# Patient Record
Sex: Female | Born: 1980 | Race: White | Hispanic: No | Marital: Single | Smoking: Current some day smoker
Health system: Southern US, Community
[De-identification: ages and names within clinical notes are randomized; demographics above are authoritative.]

## PROBLEM LIST (undated history)

## (undated) DIAGNOSIS — K219 Gastro-esophageal reflux disease without esophagitis: Secondary | ICD-10-CM

## (undated) DIAGNOSIS — F419 Anxiety disorder, unspecified: Secondary | ICD-10-CM

## (undated) DIAGNOSIS — F32A Depression, unspecified: Secondary | ICD-10-CM

## (undated) DIAGNOSIS — J45909 Unspecified asthma, uncomplicated: Secondary | ICD-10-CM

## (undated) HISTORY — DX: Depression, unspecified: F32.A

## (undated) HISTORY — DX: Anxiety disorder, unspecified: F41.9

---

## 2006-05-31 ENCOUNTER — Emergency Department (HOSPITAL_COMMUNITY): Admission: EM | Admit: 2006-05-31 | Discharge: 2006-05-31 | Payer: Self-pay | Admitting: Emergency Medicine

## 2006-12-09 ENCOUNTER — Other Ambulatory Visit: Admission: RE | Admit: 2006-12-09 | Discharge: 2006-12-09 | Payer: Self-pay | Admitting: *Deleted

## 2008-06-26 ENCOUNTER — Emergency Department (HOSPITAL_COMMUNITY): Admission: EM | Admit: 2008-06-26 | Discharge: 2008-06-26 | Payer: Self-pay | Admitting: Emergency Medicine

## 2008-07-25 ENCOUNTER — Ambulatory Visit (HOSPITAL_COMMUNITY): Admission: RE | Admit: 2008-07-25 | Discharge: 2008-07-25 | Payer: Self-pay | Admitting: Chiropractic Medicine

## 2008-08-27 ENCOUNTER — Inpatient Hospital Stay (HOSPITAL_COMMUNITY): Admission: AD | Admit: 2008-08-27 | Discharge: 2008-08-27 | Payer: Self-pay | Admitting: Obstetrics & Gynecology

## 2008-08-29 ENCOUNTER — Inpatient Hospital Stay (HOSPITAL_COMMUNITY): Admission: AD | Admit: 2008-08-29 | Discharge: 2008-08-29 | Payer: Self-pay | Admitting: Obstetrics & Gynecology

## 2008-09-03 ENCOUNTER — Inpatient Hospital Stay (HOSPITAL_COMMUNITY): Admission: RE | Admit: 2008-09-03 | Discharge: 2008-09-03 | Payer: Self-pay | Admitting: Obstetrics & Gynecology

## 2008-09-25 ENCOUNTER — Emergency Department (HOSPITAL_COMMUNITY): Admission: EM | Admit: 2008-09-25 | Discharge: 2008-09-25 | Payer: Self-pay | Admitting: Emergency Medicine

## 2008-12-13 ENCOUNTER — Observation Stay (HOSPITAL_COMMUNITY): Admission: EM | Admit: 2008-12-13 | Discharge: 2008-12-14 | Payer: Self-pay | Admitting: Emergency Medicine

## 2008-12-13 ENCOUNTER — Ambulatory Visit: Payer: Self-pay | Admitting: Family Medicine

## 2008-12-13 ENCOUNTER — Encounter: Payer: Self-pay | Admitting: Internal Medicine

## 2008-12-13 LAB — CONVERTED CEMR LAB
BUN: 11 mg/dL
Basophils Relative: 0.2 %
CO2: 25 meq/L
Calcium: 8.6 mg/dL
Chloride: 104 meq/L
Creatinine, Ser: 0.55 mg/dL
Eosinophils Relative: 1.5 %
Glucose, Bld: 78 mg/dL
HCT: 32.7 %
Hemoglobin: 11.1 g/dL
Lymphocytes, automated: 3.6 %
MCV: 92.7 fL
Monocytes Relative: 0.8 %
Neutrophils Relative %: 12.9 %
Platelets: 310 10*3/uL
Potassium: 4.1 meq/L
RBC: 3.53 M/uL
RDW: 12.7 %
Sodium: 133 meq/L
WBC: 19.1 10*3/uL

## 2008-12-14 ENCOUNTER — Encounter: Payer: Self-pay | Admitting: Internal Medicine

## 2008-12-14 LAB — CONVERTED CEMR LAB
BUN: 3 mg/dL
CO2: 21 meq/L
Calcium: 8.4 mg/dL
Chloride: 109 meq/L
Creatinine, Ser: 0.49 mg/dL
Glucose, Bld: 153 mg/dL
HCT: 29.7 %
Hemoglobin: 10.1 g/dL
MCV: 92.4 fL
Platelets: 241 10*3/uL
Potassium: 3.7 meq/L
RBC: 3.22 M/uL
RDW: 12.7 %
Sodium: 136 meq/L
WBC: 20.8 10*3/uL

## 2009-01-02 ENCOUNTER — Encounter: Payer: Self-pay | Admitting: Internal Medicine

## 2009-01-02 DIAGNOSIS — M545 Low back pain, unspecified: Secondary | ICD-10-CM | POA: Insufficient documentation

## 2009-01-02 DIAGNOSIS — J45909 Unspecified asthma, uncomplicated: Secondary | ICD-10-CM | POA: Insufficient documentation

## 2009-03-16 ENCOUNTER — Inpatient Hospital Stay (HOSPITAL_COMMUNITY): Admission: AD | Admit: 2009-03-16 | Discharge: 2009-03-17 | Payer: Self-pay | Admitting: Obstetrics & Gynecology

## 2009-03-16 ENCOUNTER — Encounter: Payer: Self-pay | Admitting: Internal Medicine

## 2009-03-16 LAB — CONVERTED CEMR LAB
ALT: 15 units/L
AST: 21 units/L
Albumin: 2.6 g/dL
Alkaline Phosphatase: 74 units/L
BUN: 5 mg/dL
CO2: 22 meq/L
Calcium: 8.2 mg/dL
Chloride: 107 meq/L
Creatinine, Ser: 0.47 mg/dL
Glucose, Bld: 75 mg/dL
HCT: 31.2 %
Hemoglobin: 10.6 g/dL
MCV: 92.4 fL
Platelets: 159 10*3/uL
Potassium: 3.4 meq/L
RBC: 3.38 M/uL
RDW: 13.7 %
Sodium: 136 meq/L
Total Bilirubin: 0.2 mg/dL
Total Protein: 5.4 g/dL
WBC: 9.8 10*3/uL

## 2009-03-22 ENCOUNTER — Encounter: Payer: Self-pay | Admitting: Internal Medicine

## 2009-03-22 DIAGNOSIS — F411 Generalized anxiety disorder: Secondary | ICD-10-CM | POA: Insufficient documentation

## 2009-03-25 ENCOUNTER — Ambulatory Visit: Payer: Self-pay | Admitting: Internal Medicine

## 2009-04-08 ENCOUNTER — Inpatient Hospital Stay (HOSPITAL_COMMUNITY): Admission: AD | Admit: 2009-04-08 | Discharge: 2009-04-12 | Payer: Self-pay | Admitting: Obstetrics and Gynecology

## 2009-04-15 ENCOUNTER — Ambulatory Visit: Payer: Self-pay | Admitting: Internal Medicine

## 2009-04-15 ENCOUNTER — Telehealth: Payer: Self-pay | Admitting: Internal Medicine

## 2009-07-02 ENCOUNTER — Ambulatory Visit (HOSPITAL_COMMUNITY): Admission: RE | Admit: 2009-07-02 | Discharge: 2009-07-02 | Payer: Self-pay | Admitting: Obstetrics and Gynecology

## 2010-01-21 ENCOUNTER — Emergency Department (HOSPITAL_COMMUNITY): Admission: EM | Admit: 2010-01-21 | Discharge: 2010-01-22 | Payer: Self-pay | Admitting: Emergency Medicine

## 2010-05-13 NOTE — Progress Notes (Signed)
Summary: NO-SHOW X'3  ---- Converted from flag ---- ---- 04/15/2009 2:26 PM, Hilarie Fredrickson wrote: I NOTED IT IN [facility] (PO6 SCREEN) NOT TO SCHEDULE IN PRIMARY CARE.    ---- 04/15/2009 1:57 PM, Newt Lukes MD wrote: i don't know what the rule is but....  looks to me like this pt will be no show x 3 different days if she doens't make it here soon -  let's not reschedule her again, ok? - thanks ------------------------------

## 2010-07-14 LAB — CBC
HCT: 28.2 % — ABNORMAL LOW (ref 36.0–46.0)
HCT: 33.9 % — ABNORMAL LOW (ref 36.0–46.0)
Hemoglobin: 11.3 g/dL — ABNORMAL LOW (ref 12.0–15.0)
Hemoglobin: 9.6 g/dL — ABNORMAL LOW (ref 12.0–15.0)
MCHC: 33.3 g/dL (ref 30.0–36.0)
MCHC: 34 g/dL (ref 30.0–36.0)
MCV: 91.4 fL (ref 78.0–100.0)
MCV: 92.2 fL (ref 78.0–100.0)
Platelets: 152 10*3/uL (ref 150–400)
Platelets: 197 10*3/uL (ref 150–400)
RBC: 3.06 MIL/uL — ABNORMAL LOW (ref 3.87–5.11)
RBC: 3.71 MIL/uL — ABNORMAL LOW (ref 3.87–5.11)
RDW: 13.2 % (ref 11.5–15.5)
RDW: 13.4 % (ref 11.5–15.5)
WBC: 11 10*3/uL — ABNORMAL HIGH (ref 4.0–10.5)
WBC: 12.3 10*3/uL — ABNORMAL HIGH (ref 4.0–10.5)

## 2010-07-14 LAB — RPR: RPR Ser Ql: NONREACTIVE

## 2010-07-15 LAB — CBC
HCT: 31.2 % — ABNORMAL LOW (ref 36.0–46.0)
Hemoglobin: 10.6 g/dL — ABNORMAL LOW (ref 12.0–15.0)
MCHC: 33.8 g/dL (ref 30.0–36.0)
MCV: 92.4 fL (ref 78.0–100.0)
Platelets: 159 10*3/uL (ref 150–400)
RBC: 3.38 MIL/uL — ABNORMAL LOW (ref 3.87–5.11)
RDW: 13.7 % (ref 11.5–15.5)
WBC: 9.8 10*3/uL (ref 4.0–10.5)

## 2010-07-15 LAB — WET PREP, GENITAL
Clue Cells Wet Prep HPF POC: NONE SEEN
Trich, Wet Prep: NONE SEEN
Yeast Wet Prep HPF POC: NONE SEEN

## 2010-07-15 LAB — COMPREHENSIVE METABOLIC PANEL
ALT: 15 U/L (ref 0–35)
AST: 21 U/L (ref 0–37)
Albumin: 2.6 g/dL — ABNORMAL LOW (ref 3.5–5.2)
Alkaline Phosphatase: 74 U/L (ref 39–117)
BUN: 5 mg/dL — ABNORMAL LOW (ref 6–23)
CO2: 22 mEq/L (ref 19–32)
Calcium: 8.2 mg/dL — ABNORMAL LOW (ref 8.4–10.5)
Chloride: 107 mEq/L (ref 96–112)
Creatinine, Ser: 0.47 mg/dL (ref 0.4–1.2)
GFR calc Af Amer: >60 mL/min (ref >60)
GFR calc non Af Amer: >60 mL/min (ref >60)
Glucose, Bld: 75 mg/dL (ref 70–99)
Potassium: 3.4 mEq/L — ABNORMAL LOW (ref 3.5–5.1)
Sodium: 136 mEq/L (ref 135–145)
Total Bilirubin: 0.2 mg/dL — ABNORMAL LOW (ref 0.3–1.2)
Total Protein: 5.4 g/dL — ABNORMAL LOW (ref 6.0–8.3)

## 2010-07-15 LAB — URINALYSIS, ROUTINE W REFLEX MICROSCOPIC
Bilirubin Urine: NEGATIVE
Glucose, UA: NEGATIVE mg/dL
Hgb urine dipstick: NEGATIVE
Ketones, ur: NEGATIVE mg/dL
Nitrite: NEGATIVE
Protein, ur: NEGATIVE mg/dL
Specific Gravity, Urine: 1.015 (ref 1.005–1.030)
Urobilinogen, UA: 0.2 mg/dL (ref 0.0–1.0)
pH: 7 (ref 5.0–8.0)

## 2010-07-15 LAB — LACTATE DEHYDROGENASE: LDH: 151 U/L (ref 94–250)

## 2010-07-15 LAB — URINE CULTURE
Colony Count: NO GROWTH
Culture: NO GROWTH

## 2010-07-15 LAB — URINE MICROSCOPIC-ADD ON: RBC / HPF: NONE SEEN RBC/hpf (ref <3)

## 2010-07-15 LAB — URIC ACID: Uric Acid, Serum: 4.9 mg/dL (ref 2.4–7.0)

## 2010-07-18 LAB — CBC
HCT: 29.7 % — ABNORMAL LOW (ref 36.0–46.0)
HCT: 32.7 % — ABNORMAL LOW (ref 36.0–46.0)
Hemoglobin: 10.1 g/dL — ABNORMAL LOW (ref 12.0–15.0)
Hemoglobin: 11.1 g/dL — ABNORMAL LOW (ref 12.0–15.0)
MCHC: 33.8 g/dL (ref 30.0–36.0)
MCHC: 34 g/dL (ref 30.0–36.0)
MCV: 92.4 fL (ref 78.0–100.0)
MCV: 92.7 fL (ref 78.0–100.0)
Platelets: 241 10*3/uL (ref 150–400)
Platelets: 310 10*3/uL (ref 150–400)
RBC: 3.22 MIL/uL — ABNORMAL LOW (ref 3.87–5.11)
RBC: 3.53 MIL/uL — ABNORMAL LOW (ref 3.87–5.11)
RDW: 12.7 % (ref 11.5–15.5)
RDW: 12.7 % (ref 11.5–15.5)
WBC: 19.1 10*3/uL — ABNORMAL HIGH (ref 4.0–10.5)
WBC: 20.8 10*3/uL — ABNORMAL HIGH (ref 4.0–10.5)

## 2010-07-18 LAB — DIFFERENTIAL
Basophils Absolute: 0.2 10*3/uL — ABNORMAL HIGH (ref 0.0–0.1)
Basophils Relative: 1 % (ref 0–1)
Eosinophils Absolute: 1.5 10*3/uL — ABNORMAL HIGH (ref 0.0–0.7)
Eosinophils Relative: 8 % — ABNORMAL HIGH (ref 0–5)
Lymphocytes Relative: 19 % (ref 12–46)
Lymphs Abs: 3.6 10*3/uL (ref 0.7–4.0)
Monocytes Absolute: 0.8 10*3/uL (ref 0.1–1.0)
Monocytes Relative: 4 % (ref 3–12)
Neutro Abs: 12.9 10*3/uL — ABNORMAL HIGH (ref 1.7–7.7)
Neutrophils Relative %: 68 % (ref 43–77)

## 2010-07-18 LAB — BASIC METABOLIC PANEL
BUN: 11 mg/dL (ref 6–23)
BUN: 3 mg/dL — ABNORMAL LOW (ref 6–23)
CO2: 21 mEq/L (ref 19–32)
CO2: 25 mEq/L (ref 19–32)
Calcium: 8.4 mg/dL (ref 8.4–10.5)
Calcium: 8.6 mg/dL (ref 8.4–10.5)
Chloride: 104 mEq/L (ref 96–112)
Chloride: 109 mEq/L (ref 96–112)
Creatinine, Ser: 0.49 mg/dL (ref 0.4–1.2)
Creatinine, Ser: 0.55 mg/dL (ref 0.4–1.2)
GFR calc Af Amer: >60 mL/min (ref >60)
GFR calc Af Amer: >60 mL/min (ref >60)
GFR calc non Af Amer: >60 mL/min (ref >60)
GFR calc non Af Amer: >60 mL/min (ref >60)
Glucose, Bld: 153 mg/dL — ABNORMAL HIGH (ref 70–99)
Glucose, Bld: 78 mg/dL (ref 70–99)
Potassium: 3.7 mEq/L (ref 3.5–5.1)
Potassium: 4.1 mEq/L (ref 3.5–5.1)
Sodium: 133 mEq/L — ABNORMAL LOW (ref 135–145)
Sodium: 136 mEq/L (ref 135–145)

## 2010-07-22 LAB — CBC
HCT: 36.9 % (ref 36.0–46.0)
Hemoglobin: 13 g/dL (ref 12.0–15.0)
MCHC: 35.2 g/dL (ref 30.0–36.0)
MCV: 92.2 fL (ref 78.0–100.0)
Platelets: 259 10*3/uL (ref 150–400)
RBC: 4 MIL/uL (ref 3.87–5.11)
RDW: 12.2 % (ref 11.5–15.5)
WBC: 9.4 10*3/uL (ref 4.0–10.5)

## 2010-07-22 LAB — URINALYSIS, ROUTINE W REFLEX MICROSCOPIC
Bilirubin Urine: NEGATIVE
Glucose, UA: NEGATIVE mg/dL
Hgb urine dipstick: NEGATIVE
Ketones, ur: NEGATIVE mg/dL
Nitrite: NEGATIVE
Protein, ur: NEGATIVE mg/dL
Specific Gravity, Urine: <1.005 — ABNORMAL LOW (ref 1.005–1.030)
Urobilinogen, UA: 0.2 mg/dL (ref 0.0–1.0)
pH: 6 (ref 5.0–8.0)

## 2010-07-22 LAB — WET PREP, GENITAL
Clue Cells Wet Prep HPF POC: NONE SEEN
Trich, Wet Prep: NONE SEEN
Yeast Wet Prep HPF POC: NONE SEEN

## 2010-07-22 LAB — ABO/RH: ABO/RH(D): AB POS

## 2010-07-22 LAB — HCG, QUANTITATIVE, PREGNANCY
hCG, Beta Chain, Quant, S: 1238 m[IU]/mL — ABNORMAL HIGH (ref <5)
hCG, Beta Chain, Quant, S: 2764 m[IU]/mL — ABNORMAL HIGH (ref <5)

## 2010-07-22 LAB — GC/CHLAMYDIA PROBE AMP, GENITAL
Chlamydia, DNA Probe: NEGATIVE
GC Probe Amp, Genital: NEGATIVE

## 2010-08-15 ENCOUNTER — Inpatient Hospital Stay (HOSPITAL_COMMUNITY)
Admission: EM | Admit: 2010-08-15 | Discharge: 2010-08-19 | DRG: 202 | Disposition: A | Discharge status: Home / Self Care | Payer: Medicaid Other | Source: Ambulatory Visit | Attending: Internal Medicine | Admitting: Internal Medicine

## 2010-08-15 ENCOUNTER — Emergency Department (HOSPITAL_COMMUNITY): Payer: Medicaid Other

## 2010-08-15 DIAGNOSIS — E872 Acidosis, unspecified: Secondary | ICD-10-CM | POA: Diagnosis present

## 2010-08-15 DIAGNOSIS — Z91010 Allergy to peanuts: Secondary | ICD-10-CM

## 2010-08-15 DIAGNOSIS — F1210 Cannabis abuse, uncomplicated: Secondary | ICD-10-CM | POA: Diagnosis present

## 2010-08-15 DIAGNOSIS — M545 Low back pain, unspecified: Secondary | ICD-10-CM | POA: Diagnosis present

## 2010-08-15 DIAGNOSIS — J209 Acute bronchitis, unspecified: Secondary | ICD-10-CM | POA: Diagnosis present

## 2010-08-15 DIAGNOSIS — F411 Generalized anxiety disorder: Secondary | ICD-10-CM | POA: Diagnosis present

## 2010-08-15 DIAGNOSIS — G8929 Other chronic pain: Secondary | ICD-10-CM | POA: Diagnosis present

## 2010-08-15 DIAGNOSIS — J45901 Unspecified asthma with (acute) exacerbation: Principal | ICD-10-CM | POA: Diagnosis present

## 2010-08-15 DIAGNOSIS — Z880 Allergy status to penicillin: Secondary | ICD-10-CM

## 2010-08-15 DIAGNOSIS — E860 Dehydration: Secondary | ICD-10-CM | POA: Diagnosis present

## 2010-08-15 DIAGNOSIS — J309 Allergic rhinitis, unspecified: Secondary | ICD-10-CM | POA: Diagnosis present

## 2010-08-15 DIAGNOSIS — D72829 Elevated white blood cell count, unspecified: Secondary | ICD-10-CM | POA: Diagnosis present

## 2010-08-15 DIAGNOSIS — F1410 Cocaine abuse, uncomplicated: Secondary | ICD-10-CM | POA: Diagnosis present

## 2010-08-15 LAB — RAPID URINE DRUG SCREEN, HOSP PERFORMED
Amphetamines: NOT DETECTED
Barbiturates: NOT DETECTED
Benzodiazepines: NOT DETECTED
Cocaine: POSITIVE — AB
Opiates: NOT DETECTED
Tetrahydrocannabinol: POSITIVE — AB

## 2010-08-15 LAB — URINALYSIS, ROUTINE W REFLEX MICROSCOPIC
Bilirubin Urine: NEGATIVE
Glucose, UA: 250 mg/dL — AB
Hgb urine dipstick: NEGATIVE
Ketones, ur: NEGATIVE mg/dL
Nitrite: NEGATIVE
Protein, ur: NEGATIVE mg/dL
Specific Gravity, Urine: 1.009 (ref 1.005–1.030)
Urobilinogen, UA: 0.2 mg/dL (ref 0.0–1.0)
pH: 7 (ref 5.0–8.0)

## 2010-08-15 LAB — GLUCOSE, CAPILLARY: Glucose-Capillary: 148 mg/dL — ABNORMAL HIGH (ref 70–99)

## 2010-08-15 LAB — DIFFERENTIAL
Basophils Absolute: 0 10*3/uL (ref 0.0–0.1)
Basophils Relative: 0 % (ref 0–1)
Eosinophils Absolute: 2.9 10*3/uL — ABNORMAL HIGH (ref 0.0–0.7)
Eosinophils Relative: 16 % — ABNORMAL HIGH (ref 0–5)
Lymphocytes Relative: 45 % (ref 12–46)
Lymphs Abs: 8.2 10*3/uL — ABNORMAL HIGH (ref 0.7–4.0)
Monocytes Absolute: 1.1 10*3/uL — ABNORMAL HIGH (ref 0.1–1.0)
Monocytes Relative: 6 % (ref 3–12)
Neutro Abs: 6 10*3/uL (ref 1.7–7.7)
Neutrophils Relative %: 33 % — ABNORMAL LOW (ref 43–77)

## 2010-08-15 LAB — CK TOTAL AND CKMB (NOT AT ARMC)
CK, MB: 5.7 ng/mL — ABNORMAL HIGH (ref 0.3–4.0)
Relative Index: 0.3 (ref 0.0–2.5)
Total CK: 1950 U/L — ABNORMAL HIGH (ref 7–177)

## 2010-08-15 LAB — POCT I-STAT 3, ART BLOOD GAS (G3+)
Acid-base deficit: 4 mmol/L — ABNORMAL HIGH (ref 0.0–2.0)
Bicarbonate: 22.5 mEq/L (ref 20.0–24.0)
O2 Saturation: 98 %
TCO2: 24 mmol/L (ref 0–100)
pCO2 arterial: 47 mmHg — ABNORMAL HIGH (ref 35.0–45.0)
pH, Arterial: 7.287 — ABNORMAL LOW (ref 7.350–7.400)
pO2, Arterial: 129 mmHg — ABNORMAL HIGH (ref 80.0–100.0)

## 2010-08-15 LAB — TSH: TSH: 0.607 u[IU]/mL (ref 0.350–4.500)

## 2010-08-15 LAB — POCT CARDIAC MARKERS
CKMB, poc: 2.3 ng/mL (ref 1.0–8.0)
Myoglobin, poc: >500 ng/mL (ref 12–200)
Troponin i, poc: <0.05 ng/mL (ref 0.00–0.09)

## 2010-08-15 LAB — BASIC METABOLIC PANEL
BUN: 8 mg/dL (ref 6–23)
CO2: 21 mEq/L (ref 19–32)
Calcium: 8.8 mg/dL (ref 8.4–10.5)
Chloride: 105 mEq/L (ref 96–112)
Creatinine, Ser: 0.75 mg/dL (ref 0.4–1.2)
GFR calc Af Amer: >60 mL/min (ref >60)
GFR calc non Af Amer: >60 mL/min (ref >60)
Glucose, Bld: 260 mg/dL — ABNORMAL HIGH (ref 70–99)
Potassium: 3.6 mEq/L (ref 3.5–5.1)
Sodium: 139 mEq/L (ref 135–145)

## 2010-08-15 LAB — CBC
HCT: 38.1 % (ref 36.0–46.0)
Hemoglobin: 12.5 g/dL (ref 12.0–15.0)
MCH: 29.8 pg (ref 26.0–34.0)
MCHC: 32.8 g/dL (ref 30.0–36.0)
MCV: 90.9 fL (ref 78.0–100.0)
Platelets: 413 10*3/uL — ABNORMAL HIGH (ref 150–400)
RBC: 4.19 MIL/uL (ref 3.87–5.11)
RDW: 13.1 % (ref 11.5–15.5)
WBC: 18.2 10*3/uL — ABNORMAL HIGH (ref 4.0–10.5)

## 2010-08-15 LAB — TROPONIN I: Troponin I: <0.3 ng/mL (ref <0.30)

## 2010-08-15 LAB — POCT PREGNANCY, URINE: Preg Test, Ur: NEGATIVE

## 2010-08-15 LAB — HEMOGLOBIN A1C
Hgb A1c MFr Bld: 4.9 % (ref <5.7)
Mean Plasma Glucose: 94 mg/dL (ref <117)

## 2010-08-15 LAB — PRO B NATRIURETIC PEPTIDE
Pro B Natriuretic peptide (BNP): 284.6 pg/mL — ABNORMAL HIGH (ref 0–125)
Pro B Natriuretic peptide (BNP): 312.1 pg/mL — ABNORMAL HIGH (ref 0–125)

## 2010-08-16 ENCOUNTER — Inpatient Hospital Stay (HOSPITAL_COMMUNITY): Payer: Medicaid Other

## 2010-08-16 LAB — CBC
HCT: 35.6 % — ABNORMAL LOW (ref 36.0–46.0)
Hemoglobin: 11.6 g/dL — ABNORMAL LOW (ref 12.0–15.0)
MCH: 29.5 pg (ref 26.0–34.0)
MCHC: 32.6 g/dL (ref 30.0–36.0)
MCV: 90.6 fL (ref 78.0–100.0)
Platelets: 285 10*3/uL (ref 150–400)
RBC: 3.93 MIL/uL (ref 3.87–5.11)
RDW: 13.2 % (ref 11.5–15.5)
WBC: 24.4 10*3/uL — ABNORMAL HIGH (ref 4.0–10.5)

## 2010-08-16 LAB — COMPREHENSIVE METABOLIC PANEL
ALT: 22 U/L (ref 0–35)
AST: 44 U/L — ABNORMAL HIGH (ref 0–37)
Albumin: 3.6 g/dL (ref 3.5–5.2)
Alkaline Phosphatase: 59 U/L (ref 39–117)
BUN: 8 mg/dL (ref 6–23)
CO2: 23 mEq/L (ref 19–32)
Calcium: 9.2 mg/dL (ref 8.4–10.5)
Chloride: 107 mEq/L (ref 96–112)
Creatinine, Ser: <0.47 mg/dL (ref 0.4–1.2)
Glucose, Bld: 141 mg/dL — ABNORMAL HIGH (ref 70–99)
Potassium: 4.3 mEq/L (ref 3.5–5.1)
Sodium: 139 mEq/L (ref 135–145)
Total Bilirubin: 0.2 mg/dL — ABNORMAL LOW (ref 0.3–1.2)
Total Protein: 6.7 g/dL (ref 6.0–8.3)

## 2010-08-16 LAB — GLUCOSE, CAPILLARY
Glucose-Capillary: 91 mg/dL (ref 70–99)
Glucose-Capillary: 153 mg/dL — ABNORMAL HIGH (ref 70–99)
Glucose-Capillary: 155 mg/dL — ABNORMAL HIGH (ref 70–99)
Glucose-Capillary: 151 mg/dL — ABNORMAL HIGH (ref 70–99)

## 2010-08-16 LAB — DIFFERENTIAL
Basophils Absolute: 0 10*3/uL (ref 0.0–0.1)
Basophils Relative: 0 % (ref 0–1)
Eosinophils Absolute: 0 10*3/uL (ref 0.0–0.7)
Eosinophils Relative: 0 % (ref 0–5)
Lymphocytes Relative: 4 % — ABNORMAL LOW (ref 12–46)
Lymphs Abs: 1.1 10*3/uL (ref 0.7–4.0)
Monocytes Absolute: 0.4 10*3/uL (ref 0.1–1.0)
Monocytes Relative: 2 % — ABNORMAL LOW (ref 3–12)
Neutro Abs: 22.9 10*3/uL — ABNORMAL HIGH (ref 1.7–7.7)
Neutrophils Relative %: 94 % — ABNORMAL HIGH (ref 43–77)

## 2010-08-16 LAB — CARDIAC PANEL(CRET KIN+CKTOT+MB+TROPI)
CK, MB: 6.1 ng/mL (ref 0.3–4.0)
Relative Index: 0.4 (ref 0.0–2.5)
Total CK: 1728 U/L — ABNORMAL HIGH (ref 7–177)
Troponin I: <0.3 ng/mL (ref <0.30)

## 2010-08-16 LAB — MAGNESIUM: Magnesium: 2.1 mg/dL (ref 1.5–2.5)

## 2010-08-17 LAB — GLUCOSE, CAPILLARY
Glucose-Capillary: 160 mg/dL — ABNORMAL HIGH (ref 70–99)
Glucose-Capillary: 146 mg/dL — ABNORMAL HIGH (ref 70–99)
Glucose-Capillary: 147 mg/dL — ABNORMAL HIGH (ref 70–99)
Glucose-Capillary: 111 mg/dL — ABNORMAL HIGH (ref 70–99)
Glucose-Capillary: 103 mg/dL — ABNORMAL HIGH (ref 70–99)

## 2010-08-17 LAB — DIFFERENTIAL
Basophils Absolute: 0 K/uL (ref 0.0–0.1)
Basophils Relative: 0 % (ref 0–1)
Eosinophils Absolute: 0 K/uL (ref 0.0–0.7)
Eosinophils Relative: 0 % (ref 0–5)
Lymphocytes Relative: 6 % — ABNORMAL LOW (ref 12–46)
Lymphs Abs: 1.2 K/uL (ref 0.7–4.0)
Monocytes Absolute: 0.5 K/uL (ref 0.1–1.0)
Monocytes Relative: 2 % — ABNORMAL LOW (ref 3–12)
Neutro Abs: 18.1 K/uL — ABNORMAL HIGH (ref 1.7–7.7)
Neutrophils Relative %: 91 % — ABNORMAL HIGH (ref 43–77)

## 2010-08-17 LAB — COMPREHENSIVE METABOLIC PANEL WITH GFR
ALT: 19 U/L (ref 0–35)
AST: 21 U/L (ref 0–37)
Albumin: 3.3 g/dL — ABNORMAL LOW (ref 3.5–5.2)
Alkaline Phosphatase: 46 U/L (ref 39–117)
BUN: 15 mg/dL (ref 6–23)
CO2: 23 meq/L (ref 19–32)
Calcium: 9.1 mg/dL (ref 8.4–10.5)
Chloride: 107 meq/L (ref 96–112)
Creatinine, Ser: <0.47 mg/dL (ref 0.4–1.2)
Glucose, Bld: 137 mg/dL — ABNORMAL HIGH (ref 70–99)
Potassium: 4.3 meq/L (ref 3.5–5.1)
Sodium: 137 meq/L (ref 135–145)
Total Bilirubin: 0.1 mg/dL — ABNORMAL LOW (ref 0.3–1.2)
Total Protein: 6.2 g/dL (ref 6.0–8.3)

## 2010-08-17 LAB — CBC
HCT: 33.5 % — ABNORMAL LOW (ref 36.0–46.0)
Hemoglobin: 11.1 g/dL — ABNORMAL LOW (ref 12.0–15.0)
MCH: 30.4 pg (ref 26.0–34.0)
MCHC: 33.1 g/dL (ref 30.0–36.0)
MCV: 91.8 fL (ref 78.0–100.0)
Platelets: 271 10*3/uL (ref 150–400)
RBC: 3.65 MIL/uL — ABNORMAL LOW (ref 3.87–5.11)
RDW: 13.4 % (ref 11.5–15.5)
WBC: 19.8 10*3/uL — ABNORMAL HIGH (ref 4.0–10.5)

## 2010-08-17 LAB — CARDIAC PANEL(CRET KIN+CKTOT+MB+TROPI)
CK, MB: 2.7 ng/mL (ref 0.3–4.0)
Relative Index: 0.8 (ref 0.0–2.5)
Total CK: 346 U/L — ABNORMAL HIGH (ref 7–177)

## 2010-08-17 LAB — HEMOGLOBIN A1C
Hgb A1c MFr Bld: 5 % (ref <5.7)
Mean Plasma Glucose: 97 mg/dL (ref <117)

## 2010-08-17 LAB — MAGNESIUM: Magnesium: 2 mg/dL (ref 1.5–2.5)

## 2010-08-18 LAB — CBC
HCT: 33.6 % — ABNORMAL LOW (ref 36.0–46.0)
Hemoglobin: 11 g/dL — ABNORMAL LOW (ref 12.0–15.0)
MCH: 30 pg (ref 26.0–34.0)
MCHC: 32.7 g/dL (ref 30.0–36.0)
MCV: 91.6 fL (ref 78.0–100.0)
Platelets: 295 10*3/uL (ref 150–400)
RBC: 3.67 MIL/uL — ABNORMAL LOW (ref 3.87–5.11)
RDW: 13.3 % (ref 11.5–15.5)
WBC: 13.5 10*3/uL — ABNORMAL HIGH (ref 4.0–10.5)

## 2010-08-18 LAB — GLUCOSE, CAPILLARY
Glucose-Capillary: 128 mg/dL — ABNORMAL HIGH (ref 70–99)
Glucose-Capillary: 121 mg/dL — ABNORMAL HIGH (ref 70–99)
Glucose-Capillary: 180 mg/dL — ABNORMAL HIGH (ref 70–99)

## 2010-08-19 LAB — GLUCOSE, CAPILLARY
Glucose-Capillary: 144 mg/dL — ABNORMAL HIGH (ref 70–99)
Glucose-Capillary: 128 mg/dL — ABNORMAL HIGH (ref 70–99)
Glucose-Capillary: 171 mg/dL — ABNORMAL HIGH (ref 70–99)
Glucose-Capillary: 148 mg/dL — ABNORMAL HIGH (ref 70–99)

## 2010-08-24 NOTE — H&P (Signed)
Vicki Moore, Vicki Moore            ACCOUNT NO.:  [id number]  MEDICAL RECORD NO.:  [id number]           PATIENT TYPE:  E  LOCATION:  [facility]                         FACILITY:  [facility]  PHYSICIAN:  Eduard Clos, MDDATE OF BIRTH:  12-15-1980  DATE OF ADMISSION:  08/15/2010 DATE OF DISCHARGE:                             HISTORY & PHYSICAL   PRIMARY CARE PHYSICIAN:  Unassigned.  CHIEF COMPLAINT:  Shortness of breath.  HISTORY OF PRESENT ILLNESS:  A 30 year old female with known history of bronchial asthma since age 56, woke up in the middle of the night as history provided by the patient and her boyfriend feeling hungry and symptoms of short of breath.  The patient started using some nebulizer and her boyfriend went to get some sandwich when he came back within 15 minutes, the patient was found to be very short of breath, cyanotic, he called the EMS immediately and he told that the patient is not making any definite respiration and he was not sure if she ever had any pulse. EMS told him to start CPR.  He just gave one compression and he gave couple of mouth-to-mouth breathing.  By the time EMS reached there, they were both intubated, but she was making good respiration, but still cyanotic.  They had back them and brought to the ER.  In the ER, the patient was given epinephrine, nebulizers, steroids and the patient at this time has become much better, talking in complete sentences without any difficulty though still short of breath.  At this time, the patient denies any chest pain.  Denies any headache or visual symptoms.  Has no focal deficit.  Does not have any abdominal pain, dysuria, discharge or diarrhea.  The patient does have chronic low back pain for which the patient states she takes Percocet 10/325 four times daily.  Her drug screen is negative for any opiates, is positive for cocaine.  PAST MEDICAL HISTORY: 1. History of bronchial asthma. 2. Anxiety. 3. History of  depression, presently the patient has no depression     symptoms or any suicidal ideation. 4. Allergic rhinitis.  MEDICATIONS ON ADMISSION:  As per the patient, the patient is taking; 1. Advair Diskus 250/50 one puff twice daily. 2. Albuterol inhaler p.r.n. 3. She takes Claritin whenever she gets allergies. 4. She states she takes Percocet 10/325 p.o. 4 times daily p.r.n. for     pain, low back.  Her drug screen is negative.  FAMILY HISTORY:  Positive for bronchial asthma and diabetes mellitus in her grandparents.  SOCIAL HISTORY:  The patient denies smoking cigarettes, drinks wine couple of times per week.  Denies drug abuse, but she is positive for cocaine and also marijuana.  ALLERGIES:  She is allergic to PENICILLIN, which causes itching, nausea and also to PEANUTS.  REVIEW OF SYSTEMS:  As per history of present illness, nothing else significant.  PHYSICAL EXAMINATION:  GENERAL:  The patient examined at the bedside, at this time is not in acute distress.  She is able to talk complete sentences. VITAL SIGNS:  Blood pressure 120/60, pulse is 120 per minute, temperature 97.5, respirations 18 per minute, O2  sat 100%. HEENT:  Anicteric.  No pallor.  No facial asymmetry.  Tongue midline. PERRLA positive.  No neck rigidity. CHEST:  Bilateral air entry present.  Bilateral executive wheeze heard. No crepitation.  There is no localized tenderness. HEART:  S1, S2 heard. ABDOMEN:  Soft, nontender.  Bowel sounds heard. CNS:  The patient is alert and oriented to time, place and person. Moves upper and lower extremities 5/5. EXTREMITIES:  Peripheral pulses felt.  There is no edema.  LABORATORY DATA:  EKG shows sinus tachycardia beats around 127 beats per minute with nonspecific ST-T changes, some ST depression in the lateral leads.  Chest x-ray shows no active cardiopulmonary disease.  CBC; WBC is 18.2, hemoglobin is 12.5, hematocrit 38.1, platelets 413, neutrophils 33%, eosinophils  16%.  Basic metabolic panel; sodium 139, potassium 3.6, chloride 105, carbon dioxide 21, glucose 260, BUN 8, creatinine 0.7, calcium 8.8.  Pregnancy screen is negative.  Drug screen is positive for cocaine.  Marijuana is positive.  Specifically opiates are negative.  ASSESSMENT: 1. Acute exacerbation of bronchial asthma. 2. Cocaine and marijuana abuse. 3. History of chronic low back pain. 4. History of allergic rhinitis.  PLAN: 1. At this time, we will admit the patient to telemetry. 2. For asthma exacerbation, the patient will be on nebulizer.  At this     time, we will use Xopenex because of significant tachycardia.  We     will also place the patient on Pulmicort nebulizer, Zithromax and     IV steroids and slowly change to p.o. prednisone. 3. Polysubstance abuse.  The patient needs substance abuse counseling. 4. We will need to verify her home medication.  The patient states     that she uses Percocet.  I do not see any opiates in the urine.  I     need to verify this. 5. At this time, the patient is cocaine positive.  There is some     nonspecific ST depressions in lateral leads.  Thought the patient     is chest pain free, I am ordering cyclic cardiac markers.  I will     also get a 2-D echo. 6. The patient does have some significant leukocytosis.  I think it is     may be from the stress from the asthma.  I will also check a UA.     For now, I am going to keep the patient on Zithromax.  Further     recommendation as condition evolves.     Eduard Clos, MD    ANK/MEDQ  D:  08/15/2010  T:  08/15/2010  Job:  [id number]  Electronically Signed by Midge Minium MD on 08/24/2010 08:37:20 AM

## 2010-08-26 NOTE — H&P (Signed)
Vicki Moore, Vicki Moore            ACCOUNT NO.:  [id number]   MEDICAL RECORD NO.:  [id number]          PATIENT TYPE:  OBV   LOCATION:  5529                         FACILITY:  [facility]   PHYSICIAN:  Leighton Roach McDiarmid, M.D.DATE OF BIRTH:  04/16/1980   DATE OF ADMISSION:  12/13/2008  DATE OF DISCHARGE:                              HISTORY & PHYSICAL   PRIMARY CARE PHYSICIAN:  Guy Sandifer. Henderson Cloud, MD of OB/GYN   CHIEF COMPLAINT:  Acute asthma exacerbation.   HISTORY OF PRESENT ILLNESS:  This is a 30 year old female with past  medical history significant for asthma who presents with acute asthma  exacerbation which started this morning.  The patient is also 20 weeks 5  days pregnant.  The patient notes that she began to have worsening  shortness of breath over the course of her pregnancy and had been using  her albuterol inhaler four times a day for much of her pregnancy.  The  patient acutely worsened over the past week when she ran out of her  fluticasone/salmeterol inhaler with worsening dyspnea, wheezing, cough  with green phlegm, subjective fever, runny nose.  The patient became  worse around 2:00 a.m. on the day of admission with a feeling of severe  dyspnea and chest tightness.   PAST MEDICAL HISTORY:  1. Asthma. Last ED visit in July 2010.  2. Pregnancy, G2, P 0-0-1-0 at 20 weeks 5 days estimated gestational      age.  Marginal placenta previa on ultrasound per the patient.  No      other complications per patient.   PAST SURGICAL HISTORY:  None.   MEDICATIONS:  1. Ventolin inhaler 2 puffs q.4 h. p.r.n. wheezing, dyspnea.  2. Seretide (European name) - fluticasone/salmeterol inhaler,      uncertain of dose, but takes 2 puffs b.i.d.   ALLERGIES:  1. PENICILLIN, nausea, vomiting, rash  2. PEANUTS, anaphylaxis.   FAMILY HISTORY:  1. Dad, asthma, Barrett esophagus.  2. Mom, Conn syndrome.  3. Maternal side of family, diabetes mellitus type 2.   REVIEW OF SYSTEMS:  As per HPI.   Also, the patient reports mild lower  extremity edema bilaterally, almost daily moderate headache partially  relieved by Tylenol, positive fetal movement, chronic back pain.  The  patient denies vaginal bleeding, dysuria, hematuria, right upper  quadrant pain, contractions, discharge, lower extremity pain.  Otherwise  balance of 12 systems is negative on review of systems.   SOCIAL HISTORY:  The patient lives in [place] with her fiance.  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  Fiance smokes.  The patient denies personal history of alcohol, tobacco, drug use.   PHYSICAL EXAMINATION:  VITALS:  Temperature 98.0, heart rate 105,  respiratory rate 22, O2 sats 98 on room air, blood pressure 94-120 over  52-72.  GENERAL:  Sitting up in bed, completing sentences easily on room air,  tired appearing but no acute distress.  HEAD:  NCAT.  EYES:  EOMI, PERRL, no conjunctivitis.  NOSE:  No rhinorrhea or congestion  MOUTH:  Moist mucous membranes.  No erythema or exudate or lesions.  NECK:  No JVD, no LAD, supple, full range of motion.  LUNGS:  Bilateral expiratory wheezes throughout all lung fields and  throughout respiration.  Normal work of breathing, no tachypnea.  CARDIOVASCULAR:  Tachycardia but regular rhythm, no murmurs or rubs.  ABDOMEN:  Gravid, positive bowel sounds, nontender, fetal heart rate at  160.  MUSCULOSKELETAL:  5/5 strength, bilateral upper and lower extremities.  NEURO:  Cranial nerves II-XII intact, alert and oriented.  EXTREMITIES:  No cyanosis or edema.   LABORATORY DATA:  BMET, sodium 133, potassium 4.1 chloride 104, bicarb  25, BUN 11, creatinine 0.55, glucose 78.  CBC: white blood cells 19.1,  hemoglobin 11.1, hematocrit 32.7, platelets 310.   ASSESSMENT/PLAN:  This is a 30 year old G2, P 0-0-1-0 at 20-5/7 weeks  estimated gestational age who presents with acute asthma exacerbation.  1. Asthma exacerbation.  We will continue  albuterol/Atrovent nebs      q.4.h., q.2 h. p.r.n..  We will continue prednisone 60 mg p.o.      daily.  Afebrile, white count elevated likely secondary to      demargination with steroids.  We will not start antibiotics at this      time.  The patient is status post albuterol/Atrovent nebs x3,      Xopenex x1, Solu-Medrol x1, prednisone 60 mg x1, magnesium x1.  The      patient is breathing comfortably on room air but is wheezing still.      We will admit to observation status with continuous pulse oximetry.  2. Pregnancy.  Fetal heart tracing in 160s.  ED PA discussed the case      with the patient's primary OB.  Dr. Henderson Cloud will follow.  Prerenal      vitamin daily.  3. Fluids, electrolytes, nutrition/Gastrointestinal:  Regular diet,      KVO IV fluids.  Prophylaxis.  Lovenox 40 mg p.o. daily for DVT      prophylaxis.   DISPOSITION:  Pending clinical improvement, possible discharge in the  morning.      Bobby Rumpf, MD  Electronically Signed      Leighton Roach McDiarmid, M.D.  Electronically Signed    KC/MEDQ  D:  12/13/2008  T:  12/14/2008  Job:  [id number]

## 2010-09-02 NOTE — Discharge Summary (Signed)
[name], Vicki Moore            ACCOUNT NO.:  [id number]  MEDICAL RECORD NO.:  [id number]           PATIENT TYPE:  I  LOCATION:  4737                         FACILITY:  [facility]  PHYSICIAN:  Clydia Llano, MD       DATE OF BIRTH:  1980-08-26  DATE OF ADMISSION:  08/15/2010 DATE OF DISCHARGE:  08/19/2010                              DISCHARGE SUMMARY   PRIMARY CARE PHYSICIAN:  Dr. Alden Benjamin, fellow in [facility] OB/GYN Department.  REASON FOR ADMISSION:  Shortness of breath.  DISCHARGE DIAGNOSES: 1. Acute exacerbation of asthma. 2. Acute bronchitis. 3. Anxiety. 4. History of allergic rhinitis. 5. Polysubstance abuse. 6. Chronic back pain.  DISCHARGE MEDICATIONS: 1. Azithromycin 500 mg p.o. daily take for 4 more days. 2. Prednisone taper. 3. Advair Diskus 1 puff inhaled b.i.d. 4. Albuterol inhaler 2 puffs inhaled every 4 hours as needed for     asthma symptoms. 5. Percocet 10/650 two tablets every 4 hours as needed for severe back     pain.  BRIEF HISTORY EXAMINATION:  [name] is a 30 year old female with a history of bronchial asthma since age 5, woke up in the middle of night, as history provided by the patient's boyfriend feeling hungry and symptoms of shortness of breath.  The patient symptoms of shortness of breath worsened to the point her boyfriend found her cyanotic and totally short of breath.  He started to do resuscitation on her and CPR. The patient was transferred by EMS to the hospital, was about to be intubated on her way to the hospital but the patient was fighting that. In the emergency department, she got epinephrine, nebulizer, steroids and at the time of admission evaluation, she was able to talk but she is still short of breath.  BRIEF HOSPITAL COURSE: 1. Acute asthma exacerbation.  The patient was admitted to the     hospital for further evaluation, started on IV antibiotics as well     as a bronchodilators, oxygen and mucolytics  were provided.  The     patient was improving slowly, still wheezy.  She is breathing on     room air without any difficulty.  Upon discharge, the patient     discharged on prednisone taper, Advair Diskus and Ventolin rescue     inhaler. 2. Acute bronchitis.  Treatment was same as in #1 plus antibiotics.     The patient was on Rocephin and azithromycin in the hospital.  The     patient will go home complete 7 days of erythromycin. 3. Polysubstance abuse.  The patient's urine drug test was positive     for cocaine and THC.  The patient said she does not use that.  The     puzzling thing is that patient should be on hydrocodone and her     urine drug test is negative for opiates.  The patient said she is     not using any street drugs regularly and she tried marijuana at one     of her friends birthday.  Counseling was provided. 4. Chronic back pain.  The patient on Percocet as needed.  No     prescription was given.  DISCHARGE INSTRUCTIONS: 1. Disposition home. 2. Diet regular 3. Activity as tolerated.     Clydia Llano, MD  ME/MEDQ  D:  08/19/2010  T:  08/20/2010  Job:  [id number]  cc:   Alden Benjamin  Electronically Signed by Clydia Llano  on 09/02/2010 03:21:40 PM

## 2010-09-16 ENCOUNTER — Ambulatory Visit: Payer: Medicaid Other | Admitting: Physical Therapy

## 2010-09-18 ENCOUNTER — Ambulatory Visit: Payer: Medicaid Other | Attending: Obstetrics and Gynecology | Admitting: Physical Therapy

## 2010-09-18 DIAGNOSIS — M545 Low back pain, unspecified: Secondary | ICD-10-CM | POA: Insufficient documentation

## 2010-09-18 DIAGNOSIS — M2560 Stiffness of unspecified joint, not elsewhere classified: Secondary | ICD-10-CM | POA: Insufficient documentation

## 2010-09-18 DIAGNOSIS — IMO0001 Reserved for inherently not codable concepts without codable children: Secondary | ICD-10-CM | POA: Insufficient documentation

## 2010-09-18 DIAGNOSIS — R5381 Other malaise: Secondary | ICD-10-CM | POA: Insufficient documentation

## 2010-10-02 ENCOUNTER — Encounter: Payer: Medicaid Other | Admitting: Physical Therapy

## 2010-10-16 ENCOUNTER — Encounter: Payer: Medicaid Other | Admitting: Physical Therapy

## 2010-10-24 ENCOUNTER — Inpatient Hospital Stay (INDEPENDENT_AMBULATORY_CARE_PROVIDER_SITE_OTHER)
Admission: RE | Admit: 2010-10-24 | Discharge: 2010-10-24 | Disposition: A | Discharge status: Home / Self Care | Payer: Medicaid Other | Source: Ambulatory Visit | Attending: Family Medicine | Admitting: Family Medicine

## 2010-10-24 ENCOUNTER — Ambulatory Visit (INDEPENDENT_AMBULATORY_CARE_PROVIDER_SITE_OTHER): Payer: Medicaid Other

## 2010-10-24 DIAGNOSIS — S20229A Contusion of unspecified back wall of thorax, initial encounter: Secondary | ICD-10-CM

## 2010-10-24 DIAGNOSIS — J45909 Unspecified asthma, uncomplicated: Secondary | ICD-10-CM

## 2010-10-24 DIAGNOSIS — M549 Dorsalgia, unspecified: Secondary | ICD-10-CM

## 2010-10-24 DIAGNOSIS — F988 Other specified behavioral and emotional disorders with onset usually occurring in childhood and adolescence: Secondary | ICD-10-CM

## 2010-10-30 ENCOUNTER — Encounter: Payer: Medicaid Other | Admitting: Physical Therapy

## 2011-03-18 ENCOUNTER — Emergency Department (HOSPITAL_COMMUNITY)
Admission: EM | Admit: 2011-03-18 | Discharge: 2011-03-18 | Disposition: A | Discharge status: Home / Self Care | Payer: Medicaid Other | Attending: Emergency Medicine | Admitting: Emergency Medicine

## 2011-03-18 ENCOUNTER — Encounter: Payer: Self-pay | Admitting: Family Medicine

## 2011-03-18 DIAGNOSIS — R0902 Hypoxemia: Secondary | ICD-10-CM | POA: Insufficient documentation

## 2011-03-18 DIAGNOSIS — R0602 Shortness of breath: Secondary | ICD-10-CM | POA: Insufficient documentation

## 2011-03-18 DIAGNOSIS — J45901 Unspecified asthma with (acute) exacerbation: Secondary | ICD-10-CM

## 2011-03-18 MED ORDER — ALBUTEROL SULFATE (5 MG/ML) 0.5% IN NEBU
INHALATION_SOLUTION | RESPIRATORY_TRACT | Status: AC
  Filled 2011-03-18: qty 1

## 2011-03-18 MED ORDER — ALBUTEROL (5 MG/ML) CONTINUOUS INHALATION SOLN: 15.0000 mg/h | INHALATION_SOLUTION | RESPIRATORY_TRACT | Status: AC

## 2011-03-18 MED ORDER — ALBUTEROL SULFATE (5 MG/ML) 0.5% IN NEBU
INHALATION_SOLUTION | RESPIRATORY_TRACT | Status: AC
  Filled 2011-03-18: qty 2

## 2011-03-18 MED ORDER — HYDROMORPHONE HCL PF 1 MG/ML IJ SOLN: 1.0000 mg | Freq: Once | INTRAMUSCULAR | Status: DC

## 2011-03-18 MED ORDER — PREDNISONE 20 MG PO TABS: 40.0000 mg | ORAL_TABLET | Freq: Every day | ORAL | Status: DC

## 2011-03-18 MED ORDER — HYDROMORPHONE HCL PF 2 MG/ML IJ SOLN
INTRAMUSCULAR | Status: AC
  Administered 2011-03-18: 1 mg
  Filled 2011-03-18: qty 1

## 2011-03-18 MED ORDER — IPRATROPIUM BROMIDE 0.02 % IN SOLN
RESPIRATORY_TRACT | Status: AC
  Administered 2011-03-18: 0.5 mg
  Filled 2011-03-18: qty 2.5

## 2011-03-18 MED ORDER — METHYLPREDNISOLONE SODIUM SUCC 125 MG IJ SOLR
INTRAMUSCULAR | Status: AC
  Filled 2011-03-18: qty 2

## 2011-03-18 MED ORDER — IPRATROPIUM BROMIDE 0.02 % IN SOLN
RESPIRATORY_TRACT | Status: AC
  Filled 2011-03-18: qty 2.5

## 2011-03-18 MED ORDER — ALBUTEROL SULFATE HFA 108 (90 BASE) MCG/ACT IN AERS
2.0000 | INHALATION_SPRAY | Freq: Once | RESPIRATORY_TRACT | Status: AC
  Administered 2011-03-18: 2 via RESPIRATORY_TRACT
  Filled 2011-03-18: qty 6.7

## 2011-03-18 NOTE — ED Notes (Signed)
QMV:[id number]<NG> Expected date:03/18/11<BR> Expected time: 1:45 PM<BR> Means of arrival:Ambulance<BR> Comments:<BR> EMS 12 GC - asthma attack/silent chest

## 2011-03-18 NOTE — ED Notes (Signed)
Pt alert and oriented x4. Respirations even and unlabored, bilateral symmetrical rise and fall of chest. Skin warm and dry. In no acute distress. Denies needs.   

## 2011-03-18 NOTE — ED Notes (Signed)
Per EMS pt with history of asthma had a asthma attack at 1300. 10 mg of Albuterol and 500 mcg of Atrovent and 125 mg IV of solumedrol  given by EMS. 20G in left AC.

## 2011-03-18 NOTE — ED Provider Notes (Signed)
History    30yF with dyspnea and hypoxia. hxof asthma and says feels similar to previous exacerbation. Began feeling wheezing yesterday. Symptoms much worse this morning. No fever or chills. No n/v. Chest tightness. Usual triggers are changes in weather, animal and mold exposure. uses advair and albuterol as needed. hx of chronic pain issues as well, otherwise healthy. Denies hx of blood clot. No unusual leg pain or swelling. Never been intubated for asthma.  CSN: [id number] Arrival date & time: 03/18/2011  1:58 PM   First MD Initiated Contact with Patient 03/18/11 1500      Chief Complaint  Patient presents with  . Asthma    (Consider location/radiation/quality/duration/timing/severity/associated sxs/prior treatment) HPI  No past medical history on file.  No past surgical history on file.  No family history on file.  History  Substance Use Topics  . Smoking status: Not on file  . Smokeless tobacco: Not on file  . Alcohol Use: Not on file    OB History    No data available      Review of Systems   Review of symptoms negative unless otherwise noted in HPI.   Allergies  Peanut-containing drug products and Penicillins  Home Medications   Current Outpatient Rx  Name Route Sig Dispense Refill  . ALBUTEROL SULFATE HFA 108 (90 BASE) MCG/ACT IN AERS Inhalation Inhale 2 puffs into the lungs every 6 (six) hours as needed. For shortness of breath     . FLUTICASONE-SALMETEROL 250-50 MCG/DOSE IN AEPB Inhalation Inhale 1 puff into the lungs every 12 (twelve) hours.      Carma Leaven M PLUS PO TABS Oral Take 1 tablet by mouth daily.      . OXYCODONE-ACETAMINOPHEN 10-325 MG PO TABS Oral Take 2 tablets by mouth every 4 (four) hours.        BP 103/60  Pulse 126  Resp 14  SpO2 100%  Physical Exam  Nursing note and vitals reviewed. Constitutional: She appears well-developed and well-nourished.  HENT:  Head: Normocephalic and atraumatic.  Mouth/Throat: Oropharynx is clear and  moist.  Eyes: Conjunctivae are normal. Right eye exhibits no discharge. Left eye exhibits no discharge.  Neck: Normal range of motion. Neck supple.  Cardiovascular: Regular rhythm and normal heart sounds.  Exam reveals no gallop and no friction rub.   No murmur heard.      Tachycardic.  Pulmonary/Chest: She has wheezes.       Mild tachypnea. Expiratory wheezing diffusely. No accessory muscle usage.  Abdominal: Soft. She exhibits no distension. There is no tenderness.  Musculoskeletal: Normal range of motion. She exhibits no edema and no tenderness.  Lymphadenopathy:    She has no cervical adenopathy.  Neurological: She is alert.  Skin: Skin is warm and dry. She is not diaphoretic.  Psychiatric: She has a normal mood and affect. Her behavior is normal. Thought content normal.    ED Course  Procedures (including critical care time)  Labs Reviewed - No data to display No results found.  5:44 PM Pt with pulse ox 97-98% on RA while sitting in bed. No respiratory distress. Requesting something for pain. Pt with chronic back and leg pain. Agreed to dose dilaudid. Awaiting ambulatory pulse oximetry.  8:57 PM Called to bedside again by nursing. Pt requesting pain medicine on DC. Pt with chronic pain and in pain management.  Explained that most appropriate management would be by them. Pt then asking for "samples" of asthma meds. Pt states does have some at home. Will give  MDI to go home with in addition to steroids.   1. Asthma exacerbation   2. Hypoxia       MDM  30yF with dyspnea and hypoxia. Clinical picture consistent with asthma exacerbation. Hx of same, similarity to previous and diffuse wheezing on exam. Improved with meds and steroids. Consider infectious, PE, atypical ACS but clinically doubt. Pt observed in ED for several hours and has had pulse ox in high 90s on RA prior to DC. Clinically feels better from respiratory standpoint but complaining of back pain which is chronic.  Steroids and outpt fu.         Raeford Razor, MD 03/18/11 2105

## 2011-03-18 NOTE — ED Provider Notes (Signed)
History     CSN: [id number] Arrival date & time: 03/18/2011  1:58 PM   None     Chief Complaint  Patient presents with  . Asthma    (Consider location/radiation/quality/duration/timing/severity/associated sxs/prior treatment) HPI Asthma complains onset 15 minutes ago. Diffuse wheezing no cough no other complaint. Treated by EMS with albuterol and Solu-Medrol 125 mg without adequate relief. No pain anywhere no other complaint No past medical history on file. Past medical history asthma, chronic pain No past surgical history on file.  No family history on file.  History  Substance Use Topics  . Smoking status: Not on file  . Smokeless tobacco: Not on file  . Alcohol Use: Not on file   Social history nonsmoker occasional alcohol occasional illicit drug use including heroin and cocaine last time 4 days ago OB History    No data available      Review of Systems  Constitutional: Negative.   HENT: Negative.   Respiratory: Positive for shortness of breath and wheezing.   Cardiovascular: Negative.   Gastrointestinal: Negative.   Musculoskeletal: Negative.   Skin: Negative.   Neurological: Negative.   Hematological: Negative.   Psychiatric/Behavioral: Negative.     Allergies  Peanut-containing drug products and Penicillins  Home Medications   Current Outpatient Rx  Name Route Sig Dispense Refill  . ALBUTEROL SULFATE HFA 108 (90 BASE) MCG/ACT IN AERS Inhalation Inhale 2 puffs into the lungs every 6 (six) hours as needed. For shortness of breath     . FLUTICASONE-SALMETEROL 250-50 MCG/DOSE IN AEPB Inhalation Inhale 1 puff into the lungs every 12 (twelve) hours.      Carma Leaven M PLUS PO TABS Oral Take 1 tablet by mouth daily.      . OXYCODONE-ACETAMINOPHEN 10-325 MG PO TABS Oral Take 2 tablets by mouth every 4 (four) hours.        BP 103/60  Pulse 153  Resp 21  SpO2 89%  Physical Exam  Nursing note and vitals reviewed. Constitutional: She appears well-developed and  well-nourished. No distress.  HENT:  Head: Normocephalic and atraumatic.  Eyes: Conjunctivae are normal. Pupils are equal, round, and reactive to light.  Neck: Neck supple. No tracheal deviation present. No thyromegaly present.  Cardiovascular: Normal rate and regular rhythm.   No murmur heard. Pulmonary/Chest: She has wheezes.       SpeaksIn sentences prolonged expiratory phase, expiratory wheezes mild respiratory distress  Abdominal: Soft. Bowel sounds are normal. She exhibits no distension. There is no tenderness.  Musculoskeletal: Normal range of motion. She exhibits no edema and no tenderness.  Neurological: She is alert. Coordination normal.  Skin: Skin is warm and dry. No rash noted.  Psychiatric: She has a normal mood and affect.    ED Course  Procedures (including critical care time)  Labs Reviewed - No data to display No results found.   No diagnosis found.    MDM  Not my patient        Doug Sou, MD 03/18/11 [phone]

## 2017-04-23 DIAGNOSIS — T50901A Poisoning by unspecified drugs, medicaments and biological substances, accidental (unintentional), initial encounter: Secondary | ICD-10-CM | POA: Insufficient documentation

## 2017-04-23 DIAGNOSIS — R05 Cough: Secondary | ICD-10-CM | POA: Diagnosis not present

## 2017-04-23 DIAGNOSIS — F1910 Other psychoactive substance abuse, uncomplicated: Secondary | ICD-10-CM | POA: Insufficient documentation

## 2017-04-29 DIAGNOSIS — J09X1 Influenza due to identified novel influenza A virus with pneumonia: Secondary | ICD-10-CM | POA: Insufficient documentation

## 2017-04-29 DIAGNOSIS — F1320 Sedative, hypnotic or anxiolytic dependence, uncomplicated: Secondary | ICD-10-CM | POA: Insufficient documentation

## 2017-06-02 DIAGNOSIS — M79673 Pain in unspecified foot: Secondary | ICD-10-CM | POA: Diagnosis not present

## 2017-08-08 DIAGNOSIS — R0902 Hypoxemia: Secondary | ICD-10-CM | POA: Insufficient documentation

## 2017-08-08 DIAGNOSIS — J45901 Unspecified asthma with (acute) exacerbation: Secondary | ICD-10-CM | POA: Insufficient documentation

## 2017-08-09 DIAGNOSIS — B002 Herpesviral gingivostomatitis and pharyngotonsillitis: Secondary | ICD-10-CM | POA: Insufficient documentation

## 2017-12-08 DIAGNOSIS — M79672 Pain in left foot: Secondary | ICD-10-CM | POA: Insufficient documentation

## 2017-12-08 DIAGNOSIS — M79671 Pain in right foot: Secondary | ICD-10-CM | POA: Insufficient documentation

## 2019-10-15 ENCOUNTER — Other Ambulatory Visit: Payer: Self-pay

## 2019-10-15 ENCOUNTER — Emergency Department (HOSPITAL_BASED_OUTPATIENT_CLINIC_OR_DEPARTMENT_OTHER): Payer: Medicaid Other

## 2019-10-15 ENCOUNTER — Emergency Department (HOSPITAL_BASED_OUTPATIENT_CLINIC_OR_DEPARTMENT_OTHER)
Admission: EM | Admit: 2019-10-15 | Discharge: 2019-10-15 | Disposition: A | Discharge status: Home / Self Care | Payer: Medicaid Other | Attending: Emergency Medicine | Admitting: Emergency Medicine

## 2019-10-15 ENCOUNTER — Encounter (HOSPITAL_BASED_OUTPATIENT_CLINIC_OR_DEPARTMENT_OTHER): Payer: Self-pay

## 2019-10-15 DIAGNOSIS — Z7951 Long term (current) use of inhaled steroids: Secondary | ICD-10-CM | POA: Diagnosis not present

## 2019-10-15 DIAGNOSIS — R5383 Other fatigue: Secondary | ICD-10-CM | POA: Insufficient documentation

## 2019-10-15 DIAGNOSIS — R1011 Right upper quadrant pain: Secondary | ICD-10-CM | POA: Insufficient documentation

## 2019-10-15 DIAGNOSIS — R112 Nausea with vomiting, unspecified: Secondary | ICD-10-CM | POA: Insufficient documentation

## 2019-10-15 DIAGNOSIS — Z79899 Other long term (current) drug therapy: Secondary | ICD-10-CM | POA: Insufficient documentation

## 2019-10-15 DIAGNOSIS — R000 Tachycardia, unspecified: Secondary | ICD-10-CM | POA: Insufficient documentation

## 2019-10-15 DIAGNOSIS — R197 Diarrhea, unspecified: Secondary | ICD-10-CM | POA: Diagnosis not present

## 2019-10-15 DIAGNOSIS — R0602 Shortness of breath: Secondary | ICD-10-CM | POA: Diagnosis not present

## 2019-10-15 DIAGNOSIS — M25511 Pain in right shoulder: Secondary | ICD-10-CM | POA: Insufficient documentation

## 2019-10-15 DIAGNOSIS — Z20822 Contact with and (suspected) exposure to covid-19: Secondary | ICD-10-CM | POA: Insufficient documentation

## 2019-10-15 DIAGNOSIS — R1084 Generalized abdominal pain: Secondary | ICD-10-CM | POA: Insufficient documentation

## 2019-10-15 DIAGNOSIS — J90 Pleural effusion, not elsewhere classified: Secondary | ICD-10-CM | POA: Diagnosis not present

## 2019-10-15 DIAGNOSIS — M545 Low back pain: Secondary | ICD-10-CM | POA: Diagnosis not present

## 2019-10-15 DIAGNOSIS — R1110 Vomiting, unspecified: Secondary | ICD-10-CM | POA: Diagnosis not present

## 2019-10-15 DIAGNOSIS — M791 Myalgia, unspecified site: Secondary | ICD-10-CM | POA: Insufficient documentation

## 2019-10-15 DIAGNOSIS — R109 Unspecified abdominal pain: Secondary | ICD-10-CM

## 2019-10-15 DIAGNOSIS — R1031 Right lower quadrant pain: Secondary | ICD-10-CM | POA: Insufficient documentation

## 2019-10-15 DIAGNOSIS — J45909 Unspecified asthma, uncomplicated: Secondary | ICD-10-CM | POA: Diagnosis not present

## 2019-10-15 HISTORY — DX: Unspecified asthma, uncomplicated: J45.909

## 2019-10-15 LAB — URINALYSIS, ROUTINE W REFLEX MICROSCOPIC
Bilirubin Urine: NEGATIVE
Glucose, UA: NEGATIVE mg/dL
Hgb urine dipstick: NEGATIVE
Ketones, ur: NEGATIVE mg/dL
Leukocytes,Ua: NEGATIVE
Nitrite: NEGATIVE
Protein, ur: NEGATIVE mg/dL
Specific Gravity, Urine: 1.02 (ref 1.005–1.030)
pH: >9 — ABNORMAL HIGH (ref 5.0–8.0)

## 2019-10-15 LAB — COMPREHENSIVE METABOLIC PANEL
ALT: 14 U/L (ref 0–44)
AST: 20 U/L (ref 15–41)
Albumin: 3.9 g/dL (ref 3.5–5.0)
Alkaline Phosphatase: 53 U/L (ref 38–126)
Anion gap: 12 (ref 5–15)
BUN: 11 mg/dL (ref 6–20)
CO2: 25 mmol/L (ref 22–32)
Calcium: 8.7 mg/dL — ABNORMAL LOW (ref 8.9–10.3)
Chloride: 101 mmol/L (ref 98–111)
Creatinine, Ser: 0.53 mg/dL (ref 0.44–1.00)
GFR calc Af Amer: >60 mL/min (ref >60)
GFR calc non Af Amer: >60 mL/min (ref >60)
Glucose, Bld: 104 mg/dL — ABNORMAL HIGH (ref 70–99)
Potassium: 3.1 mmol/L — ABNORMAL LOW (ref 3.5–5.1)
Sodium: 138 mmol/L (ref 135–145)
Total Bilirubin: 0.3 mg/dL (ref 0.3–1.2)
Total Protein: 6.6 g/dL (ref 6.5–8.1)

## 2019-10-15 LAB — CBC WITH DIFFERENTIAL/PLATELET
Abs Immature Granulocytes: 0.03 10*3/uL (ref 0.00–0.07)
Basophils Absolute: 0 10*3/uL (ref 0.0–0.1)
Basophils Relative: 0 %
Eosinophils Absolute: 0.1 10*3/uL (ref 0.0–0.5)
Eosinophils Relative: 1 %
HCT: 33.4 % — ABNORMAL LOW (ref 36.0–46.0)
Hemoglobin: 10.5 g/dL — ABNORMAL LOW (ref 12.0–15.0)
Immature Granulocytes: 0 %
Lymphocytes Relative: 18 %
Lymphs Abs: 1.5 10*3/uL (ref 0.7–4.0)
MCH: 25.1 pg — ABNORMAL LOW (ref 26.0–34.0)
MCHC: 31.4 g/dL (ref 30.0–36.0)
MCV: 79.7 fL — ABNORMAL LOW (ref 80.0–100.0)
Monocytes Absolute: 0.6 10*3/uL (ref 0.1–1.0)
Monocytes Relative: 7 %
Neutro Abs: 5.9 10*3/uL (ref 1.7–7.7)
Neutrophils Relative %: 74 %
Platelets: 319 10*3/uL (ref 150–400)
RBC: 4.19 MIL/uL (ref 3.87–5.11)
RDW: 16.5 % — ABNORMAL HIGH (ref 11.5–15.5)
WBC: 8.1 10*3/uL (ref 4.0–10.5)
nRBC: 0 % (ref 0.0–0.2)

## 2019-10-15 LAB — SARS CORONAVIRUS 2 BY RT PCR (HOSPITAL ORDER, PERFORMED IN ~~LOC~~ HOSPITAL LAB): SARS Coronavirus 2: NEGATIVE

## 2019-10-15 LAB — LIPASE, BLOOD: Lipase: 27 U/L (ref 11–51)

## 2019-10-15 LAB — PREGNANCY, URINE: Preg Test, Ur: NEGATIVE

## 2019-10-15 MED ORDER — HYDROMORPHONE HCL 1 MG/ML IJ SOLN
0.5000 mg | Freq: Once | INTRAMUSCULAR | Status: AC
  Administered 2019-10-15: 0.5 mg via INTRAVENOUS
  Filled 2019-10-15: qty 1

## 2019-10-15 MED ORDER — OXYCODONE-ACETAMINOPHEN 5-325 MG PO TABS
1.0000 | ORAL_TABLET | Freq: Once | ORAL | Status: AC
  Administered 2019-10-15: 1 via ORAL
  Filled 2019-10-15: qty 1

## 2019-10-15 MED ORDER — POTASSIUM CHLORIDE CRYS ER 20 MEQ PO TBCR
40.0000 meq | EXTENDED_RELEASE_TABLET | Freq: Once | ORAL | Status: AC
  Administered 2019-10-15: 40 meq via ORAL
  Filled 2019-10-15: qty 2

## 2019-10-15 MED ORDER — DICYCLOMINE HCL 20 MG PO TABS: 20.0000 mg | ORAL_TABLET | Freq: Two times a day (BID) | ORAL | 0 refills | Status: DC

## 2019-10-15 MED ORDER — ONDANSETRON HCL 4 MG/2ML IJ SOLN
4.0000 mg | Freq: Once | INTRAMUSCULAR | Status: AC
  Administered 2019-10-15: 4 mg via INTRAVENOUS
  Filled 2019-10-15: qty 2

## 2019-10-15 MED ORDER — ONDANSETRON 4 MG PO TBDP
4.0000 mg | ORAL_TABLET | Freq: Three times a day (TID) | ORAL | 0 refills | Status: DC | PRN
Start: 2019-10-15 — End: 2019-12-07

## 2019-10-15 MED ORDER — SODIUM CHLORIDE 0.9 % IV BOLUS
1000.0000 mL | Freq: Once | INTRAVENOUS | Status: AC
  Administered 2019-10-15: 1000 mL via INTRAVENOUS

## 2019-10-15 NOTE — ED Provider Notes (Signed)
[facility] EMERGENCY DEPARTMENT Provider Note   CSN: [id number] Arrival date & time: 10/15/19  2026     History Chief Complaint  Patient presents with  . Emesis    Vicki Moore is a 39 y.o. female.  Patient with no past surgical history, history of asthma presents the emergency department for nausea, vomiting, diarrhea, wheezing and shortness of breath.  Symptoms started approximately 5 to 6 days ago.  Patient reports acute onset of multiple episodes of vomiting and diarrhea.  On day 3 of illness she felt slightly better was able to eat and drink small amount.  Then, symptoms returned and were more severe today prompting emergency department visit.  She complains of persistent vomiting.  She reports generalized abdominal pain, worse on the right side.  She has had wheezing which she states is better controlled now after use of home nebulizer treatment.  She complains of pain in her back and right shoulder as well.  No fevers reported.  No known sick contacts.  She does report drinking several shots of irish whiskey since pain started because pain was severe.         Past Medical History:  Diagnosis Date  . Asthma     Patient Active Problem List   Diagnosis Date Noted  . ANXIETY 03/22/2009  . ASTHMA 01/02/2009  . LOW BACK PAIN 01/02/2009    History reviewed. No pertinent surgical history.   OB History   No obstetric history on file.     No family history on file.  Social History   Tobacco Use  . Smoking status: Current Some Day Smoker  . Smokeless tobacco: Never Used  Vaping Use  . Vaping Use: Never used  Substance Use Topics  . Alcohol use: Yes  . Drug use: Never    Home Medications Prior to Admission medications   Medication Sig Start Date End Date Taking? Authorizing Provider  albuterol (PROVENTIL HFA;VENTOLIN HFA) 108 (90 BASE) MCG/ACT inhaler Inhale 2 puffs into the lungs every 6 (six) hours as needed. For shortness of breath      [provider]  Fluticasone-Salmeterol (ADVAIR) 250-50 MCG/DOSE AEPB Inhale 1 puff into the lungs every 12 (twelve) hours.      [provider]  Multiple Vitamins-Minerals (MULTIVITAMINS THER. W/MINERALS) TABS Take 1 tablet by mouth daily.      [provider]  oxyCODONE-acetaminophen (PERCOCET) 10-325 MG per tablet Take 2 tablets by mouth every 4 (four) hours.      [provider]  predniSONE (DELTASONE) 20 MG tablet Take 2 tablets (40 mg total) by mouth daily. 03/18/11   Raeford Razor, MD    Allergies    Peanut-containing drug products and Penicillins  Review of Systems   Review of Systems  Constitutional: Positive for fatigue. Negative for appetite change, chills and fever.  HENT: Negative for rhinorrhea and sore throat.   Eyes: Negative for redness.  Respiratory: Positive for cough, shortness of breath and wheezing.   Cardiovascular: Negative for chest pain.  Gastrointestinal: Positive for abdominal pain, diarrhea, nausea and vomiting. Negative for blood in stool.       Negative for hematemesis  Genitourinary: Negative for dysuria, frequency, hematuria and urgency.       +strong odor of urine  Musculoskeletal: Positive for back pain and myalgias.  Skin: Negative for rash.  Neurological: Negative for light-headedness.    Physical Exam Updated Vital Signs BP (!) 105/58 (BP Location: Right Arm)   Pulse (!) 114  Temp 99.1 F (37.3 C) (Oral)   Resp 20   Ht 5\' 1"  (1.549 m)   Wt 90.7 kg   LMP 10/01/2019   SpO2 100%   BMI 37.79 kg/m   Physical Exam Vitals and nursing note reviewed.  Constitutional:      Appearance: She is well-developed.  HENT:     Head: Normocephalic and atraumatic.     Mouth/Throat:     Mouth: Mucous membranes are moist.  Eyes:     General:        Right eye: No discharge.        Left eye: No discharge.     Conjunctiva/sclera: Conjunctivae normal.  Cardiovascular:     Rate and Rhythm: Regular rhythm. Tachycardia  present.     Heart sounds: Normal heart sounds.     Comments: Mild tachycardia 100 at time of exam Pulmonary:     Effort: Pulmonary effort is normal.     Breath sounds: Wheezing (mild scattered expiratory wheezing) present.  Abdominal:     Palpations: Abdomen is soft.     Tenderness: There is abdominal tenderness in the right upper quadrant and right lower quadrant. There is no guarding or rebound. Negative signs include Murphy's sign, Rovsing's sign and McBurney's sign.     Comments: Mild tenderness R upper and lower quadrants  Musculoskeletal:     Cervical back: Normal range of motion and neck supple.  Skin:    General: Skin is warm and dry.  Neurological:     Mental Status: She is alert.     ED Results / Procedures / Treatments   Labs (all labs ordered are listed, but only abnormal results are displayed) Labs Reviewed  CBC WITH DIFFERENTIAL/PLATELET - Abnormal; Notable for the following components:      Result Value   Hemoglobin 10.5 (*)    HCT 33.4 (*)    MCV 79.7 (*)    MCH 25.1 (*)    RDW 16.5 (*)    All other components within normal limits  COMPREHENSIVE METABOLIC PANEL - Abnormal; Notable for the following components:   Potassium 3.1 (*)    Glucose, Bld 104 (*)    Calcium 8.7 (*)    All other components within normal limits  URINALYSIS, ROUTINE W REFLEX MICROSCOPIC - Abnormal; Notable for the following components:   APPearance CLOUDY (*)    pH >9.0 (*)    All other components within normal limits  SARS CORONAVIRUS 2 BY RT PCR (HOSPITAL ORDER, PERFORMED IN [facility])  LIPASE, BLOOD  PREGNANCY, URINE    EKG None  Radiology DG Chest Port 1 View  Result Date: 10/15/2019 CLINICAL DATA:  Nausea, vomiting and diarrhea, shortness of breath EXAM: PORTABLE CHEST 1 VIEW COMPARISON:  CT 08/11/2017, radiograph 08/08/2017 FINDINGS: Bandlike opacity in the left upper lung likely reflecting some scarring present on comparison CT. Low lung volumes with streaky  basilar atelectatic changes. Mild airways thickening is present. No focal consolidative opacity is seen. No pneumothorax or visible effusion. The cardiomediastinal contours are unremarkable. No acute osseous or soft tissue abnormality. IMPRESSION: 1. Mild airways thickening could reflect a bronchitis or reactive airways disease. 2. Bandlike opacity in the left upper lung likely reflecting some scarring present on comparison CT. Electronically Signed   By: 08/10/2017 M.D.   On: 10/15/2019 21:20    Procedures Procedures (including critical care time)  Medications Ordered in ED Medications  oxyCODONE-acetaminophen (PERCOCET/ROXICET) 5-325 MG per tablet 1 tablet (has no administration in  time range)  sodium chloride 0.9 % bolus 1,000 mL (0 mLs Intravenous Stopped 10/15/19 2230)  ondansetron (ZOFRAN) injection 4 mg (4 mg Intravenous Given 10/15/19 2125)  potassium chloride SA (KLOR-CON) CR tablet 40 mEq (40 mEq Oral Given 10/15/19 2229)  HYDROmorphone (DILAUDID) injection 0.5 mg (0.5 mg Intravenous Given 10/15/19 2303)    ED Course  I have reviewed the triage vital signs and the nursing notes.  Pertinent labs & imaging results that were available during my care of the patient were reviewed by me and considered in my medical decision making (see chart for details).  Patient seen and examined. Work-up initiated. Medications ordered.   Vital signs reviewed and are as follows: BP (!) 105/58 (BP Location: Right Arm)   Pulse (!) 114   Temp 99.1 F (37.3 C) (Oral)   Resp 20   Ht 5\' 1"  (1.549 m)   Wt 90.7 kg   LMP 10/01/2019   SpO2 100%   BMI 37.79 kg/m  , 10:35 PM Patient updated on results. Awaiting UA. Covid test neg. Will fluid challenge and give PO potassium.   11:41 PM passed p.o. challenge and does not have any further vomiting.  UA neg for infection.  Patient was given 0.5 mg hydromorphone prior to discharge.  She states that this is improved her pain.  We discussed reassuring lab work.   Strongly encouraged return tomorrow for right upper quadrant ultrasound.  Plan for discharged home with Zofran and Bentyl.  The patient was urged to return to the Emergency Department immediately with worsening of current symptoms, worsening abdominal pain, persistent vomiting, blood noted in stools, fever, or any other concerns. The patient verbalized understanding.   BP (!) 112/56 (BP Location: Right Arm)   Pulse (!) 104   Temp 99.1 F (37.3 C) (Oral)   Resp 20   Ht 5\' 1"  (1.549 m)   Wt 90.7 kg   LMP 10/01/2019   SpO2 95%   BMI 37.79 kg/m       MDM Rules/Calculators/A&P                          Patient with abdominal pain, right-sided with radiation to the shoulder. Vitals are stable, no fever. Labs normal WBC, liver/pancreas test, mild hypokalemia, mild anemia. Imaging RUQ ordered for tomorrow. No signs of dehydration, patient is now tolerating PO's. Lungs are clear and no signs suggestive of PNA on CXR. Suspect gallstones, possible gastritis/PUD.  Low concern for appendicitis, cholecystitis, pancreatitis, ruptured viscus, UTI, kidney stone, aortic dissection, aortic aneurysm or other emergent abdominal etiology. Supportive therapy indicated with return if symptoms worsen.   Final Clinical Impression(s) / ED Diagnoses Final diagnoses:  Nausea vomiting and diarrhea  Abdominal pain, unspecified abdominal location    Rx / DC Orders ED Discharge Orders         Ordered    10/03/2019 Abdomen Limited RUQ/Gall Bladder     Discontinue     10/15/19 2302    ondansetron (ZOFRAN ODT) 4 MG disintegrating tablet  Every 8 hours PRN     Discontinue  Reprint     10/15/19 2305    dicyclomine (BENTYL) 20 MG tablet  2 times daily     Discontinue  Reprint     10/15/19 2305           12/16/19, PA-C 10/15/19 2346    Renne Crigler, MD 10/16/19 1144

## 2019-10-15 NOTE — Discharge Instructions (Signed)
Please read and follow all provided instructions.  Your diagnoses today include:  1. Nausea vomiting and diarrhea   2. Abdominal pain, unspecified abdominal location     Tests performed today include:  Blood counts and electrolytes - low potassium, otherwise okay  Blood tests to check liver and kidney function  Blood tests to check pancreas function  Urine test to look for infection - no infection  COVID test - negative  Vital signs. See below for your results today.   Medications prescribed:   Zofran (ondansetron) - for nausea and vomiting   Bentyl - medication for intestinal cramps and spasms  Take any prescribed medications only as directed.  Home care instructions:   Follow any educational materials contained in this packet.  Follow-up instructions: Return tomorrow as directed for your abdominal ultrasound.   Return instructions:  SEEK IMMEDIATE MEDICAL ATTENTION IF:  The pain does not go away or becomes severe   A temperature above 101F develops   Repeated vomiting occurs (multiple episodes)   The pain becomes localized to portions of the abdomen. The right side could possibly be appendicitis. In an adult, the left lower portion of the abdomen could be colitis or diverticulitis.   Blood is being passed in stools or vomit (bright red or black tarry stools)   You develop chest pain, difficulty breathing, dizziness or fainting, or become confused, poorly responsive, or inconsolable (young children)  If you have any other emergent concerns regarding your health  Additional Information: Abdominal (belly) pain can be caused by many things. Your caregiver performed an examination and possibly ordered blood/urine tests and imaging (CT scan, x-rays, ultrasound). Many cases can be observed and treated at home after initial evaluation in the emergency department. Even though you are being discharged home, abdominal pain can be unpredictable. Therefore, you need a  repeated exam if your pain does not resolve, returns, or worsens. Most patients with abdominal pain don't have to be admitted to the hospital or have surgery, but serious problems like appendicitis and gallbladder attacks can start out as nonspecific pain. Many abdominal conditions cannot be diagnosed in one visit, so follow-up evaluations are very important.  Your vital signs today were: BP (!) 112/56 (BP Location: Right Arm)   Pulse (!) 104   Temp 99.1 F (37.3 C) (Oral)   Resp 20   Ht 5\' 1"  (1.549 m)   Wt 90.7 kg   LMP 10/01/2019   SpO2 95%   BMI 37.79 kg/m  If your blood pressure (bp) was elevated above 135/85 this visit, please have this repeated by your doctor within one month. --------------

## 2019-10-15 NOTE — ED Triage Notes (Signed)
Pt c/o N/V/D x 4 days. Pt reports yesterday she had ShOB and coughing.

## 2019-10-16 ENCOUNTER — Ambulatory Visit (HOSPITAL_BASED_OUTPATIENT_CLINIC_OR_DEPARTMENT_OTHER)
Admission: RE | Admit: 2019-10-16 | Discharge: 2019-10-16 | Disposition: A | Discharge status: Home / Self Care | Payer: Medicaid Other | Source: Ambulatory Visit | Attending: Emergency Medicine | Admitting: Emergency Medicine

## 2019-10-16 DIAGNOSIS — R112 Nausea with vomiting, unspecified: Secondary | ICD-10-CM | POA: Insufficient documentation

## 2019-10-16 DIAGNOSIS — R1011 Right upper quadrant pain: Secondary | ICD-10-CM | POA: Insufficient documentation

## 2019-11-05 ENCOUNTER — Encounter (HOSPITAL_BASED_OUTPATIENT_CLINIC_OR_DEPARTMENT_OTHER): Payer: Self-pay

## 2019-11-05 ENCOUNTER — Emergency Department (HOSPITAL_BASED_OUTPATIENT_CLINIC_OR_DEPARTMENT_OTHER)
Admission: EM | Admit: 2019-11-05 | Discharge: 2019-11-06 | Disposition: A | Discharge status: Home / Self Care | Payer: Medicaid Other | Attending: Emergency Medicine | Admitting: Emergency Medicine

## 2019-11-05 ENCOUNTER — Other Ambulatory Visit: Payer: Self-pay

## 2019-11-05 ENCOUNTER — Emergency Department (HOSPITAL_BASED_OUTPATIENT_CLINIC_OR_DEPARTMENT_OTHER): Payer: Medicaid Other

## 2019-11-05 DIAGNOSIS — Z91010 Allergy to peanuts: Secondary | ICD-10-CM | POA: Diagnosis not present

## 2019-11-05 DIAGNOSIS — N209 Urinary calculus, unspecified: Secondary | ICD-10-CM | POA: Diagnosis not present

## 2019-11-05 DIAGNOSIS — N3289 Other specified disorders of bladder: Secondary | ICD-10-CM | POA: Diagnosis not present

## 2019-11-05 DIAGNOSIS — F17210 Nicotine dependence, cigarettes, uncomplicated: Secondary | ICD-10-CM | POA: Diagnosis not present

## 2019-11-05 DIAGNOSIS — J45909 Unspecified asthma, uncomplicated: Secondary | ICD-10-CM | POA: Diagnosis not present

## 2019-11-05 DIAGNOSIS — R1011 Right upper quadrant pain: Secondary | ICD-10-CM | POA: Insufficient documentation

## 2019-11-05 DIAGNOSIS — R1010 Upper abdominal pain, unspecified: Secondary | ICD-10-CM

## 2019-11-05 DIAGNOSIS — K824 Cholesterolosis of gallbladder: Secondary | ICD-10-CM | POA: Diagnosis not present

## 2019-11-05 LAB — CBC WITH DIFFERENTIAL/PLATELET
Abs Immature Granulocytes: 0.06 10*3/uL (ref 0.00–0.07)
Basophils Absolute: 0 10*3/uL (ref 0.0–0.1)
Basophils Relative: 0 %
Eosinophils Absolute: 0 10*3/uL (ref 0.0–0.5)
Eosinophils Relative: 0 %
HCT: 36.3 % (ref 36.0–46.0)
Hemoglobin: 11.3 g/dL — ABNORMAL LOW (ref 12.0–15.0)
Immature Granulocytes: 1 %
Lymphocytes Relative: 9 %
Lymphs Abs: 1.1 10*3/uL (ref 0.7–4.0)
MCH: 24.7 pg — ABNORMAL LOW (ref 26.0–34.0)
MCHC: 31.1 g/dL (ref 30.0–36.0)
MCV: 79.4 fL — ABNORMAL LOW (ref 80.0–100.0)
Monocytes Absolute: 0.1 10*3/uL (ref 0.1–1.0)
Monocytes Relative: 1 %
Neutro Abs: 11.2 10*3/uL — ABNORMAL HIGH (ref 1.7–7.7)
Neutrophils Relative %: 89 %
Platelets: 312 10*3/uL (ref 150–400)
RBC: 4.57 MIL/uL (ref 3.87–5.11)
RDW: 16.7 % — ABNORMAL HIGH (ref 11.5–15.5)
WBC: 12.5 10*3/uL — ABNORMAL HIGH (ref 4.0–10.5)
nRBC: 0 % (ref 0.0–0.2)

## 2019-11-05 LAB — COMPREHENSIVE METABOLIC PANEL
ALT: 13 U/L (ref 0–44)
AST: 14 U/L — ABNORMAL LOW (ref 15–41)
Albumin: 4.1 g/dL (ref 3.5–5.0)
Alkaline Phosphatase: 58 U/L (ref 38–126)
Anion gap: 11 (ref 5–15)
BUN: 14 mg/dL (ref 6–20)
CO2: 23 mmol/L (ref 22–32)
Calcium: 9 mg/dL (ref 8.9–10.3)
Chloride: 104 mmol/L (ref 98–111)
Creatinine, Ser: 0.74 mg/dL (ref 0.44–1.00)
GFR calc Af Amer: >60 mL/min (ref >60)
GFR calc non Af Amer: >60 mL/min (ref >60)
Glucose, Bld: 116 mg/dL — ABNORMAL HIGH (ref 70–99)
Potassium: 4.4 mmol/L (ref 3.5–5.1)
Sodium: 138 mmol/L (ref 135–145)
Total Bilirubin: 0.4 mg/dL (ref 0.3–1.2)
Total Protein: 7.4 g/dL (ref 6.5–8.1)

## 2019-11-05 LAB — LIPASE, BLOOD: Lipase: 27 U/L (ref 11–51)

## 2019-11-05 MED ORDER — MORPHINE SULFATE (PF) 4 MG/ML IV SOLN
4.0000 mg | Freq: Once | INTRAVENOUS | Status: AC
  Administered 2019-11-05: 4 mg via INTRAVENOUS
  Filled 2019-11-05: qty 1

## 2019-11-05 MED ORDER — HYDROMORPHONE HCL 1 MG/ML IJ SOLN
1.0000 mg | Freq: Once | INTRAMUSCULAR | Status: AC
  Administered 2019-11-06: 1 mg via INTRAVENOUS
  Filled 2019-11-05: qty 1

## 2019-11-05 MED ORDER — ONDANSETRON HCL 4 MG/2ML IJ SOLN
4.0000 mg | Freq: Once | INTRAMUSCULAR | Status: AC
  Administered 2019-11-05: 4 mg via INTRAVENOUS
  Filled 2019-11-05: qty 2

## 2019-11-05 MED ORDER — SODIUM CHLORIDE 0.9 % IV BOLUS
1000.0000 mL | Freq: Once | INTRAVENOUS | Status: AC
  Administered 2019-11-05: 1000 mL via INTRAVENOUS

## 2019-11-05 NOTE — ED Notes (Signed)
ED Provider at bedside. 

## 2019-11-05 NOTE — ED Provider Notes (Signed)
[facility] EMERGENCY DEPARTMENT Provider Note   CSN: [id number] Arrival date & time: 11/05/19  1757     History Chief Complaint  Patient presents with  . Abdominal Pain    Vicki Moore is a 39 y.o. female.  39 year old female presents with complaint of right upper quadrant abdominal pain radiating up to her right shoulder with nausea and vomiting.  Patient states symptoms started yesterday, states she was seen here on July 4, return on July 5 and was told that she has a large gallstone.  Reports loose stools, taking Mylanta for what shy things is GERD.  Patient is a vegetarian, has only been able to drink soup since this started, unable to keep this down at this point.  Denies fevers or chills, changes in bladder habits.  No prior abdominal surgeries.  No other complaints or concerns.        Past Medical History:  Diagnosis Date  . Asthma     Patient Active Problem List   Diagnosis Date Noted  . ANXIETY 03/22/2009  . ASTHMA 01/02/2009  . LOW BACK PAIN 01/02/2009    History reviewed. No pertinent surgical history.   OB History   No obstetric history on file.     No family history on file.  Social History   Tobacco Use  . Smoking status: Current Some Day Smoker  . Smokeless tobacco: Never Used  Vaping Use  . Vaping Use: Never used  Substance Use Topics  . Alcohol use: Yes  . Drug use: Never    Home Medications Prior to Admission medications   Medication Sig Start Date End Date Taking? Authorizing Provider  albuterol (PROVENTIL HFA;VENTOLIN HFA) 108 (90 BASE) MCG/ACT inhaler Inhale 2 puffs into the lungs every 6 (six) hours as needed. For shortness of breath     [provider]  dicyclomine (BENTYL) 20 MG tablet Take 1 tablet (20 mg total) by mouth 2 (two) times daily. 10/15/19   Renne Crigler, PA-C  Fluticasone-Salmeterol (ADVAIR) 250-50 MCG/DOSE AEPB Inhale 1 puff into the lungs every 12 (twelve) hours.      [provider]   Multiple Vitamins-Minerals (MULTIVITAMINS THER. W/MINERALS) TABS Take 1 tablet by mouth daily.      [provider]  ondansetron (ZOFRAN ODT) 4 MG disintegrating tablet Take 1 tablet (4 mg total) by mouth every 8 (eight) hours as needed for nausea or vomiting. 10/15/19   Renne Crigler, PA-C  oxyCODONE-acetaminophen (PERCOCET) 10-325 MG per tablet Take 2 tablets by mouth every 4 (four) hours.      [provider]  predniSONE (DELTASONE) 20 MG tablet Take 2 tablets (40 mg total) by mouth daily. 03/18/11   Raeford Razor, MD    Allergies    Other, Peanut-containing drug products, and Penicillins  Review of Systems   Review of Systems  Constitutional: Negative for fever.  Respiratory: Negative for shortness of breath.   Cardiovascular: Negative for chest pain.  Gastrointestinal: Positive for abdominal pain, diarrhea, nausea and vomiting. Negative for constipation.  Genitourinary: Negative for dysuria and frequency.  Musculoskeletal: Positive for back pain.  Skin: Negative for rash and wound.  Allergic/Immunologic: Negative for immunocompromised state.  Neurological: Negative for weakness.  Hematological: Negative for adenopathy.  All other systems reviewed and are negative.   Physical Exam Updated Vital Signs BP 118/73 (BP Location: Left Arm)   Pulse 94   Temp 98.8 F (37.1 C) (Oral)   Resp 18   Ht 5\' 1"  (1.549 m)  Wt 89.8 kg   LMP 11/02/2019   SpO2 96%   BMI 37.41 kg/m   Physical Exam Vitals and nursing note reviewed.  Constitutional:      General: She is not in acute distress.    Appearance: She is well-developed. She is not diaphoretic.  HENT:     Head: Normocephalic and atraumatic.  Cardiovascular:     Rate and Rhythm: Normal rate and regular rhythm.     Heart sounds: Normal heart sounds. No friction rub.  Pulmonary:     Effort: Pulmonary effort is normal.     Breath sounds: Normal breath sounds.  Abdominal:     Palpations: Abdomen is soft.      Tenderness: There is abdominal tenderness in the right upper quadrant. There is no right CVA tenderness or left CVA tenderness.  Skin:    General: Skin is warm and dry.  Neurological:     Mental Status: She is alert and oriented to person, place, and time.  Psychiatric:        Behavior: Behavior normal.     ED Results / Procedures / Treatments   Labs (all labs ordered are listed, but only abnormal results are displayed) Labs Reviewed  CBC WITH DIFFERENTIAL/PLATELET - Abnormal; Notable for the following components:      Result Value   WBC 12.5 (*)    Hemoglobin 11.3 (*)    MCV 79.4 (*)    MCH 24.7 (*)    RDW 16.7 (*)    Neutro Abs 11.2 (*)    All other components within normal limits  COMPREHENSIVE METABOLIC PANEL  LIPASE, BLOOD  URINALYSIS, ROUTINE W REFLEX MICROSCOPIC  PREGNANCY, URINE    EKG None  Radiology US Abdomen Limited  Result Date: 11/05/2019 CLINICAL DATA:  Pain EXAM: ULTRASOUND ABDOMEN LIMITED RIGHT UPPER QUADRANT COMPARISON:  September 16, 2019 FINDINGS: Gallbladder: There is no gallbladder wall thickening. Multiple small gallbladder polyps are noted measuring up to approximately 3 mm. The sonographic Eulah Pont sign is reported as positive. Common bile duct: Diameter: 2 mm Liver: No focal lesion identified. Within normal limits in parenchymal echogenicity. Portal vein is patent on color Doppler imaging with normal direction of blood flow towards the liver. Other: None. IMPRESSION: 1. No acute abnormality. No evidence for cholelithiasis or acute cholecystitis. 2. Again noted is a small gallbladder polyp. Per consensus statement, gallbladder polyps less than 6 mm are usually benign not requiring follow-up Electronically Signed   By: Katherine Mantle M.D.   On: 11/05/2019 20:36    Procedures Procedures (including critical care time)  Medications Ordered in ED Medications  ondansetron (ZOFRAN) injection 4 mg (has no administration in time range)  morphine 4 MG/ML  injection 4 mg (has no administration in time range)  sodium chloride 0.9 % bolus 1,000 mL (1,000 mLs Intravenous New Bag/Given 11/05/19 2240)    ED Course  I have reviewed the triage vital signs and the nursing notes.  Pertinent labs & imaging results that were available during my care of the patient were reviewed by me and considered in my medical decision making (see chart for details).  Clinical Course as of Nov 04 2257  Sun Nov 05, 2019  [id number] 39 year old female with complaint RUQ abdominal pain with nausea and vomiting. Patient had a RUQ [place] on 10/16/19 showing 2mm polyp vs stone. Patient has been taking Zofran at home and eating vegetable soup. Symptoms have been tolerable until yesterday, unable to keep anything down, denies fevers/chills. Also reports having  loose non bloody stools. On exam, has RUQ tenderness. US shows a small polyp, no stones, does have a positive Kerrigan Gombos sign otherwise no evidence of cholecystitis.  Labs pending (needed IV [place]), zofran, morphine, fluids ordered pending labs, will add CT as [place] does not clearly show source of patient's pain today.   [LM]  2247 Care signed out to Dr. Denton Lank at change of shift pending labs and CT.   [LM]    Clinical Course User Index [LM] Alden Hipp   MDM Rules/Calculators/A&P                         Final Clinical Impression(s) / ED Diagnoses Final diagnoses:  None    Rx / DC Orders ED Discharge Orders    None       Alden Hipp 11/05/19 2337    Melene Plan, DO 11/06/19 1755

## 2019-11-05 NOTE — ED Triage Notes (Signed)
Pt arrives with reports of  "Large gallstone" states that she had outpatient [place] on July 5th. Pt reports pain has gotten worse to RUQ and right shoulder.

## 2019-11-05 NOTE — ED Notes (Signed)
Patient transported to X-ray 

## 2019-11-05 NOTE — ED Notes (Signed)
Attempted IV for blood draw, unable to obtain pt states she needs [place] IV.

## 2019-11-06 ENCOUNTER — Encounter (HOSPITAL_BASED_OUTPATIENT_CLINIC_OR_DEPARTMENT_OTHER): Payer: Self-pay

## 2019-11-06 DIAGNOSIS — N209 Urinary calculus, unspecified: Secondary | ICD-10-CM | POA: Diagnosis not present

## 2019-11-06 DIAGNOSIS — N3289 Other specified disorders of bladder: Secondary | ICD-10-CM | POA: Diagnosis not present

## 2019-11-06 LAB — URINALYSIS, ROUTINE W REFLEX MICROSCOPIC
Bilirubin Urine: NEGATIVE
Glucose, UA: NEGATIVE mg/dL
Hgb urine dipstick: NEGATIVE
Ketones, ur: 15 mg/dL — AB
Leukocytes,Ua: NEGATIVE
Nitrite: NEGATIVE
Protein, ur: NEGATIVE mg/dL
Specific Gravity, Urine: 1.02 (ref 1.005–1.030)
pH: 7 (ref 5.0–8.0)

## 2019-11-06 LAB — PREGNANCY, URINE: Preg Test, Ur: NEGATIVE

## 2019-11-06 MED ORDER — FAMOTIDINE 20 MG PO TABS
20.0000 mg | ORAL_TABLET | Freq: Once | ORAL | Status: AC
  Administered 2019-11-06: 20 mg via ORAL
  Filled 2019-11-06: qty 1

## 2019-11-06 MED ORDER — TRAMADOL HCL 50 MG PO TABS: 50.0000 mg | ORAL_TABLET | Freq: Four times a day (QID) | ORAL | 0 refills | Status: DC | PRN

## 2019-11-06 MED ORDER — IOHEXOL 300 MG/ML  SOLN
100.0000 mL | Freq: Once | INTRAMUSCULAR | Status: AC | PRN
  Administered 2019-11-06: 100 mL via INTRAVENOUS

## 2019-11-06 MED ORDER — ALUM & MAG HYDROXIDE-SIMETH 200-200-20 MG/5ML PO SUSP
30.0000 mL | Freq: Once | ORAL | Status: AC
  Administered 2019-11-06: 30 mL via ORAL
  Filled 2019-11-06: qty 30

## 2019-11-06 MED ORDER — PANTOPRAZOLE SODIUM 40 MG PO TBEC
40.0000 mg | DELAYED_RELEASE_TABLET | Freq: Every day | ORAL | 0 refills | Status: DC
Start: 2019-11-06 — End: 2019-11-15

## 2019-11-06 NOTE — ED Notes (Signed)
MD in to speak with pt

## 2019-11-06 NOTE — ED Notes (Signed)
Pt states she does not have a ride home. Taxi voucher provided. Voiced understanding of d/c instructions.

## 2019-11-06 NOTE — Discharge Instructions (Addendum)
It was our pleasure to provide your ER care today - we hope that you feel better.  Take protonix (acid blocker med). You may also try pepcid or maalox for symptom relief. You may take ultram as need for pain - no driving when taking.   Your CT scan was read as showing no acute process. Incidental note was made on imaging of gallbladder polyp, and tiny kidney stones in kidney (I.e. not causing your pain).  Have your doctor review CT and ultrasound findings.   Follow up with GI doctor in the next couple weeks - call office to arrange appointment.   Take acetaminophen or ibuprofen as need for pain.  Follow up with primary care doctor  in the next 1-2 weeks.  Return to ER if worse, new symptoms, fevers, persistent vomiting, or other concern.   You were given pain medication in the ER - no driving for the next 6 hours.

## 2019-11-06 NOTE — ED Provider Notes (Addendum)
Signed out by earlier team to d/c to home if/when ct abd negative for acute process.   CT neg for acute process.   Rec pcp f/u.  Return precautions provided.   At d/c, pt requests pain med and antacid med rx for home. Will provide small quantity ultram, and also rx protonix. Also discussed possible gen surg/gi f/u re further gb eval/hida.         Cathren Laine, MD 11/06/19 [phone]

## 2019-11-06 NOTE — ED Notes (Addendum)
Pt requesting a RX pain medication and to speak with the MD again. Pt unsure if her ride will be able to pick her up.

## 2019-11-06 NOTE — ED Notes (Signed)
Patient transported to X-ray 

## 2019-11-07 ENCOUNTER — Emergency Department (HOSPITAL_COMMUNITY): Payer: Medicaid Other

## 2019-11-07 ENCOUNTER — Inpatient Hospital Stay (HOSPITAL_COMMUNITY)
Admission: EM | Admit: 2019-11-07 | Discharge: 2019-11-15 | DRG: 391 | Disposition: A | Discharge status: Home / Self Care | Payer: Medicaid Other | Attending: Internal Medicine | Admitting: Internal Medicine

## 2019-11-07 ENCOUNTER — Other Ambulatory Visit: Payer: Self-pay

## 2019-11-07 ENCOUNTER — Encounter (HOSPITAL_COMMUNITY): Payer: Self-pay

## 2019-11-07 DIAGNOSIS — G894 Chronic pain syndrome: Secondary | ICD-10-CM | POA: Diagnosis present

## 2019-11-07 DIAGNOSIS — D500 Iron deficiency anemia secondary to blood loss (chronic): Secondary | ICD-10-CM | POA: Diagnosis present

## 2019-11-07 DIAGNOSIS — J189 Pneumonia, unspecified organism: Secondary | ICD-10-CM | POA: Diagnosis present

## 2019-11-07 DIAGNOSIS — R1084 Generalized abdominal pain: Secondary | ICD-10-CM | POA: Diagnosis not present

## 2019-11-07 DIAGNOSIS — E669 Obesity, unspecified: Secondary | ICD-10-CM | POA: Diagnosis present

## 2019-11-07 DIAGNOSIS — I472 Ventricular tachycardia: Secondary | ICD-10-CM | POA: Diagnosis not present

## 2019-11-07 DIAGNOSIS — Z20822 Contact with and (suspected) exposure to covid-19: Secondary | ICD-10-CM | POA: Diagnosis present

## 2019-11-07 DIAGNOSIS — Z7952 Long term (current) use of systemic steroids: Secondary | ICD-10-CM

## 2019-11-07 DIAGNOSIS — L299 Pruritus, unspecified: Secondary | ICD-10-CM | POA: Diagnosis not present

## 2019-11-07 DIAGNOSIS — Z7289 Other problems related to lifestyle: Secondary | ICD-10-CM

## 2019-11-07 DIAGNOSIS — R079 Chest pain, unspecified: Secondary | ICD-10-CM | POA: Diagnosis not present

## 2019-11-07 DIAGNOSIS — K219 Gastro-esophageal reflux disease without esophagitis: Principal | ICD-10-CM

## 2019-11-07 DIAGNOSIS — J45909 Unspecified asthma, uncomplicated: Secondary | ICD-10-CM | POA: Diagnosis present

## 2019-11-07 DIAGNOSIS — Z888 Allergy status to other drugs, medicaments and biological substances status: Secondary | ICD-10-CM

## 2019-11-07 DIAGNOSIS — R112 Nausea with vomiting, unspecified: Secondary | ICD-10-CM

## 2019-11-07 DIAGNOSIS — R197 Diarrhea, unspecified: Secondary | ICD-10-CM | POA: Diagnosis present

## 2019-11-07 DIAGNOSIS — Z79891 Long term (current) use of opiate analgesic: Secondary | ICD-10-CM

## 2019-11-07 DIAGNOSIS — B029 Zoster without complications: Secondary | ICD-10-CM | POA: Diagnosis present

## 2019-11-07 DIAGNOSIS — R1011 Right upper quadrant pain: Secondary | ICD-10-CM | POA: Diagnosis present

## 2019-11-07 DIAGNOSIS — F411 Generalized anxiety disorder: Secondary | ICD-10-CM | POA: Diagnosis not present

## 2019-11-07 DIAGNOSIS — Z6837 Body mass index (BMI) 37.0-37.9, adult: Secondary | ICD-10-CM

## 2019-11-07 DIAGNOSIS — Z789 Other specified health status: Secondary | ICD-10-CM

## 2019-11-07 DIAGNOSIS — N200 Calculus of kidney: Secondary | ICD-10-CM | POA: Diagnosis present

## 2019-11-07 DIAGNOSIS — R609 Edema, unspecified: Secondary | ICD-10-CM | POA: Diagnosis not present

## 2019-11-07 DIAGNOSIS — M7591 Shoulder lesion, unspecified, right shoulder: Secondary | ICD-10-CM | POA: Diagnosis present

## 2019-11-07 DIAGNOSIS — Z79899 Other long term (current) drug therapy: Secondary | ICD-10-CM

## 2019-11-07 DIAGNOSIS — F17210 Nicotine dependence, cigarettes, uncomplicated: Secondary | ICD-10-CM | POA: Diagnosis present

## 2019-11-07 DIAGNOSIS — R110 Nausea: Secondary | ICD-10-CM | POA: Diagnosis not present

## 2019-11-07 DIAGNOSIS — F329 Major depressive disorder, single episode, unspecified: Secondary | ICD-10-CM | POA: Diagnosis present

## 2019-11-07 DIAGNOSIS — Z880 Allergy status to penicillin: Secondary | ICD-10-CM

## 2019-11-07 DIAGNOSIS — K449 Diaphragmatic hernia without obstruction or gangrene: Secondary | ICD-10-CM | POA: Diagnosis present

## 2019-11-07 DIAGNOSIS — Z91010 Allergy to peanuts: Secondary | ICD-10-CM

## 2019-11-07 DIAGNOSIS — I4510 Unspecified right bundle-branch block: Secondary | ICD-10-CM | POA: Diagnosis present

## 2019-11-07 DIAGNOSIS — K824 Cholesterolosis of gallbladder: Secondary | ICD-10-CM | POA: Diagnosis present

## 2019-11-07 LAB — RAPID URINE DRUG SCREEN, HOSP PERFORMED
Amphetamines: NOT DETECTED
Barbiturates: NOT DETECTED
Benzodiazepines: POSITIVE — AB
Cocaine: NOT DETECTED
Opiates: POSITIVE — AB
Tetrahydrocannabinol: NOT DETECTED

## 2019-11-07 LAB — URINALYSIS, ROUTINE W REFLEX MICROSCOPIC
Bilirubin Urine: NEGATIVE
Glucose, UA: NEGATIVE mg/dL
Hgb urine dipstick: NEGATIVE
Ketones, ur: NEGATIVE mg/dL
Leukocytes,Ua: NEGATIVE
Nitrite: NEGATIVE
Protein, ur: NEGATIVE mg/dL
Specific Gravity, Urine: 1.021 (ref 1.005–1.030)
pH: 5 (ref 5.0–8.0)

## 2019-11-07 LAB — CBC
HCT: 33.6 % — ABNORMAL LOW (ref 36.0–46.0)
Hemoglobin: 10.2 g/dL — ABNORMAL LOW (ref 12.0–15.0)
MCH: 24.4 pg — ABNORMAL LOW (ref 26.0–34.0)
MCHC: 30.4 g/dL (ref 30.0–36.0)
MCV: 80.4 fL (ref 80.0–100.0)
Platelets: 274 10*3/uL (ref 150–400)
RBC: 4.18 MIL/uL (ref 3.87–5.11)
RDW: 16.9 % — ABNORMAL HIGH (ref 11.5–15.5)
WBC: 10.7 10*3/uL — ABNORMAL HIGH (ref 4.0–10.5)
nRBC: 0 % (ref 0.0–0.2)

## 2019-11-07 LAB — COMPREHENSIVE METABOLIC PANEL
ALT: 12 U/L (ref 0–44)
AST: 14 U/L — ABNORMAL LOW (ref 15–41)
Albumin: 3.7 g/dL (ref 3.5–5.0)
Alkaline Phosphatase: 48 U/L (ref 38–126)
Anion gap: 11 (ref 5–15)
BUN: 7 mg/dL (ref 6–20)
CO2: 24 mmol/L (ref 22–32)
Calcium: 8.4 mg/dL — ABNORMAL LOW (ref 8.9–10.3)
Chloride: 106 mmol/L (ref 98–111)
Creatinine, Ser: 0.59 mg/dL (ref 0.44–1.00)
GFR calc Af Amer: >60 mL/min (ref >60)
GFR calc non Af Amer: >60 mL/min (ref >60)
Glucose, Bld: 68 mg/dL — ABNORMAL LOW (ref 70–99)
Potassium: 3.5 mmol/L (ref 3.5–5.1)
Sodium: 141 mmol/L (ref 135–145)
Total Bilirubin: 0.2 mg/dL — ABNORMAL LOW (ref 0.3–1.2)
Total Protein: 6.7 g/dL (ref 6.5–8.1)

## 2019-11-07 LAB — I-STAT BETA HCG BLOOD, ED (MC, WL, AP ONLY): I-stat hCG, quantitative: 5.4 m[IU]/mL — ABNORMAL HIGH (ref <5)

## 2019-11-07 LAB — LIPASE, BLOOD: Lipase: 27 U/L (ref 11–51)

## 2019-11-07 LAB — SURGICAL PCR SCREEN
MRSA, PCR: NEGATIVE
Staphylococcus aureus: POSITIVE — AB

## 2019-11-07 LAB — SARS CORONAVIRUS 2 BY RT PCR (HOSPITAL ORDER, PERFORMED IN ~~LOC~~ HOSPITAL LAB): SARS Coronavirus 2: NEGATIVE

## 2019-11-07 MED ORDER — LACTATED RINGERS IV BOLUS
500.0000 mL | Freq: Once | INTRAVENOUS | Status: AC
  Administered 2019-11-07: 500 mL via INTRAVENOUS

## 2019-11-07 MED ORDER — HYDROMORPHONE HCL 1 MG/ML IJ SOLN
0.5000 mg | INTRAMUSCULAR | Status: DC | PRN
  Administered 2019-11-07 – 2019-11-09 (×7): 1 mg via INTRAVENOUS
  Filled 2019-11-07 (×7): qty 1

## 2019-11-07 MED ORDER — LORAZEPAM 2 MG/ML IJ SOLN
0.5000 mg | Freq: Four times a day (QID) | INTRAMUSCULAR | Status: DC | PRN
  Administered 2019-11-07 – 2019-11-12 (×15): 0.5 mg via INTRAVENOUS
  Filled 2019-11-07 (×16): qty 1

## 2019-11-07 MED ORDER — PANTOPRAZOLE SODIUM 40 MG PO TBEC
40.0000 mg | DELAYED_RELEASE_TABLET | Freq: Two times a day (BID) | ORAL | Status: DC
  Administered 2019-11-07: 40 mg via ORAL
  Filled 2019-11-07: qty 1

## 2019-11-07 MED ORDER — KETOROLAC TROMETHAMINE 15 MG/ML IJ SOLN: 15.0000 mg | Freq: Once | INTRAMUSCULAR | Status: DC

## 2019-11-07 MED ORDER — HYDROMORPHONE HCL 1 MG/ML IJ SOLN
1.0000 mg | Freq: Once | INTRAMUSCULAR | Status: AC
  Administered 2019-11-07: 1 mg via INTRAVENOUS
  Filled 2019-11-07: qty 1

## 2019-11-07 MED ORDER — LORAZEPAM 2 MG/ML IJ SOLN
1.0000 mg | Freq: Once | INTRAMUSCULAR | Status: AC
  Administered 2019-11-07: 1 mg via INTRAVENOUS
  Filled 2019-11-07: qty 1

## 2019-11-07 MED ORDER — DICYCLOMINE HCL 20 MG PO TABS
20.0000 mg | ORAL_TABLET | Freq: Two times a day (BID) | ORAL | Status: DC | PRN
  Administered 2019-11-07 – 2019-11-11 (×3): 20 mg via ORAL
  Filled 2019-11-07 (×4): qty 1

## 2019-11-07 MED ORDER — CHLORHEXIDINE GLUCONATE CLOTH 2 % EX PADS
6.0000 | MEDICATED_PAD | Freq: Every day | CUTANEOUS | Status: DC
  Administered 2019-11-08 – 2019-11-09 (×2): 6 via TOPICAL

## 2019-11-07 MED ORDER — MORPHINE SULFATE (PF) 4 MG/ML IV SOLN
4.0000 mg | Freq: Once | INTRAVENOUS | Status: AC
  Administered 2019-11-07: 4 mg via INTRAVENOUS
  Filled 2019-11-07: qty 1

## 2019-11-07 MED ORDER — OXYCODONE HCL 5 MG PO TABS
5.0000 mg | ORAL_TABLET | ORAL | Status: DC | PRN
  Administered 2019-11-07 – 2019-11-12 (×19): 5 mg via ORAL
  Filled 2019-11-07 (×19): qty 1

## 2019-11-07 MED ORDER — POLYETHYLENE GLYCOL 3350 17 G PO PACK
17.0000 g | PACK | Freq: Every day | ORAL | Status: DC | PRN
  Administered 2019-11-12: 17 g via ORAL
  Filled 2019-11-07 (×2): qty 1

## 2019-11-07 MED ORDER — ONDANSETRON HCL 4 MG/2ML IJ SOLN
4.0000 mg | Freq: Once | INTRAMUSCULAR | Status: AC
  Administered 2019-11-07: 4 mg via INTRAVENOUS
  Filled 2019-11-07: qty 2

## 2019-11-07 MED ORDER — ACETAMINOPHEN 325 MG PO TABS: 650.0000 mg | ORAL_TABLET | Freq: Four times a day (QID) | ORAL | Status: DC | PRN

## 2019-11-07 MED ORDER — FLUTICASONE FUROATE-VILANTEROL 200-25 MCG/INH IN AEPB
1.0000 | INHALATION_SPRAY | Freq: Every day | RESPIRATORY_TRACT | Status: DC
  Administered 2019-11-08 – 2019-11-15 (×8): 1 via RESPIRATORY_TRACT
  Filled 2019-11-07: qty 28

## 2019-11-07 MED ORDER — MUPIROCIN 2 % EX OINT
1.0000 "application " | TOPICAL_OINTMENT | Freq: Two times a day (BID) | CUTANEOUS | Status: DC
  Administered 2019-11-08 – 2019-11-09 (×5): 1 via NASAL
  Filled 2019-11-07: qty 22

## 2019-11-07 MED ORDER — HYDROMORPHONE HCL 1 MG/ML IJ SOLN
0.5000 mg | Freq: Once | INTRAMUSCULAR | Status: AC
  Administered 2019-11-07: 0.5 mg via INTRAVENOUS
  Filled 2019-11-07: qty 1

## 2019-11-07 MED ORDER — ONDANSETRON HCL 4 MG PO TABS: 4.0000 mg | ORAL_TABLET | Freq: Four times a day (QID) | ORAL | Status: DC | PRN

## 2019-11-07 MED ORDER — NICOTINE 14 MG/24HR TD PT24
14.0000 mg | MEDICATED_PATCH | Freq: Every day | TRANSDERMAL | Status: DC
  Administered 2019-11-07 – 2019-11-14 (×8): 14 mg via TRANSDERMAL
  Filled 2019-11-07 (×8): qty 1

## 2019-11-07 MED ORDER — ONDANSETRON HCL 4 MG/2ML IJ SOLN
4.0000 mg | Freq: Four times a day (QID) | INTRAMUSCULAR | Status: DC | PRN
  Administered 2019-11-07 – 2019-11-13 (×10): 4 mg via INTRAVENOUS
  Filled 2019-11-07 (×11): qty 2

## 2019-11-07 MED ORDER — SODIUM CHLORIDE 0.9% FLUSH
3.0000 mL | Freq: Once | INTRAVENOUS | Status: AC
  Administered 2019-11-07: 3 mL via INTRAVENOUS

## 2019-11-07 MED ORDER — ACETAMINOPHEN 650 MG RE SUPP: 650.0000 mg | Freq: Four times a day (QID) | RECTAL | Status: DC | PRN

## 2019-11-07 MED ORDER — ALBUTEROL SULFATE (2.5 MG/3ML) 0.083% IN NEBU
2.5000 mg | INHALATION_SOLUTION | Freq: Four times a day (QID) | RESPIRATORY_TRACT | Status: DC | PRN
  Administered 2019-11-08 – 2019-11-13 (×3): 2.5 mg via RESPIRATORY_TRACT
  Filled 2019-11-07 (×2): qty 3

## 2019-11-07 NOTE — ED Triage Notes (Signed)
Pt reports to the ER for abdominal pain. Patient reports she has "stones in her gallbladder and kidneys" Patient reports N/V.

## 2019-11-07 NOTE — ED Triage Notes (Signed)
Per EMS-was seen in ED for same symptoms on the 25th-has not followed up with surgery-right upper abdominal pain due to gallstones

## 2019-11-07 NOTE — ED Provider Notes (Signed)
[facility]-EMERGENCY DEPT Provider Note   CSN: [id number] Arrival date & time: 11/07/19  6269     History Chief Complaint  Patient presents with  . Abdominal Pain    Vicki Moore is a 39 y.o. female.  With a PMHx of asthma, GERD, anxiety, and low back pain who presents to the ED today with worsening sharp right upper quadrant pain that radiates into her right shoulder. She denies finding anything that makes the pain better. She denies anything making the pain worse. She also complains of constant diarrhea that smells metallically as well as nausea, vomiting, and inability to tolerate solids.   She went to the ED on 07/04 for right upper quadrant pain radiating to her right shoulder. Patient's vital signs were stable and she had no fever. Her labs were unremarkable, no white count or increase in lipase or LFTS. RUQ US revealed 73mm stone vs polyp. Patient was discharge home with supportive therapy with return precautions. She continued to have abdominal pain that began to worsen and continued to radiate to her right shoulder. She continued to have diarrhea and inability to keep down solid foods. She states she mostly ate broth and drank water. On 07/25 she returned to the ED for the RUQ pain. Her workup at this time was also fairly unremarkable. The US Abdomen revealed less than 6 mm poly, also was positive for murphy's sign. No gallstones. CT A/P w/o C was also done and no acute gallbladder findings were seen on imaging. The only finding was "tiny non-obstructing bilateral renal stones." At this time patient continued to have no signs of infection, gallbladder or renal pathology. No indiciation for her abdominal pain was found. Patient was discharge home with protonix and instructed to follow up with GI or General Surgery. She returns to the ED today because the pain is worsened and she now complains of generalized abdominal pain worse in the RUQ.   She states she feels  warm and that when the pain worsens she has shortness of breath. She denies dysuria but states that her urine also smells metallically.   The history is provided by the patient.  Abdominal Pain Pain location:  RUQ Pain quality: sharp   Pain radiates to:  R shoulder Timing:  Constant Progression:  Worsening Chronicity:  Recurrent Relieved by:  Nothing Worsened by:  Movement, eating, position changes and vomiting Ineffective treatments:  Bowel activity, vomiting, flatus, belching and antacids Associated symptoms: diarrhea, flatus, nausea and vomiting   Associated symptoms: no chest pain, no constipation, no fever, no hematemesis and no shortness of breath        Past Medical History:  Diagnosis Date  . Asthma     Patient Active Problem List   Diagnosis Date Noted  . ANXIETY 03/22/2009  . ASTHMA 01/02/2009  . LOW BACK PAIN 01/02/2009    History reviewed. No pertinent surgical history.   OB History   No obstetric history on file.     History reviewed. No pertinent family history.  Social History   Tobacco Use  . Smoking status: Current Some Day Smoker  . Smokeless tobacco: Never Used  Vaping Use  . Vaping Use: Never used  Substance Use Topics  . Alcohol use: Yes  . Drug use: Never    Home Medications Prior to Admission medications   Medication Sig Start Date End Date Taking? Authorizing Provider  ADVAIR DISKUS 500-50 MCG/DOSE AEPB Inhale 1 puff into the lungs 2 (two) times daily. 10/11/19  Yes [provider]  albuterol (PROVENTIL HFA;VENTOLIN HFA) 108 (90 BASE) MCG/ACT inhaler Inhale 2 puffs into the lungs every 6 (six) hours as needed. For shortness of breath    Yes [provider]  alprazolam Prudy Feeler) 2 MG tablet Take 2 mg by mouth 3 (three) times daily as needed for anxiety. 10/16/19  Yes [provider]  alum & mag hydroxide-simeth (MAALOX/MYLANTA) 200-200-20 MG/5ML suspension Take 15 mLs by mouth every 6 (six) hours as needed for  indigestion or heartburn.   Yes [provider]  dicyclomine (BENTYL) 20 MG tablet Take 1 tablet (20 mg total) by mouth 2 (two) times daily. Patient taking differently: Take 20 mg by mouth 2 (two) times daily as needed for spasms.  10/15/19  Yes Renne Crigler, PA-C  ibuprofen (ADVIL) 200 MG tablet Take 400 mg by mouth every 6 (six) hours as needed for headache or mild pain.   Yes [provider]  ipratropium-albuterol (DUONEB) 0.5-2.5 (3) MG/3ML SOLN Take 3 mLs by nebulization 4 (four) times daily as needed for wheezing or shortness of breath. 10/11/19  Yes [provider]  pantoprazole (PROTONIX) 40 MG tablet Take 1 tablet (40 mg total) by mouth daily. 11/06/19  Yes Cathren Laine, MD  traMADol (ULTRAM) 50 MG tablet Take 1 tablet (50 mg total) by mouth every 6 (six) hours as needed. 11/06/19  Yes Cathren Laine, MD  ondansetron (ZOFRAN ODT) 4 MG disintegrating tablet Take 1 tablet (4 mg total) by mouth every 8 (eight) hours as needed for nausea or vomiting. Patient not taking: Reported on 11/07/2019 10/15/19   Renne Crigler, PA-C  predniSONE (DELTASONE) 20 MG tablet Take 2 tablets (40 mg total) by mouth daily. Patient not taking: Reported on 11/07/2019 03/18/11   Raeford Razor, MD    Allergies    Other, Peanut-containing drug products, and Penicillins  Review of Systems   Review of Systems  Constitutional: Negative for fever.  Respiratory: Positive for wheezing. Negative for shortness of breath.   Cardiovascular: Negative for chest pain.  Gastrointestinal: Positive for abdominal pain, diarrhea, flatus, nausea and vomiting. Negative for constipation and hematemesis.  Musculoskeletal: Negative for neck pain.  All other systems reviewed and are negative.   Physical Exam Updated Vital Signs BP (!) 133/93 (BP Location: Right Arm)   Pulse 75   Temp 97.8 F (36.6 C) (Oral)   Resp (!) 11   LMP 11/02/2019   SpO2 99%   Physical Exam Vitals and nursing note reviewed.    Constitutional:      General: She is in acute distress.     Appearance: She is well-developed. She is not ill-appearing, toxic-appearing or diaphoretic.  HENT:     Head: Normocephalic and atraumatic.  Eyes:     Pupils: Pupils are equal, round, and reactive to light.  Cardiovascular:     Rate and Rhythm: Normal rate and regular rhythm.     Heart sounds: Normal heart sounds. No murmur heard.   Pulmonary:     Effort: Pulmonary effort is normal.     Breath sounds: No stridor. Wheezing present. No rhonchi or rales.  Abdominal:     General: Bowel sounds are increased. There are no signs of injury.     Palpations: Abdomen is soft. There is no splenomegaly.     Tenderness: There is generalized abdominal tenderness (worse in RUQ, tender to light palpation) and tenderness in the right upper quadrant.  Skin:    General: Skin is warm and dry.  Coloration: Skin is not pale.  Neurological:     General: No focal deficit present.     Mental Status: She is alert and oriented to person, place, and time.     Unable to perform murphy's sign as patient requested I stop pushing on her abdomen due to the pain.   ED Results / Procedures / Treatments   Labs (all labs ordered are listed, but only abnormal results are displayed) Labs Reviewed  COMPREHENSIVE METABOLIC PANEL - Abnormal; Notable for the following components:      Result Value   Glucose, Bld 68 (*)    Calcium 8.4 (*)    AST 14 (*)    Total Bilirubin 0.2 (*)    All other components within normal limits  CBC - Abnormal; Notable for the following components:   WBC 10.7 (*)    Hemoglobin 10.2 (*)    HCT 33.6 (*)    MCH 24.4 (*)    RDW 16.9 (*)    All other components within normal limits  URINALYSIS, ROUTINE W REFLEX MICROSCOPIC - Abnormal; Notable for the following components:   APPearance HAZY (*)    All other components within normal limits  RAPID URINE DRUG SCREEN, HOSP PERFORMED - Abnormal; Notable for the following  components:   Opiates POSITIVE (*)    Benzodiazepines POSITIVE (*)    All other components within normal limits  I-STAT BETA HCG BLOOD, ED ([facility], [facility], [facility] ONLY) - Abnormal; Notable for the following components:   I-stat hCG, quantitative 5.4 (*)    All other components within normal limits  GASTROINTESTINAL PANEL BY PCR, STOOL (REPLACES STOOL CULTURE)  SARS CORONAVIRUS 2 BY RT PCR (HOSPITAL ORDER, PERFORMED IN [facility])  LIPASE, BLOOD    EKG None  Radiology CT ABDOMEN PELVIS WO CONTRAST  Result Date: 11/06/2019 CLINICAL DATA:  History of cholelithiasis with right upper quadrant pain EXAM: CT ABDOMEN AND PELVIS WITHOUT CONTRAST TECHNIQUE: Multidetector CT imaging of the abdomen and pelvis was performed following the standard protocol without IV contrast. COMPARISON:  07/02/2009 FINDINGS: Lower chest: No acute abnormality. Hepatobiliary: No focal liver abnormality is seen. No gallstones, gallbladder wall thickening, or biliary dilatation. Pancreas: Unremarkable. No pancreatic ductal dilatation or surrounding inflammatory changes. Spleen: Normal in size without focal abnormality. Adrenals/Urinary Tract: Tiny nonobstructing stones are noted bilaterally. No obstructive changes are seen. Ureters are within normal limits. Bladder is partially distended. Adrenal glands are within normal limits. Stomach/Bowel: The appendix is within normal limits. No obstructive or inflammatory changes of the colon or small bowel are seen. The stomach is within normal limits. Vascular/Lymphatic: No significant vascular findings are present. No enlarged abdominal or pelvic lymph nodes. Reproductive: Uterus and bilateral adnexa are unremarkable. Other: No abdominal wall hernia or abnormality. No abdominopelvic ascites. Musculoskeletal: No acute or significant osseous findings. IMPRESSION: Tiny nonobstructing stones bilaterally. No acute abnormality is noted. Electronically Signed   By: Alcide CleverMark  Lukens M.D.   On:  11/06/2019 01:24   DG Chest 1 View  Result Date: 11/07/2019 CLINICAL DATA:  Pain EXAM: CHEST  1 VIEW COMPARISON:  October 15, 2019 FINDINGS: Lungs are clear. The heart size and pulmonary vascularity are normal. No adenopathy. No pneumothorax. No bone lesions. IMPRESSION: Lungs clear.  Cardiac silhouette within normal limits. Electronically Signed   By: Bretta BangWilliam  Woodruff III M.D.   On: 11/07/2019 10:10   US Abdomen Limited  Result Date: 11/05/2019 CLINICAL DATA:  Pain EXAM: ULTRASOUND ABDOMEN LIMITED RIGHT UPPER QUADRANT COMPARISON:  September 16, 2019 FINDINGS: Gallbladder: There is no gallbladder wall thickening. Multiple small gallbladder polyps are noted measuring up to approximately 3 mm. The sonographic Eulah Pont sign is reported as positive. Common bile duct: Diameter: 2 mm Liver: No focal lesion identified. Within normal limits in parenchymal echogenicity. Portal vein is patent on color Doppler imaging with normal direction of blood flow towards the liver. Other: None. IMPRESSION: 1. No acute abnormality. No evidence for cholelithiasis or acute cholecystitis. 2. Again noted is a small gallbladder polyp. Per consensus statement, gallbladder polyps less than 6 mm are usually benign not requiring follow-up Electronically Signed   By: Katherine Mantle M.D.   On: 11/05/2019 20:36    Procedures Procedures (including critical care time)  Medications Ordered in ED Medications  HYDROmorphone (DILAUDID) injection 1 mg (has no administration in time range)  sodium chloride flush (NS) 0.9 % injection 3 mL (3 mLs Intravenous Given 11/07/19 1037)  morphine 4 MG/ML injection 4 mg (4 mg Intravenous Given 11/07/19 1036)  ondansetron (ZOFRAN) injection 4 mg (4 mg Intravenous Given 11/07/19 1036)  lactated ringers bolus 500 mL (0 mLs Intravenous Stopped 11/07/19 1146)    ED Course  I have reviewed the triage vital signs and the nursing notes.  Pertinent labs & imaging results that were available during my care of the  patient were reviewed by me and considered in my medical decision making (see chart for details).  1200  GI Consulted. Recommended Consulting Surgery and to contact them if Surgery believes GI consult is warranted.   1225 General Surgery Consulted who state they will come and see the patient.   3:43 PM Gen Surgery PA recommend UDS and GI Stool Panel  3:43 PM Updated patient and let her know that we are waiting for Dr. Daphine Deutscher, surgeon on calls, evaluation  3:43 PM Discussed case with hospitalist who accepted patient for admission     MDM Rules/Calculators/A&P                          Janay Canan is a 39 y/o F with a PMHx of asthma and anxiety presented to the ED today with worsening right upper quadrant abdominal pain that radiates to her right shoulder for the past month with diarrhea, vomiting, and inability to tolerate food. The patient has been seen in the ED multiple times over the course of the last month. Her cbc and cmp have been unremarkable with no signs for infection,  LFT's unremarkable, normal lipase. Imaging was fairly unremarkable as well. US of the abdomen on 7/04 revealed 5mm echogenic focus of the gallbladder consistent with stone vs polyp. It was repeated on 7/25 and revealed a small gallbladder polyp less than 6mm. No evidence of cholelithiasis or acute cholecystitis. CT A/P w/o contrast revealed tiny non obstructing renal stones bilaterally. The patient was sent home on 7/25 and told to follow up with GI/Gen Surg however she states she cannot two weeks to see them with her worsening pain.   Vital signs unremarkable. Upon physical exam she has hyperactive bowel sounds, she is tender to palpate over the right upper quadrant and unable to perform murphys sign as patient requested I stop the abdominal examination as it was too painful for her.   Her CBC today was unremarkable today, wbc of 10.7, last wbc two days ago was 12.5. CMP was unremarkable with no increase in her  LFT's. Urinalysis was also unremarkable.   Unsure of etiology of patient's abdominal pain.  Low suspicion for cholecystitis, choledocholithiasis, pyelonephritis, renal calculi, sepsis, surgical abdomen, peritonitis, pneumothorax, pneumonia. Low suspicion for GERD being cause of patient's pain as Protonix prescribed did not improve patient's pain. Suspect biliary colic vs sphincter of oddi dysfunction but believe patient needs further work up. She also states worsening anxiety and recently lost one of her grandmothers. Patient also need psychiatric evaluation to assist with patient's pain.   GI consulted, recommend surgery consult. Surgery consulted and do not believe patient is a surgical candidate at this time, but do recommend patient be admitted to hospitalist service and also to order a GI panel.   Discussed case with hospitalist who accepted the patient for admission   Final Clinical Impression(s) / ED Diagnoses Final diagnoses:  Right upper quadrant abdominal pain  Diarrhea, unspecified type  Nausea and vomiting, intractability of vomiting not specified, unspecified vomiting type    Rx / DC Orders ED Discharge Orders    None       Belva Agee, MD 11/07/19 1555    Gwyneth Sprout, MD 11/08/19 [phone]

## 2019-11-07 NOTE — Consult Note (Signed)
[facility]  Rainey PinesLeanne M Tierney 07/05/1980  [id number].    Requesting MD: Gwyneth SproutWhitney Plunkett Chief Complaint: RUQ pain Reason for Consult: abdominal pain of uncertain etiology  HPI:  Patient is a 39 year old female who is presented to the ED on 10/15/19 with nausea, vomiting, and diarrhea x4 days.  Some wheezing that improved with home nebulizers.  She reported pain in her back and right shoulder.  She reported drinking several shots of hours whiskey when the pain was severe.  Labs were normal she was discharged home on Zofran and Bentyl.   She returned on 10/16/2019, for an abdominal ultrasound.  There was a single echogenic focus along the wall of the gallbladder which was nonmobile and measuring 5 mm without significant shadowing that might represent onus stone or a polyp.  No gallbladder wall thickening or adjacent free fluid.  Common bile duct was 3.1 mm.    She returned to the ED 11/05/19, with reports of a large gallstone as an outpatient ultrasound to complain that her pain had gotten worse in the right upper quadrant and right shoulder.  She also reported nausea and vomiting.  She reported loose stools taking Mylanta for what she thought was GERD, which has been much worse over the last month.  She is a vegetarian and has only been able to drink soups since this started. Repeat ultrasound on 11/05/2019, Shows there is no gallbladder wall thickening, multiple small gallbladder wall polyps measuring up to approximately 3 mm.  Murphy sign was positive.  Common bile duct was 2 mm.  Liver was normal.  Impression was no acute abnormality no evidence of cholelithiasis or acute cholecystitis just small gallbladder polyps.  A CT of the abdomen was then obtained with out contrast.  This showed no gallstones, no gallbladder wall thickening or biliary dilatation.  Pancreas was unremarkable.  There is some tiny nonobstructing stones noted bilaterally in the urinary tract.  No obstructive  changes were seen in the ureters are within normal limits the bladder was partially distended the adrenal were within normal limits.  The appendix was normal.  No obstructive or inflammatory changes of the colon or small bowel were seen.  The stomach was normal.  Uterus and adnexa bilaterally were unremarkable.  There is no abdominal wall hernia or other abnormality.  No abdominal or pelvic ascites.  Patient was treated with Zofran morphine fluids.  Patient continues to complain of pain and was tentatively scheduled for discharged with plans for GI follow-up.  She returned again this morning at 7 AM complaining of pain in the right upper quadrant.  Repeat labs today are unremarkable.  CMP shows glucose of 68, calcium of 8.4 AST of 14 total bilirubin 0.2 the remainder of the CMP is normal.  Lipase is 27.  WBC is 10.7, hemoglobin 10.2, hematocrit 33.6, platelets are 274,000.  hCG 5.4.  Urinalysis is negative CXR is unremarkable.  We are asked to see  ROS: Review of Systems  Constitutional: Negative for chills, diaphoresis, fever, malaise/fatigue and weight loss.  HENT: Negative.   Eyes: Negative.   Respiratory: Positive for wheezing (uses her nebs for this). Negative for cough, hemoptysis, sputum production and shortness of breath.        No sleep apnea/snoring  Cardiovascular: Positive for orthopnea, leg swelling and PND.  Gastrointestinal: Positive for abdominal pain (RUQ), diarrhea (for about 1 month), heartburn (has been bad for about a month, worse with n/v), nausea and vomiting.  Genitourinary: Negative.   Musculoskeletal:  Negative.   Skin: Negative.   Neurological: Positive for headaches (Hx of migraines as a child, came back and had one about 1 week ago).  Endo/Heme/Allergies: Negative.   Psychiatric/Behavioral: Positive for depression. The patient is nervous/anxious.        Home schooling, with her son all the time. Grandmother died 11/05/2019 - she had a panic attack at that time also     No family history on file.  Past Medical History:  Diagnosis Date  . Asthma     No past surgical history on file.  Social History:  reports that she has been smoking. She has never used smokeless tobacco. She reports current alcohol use. She reports that she does not use drugs.   Tobacco:  Yes ETOH:  Yes, but not a usual habit Drugs:  None She lives with her fiance and son age 15. She is home schooling.  She was an [employer],but there has been no work for over a year now.     Allergies:  Allergies  Allergen Reactions  . Other Shortness Of Breath and Hives  . Peanut-Containing Drug Products Hives and Shortness Of Breath  . Penicillins Hives, Nausea And Vomiting and Anaphylaxis    Other reaction(s): RASH Other reaction(s): RASH     Prior to Admission medications   Medication Sig Start Date End Date Taking? Authorizing Provider  ADVAIR DISKUS 500-50 MCG/DOSE AEPB Inhale 1 puff into the lungs 2 (two) times daily. 10/11/19  Yes [provider]  albuterol (PROVENTIL HFA;VENTOLIN HFA) 108 (90 BASE) MCG/ACT inhaler Inhale 2 puffs into the lungs every 6 (six) hours as needed. For shortness of breath    Yes [provider]  alprazolam Prudy Feeler) 2 MG tablet Take 2 mg by mouth 3 (three) times daily as needed for anxiety. 10/16/19  Yes [provider]  alum & mag hydroxide-simeth (MAALOX/MYLANTA) 200-200-20 MG/5ML suspension Take 15 mLs by mouth every 6 (six) hours as needed for indigestion or heartburn.   Yes [provider]  dicyclomine (BENTYL) 20 MG tablet Take 1 tablet (20 mg total) by mouth 2 (two) times daily. Patient taking differently: Take 20 mg by mouth 2 (two) times daily as needed for spasms.  10/15/19  Yes Renne Crigler, PA-C  ibuprofen (ADVIL) 200 MG tablet Take 400 mg by mouth every 6 (six) hours as needed for headache or mild pain.   Yes [provider]  ipratropium-albuterol (DUONEB) 0.5-2.5 (3) MG/3ML SOLN Take 3 mLs by  nebulization 4 (four) times daily as needed for wheezing or shortness of breath. 10/11/19  Yes [provider]  pantoprazole (PROTONIX) 40 MG tablet Take 1 tablet (40 mg total) by mouth daily. 11/06/19  Yes Cathren Laine, MD  traMADol (ULTRAM) 50 MG tablet Take 1 tablet (50 mg total) by mouth every 6 (six) hours as needed. 11/06/19  Yes Cathren Laine, MD  ondansetron (ZOFRAN ODT) 4 MG disintegrating tablet Take 1 tablet (4 mg total) by mouth every 8 (eight) hours as needed for nausea or vomiting. Patient not taking: Reported on 11/07/2019 10/15/19   Renne Crigler, PA-C  predniSONE (DELTASONE) 20 MG tablet Take 2 tablets (40 mg total) by mouth daily. Patient not taking: Reported on 11/07/2019 03/18/11   Raeford Razor, MD     Blood pressure (!) 137/82, pulse 91, temperature 97.9 F (36.6 C), temperature source Oral, resp. rate 14, last menstrual period 11/02/2019, SpO2 99 %. Physical Exam:  General: pleasant, WD, WN white female who is laying in bed in  NAD, obese BMI 37.41 HEENT: head is normocephalic, atraumatic.  Sclera are noninjected.  Pupils are equal.  Ears and nose without any masses or lesions.  Mouth is pink and moist Heart: regular, rate, and rhythm.  Normal s1,s2. No obvious murmurs, gallops, or rubs noted. Slightly tachycardic, Palpable radial and pedal pulses bilaterally Lungs: CTAB, no wheezes, rhonchi, or rales noted.  Respiratory effort nonlabored Abd: soft, She is tender in the RUQ, but she has no distension or peritonitis,  +BS, no masses, small umbilical hernia, or organomegaly. MS: all 4 extremities are symmetrical with no cyanosis, clubbing, or edema. Skin: warm and dry with no masses, lesions, or rashes Neuro: Cranial nerves 2-12 grossly intact, sensation is normal throughout Psych: A&Ox3 with an appropriate affect.   Results for orders placed or performed during the hospital encounter of 11/07/19 (from the past 48 hour(s))  Urinalysis, Routine w reflex microscopic      Status: Abnormal   Collection Time: 11/07/19  9:12 AM  Result Value Ref Range   Color, Urine YELLOW YELLOW   APPearance HAZY (A) CLEAR   Specific Gravity, Urine 1.021 1.005 - 1.030   pH 5.0 5.0 - 8.0   Glucose, UA NEGATIVE NEGATIVE mg/dL   Hgb urine dipstick NEGATIVE NEGATIVE   Bilirubin Urine NEGATIVE NEGATIVE   Ketones, ur NEGATIVE NEGATIVE mg/dL   Protein, ur NEGATIVE NEGATIVE mg/dL   Nitrite NEGATIVE NEGATIVE   Leukocytes,Ua NEGATIVE NEGATIVE    Comment: Performed at [facility], [address], [place], [place] [address]  Lipase, blood     Status: None   Collection Time: 11/07/19 10:34 AM  Result Value Ref Range   Lipase 27 11 - 51 U/L    Comment: Performed at [facility], [address], [place], [place] [address]  Comprehensive metabolic panel     Status: Abnormal   Collection Time: 11/07/19 10:34 AM  Result Value Ref Range   Sodium 141 135 - 145 mmol/L   Potassium 3.5 3.5 - 5.1 mmol/L   Chloride 106 98 - 111 mmol/L   CO2 24 22 - 32 mmol/L   Glucose, Bld 68 (L) 70 - 99 mg/dL    Comment: Glucose reference range applies only to samples taken after fasting for at least 8 hours.   BUN 7 6 - 20 mg/dL   Creatinine, Ser 9.56 0.44 - 1.00 mg/dL   Calcium 8.4 (L) 8.9 - 10.3 mg/dL   Total Protein 6.7 6.5 - 8.1 g/dL   Albumin 3.7 3.5 - 5.0 g/dL   AST 14 (L) 15 - 41 U/L   ALT 12 0 - 44 U/L   Alkaline Phosphatase 48 38 - 126 U/L   Total Bilirubin 0.2 (L) 0.3 - 1.2 mg/dL   GFR calc non Af Amer >60 >60 mL/min   GFR calc Af Amer >60 >60 mL/min   Anion gap 11 5 - 15    Comment: Performed at [facility], [address], [place], [place] [address]  CBC     Status: Abnormal   Collection Time: 11/07/19 10:34 AM  Result Value Ref Range   WBC 10.7 (H) 4.0 - 10.5 K/uL   RBC 4.18 3.87 - 5.11 MIL/uL   Hemoglobin 10.2 (L) 12.0 - 15.0 g/dL   HCT 43.3 (L) 36 - 46 %   MCV 80.4 80.0 - 100.0 fL   MCH 24.4 (L) 26.0 - 34.0 pg   MCHC  30.4 30.0 - 36.0 g/dL   RDW 29.5 (  H) 11.5 - 15.5 %   Platelets 274 150 - 400 K/uL   nRBC 0.0 0.0 - 0.2 %    Comment: Performed at [facility], [address] [place], [place] [address]  I-Stat beta hCG blood, ED     Status: Abnormal   Collection Time: 11/07/19 10:45 AM  Result Value Ref Range   I-stat hCG, quantitative 5.4 (H) <5 mIU/mL   Comment 3            Comment:   GEST. AGE      CONC.  (mIU/mL)   <=1 WEEK        5 - 50     2 WEEKS       50 - 500     3 WEEKS       100 - 10,000     4 WEEKS     1,000 - 30,000        FEMALE AND NON-PREGNANT FEMALE:     LESS THAN 5 mIU/mL    CT ABDOMEN PELVIS WO CONTRAST  Result Date: 11/06/2019 CLINICAL DATA:  History of cholelithiasis with right upper quadrant pain EXAM: CT ABDOMEN AND PELVIS WITHOUT CONTRAST TECHNIQUE: Multidetector CT imaging of the abdomen and pelvis was performed following the standard protocol without IV contrast. COMPARISON:  07/02/2009 FINDINGS: Lower chest: No acute abnormality. Hepatobiliary: No focal liver abnormality is seen. No gallstones, gallbladder wall thickening, or biliary dilatation. Pancreas: Unremarkable. No pancreatic ductal dilatation or surrounding inflammatory changes. Spleen: Normal in size without focal abnormality. Adrenals/Urinary Tract: Tiny nonobstructing stones are noted bilaterally. No obstructive changes are seen. Ureters are within normal limits. Bladder is partially distended. Adrenal glands are within normal limits. Stomach/Bowel: The appendix is within normal limits. No obstructive or inflammatory changes of the colon or small bowel are seen. The stomach is within normal limits. Vascular/Lymphatic: No significant vascular findings are present. No enlarged abdominal or pelvic lymph nodes. Reproductive: Uterus and bilateral adnexa are unremarkable. Other: No abdominal wall hernia or abnormality. No abdominopelvic ascites. Musculoskeletal: No acute or significant osseous findings.  IMPRESSION: Tiny nonobstructing stones bilaterally. No acute abnormality is noted. Electronically Signed   By: Alcide Clever M.D.   On: 11/06/2019 01:24   DG Chest 1 View  Result Date: 11/07/2019 CLINICAL DATA:  Pain EXAM: CHEST  1 VIEW COMPARISON:  October 15, 2019 FINDINGS: Lungs are clear. The heart size and pulmonary vascularity are normal. No adenopathy. No pneumothorax. No bone lesions. IMPRESSION: Lungs clear.  Cardiac silhouette within normal limits. Electronically Signed   By: Bretta Bang III M.D.   On: 11/07/2019 10:10   US Abdomen Limited  Result Date: 11/05/2019 CLINICAL DATA:  Pain EXAM: ULTRASOUND ABDOMEN LIMITED RIGHT UPPER QUADRANT COMPARISON:  September 16, 2019 FINDINGS: Gallbladder: There is no gallbladder wall thickening. Multiple small gallbladder polyps are noted measuring up to approximately 3 mm. The sonographic Eulah Pont sign is reported as positive. Common bile duct: Diameter: 2 mm Liver: No focal lesion identified. Within normal limits in parenchymal echogenicity. Portal vein is patent on color Doppler imaging with normal direction of blood flow towards the liver. Other: None. IMPRESSION: 1. No acute abnormality. No evidence for cholelithiasis or acute cholecystitis. 2. Again noted is a small gallbladder polyp. Per consensus statement, gallbladder polyps less than 6 mm are usually benign not requiring follow-up Electronically Signed   By: Katherine Mantle M.D.   On: 11/05/2019 20:36      Assessment/Plan Hx asthma Hx Migraine BMI 37.41  Abdominal pain, nausea, vomiting;  Diarrhea x 4 weeks GERD  - Gallbladder polyps ~36mm;  no gallstones; CBD 2 mm  - CT no gallstones/gallbladder wall thickening/biliary dilatation.  Pancreas unremarkable no    ductal dilatation/inflammatory changes.  Adrenal/urinary tract: Tiny nonobstructing stones noted bilaterally no obstructive changes seen ureters are within normal limits bladder is partially distended adrenals are normal.  Appendix is  normal no obstructive or inflammatory changes of the colon or small bowel.  Stomach within normal limits.  Uterus/adnexa are unremarkable no abdominal wall hernia or abdominal pelvic ascites.  - normal LFT/lipase   Plan:  I do not know what is causing her symptoms.  I have recommended a GI panel and it is ordered.  I will review with DR. Daphine Deutscher.  I would recommend that she be admitted to Medicine and see if we can get her hydrated. She has been living on vegetable broth. She is unaware of losing any weight, and added weight over the last year.  Add PPI and perhaps Carafate; see if this relieves her symptoms.  Consider GI consult for EGD if her GI panel is negative.       Sherrie George [facility] 11/07/2019, 12:14 PM Please see Amion for pager number during day hours 7:00am-4:30pm

## 2019-11-07 NOTE — H&P (Signed)
History and Physical        Hospital Admission Note Date: 11/07/2019  Patient name: Vicki Moore Medical record number: [id number] Date of birth: [birth year] Age: 39 y.o. Gender: female  PCP: Patient, No Pcp Per  Patient coming from: Home At baseline, ambulates: Independently  Chief Complaint    Chief Complaint  Patient presents with  . Abdominal Pain      HPI:   This is a very pleasant 39 year old female with a history of migraines, asthma, GERD and anxiety who presented to the ED with persistent RUQ abdominal pain with radiation to right shoulder and decreased p.o. intake.   She has been seen in the ED over the past month several times for similar issues most notably on 7/4 at which point her vitals were stable and without fever.  RUQ on 7/5 ultrasound revealed single 5 mm focus which could be stone versus polyp in the gallbladder, common bile duct was 3.1 mm.  She was discharged with Zofran and Bentyl.  She returned on 7/25 with reports of a large gallstone on an outpatient ultrasound with worsening RUQ pain and right shoulder pain.  Also reported nausea and vomiting at the time for which she was taking Mylanta without much relief.  Had an abdominal ultrasound on 7/25 with no gallbladder wall thickening and multiple small gallbladder wall polyps measuring approximately 3 mm with positive Murphy sign, CBD 2 mm without cholecystitis or cholelithiasis.  CT scan abdomen on 7/25 showed nonobstructing nephrolithiasis bilaterally and otherwise unremarkable.  She was discharged with plans for GI follow-up.  Unfortunately, she returned again today with persistent RUQ pain and radiation to right shoulder.  She has only been able to have chicken broth and is unable to keep anything else down.  Of note, she used to be a Horticulturist, commercial but injured herself two years ago and has since been more  sedentary and has gained 60+ pounds this year which is causing her significant stress on top of her current issues.  ED Course: Vital signs unremarkable, WBC 12.5 on 7/25-> 10.7 today.  Hb 10.2, down from 11.32 days ago.  Glucose 68, beta hCG borderline elevated at 5.4 (n <5). ED MD discussed with GI who recommended surgery consult who ordered GI panel and PPI.  Vitals:   11/07/19 1653 11/07/19 1758  BP: (!) 130/83 128/85  Pulse: 75 77  Resp: 16 (!) 9  Temp: 97.8 F (36.6 C)   SpO2: 98% 93%     Review of Systems:  Review of Systems  Constitutional: Negative for chills, fever and weight loss.  HENT: Negative.   Eyes: Negative.   Respiratory: Negative for shortness of breath and wheezing.   Cardiovascular: Negative for chest pain and palpitations.  Gastrointestinal: Positive for abdominal pain and heartburn.  Genitourinary: Negative.   Musculoskeletal: Negative.   Skin:       Reports a rash two months ago which has resolved  Psychiatric/Behavioral:       +anxiety    Medical/Social/Family History   Past Medical History: Past Medical History:  Diagnosis Date  . Asthma     History reviewed. No pertinent surgical history.  Medications: Prior to Admission medications   Medication Sig Start Date  End Date Taking? Authorizing Provider  ADVAIR DISKUS 500-50 MCG/DOSE AEPB Inhale 1 puff into the lungs 2 (two) times daily. 10/11/19  Yes [provider]  albuterol (PROVENTIL HFA;VENTOLIN HFA) 108 (90 BASE) MCG/ACT inhaler Inhale 2 puffs into the lungs every 6 (six) hours as needed. For shortness of breath    Yes [provider]  alprazolam Prudy Feeler(XANAX) 2 MG tablet Take 2 mg by mouth 3 (three) times daily as needed for anxiety. 10/16/19  Yes [provider]  alum & mag hydroxide-simeth (MAALOX/MYLANTA) 200-200-20 MG/5ML suspension Take 15 mLs by mouth every 6 (six) hours as needed for indigestion or heartburn.   Yes [provider]  dicyclomine (BENTYL) 20  MG tablet Take 1 tablet (20 mg total) by mouth 2 (two) times daily. Patient taking differently: Take 20 mg by mouth 2 (two) times daily as needed for spasms.  10/15/19  Yes Renne CriglerGeiple, Joshua, PA-C  ibuprofen (ADVIL) 200 MG tablet Take 400 mg by mouth every 6 (six) hours as needed for headache or mild pain.   Yes [provider]  ipratropium-albuterol (DUONEB) 0.5-2.5 (3) MG/3ML SOLN Take 3 mLs by nebulization 4 (four) times daily as needed for wheezing or shortness of breath. 10/11/19  Yes [provider]  pantoprazole (PROTONIX) 40 MG tablet Take 1 tablet (40 mg total) by mouth daily. 11/06/19  Yes Cathren LaineSteinl, Kevin, MD  traMADol (ULTRAM) 50 MG tablet Take 1 tablet (50 mg total) by mouth every 6 (six) hours as needed. 11/06/19  Yes Cathren LaineSteinl, Kevin, MD  ondansetron (ZOFRAN ODT) 4 MG disintegrating tablet Take 1 tablet (4 mg total) by mouth every 8 (eight) hours as needed for nausea or vomiting. Patient not taking: Reported on 11/07/2019 10/15/19   Renne CriglerGeiple, Joshua, PA-C  predniSONE (DELTASONE) 20 MG tablet Take 2 tablets (40 mg total) by mouth daily. Patient not taking: Reported on 11/07/2019 03/18/11   Raeford RazorKohut, Stephen, MD    Allergies:   Allergies  Allergen Reactions  . Other Shortness Of Breath and Hives  . Peanut-Containing Drug Products Hives and Shortness Of Breath  . Penicillins Hives, Nausea And Vomiting and Anaphylaxis    Other reaction(s): RASH Other reaction(s): RASH     Social History:  reports that she has been smoking. She has never used smokeless tobacco. She reports current alcohol use. She reports that she does not use drugs.  Family History: History reviewed. No pertinent family history.   Objective   Physical Exam: Blood pressure 128/85, pulse 77, temperature 97.8 F (36.6 C), temperature source Oral, resp. rate (!) 9, last menstrual period 11/02/2019, SpO2 93 %.  Physical Exam Vitals and nursing note reviewed.  Constitutional:      Appearance: Normal appearance.  She is obese.  HENT:     Head: Normocephalic and atraumatic.  Eyes:     Conjunctiva/sclera: Conjunctivae normal.  Cardiovascular:     Rate and Rhythm: Normal rate and regular rhythm.  Pulmonary:     Effort: Pulmonary effort is normal.     Breath sounds: Normal breath sounds.  Abdominal:     Palpations: Abdomen is soft.     Tenderness: There is abdominal tenderness in the right upper quadrant. There is guarding.  Musculoskeletal:        General: No swelling or tenderness.  Skin:    Coloration: Skin is not jaundiced or pale.  Neurological:     Mental Status: She is alert. Mental status is at baseline.  Psychiatric:        Attention and  Perception: Attention and perception normal.        Mood and Affect: Mood is anxious. Affect is tearful.        Behavior: Behavior normal. Behavior is cooperative.     LABS on Admission: I have personally reviewed all the labs and imaging below    Basic Metabolic Panel: Recent Labs  Lab 11/05/19 2241 11/07/19 1034  NA 138 141  K 4.4 3.5  CL 104 106  CO2 23 24  GLUCOSE 116* 68*  BUN 14 7  CREATININE 0.74 0.59  CALCIUM 9.0 8.4*   Liver Function Tests: Recent Labs  Lab 11/05/19 2241 11/07/19 1034  AST 14* 14*  ALT 13 12  ALKPHOS 58 48  BILITOT 0.4 0.2*  PROT 7.4 6.7  ALBUMIN 4.1 3.7   Recent Labs  Lab 11/05/19 2241 11/07/19 1034  LIPASE 27 27   No results for input(s): AMMONIA in the last 168 hours. CBC: Recent Labs  Lab 11/05/19 2241 11/05/19 2241 11/07/19 1034  WBC 12.5*  --  10.7*  NEUTROABS 11.2*  --   --   HGB 11.3*  --  10.2*  HCT 36.3  --  33.6*  MCV 79.4*   < > 80.4  PLT 312  --  274   < > = values in this interval not displayed.   Cardiac Enzymes: No results for input(s): CKTOTAL, CKMB, CKMBINDEX, TROPONINI in the last 168 hours. BNP: Invalid input(s): POCBNP CBG: No results for input(s): GLUCAP in the last 168 hours.  Radiological Exams on Admission:  CT ABDOMEN PELVIS WO CONTRAST  Result Date:  11/06/2019 CLINICAL DATA:  History of cholelithiasis with right upper quadrant pain EXAM: CT ABDOMEN AND PELVIS WITHOUT CONTRAST TECHNIQUE: Multidetector CT imaging of the abdomen and pelvis was performed following the standard protocol without IV contrast. COMPARISON:  07/02/2009 FINDINGS: Lower chest: No acute abnormality. Hepatobiliary: No focal liver abnormality is seen. No gallstones, gallbladder wall thickening, or biliary dilatation. Pancreas: Unremarkable. No pancreatic ductal dilatation or surrounding inflammatory changes. Spleen: Normal in size without focal abnormality. Adrenals/Urinary Tract: Tiny nonobstructing stones are noted bilaterally. No obstructive changes are seen. Ureters are within normal limits. Bladder is partially distended. Adrenal glands are within normal limits. Stomach/Bowel: The appendix is within normal limits. No obstructive or inflammatory changes of the colon or small bowel are seen. The stomach is within normal limits. Vascular/Lymphatic: No significant vascular findings are present. No enlarged abdominal or pelvic lymph nodes. Reproductive: Uterus and bilateral adnexa are unremarkable. Other: No abdominal wall hernia or abnormality. No abdominopelvic ascites. Musculoskeletal: No acute or significant osseous findings. IMPRESSION: Tiny nonobstructing stones bilaterally. No acute abnormality is noted. Electronically Signed   By: Alcide Clever M.D.   On: 11/06/2019 01:24   DG Chest 1 View  Result Date: 11/07/2019 CLINICAL DATA:  Pain EXAM: CHEST  1 VIEW COMPARISON:  October 15, 2019 FINDINGS: Lungs are clear. The heart size and pulmonary vascularity are normal. No adenopathy. No pneumothorax. No bone lesions. IMPRESSION: Lungs clear.  Cardiac silhouette within normal limits. Electronically Signed   By: Bretta Bang III M.D.   On: 11/07/2019 10:10   US Abdomen Limited  Result Date: 11/05/2019 CLINICAL DATA:  Pain EXAM: ULTRASOUND ABDOMEN LIMITED RIGHT UPPER QUADRANT  COMPARISON:  September 16, 2019 FINDINGS: Gallbladder: There is no gallbladder wall thickening. Multiple small gallbladder polyps are noted measuring up to approximately 3 mm. The sonographic Eulah Pont sign is reported as positive. Common bile duct: Diameter: 2 mm Liver: No focal lesion identified.  Within normal limits in parenchymal echogenicity. Portal vein is patent on color Doppler imaging with normal direction of blood flow towards the liver. Other: None. IMPRESSION: 1. No acute abnormality. No evidence for cholelithiasis or acute cholecystitis. 2. Again noted is a small gallbladder polyp. Per consensus statement, gallbladder polyps less than 6 mm are usually benign not requiring follow-up Electronically Signed   By: Katherine Mantle M.D.   On: 11/05/2019 20:36      EKG: Sinus tach on tele, ekg ordered   A & P   Principal Problem:   RUQ pain Active Problems:   Anxiety state   Asthma   GERD (gastroesophageal reflux disease)   1. Intractable RUQ pain with radiation to right shoulder, unknown etiology a. Ddx: PUD vs. H pylori vs. Unlikely abdominal migraines vs. Unlikely endometriosis vs. other b. All imaging relatively unremarkable with small GB polyps on [place] c. WBC was slightly elevated two days ago 12->10 today.  d. General surgery consulted, added GI panel e. Will make PPI BID as she haas significant gerd f. I tried reaching out to GI but did not get a response. Would recommend reaching out in the AM if no improvement g. She will likely need an EGD h. Has been getting PPI so will hold off on H pylori testing for now i. PRN dilaudid j. Clear liquid diet k. Will make NPO after midnight in case GI wants to do a procedure tomorrow  2. Anxiety a. TSH b. Ativan PRN c. Consider starting SSRI at discharge if she is tolerating PO  3. GERD a. Increase PPI BID  4. Asthma a. Continue home inhalers   DVT prophylaxis: SCD   Code Status: Full Code  Diet: clears Family Communication:  Admission, patients condition and plan of care including tests being ordered have been discussed with the patient who indicates understanding and agrees with the plan and Code Status. Disposition Plan: The appropriate patient status for this patient is OBSERVATION. Observation status is judged to be reasonable and necessary in order to provide the required intensity of service to ensure the patient's safety. The patient's presenting symptoms, physical exam findings, and initial radiographic and laboratory data in the context of their medical condition is felt to place them at decreased risk for further clinical deterioration. Furthermore, it is anticipated that the patient will be medically stable for discharge from the hospital within 2 midnights of admission. The following factors support the patient status of observation.   " The patient's presenting symptoms include intractable RUQ pain. " The physical exam findings include RUQ pain, guarding. " The initial radiographic and laboratory data are unremarkable    Status is: Observation  The patient remains OBS appropriate and will d/c before 2 midnights.  Dispo: The patient is from: Home              Anticipated d/c is to: Home              Anticipated d/c date is: 2 days              Patient currently is not medically stable to d/c.     Consultants  . General Surgery  Procedures  . None  Time Spent on Admission: 66 minutes    Jae Dire, DO [facility] Pager [phone] 11/07/2019, 7:13 PM

## 2019-11-07 NOTE — ED Notes (Signed)
Transport called to take pt to floor.  

## 2019-11-07 NOTE — ED Notes (Signed)
ED TO INPATIENT HANDOFF REPORT  Name/Age/Gender Vicki Moore 39 y.o. female  Code Status    Code Status Orders  (From admission, onward)         Start     Ordered   11/07/19 1911  Full code  Continuous        11/07/19 1910        Code Status History    This patient has a current code status but no historical code status.   Advance Care Planning Activity      Home/SNF/Other Home  Chief Complaint RUQ pain [R10.11]  Level of Care/Admitting Diagnosis ED Disposition    ED Disposition Condition Comment   Admit  Hospital Area: [facility] [[id number]]  Level of Care: Med-Surg [16]  Covid Evaluation: Asymptomatic Screening Protocol (No Symptoms)  Diagnosis: RUQ pain [619509]  Admitting Physician: Jae Dire [[id number]]  Attending Physician: Jae Dire [[id number]]       Medical History Past Medical History:  Diagnosis Date  . Asthma     Allergies Allergies  Allergen Reactions  . Other Shortness Of Breath and Hives  . Peanut-Containing Drug Products Hives and Shortness Of Breath  . Penicillins Hives, Nausea And Vomiting and Anaphylaxis    Other reaction(s): RASH Other reaction(s): RASH     IV Location/Drains/Wounds Patient Lines/Drains/Airways Status    Active Line/Drains/Airways    Name Placement date Placement time Site Days   Peripheral IV 11/07/19 Right Forearm 11/07/19  1035  Forearm  less than 1          Labs/Imaging Results for orders placed or performed during the hospital encounter of 11/07/19 (from the past 48 hour(s))  Urinalysis, Routine w reflex microscopic     Status: Abnormal   Collection Time: 11/07/19  9:12 AM  Result Value Ref Range   Color, Urine YELLOW YELLOW   APPearance HAZY (A) CLEAR   Specific Gravity, Urine 1.021 1.005 - 1.030   pH 5.0 5.0 - 8.0   Glucose, UA NEGATIVE NEGATIVE mg/dL   Hgb urine dipstick NEGATIVE NEGATIVE   Bilirubin Urine NEGATIVE NEGATIVE   Ketones, ur NEGATIVE NEGATIVE mg/dL    Protein, ur NEGATIVE NEGATIVE mg/dL   Nitrite NEGATIVE NEGATIVE   Leukocytes,Ua NEGATIVE NEGATIVE    Comment: Performed at [facility], [address], [place], [place] [address]  Rapid urine drug screen (hospital performed)     Status: Abnormal   Collection Time: 11/07/19  9:51 AM  Result Value Ref Range   Opiates POSITIVE (A) NONE DETECTED   Cocaine NONE DETECTED NONE DETECTED   Benzodiazepines POSITIVE (A) NONE DETECTED   Amphetamines NONE DETECTED NONE DETECTED   Tetrahydrocannabinol NONE DETECTED NONE DETECTED   Barbiturates NONE DETECTED NONE DETECTED    Comment: (NOTE) DRUG SCREEN FOR MEDICAL PURPOSES ONLY.  IF CONFIRMATION IS NEEDED FOR ANY PURPOSE, NOTIFY LAB WITHIN 5 DAYS.  LOWEST DETECTABLE LIMITS FOR URINE DRUG SCREEN Drug Class                     Cutoff (ng/mL) Amphetamine and metabolites    1000 Barbiturate and metabolites    200 Benzodiazepine                 200 Tricyclics and metabolites     300 Opiates and metabolites        300 Cocaine and metabolites        300 THC  50 Performed at [facility], [address] [place], [place] [address]   Lipase, blood     Status: None   Collection Time: 11/07/19 10:34 AM  Result Value Ref Range   Lipase 27 11 - 51 U/L    Comment: Performed at [facility], [address] [place], [place] [address]  Comprehensive metabolic panel     Status: Abnormal   Collection Time: 11/07/19 10:34 AM  Result Value Ref Range   Sodium 141 135 - 145 mmol/L   Potassium 3.5 3.5 - 5.1 mmol/L   Chloride 106 98 - 111 mmol/L   CO2 24 22 - 32 mmol/L   Glucose, Bld 68 (L) 70 - 99 mg/dL    Comment: Glucose reference range applies only to samples taken after fasting for at least 8 hours.   BUN 7 6 - 20 mg/dL   Creatinine, Ser 1.85 0.44 - 1.00 mg/dL   Calcium 8.4 (L) 8.9 - 10.3 mg/dL   Total Protein 6.7 6.5 - 8.1 g/dL   Albumin 3.7 3.5 - 5.0 g/dL   AST  14 (L) 15 - 41 U/L   ALT 12 0 - 44 U/L   Alkaline Phosphatase 48 38 - 126 U/L   Total Bilirubin 0.2 (L) 0.3 - 1.2 mg/dL   GFR calc non Af Amer >60 >60 mL/min   GFR calc Af Amer >60 >60 mL/min   Anion gap 11 5 - 15    Comment: Performed at [facility], [address], [place], [place] [address]  CBC     Status: Abnormal   Collection Time: 11/07/19 10:34 AM  Result Value Ref Range   WBC 10.7 (H) 4.0 - 10.5 K/uL   RBC 4.18 3.87 - 5.11 MIL/uL   Hemoglobin 10.2 (L) 12.0 - 15.0 g/dL   HCT 70.2 (L) 36 - 46 %   MCV 80.4 80.0 - 100.0 fL   MCH 24.4 (L) 26.0 - 34.0 pg   MCHC 30.4 30.0 - 36.0 g/dL   RDW 63.7 (H) 85.8 - 85.0 %   Platelets 274 150 - 400 K/uL   nRBC 0.0 0.0 - 0.2 %    Comment: Performed at [facility], [address] [place], [place] [address]  I-Stat beta hCG blood, ED     Status: Abnormal   Collection Time: 11/07/19 10:45 AM  Result Value Ref Range   I-stat hCG, quantitative 5.4 (H) <5 mIU/mL   Comment 3            Comment:   GEST. AGE      CONC.  (mIU/mL)   <=1 WEEK        5 - 50     2 WEEKS       50 - 500     3 WEEKS       100 - 10,000     4 WEEKS     1,000 - 30,000        FEMALE AND NON-PREGNANT FEMALE:     LESS THAN 5 mIU/mL    CT ABDOMEN PELVIS WO CONTRAST  Result Date: 11/06/2019 CLINICAL DATA:  History of cholelithiasis with right upper quadrant pain EXAM: CT ABDOMEN AND PELVIS WITHOUT CONTRAST TECHNIQUE: Multidetector CT imaging of the abdomen and pelvis was performed following the standard protocol without IV contrast. COMPARISON:  07/02/2009 FINDINGS: Lower chest: No acute abnormality. Hepatobiliary: No focal liver abnormality is seen. No gallstones, gallbladder wall thickening, or biliary dilatation. Pancreas:  Unremarkable. No pancreatic ductal dilatation or surrounding inflammatory changes. Spleen: Normal in size without focal abnormality. Adrenals/Urinary Tract: Tiny nonobstructing stones are noted bilaterally. No  obstructive changes are seen. Ureters are within normal limits. Bladder is partially distended. Adrenal glands are within normal limits. Stomach/Bowel: The appendix is within normal limits. No obstructive or inflammatory changes of the colon or small bowel are seen. The stomach is within normal limits. Vascular/Lymphatic: No significant vascular findings are present. No enlarged abdominal or pelvic lymph nodes. Reproductive: Uterus and bilateral adnexa are unremarkable. Other: No abdominal wall hernia or abnormality. No abdominopelvic ascites. Musculoskeletal: No acute or significant osseous findings. IMPRESSION: Tiny nonobstructing stones bilaterally. No acute abnormality is noted. Electronically Signed   By: Alcide Clever M.D.   On: 11/06/2019 01:24   DG Chest 1 View  Result Date: 11/07/2019 CLINICAL DATA:  Pain EXAM: CHEST  1 VIEW COMPARISON:  October 15, 2019 FINDINGS: Lungs are clear. The heart size and pulmonary vascularity are normal. No adenopathy. No pneumothorax. No bone lesions. IMPRESSION: Lungs clear.  Cardiac silhouette within normal limits. Electronically Signed   By: Bretta Bang III M.D.   On: 11/07/2019 10:10   US Abdomen Limited  Result Date: 11/05/2019 CLINICAL DATA:  Pain EXAM: ULTRASOUND ABDOMEN LIMITED RIGHT UPPER QUADRANT COMPARISON:  September 16, 2019 FINDINGS: Gallbladder: There is no gallbladder wall thickening. Multiple small gallbladder polyps are noted measuring up to approximately 3 mm. The sonographic Eulah Pont sign is reported as positive. Common bile duct: Diameter: 2 mm Liver: No focal lesion identified. Within normal limits in parenchymal echogenicity. Portal vein is patent on color Doppler imaging with normal direction of blood flow towards the liver. Other: None. IMPRESSION: 1. No acute abnormality. No evidence for cholelithiasis or acute cholecystitis. 2. Again noted is a small gallbladder polyp. Per consensus statement, gallbladder polyps less than 6 mm are usually benign not  requiring follow-up Electronically Signed   By: Katherine Mantle M.D.   On: 11/05/2019 20:36    Pending Labs Unresulted Labs (From admission, onward) Comment          Start     Ordered   11/08/19 0500  Comprehensive metabolic panel  Tomorrow morning,   R        11/07/19 1910   11/08/19 0500  CBC  Tomorrow morning,   R        11/07/19 1910   11/07/19 1911  HIV Antibody (routine testing w rflx)  (HIV Antibody (Routine testing w reflex) panel)  Once,   STAT        11/07/19 1910   11/07/19 1858  TSH  Add-on,   AD        11/07/19 1857   11/07/19 1506  SARS Coronavirus 2 by RT PCR (hospital order, performed in [facility] hospital lab) Nasopharyngeal Nasopharyngeal Swab  (Tier 2 (TAT 2 hrs))  Once,   STAT       Question Answer Comment  Is this test for diagnosis or screening Screening   Symptomatic for COVID-19 as defined by CDC No   Hospitalized for COVID-19 No   Admitted to ICU for COVID-19 No   Previously tested for COVID-19 Unknown   Resident in a congregate (group) care setting Unknown   Employed in healthcare setting Unknown   Pregnant Unknown   Has patient completed COVID vaccination(s) (2 doses of Pfizer/Moderna 1 dose of [employer]) Unknown      11/07/19 1506   11/07/19 1239  Gastrointestinal Panel by PCR ,  Stool  (Gastrointestinal Panel by PCR, Stool                                                                                                                                                     *Does Not include CLOSTRIDIUM DIFFICILE testing.**If CDIFF testing is needed, select the C Difficile Quick Screen w PCR reflex order below)  Once,   STAT        11/07/19 1238          Vitals/Pain Today's Vitals   11/07/19 1402 11/07/19 1501 11/07/19 1653 11/07/19 1758  BP: (!) 133/93 (!) 133/93 (!) 130/83 128/85  Pulse: 90 75 75 77  Resp: 12 (!) 11 16 (!) 9  Temp:  97.8 F (36.6 C) 97.8 F (36.6 C)   TempSrc:  Oral Oral   SpO2: 94% 99% 98% 93%    Isolation  Precautions No active isolations  Medications Medications  dicyclomine (BENTYL) tablet 20 mg (has no administration in time range)  fluticasone furoate-vilanterol (BREO ELLIPTA) 200-25 MCG/INH 1 puff (has no administration in time range)  albuterol (VENTOLIN HFA) 108 (90 Base) MCG/ACT inhaler 2 puff (has no administration in time range)  pantoprazole (PROTONIX) EC tablet 40 mg (has no administration in time range)  acetaminophen (TYLENOL) tablet 650 mg (has no administration in time range)    Or  acetaminophen (TYLENOL) suppository 650 mg (has no administration in time range)  oxyCODONE (Oxy IR/ROXICODONE) immediate release tablet 5 mg (has no administration in time range)  HYDROmorphone (DILAUDID) injection 0.5-1 mg (has no administration in time range)  polyethylene glycol (MIRALAX / GLYCOLAX) packet 17 g (has no administration in time range)  ondansetron (ZOFRAN) tablet 4 mg (has no administration in time range)    Or  ondansetron (ZOFRAN) injection 4 mg (has no administration in time range)  nicotine (NICODERM CQ - dosed in mg/24 hours) patch 14 mg (has no administration in time range)  LORazepam (ATIVAN) injection 0.5 mg (has no administration in time range)  sodium chloride flush (NS) 0.9 % injection 3 mL (3 mLs Intravenous Given 11/07/19 1037)  morphine 4 MG/ML injection 4 mg (4 mg Intravenous Given 11/07/19 1036)  ondansetron (ZOFRAN) injection 4 mg (4 mg Intravenous Given 11/07/19 1036)  lactated ringers bolus 500 mL (0 mLs Intravenous Stopped 11/07/19 1146)  HYDROmorphone (DILAUDID) injection 1 mg (1 mg Intravenous Given 11/07/19 1546)  LORazepam (ATIVAN) injection 1 mg (1 mg Intravenous Given 11/07/19 1716)  HYDROmorphone (DILAUDID) injection 0.5 mg (0.5 mg Intravenous Given 11/07/19 1715)    Mobility walks

## 2019-11-08 ENCOUNTER — Observation Stay (HOSPITAL_COMMUNITY): Payer: Medicaid Other

## 2019-11-08 DIAGNOSIS — K219 Gastro-esophageal reflux disease without esophagitis: Secondary | ICD-10-CM | POA: Diagnosis not present

## 2019-11-08 DIAGNOSIS — M546 Pain in thoracic spine: Secondary | ICD-10-CM | POA: Diagnosis not present

## 2019-11-08 DIAGNOSIS — F329 Major depressive disorder, single episode, unspecified: Secondary | ICD-10-CM | POA: Diagnosis not present

## 2019-11-08 DIAGNOSIS — J189 Pneumonia, unspecified organism: Secondary | ICD-10-CM | POA: Diagnosis not present

## 2019-11-08 DIAGNOSIS — F411 Generalized anxiety disorder: Secondary | ICD-10-CM | POA: Diagnosis not present

## 2019-11-08 DIAGNOSIS — R0602 Shortness of breath: Secondary | ICD-10-CM | POA: Diagnosis not present

## 2019-11-08 DIAGNOSIS — F17210 Nicotine dependence, cigarettes, uncomplicated: Secondary | ICD-10-CM | POA: Diagnosis present

## 2019-11-08 DIAGNOSIS — B029 Zoster without complications: Secondary | ICD-10-CM | POA: Diagnosis not present

## 2019-11-08 DIAGNOSIS — R109 Unspecified abdominal pain: Secondary | ICD-10-CM | POA: Diagnosis not present

## 2019-11-08 DIAGNOSIS — J45909 Unspecified asthma, uncomplicated: Secondary | ICD-10-CM | POA: Diagnosis not present

## 2019-11-08 DIAGNOSIS — I472 Ventricular tachycardia: Secondary | ICD-10-CM | POA: Diagnosis not present

## 2019-11-08 DIAGNOSIS — D500 Iron deficiency anemia secondary to blood loss (chronic): Secondary | ICD-10-CM | POA: Diagnosis not present

## 2019-11-08 DIAGNOSIS — Z880 Allergy status to penicillin: Secondary | ICD-10-CM | POA: Diagnosis not present

## 2019-11-08 DIAGNOSIS — Z888 Allergy status to other drugs, medicaments and biological substances status: Secondary | ICD-10-CM | POA: Diagnosis not present

## 2019-11-08 DIAGNOSIS — M25511 Pain in right shoulder: Secondary | ICD-10-CM | POA: Diagnosis not present

## 2019-11-08 DIAGNOSIS — Z20822 Contact with and (suspected) exposure to covid-19: Secondary | ICD-10-CM | POA: Diagnosis not present

## 2019-11-08 DIAGNOSIS — R1011 Right upper quadrant pain: Secondary | ICD-10-CM | POA: Diagnosis present

## 2019-11-08 DIAGNOSIS — K824 Cholesterolosis of gallbladder: Secondary | ICD-10-CM | POA: Diagnosis present

## 2019-11-08 DIAGNOSIS — Z79899 Other long term (current) drug therapy: Secondary | ICD-10-CM | POA: Diagnosis not present

## 2019-11-08 DIAGNOSIS — R112 Nausea with vomiting, unspecified: Secondary | ICD-10-CM | POA: Diagnosis not present

## 2019-11-08 DIAGNOSIS — I4510 Unspecified right bundle-branch block: Secondary | ICD-10-CM | POA: Diagnosis present

## 2019-11-08 DIAGNOSIS — M7591 Shoulder lesion, unspecified, right shoulder: Secondary | ICD-10-CM | POA: Diagnosis not present

## 2019-11-08 DIAGNOSIS — R079 Chest pain, unspecified: Secondary | ICD-10-CM | POA: Diagnosis not present

## 2019-11-08 DIAGNOSIS — G894 Chronic pain syndrome: Secondary | ICD-10-CM | POA: Diagnosis present

## 2019-11-08 DIAGNOSIS — E669 Obesity, unspecified: Secondary | ICD-10-CM | POA: Diagnosis present

## 2019-11-08 DIAGNOSIS — Z7952 Long term (current) use of systemic steroids: Secondary | ICD-10-CM | POA: Diagnosis not present

## 2019-11-08 DIAGNOSIS — K449 Diaphragmatic hernia without obstruction or gangrene: Secondary | ICD-10-CM | POA: Diagnosis not present

## 2019-11-08 DIAGNOSIS — Z91010 Allergy to peanuts: Secondary | ICD-10-CM | POA: Diagnosis not present

## 2019-11-08 DIAGNOSIS — R197 Diarrhea, unspecified: Secondary | ICD-10-CM | POA: Diagnosis not present

## 2019-11-08 DIAGNOSIS — R932 Abnormal findings on diagnostic imaging of liver and biliary tract: Secondary | ICD-10-CM | POA: Diagnosis not present

## 2019-11-08 DIAGNOSIS — Z79891 Long term (current) use of opiate analgesic: Secondary | ICD-10-CM | POA: Diagnosis not present

## 2019-11-08 DIAGNOSIS — N200 Calculus of kidney: Secondary | ICD-10-CM | POA: Diagnosis not present

## 2019-11-08 LAB — COMPREHENSIVE METABOLIC PANEL
ALT: 12 U/L (ref 0–44)
AST: 12 U/L — ABNORMAL LOW (ref 15–41)
Albumin: 3.5 g/dL (ref 3.5–5.0)
Alkaline Phosphatase: 48 U/L (ref 38–126)
Anion gap: 11 (ref 5–15)
BUN: 8 mg/dL (ref 6–20)
CO2: 22 mmol/L (ref 22–32)
Calcium: 8.5 mg/dL — ABNORMAL LOW (ref 8.9–10.3)
Chloride: 104 mmol/L (ref 98–111)
Creatinine, Ser: 0.61 mg/dL (ref 0.44–1.00)
GFR calc Af Amer: >60 mL/min (ref >60)
GFR calc non Af Amer: >60 mL/min (ref >60)
Glucose, Bld: 72 mg/dL (ref 70–99)
Potassium: 3.9 mmol/L (ref 3.5–5.1)
Sodium: 137 mmol/L (ref 135–145)
Total Bilirubin: 0.7 mg/dL (ref 0.3–1.2)
Total Protein: 6.1 g/dL — ABNORMAL LOW (ref 6.5–8.1)

## 2019-11-08 LAB — CBC
HCT: 35.1 % — ABNORMAL LOW (ref 36.0–46.0)
Hemoglobin: 10.1 g/dL — ABNORMAL LOW (ref 12.0–15.0)
MCH: 24.4 pg — ABNORMAL LOW (ref 26.0–34.0)
MCHC: 28.8 g/dL — ABNORMAL LOW (ref 30.0–36.0)
MCV: 84.8 fL (ref 80.0–100.0)
Platelets: 256 10*3/uL (ref 150–400)
RBC: 4.14 MIL/uL (ref 3.87–5.11)
RDW: 17 % — ABNORMAL HIGH (ref 11.5–15.5)
WBC: 7.1 10*3/uL (ref 4.0–10.5)
nRBC: 0 % (ref 0.0–0.2)

## 2019-11-08 LAB — HIV ANTIBODY (ROUTINE TESTING W REFLEX): HIV Screen 4th Generation wRfx: NONREACTIVE

## 2019-11-08 LAB — TSH: TSH: 3.615 u[IU]/mL (ref 0.350–4.500)

## 2019-11-08 LAB — HCG, QUANTITATIVE, PREGNANCY: hCG, Beta Chain, Quant, S: <1 m[IU]/mL (ref <5)

## 2019-11-08 MED ORDER — KETOROLAC TROMETHAMINE 15 MG/ML IJ SOLN
15.0000 mg | Freq: Four times a day (QID) | INTRAMUSCULAR | Status: AC | PRN
  Administered 2019-11-08 – 2019-11-12 (×10): 15 mg via INTRAVENOUS
  Filled 2019-11-08 (×11): qty 1

## 2019-11-08 MED ORDER — MELATONIN 3 MG PO TABS
6.0000 mg | ORAL_TABLET | Freq: Every evening | ORAL | Status: DC | PRN
  Administered 2019-11-08 – 2019-11-14 (×2): 6 mg via ORAL
  Filled 2019-11-08 (×3): qty 2

## 2019-11-08 MED ORDER — PANTOPRAZOLE SODIUM 40 MG IV SOLR
40.0000 mg | Freq: Two times a day (BID) | INTRAVENOUS | Status: DC
  Administered 2019-11-08 – 2019-11-14 (×14): 40 mg via INTRAVENOUS
  Filled 2019-11-08 (×13): qty 40

## 2019-11-08 MED ORDER — TECHNETIUM TC 99M MEBROFENIN IV KIT
5.4000 | PACK | Freq: Once | INTRAVENOUS | Status: AC | PRN
  Administered 2019-11-08: 5.4 via INTRAVENOUS

## 2019-11-08 MED ORDER — ALBUTEROL SULFATE (2.5 MG/3ML) 0.083% IN NEBU
2.5000 mg | INHALATION_SOLUTION | Freq: Two times a day (BID) | RESPIRATORY_TRACT | Status: DC
  Administered 2019-11-08 – 2019-11-12 (×9): 2.5 mg via RESPIRATORY_TRACT
  Filled 2019-11-08 (×9): qty 3

## 2019-11-08 MED ORDER — LACTATED RINGERS IV SOLN: INTRAVENOUS | Status: AC

## 2019-11-08 NOTE — Progress Notes (Signed)
Subjective: CC: RUQ abdominal pain  Patient notes severe pain in her RUQ that radiates to her right and left scapula. She reports nausea and emesis yesterday with cld. She is unsure if her pain was exacerbated by CLD but believes it may have been. Some reflux like symptoms. Passing flatus.   Objective: Vital signs in last 24 hours: Temp:  [97.8 F (36.6 C)-98.8 F (37.1 C)] 98.8 F (37.1 C) (07/28 0544) Pulse Rate:  [70-105] 105 (07/28 0544) Resp:  [9-18] 16 (07/28 0544) BP: (120-154)/(77-93) 154/86 (07/28 0544) SpO2:  [93 %-100 %] 98 % (07/28 0842) Last BM Date: 11/05/19  Intake/Output from previous day: 07/27 0701 - 07/28 0700 In: 620 [P.O.:120; IV Piggyback:500] Out: 0  Intake/Output this shift: No intake/output data recorded.  PE: Gen:  Alert, NAD, pleasant Pulm: normal rate and effort  Abd: Soft, ND, tenderness of the RUQ, +BS Ext:  No LE edema  Psych: A&Ox3 Skin: no rashes noted, warm and dry   Lab Results:  Recent Labs    11/07/19 1034 11/08/19 0523  WBC 10.7* 7.1  HGB 10.2* 10.1*  HCT 33.6* 35.1*  PLT 274 256   BMET Recent Labs    11/07/19 1034 11/08/19 0523  NA 141 137  K 3.5 3.9  CL 106 104  CO2 24 22  GLUCOSE 68* 72  BUN 7 8  CREATININE 0.59 0.61  CALCIUM 8.4* 8.5*   PT/INR No results for input(s): LABPROT, INR in the last 72 hours. CMP     Component Value Date/Time   NA 137 11/08/2019 0523   K 3.9 11/08/2019 0523   CL 104 11/08/2019 0523   CO2 22 11/08/2019 0523   GLUCOSE 72 11/08/2019 0523   BUN 8 11/08/2019 0523   CREATININE 0.61 11/08/2019 0523   CALCIUM 8.5 (L) 11/08/2019 0523   PROT 6.1 (L) 11/08/2019 0523   ALBUMIN 3.5 11/08/2019 0523   AST 12 (L) 11/08/2019 0523   ALT 12 11/08/2019 0523   ALKPHOS 48 11/08/2019 0523   BILITOT 0.7 11/08/2019 0523   GFRNONAA >60 11/08/2019 0523   GFRAA >60 11/08/2019 0523   Lipase     Component Value Date/Time   LIPASE 27 11/07/2019 1034       Studies/Results: DG Chest 1  View  Result Date: 11/07/2019 CLINICAL DATA:  Pain EXAM: CHEST  1 VIEW COMPARISON:  October 15, 2019 FINDINGS: Lungs are clear. The heart size and pulmonary vascularity are normal. No adenopathy. No pneumothorax. No bone lesions. IMPRESSION: Lungs clear.  Cardiac silhouette within normal limits. Electronically Signed   By: Bretta Bang III M.D.   On: 11/07/2019 10:10    Anti-infectives: Anti-infectives (From admission, onward)   None       Assessment/Plan Hx asthma Hx Migraine Chronic Anemia  BMI 37.41 - Per TRH -    Upper abdominal pain, nausea, vomiting, diarrhea x 4 weeks GERD - Unclear etiology of the patients symptoms - RUQ [place] with Gallbladder polyps ~2mm;  no gallstones; CBD 2 mm - CT with no gallstones/gallbladder wall thickening/biliary dilatation.  Pancreas unremarkable. There are tiny renal stones bilaterally without ureteral stone. Otherwise CT A/P wnl - WBC normalized. LFT/lipase wnl - GI panel ordered on admission  - Agree with PPI BID. Avoid NSAIDs until etiology more clear.  - GI consulted. They are obtaining a HIDA w/ EF. Will follow up on results. If negative they will consider EGD  FEN - NPO, IVF VTE - SCDs, okay for chemical  prophyalxis from a general surgery standpoint ID - None    LOS: 0 days    Jacinto Halim , [facility] Surgery 11/08/2019, 9:09 AM Please see Amion for pager number during day hours 7:00am-4:30pm

## 2019-11-08 NOTE — Progress Notes (Signed)
PROGRESS NOTE    Vicki Moore  WFU:[id number] DOB: [birth year] DOA: 11/07/2019 PCP: Patient, No Pcp Per   Chief Complaint  Patient presents with  . Abdominal Pain    Brief Narrative:  This is [name] very pleasant 39 year old female with Vicki Moore history of migraines, asthma, GERD and anxiety who presented to the ED with persistent RUQ abdominal pain with radiation to right shoulder and decreased p.o. intake.   She has been seen in the ED over the past month several times for similar issues most notably on 7/4 at which point her vitals were stable and without fever.  RUQ on 7/5 ultrasound revealed single 5 mm focus which could be stone versus polyp in the gallbladder, common bile duct was 3.1 mm.  She was discharged with Zofran and Bentyl.  She returned on 7/25 with reports of [name] large gallstone on an outpatient ultrasound with worsening RUQ pain and right shoulder pain.  Also reported nausea and vomiting at the time for which she was taking Mylanta without much relief.  Had an abdominal ultrasound on 7/25 with no gallbladder wall thickening and multiple small gallbladder wall polyps measuring approximately 3 mm with positive Murphy sign, CBD 2 mm without cholecystitis or cholelithiasis.  CT scan abdomen on 7/25 showed nonobstructing nephrolithiasis bilaterally and otherwise unremarkable.  She was discharged with plans for GI follow-up.  Unfortunately, she returned again today with persistent RUQ pain and radiation to right shoulder.  She has only been able to have chicken broth and is unable to keep anything else down.  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  ED Course: Vital signs unremarkable, WBC 12.5 on 7/25-> 10.7 today.  Hb 10.2, down from 11.32 days ago.  Glucose 68, beta hCG borderline elevated at 5.4 (n <5). ED MD discussed with  GI who recommended surgery consult who ordered GI panel and PPI.  Assessment & Plan:   Principal Problem:   RUQ pain Active Problems:   Anxiety state   Asthma   GERD (gastroesophageal reflux disease)  1. Intractable RUQ pain with radiation to right shoulder Brandalyn Harting. Unclear etiology at this time b. GI consulted, appreciate recs - planning HIDA scan, plan to defer EGD for now c. Surgery c/s, appreciate recommendations d. CT abdomen without acute abnormality - RUQ [place] with small gallbladder polyp e. GI path panel d/c'd per GI f. General surgery consulted, added GI panel g. Continue PPI BID  2. Anxiety [name]. TSH, wnl b. Ativan PRN c. Consider starting SSRI at discharge if she is tolerating PO  3. Weight Gain  Swelling: pt describes lower extremity swelling and facial swelling on/off over past several months.  She was on steroids as an outpatient, but no longer taking this.  UA negative for protein.  Follow BNP.  Consider w/u for cushings.   4. GERD [name]. Increase PPI BID  4. Asthma [name]. Continue home inhalers  DVT prophylaxis: SCD Code Status: full  Family Communication: none Disposition:   Status is: Observation  The patient will require care spanning > 2 midnights and should be moved to inpatient because: Inpatient level of care appropriate due to severity of illness  Dispo: The patient is from: Home              Anticipated d/c is to: Home  Anticipated d/c date is: 1 day              Patient currently is not medically stable to d/c.  Consultants:   GI  Surgery  Procedures:   none  Antimicrobials:  Anti-infectives (From admission, onward)   None     Subjective: C/o RUQ abdominal pain  Objective: Vitals:   11/08/19 0200 11/08/19 0544 11/08/19 0842 11/08/19 1334  BP: 122/84 (!) 154/86  (!) 133/64  Pulse:  105  92  Resp: 16 16  19   Temp: 98.2 F (36.8 C) 98.8 F (37.1 C)  99.5 F (37.5 C)  TempSrc: Oral Oral  Oral  SpO2: 100% 99% 98% 98%     Intake/Output Summary (Last 24 hours) at 11/08/2019 1418 Last data filed at 11/08/2019 0600 Gross per 24 hour  Intake 120 ml  Output 0 ml  Net 120 ml   There were no vitals filed for this visit.  Examination:  General exam: Appears calm and comfortable  Respiratory system: Clear to auscultation. Respiratory effort normal. Cardiovascular system: S1 & S2 heard, RRR.  Gastrointestinal system: RUQ tenderness, even to light touch Central nervous system: Alert and oriented. No focal neurological deficits. Extremities: no lee Skin: No rashes, lesions or ulcers Psychiatry: Judgement and insight appear normal. Mood & affect appropriate.     Data Reviewed: I have personally reviewed following labs and imaging studies  CBC: Recent Labs  Lab 11/05/19 2241 11/07/19 1034 11/08/19 0523  WBC 12.5* 10.7* 7.1  NEUTROABS 11.2*  --   --   HGB 11.3* 10.2* 10.1*  HCT 36.3 33.6* 35.1*  MCV 79.4* 80.4 84.8  PLT 312 274 256    Basic Metabolic Panel: Recent Labs  Lab 11/05/19 2241 11/07/19 1034 11/08/19 0523  NA 138 141 137  K 4.4 3.5 3.9  CL 104 106 104  CO2 23 24 22   GLUCOSE 116* 68* 72  BUN 14 7 8   CREATININE 0.74 0.59 0.61  CALCIUM 9.0 8.4* 8.5*    GFR: Estimated Creatinine Clearance: 97.2 mL/min (by C-G formula based on SCr of 0.61 mg/dL).  Liver Function Tests: Recent Labs  Lab 11/05/19 2241 11/07/19 1034 11/08/19 0523  AST 14* 14* 12*  ALT 13 12 12   ALKPHOS 58 48 48  BILITOT 0.4 0.2* 0.7  PROT 7.4 6.7 6.1*  ALBUMIN 4.1 3.7 3.5    CBG: No results for input(s): GLUCAP in the last 168 hours.   Recent Results (from the past 240 hour(s))  SARS Coronavirus 2 by RT PCR (hospital order, performed in [facility] hospital lab) Nasopharyngeal Nasopharyngeal Swab     Status: None   Collection Time: 11/07/19  3:06 PM   Specimen: Nasopharyngeal Swab  Result Value Ref Range Status   SARS Coronavirus 2 NEGATIVE NEGATIVE Final    Comment: (NOTE) SARS-CoV-2 target  nucleic acids are NOT DETECTED.  The SARS-CoV-2 RNA is generally detectable in upper and lower respiratory specimens during the acute phase of infection. The lowest concentration of SARS-CoV-2 viral copies this assay can detect is 250 copies / mL. Eria Lozoya negative result does not preclude SARS-CoV-2 infection and should not be used as the sole basis for treatment or other patient management decisions.  Leena Tiede negative result may occur with improper specimen collection / handling, submission of specimen other than nasopharyngeal swab, presence of viral mutation(s) within the areas targeted by this assay, and inadequate number of viral copies (<250 copies / mL). Wasim Hurlbut negative result must be combined with clinical observations, patient  history, and epidemiological information.  Fact Sheet for Patients:   BoilerBrush.com.cy  Fact Sheet for Healthcare Providers: https://pope.com/  This test is not yet approved or  cleared by the [place] FDA and has been authorized for detection and/or diagnosis of SARS-CoV-2 by FDA under an Emergency Use Authorization (EUA).  This EUA will remain in effect (meaning this test can be used) for the duration of the COVID-19 declaration under Section 564(b)(1) of the Act, 21 U.S.C. section 360bbb-3(b)(1), unless the authorization is terminated or revoked sooner.  Performed at [facility], [address], [place], [place] [address]   Surgical pcr screen     Status: Abnormal   Collection Time: 11/07/19  9:04 PM   Specimen: Nasal Mucosa; Nasal Swab  Result Value Ref Range Status   MRSA, PCR NEGATIVE NEGATIVE Final   Staphylococcus aureus POSITIVE (Mustapha Colson) NEGATIVE Final    Comment: (NOTE) The Xpert SA Assay (FDA approved for NASAL specimens in patients 23 years of age and older), is one component of Jahaan Vanwagner comprehensive surveillance program. It is not intended to diagnose infection nor to guide or monitor  treatment. Performed at [facility], [address], [place], [place] [address]          Radiology Studies: DG Chest 1 View  Result Date: 11/07/2019 CLINICAL DATA:  Pain EXAM: CHEST  1 VIEW COMPARISON:  October 15, 2019 FINDINGS: Lungs are clear. The heart size and pulmonary vascularity are normal. No adenopathy. No pneumothorax. No bone lesions. IMPRESSION: Lungs clear.  Cardiac silhouette within normal limits. Electronically Signed   By: Bretta Bang III M.D.   On: 11/07/2019 10:10        Scheduled Meds: . albuterol  2.5 mg Nebulization BID  . Chlorhexidine Gluconate Cloth  6 each Topical Daily  . fluticasone furoate-vilanterol  1 puff Inhalation Daily  . mupirocin ointment  1 application Nasal BID  . nicotine  14 mg Transdermal Daily  . pantoprazole (PROTONIX) IV  40 mg Intravenous Q12H   Continuous Infusions: . lactated ringers 100 mL/hr at 11/08/19 1042     LOS: 0 days    Time spent: over 30 min    Lacretia Nicks, MD [facility]   To contact the attending provider between 7A-7P or the covering provider during after hours 7P-7A, please log into the web site [email or web address] and access using universal [place] password for that web site. If you do not have the password, please call the hospital operator.  11/08/2019, 2:18 PM

## 2019-11-08 NOTE — Consult Note (Addendum)
Referring Provider: [facility] Primary Care Physician:  Patient, No Pcp Per Primary Gastroenterologist:  Unassigned  Reason for Consultation:  RUQ pain, intractable nausea and vomiting  HPI: Vicki Moore is a 39 y.o. female with past medical history of asthma presenting for consultation of RUQ abdominal pain and intractable nausea and vomiting.  Patient reports intermittent right upper quadrant abdominal pain for the last year, acutely worsening over the last several weeks.  Pain in the right upper quadrant is now constant and severe and radiates to her right shoulder blade.  It is associated with persistent nausea and vomiting.  Patient has been unable to tolerate any oral intake with the exception of vegetable broth.  Denies any hematemesis or coffee-ground emesis.    She has also had some heartburn over the last few weeks but denies belching.  Denies any dysphagia.    She has had some bloating/flatulence and intermittent diarrhea, which she attributes to the stress of losing her grandmother.  Denies any melena or hematochezia.  Patient denies any weight loss and states she has actually been gaining weight despite not being able to tolerate a regular diet.  She also notes intermittent swelling of her legs as well as her face.  She notes intermittent palpitations.  [facility] as needed and has been taking them daily recently due to RUQ pain but states this has not provided any relief.  Denies any family history of gallbladder disease, colon cancer or gastrointestinal malignancy.  Past Medical History:  Diagnosis Date  . Asthma     History reviewed. No pertinent surgical history.  Prior to Admission medications   Medication Sig Start Date End Date Taking? Authorizing Provider  ADVAIR DISKUS 500-50 MCG/DOSE AEPB Inhale 1 puff into the lungs 2 (two) times daily. 10/11/19  Yes [provider]  albuterol (PROVENTIL HFA;VENTOLIN HFA) 108 (90 BASE) MCG/ACT  inhaler Inhale 2 puffs into the lungs every 6 (six) hours as needed. For shortness of breath    Yes [provider]  alprazolam Prudy Feeler(XANAX) 2 MG tablet Take 2 mg by mouth 3 (three) times daily as needed for anxiety. 10/16/19  Yes [provider]  alum & mag hydroxide-simeth (MAALOX/MYLANTA) 200-200-20 MG/5ML suspension Take 15 mLs by mouth every 6 (six) hours as needed for indigestion or heartburn.   Yes [provider]  dicyclomine (BENTYL) 20 MG tablet Take 1 tablet (20 mg total) by mouth 2 (two) times daily. Patient taking differently: Take 20 mg by mouth 2 (two) times daily as needed for spasms.  10/15/19  Yes Renne CriglerGeiple, Joshua, PA-C  ibuprofen (ADVIL) 200 MG tablet Take 400 mg by mouth every 6 (six) hours as needed for headache or mild pain.   Yes [provider]  ipratropium-albuterol (DUONEB) 0.5-2.5 (3) MG/3ML SOLN Take 3 mLs by nebulization 4 (four) times daily as needed for wheezing or shortness of breath. 10/11/19  Yes [provider]  pantoprazole (PROTONIX) 40 MG tablet Take 1 tablet (40 mg total) by mouth daily. 11/06/19  Yes Cathren LaineSteinl, Kevin, MD  traMADol (ULTRAM) 50 MG tablet Take 1 tablet (50 mg total) by mouth every 6 (six) hours as needed. 11/06/19  Yes Cathren LaineSteinl, Kevin, MD  ondansetron (ZOFRAN ODT) 4 MG disintegrating tablet Take 1 tablet (4 mg total) by mouth every 8 (eight) hours as needed for nausea or vomiting. Patient not taking: Reported on 11/07/2019 10/15/19   Renne CriglerGeiple, Joshua, PA-C  predniSONE (DELTASONE) 20 MG tablet Take 2 tablets (40 mg total) by mouth  daily. Patient not taking: Reported on 11/07/2019 03/18/11   Raeford Razor, MD    Scheduled Meds: . Chlorhexidine Gluconate Cloth  6 each Topical Daily  . fluticasone furoate-vilanterol  1 puff Inhalation Daily  . mupirocin ointment  1 application Nasal BID  . nicotine  14 mg Transdermal Daily  . pantoprazole (PROTONIX) IV  40 mg Intravenous Q12H   Continuous Infusions: . lactated ringers      PRN Meds:.acetaminophen **OR** acetaminophen, albuterol, dicyclomine, HYDROmorphone (DILAUDID) injection, LORazepam, melatonin, ondansetron **OR** ondansetron (ZOFRAN) IV, oxyCODONE, polyethylene glycol  Allergies as of 11/07/2019 - Review Complete 11/07/2019  Allergen Reaction Noted  . Other Shortness Of Breath and Hives 03/18/2011  . Peanut-containing drug products Hives and Shortness Of Breath 03/18/2011  . Penicillins Hives, Nausea And Vomiting, and Anaphylaxis 09/29/2014    History reviewed. No pertinent family history.  Social History   Socioeconomic History  . Marital status: Single    Spouse name: Not on file  . Number of children: Not on file  . Years of education: Not on file  . Highest education level: Not on file  Occupational History  . Not on file  Tobacco Use  . Smoking status: Current Some Day Smoker  . Smokeless tobacco: Never Used  Vaping Use  . Vaping Use: Never used  Substance and Sexual Activity  . Alcohol use: Yes  . Drug use: Never  . Sexual activity: Not on file  Other Topics Concern  . Not on file  Social History Narrative  . Not on file   Social Determinants of Health   Financial Resource Strain:   . Difficulty of Paying Living Expenses:   Food Insecurity:   . Worried About Programme researcher, broadcasting/film/video in the Last Year:   . Barista in the Last Year:   Transportation Needs:   . Freight forwarder (Medical):   Marland Kitchen Lack of Transportation (Non-Medical):   Physical Activity:   . Days of Exercise per Week:   . Minutes of Exercise per Session:   Stress:   . Feeling of Stress :   Social Connections:   . Frequency of Communication with Friends and Family:   . Frequency of Social Gatherings with Friends and Family:   . Attends Religious Services:   . Active Member of Clubs or Organizations:   . Attends Banker Meetings:   Marland Kitchen Marital Status:   Intimate Partner Violence:   . Fear of Current or Ex-Partner:   . Emotionally  Abused:   Marland Kitchen Physically Abused:   . Sexually Abused:     Review of Systems: Review of Systems  Constitutional: Negative for chills, fever and weight loss.  HENT: Negative for hearing loss and tinnitus.   Eyes: Negative for pain and redness.  Respiratory: Negative for cough and shortness of breath.   Cardiovascular: Positive for palpitations. Negative for chest pain.  Gastrointestinal: Positive for abdominal pain (RUQ), diarrhea (intermittent), heartburn, nausea and vomiting. Negative for blood in stool, constipation and melena.  Genitourinary: Negative for flank pain and hematuria.  Musculoskeletal: Negative for falls and myalgias.  Skin: Negative for itching and rash.  Neurological: Negative for seizures and loss of consciousness.  Endo/Heme/Allergies: Negative for polydipsia. Does not bruise/bleed easily.  Psychiatric/Behavioral: Negative for substance abuse. The patient is not nervous/anxious.     Physical Exam: Physical Exam Constitutional:      General: She is not in acute distress.    Appearance: Normal appearance.  HENT:  Head: Normocephalic and atraumatic.     Nose: Nose normal.     Mouth/Throat:     Mouth: Mucous membranes are moist.     Pharynx: Oropharynx is clear.  Eyes:     General: No scleral icterus.    Extraocular Movements: Extraocular movements intact.  Cardiovascular:     Rate and Rhythm: Normal rate and regular rhythm.  Abdominal:     General: Bowel sounds are normal. There is no distension.     Palpations: Abdomen is soft. There is no mass.     Tenderness: There is abdominal tenderness (moderate RUQ, mild epigastric) in the right upper quadrant and epigastric area. There is guarding (RUQ). There is no rebound. Positive signs include Murphy's sign.  Musculoskeletal:        General: No deformity.     Cervical back: Normal range of motion and neck supple.     Right lower leg: No edema.     Left lower leg: No edema.  Skin:    General: Skin is warm and  dry.  Neurological:     General: No focal deficit present.     Mental Status: She is alert and oriented to person, place, and time.  Psychiatric:        Mood and Affect: Mood normal.        Behavior: Behavior normal.     Vital signs: Vitals:   11/08/19 0544 11/08/19 0842  BP: (!) 154/86   Pulse: 105   Resp: 16   Temp: 98.8 F (37.1 C)   SpO2: 99% 98%   Last BM Date: 11/05/19  GI:  Lab Results: Recent Labs    11/05/19 2241 11/07/19 1034 11/08/19 0523  WBC 12.5* 10.7* 7.1  HGB 11.3* 10.2* 10.1*  HCT 36.3 33.6* 35.1*  PLT 312 274 256   BMET Recent Labs    11/05/19 2241 11/07/19 1034 11/08/19 0523  NA 138 141 137  K 4.4 3.5 3.9  CL 104 106 104  CO2 23 24 22   GLUCOSE 116* 68* 72  BUN 14 7 8   CREATININE 0.74 0.59 0.61  CALCIUM 9.0 8.4* 8.5*   LFT Recent Labs    11/08/19 0523  PROT 6.1*  ALBUMIN 3.5  AST 12*  ALT 12  ALKPHOS 48  BILITOT 0.7   PT/INR No results for input(s): LABPROT, INR in the last 72 hours.   Studies/Results: DG Chest 1 View  Result Date: 11/07/2019 CLINICAL DATA:  Pain EXAM: CHEST  1 VIEW COMPARISON:  October 15, 2019 FINDINGS: Lungs are clear. The heart size and pulmonary vascularity are normal. No adenopathy. No pneumothorax. No bone lesions. IMPRESSION: Lungs clear.  Cardiac silhouette within normal limits. Electronically Signed   By: 11/09/2019 III M.D.   On: 11/07/2019 10:10    Impression: Right upper quadrant abdominal pain and intractable nausea/vomiting.  Most consistent with biliary colic based on history and exam (RUQ tenderness with guarding, + Murphy's sign). -WBCs elevated to 12.6 on arrival, now within normal limits (7.1) -LFTs remain normal: T. Bili 0.7/AST12/ALT12/ALP 48 -Lipase normal as of 11/05/19  Chronic anemia: Hgb 10.1, stable as compared to baseline (10.5 on 10/15/19)  Intermittent diarrhea associated with stress; last BM 7/25.  Not consistent with infectious etiology; GI pathogen panel  discontinued.  Plan: HIDA scan with EF today.  NPO + narcotic avoidance until complete.  Supportive care with anti-emetics PRN.  Protonix 40 mg BID changed to IV dosing due to inability to tolerate PO intake.   Patient  has heartburn but no signs of UGI bleeding, and we are more suspicious of biliary etiology than gastric etiology of symptoms. Thus, we will defer EGD at this time.  [facility] will follow.   LOS: 0 days   Edrick Kins  PA-C 11/08/2019, 9:18 AM  Contact #  [phone]

## 2019-11-09 ENCOUNTER — Encounter (HOSPITAL_COMMUNITY): Payer: Self-pay | Admitting: Family Medicine

## 2019-11-09 ENCOUNTER — Inpatient Hospital Stay (HOSPITAL_COMMUNITY): Payer: Medicaid Other

## 2019-11-09 LAB — COMPREHENSIVE METABOLIC PANEL
ALT: 11 U/L (ref 0–44)
AST: 15 U/L (ref 15–41)
Albumin: 3.4 g/dL — ABNORMAL LOW (ref 3.5–5.0)
Alkaline Phosphatase: 48 U/L (ref 38–126)
Anion gap: 9 (ref 5–15)
BUN: 7 mg/dL (ref 6–20)
CO2: 24 mmol/L (ref 22–32)
Calcium: 8.9 mg/dL (ref 8.9–10.3)
Chloride: 105 mmol/L (ref 98–111)
Creatinine, Ser: 0.6 mg/dL (ref 0.44–1.00)
GFR calc Af Amer: >60 mL/min (ref >60)
GFR calc non Af Amer: >60 mL/min (ref >60)
Glucose, Bld: 75 mg/dL (ref 70–99)
Potassium: 4.5 mmol/L (ref 3.5–5.1)
Sodium: 138 mmol/L (ref 135–145)
Total Bilirubin: 0.9 mg/dL (ref 0.3–1.2)
Total Protein: 6 g/dL — ABNORMAL LOW (ref 6.5–8.1)

## 2019-11-09 LAB — CBC WITH DIFFERENTIAL/PLATELET
Abs Immature Granulocytes: 0.12 10*3/uL — ABNORMAL HIGH (ref 0.00–0.07)
Basophils Absolute: 0 10*3/uL (ref 0.0–0.1)
Basophils Relative: 0 %
Eosinophils Absolute: 0.3 10*3/uL (ref 0.0–0.5)
Eosinophils Relative: 4 %
HCT: 31.8 % — ABNORMAL LOW (ref 36.0–46.0)
Hemoglobin: 9.9 g/dL — ABNORMAL LOW (ref 12.0–15.0)
Immature Granulocytes: 2 %
Lymphocytes Relative: 26 %
Lymphs Abs: 1.8 10*3/uL (ref 0.7–4.0)
MCH: 24 pg — ABNORMAL LOW (ref 26.0–34.0)
MCHC: 31.1 g/dL (ref 30.0–36.0)
MCV: 77 fL — ABNORMAL LOW (ref 80.0–100.0)
Monocytes Absolute: 0.6 10*3/uL (ref 0.1–1.0)
Monocytes Relative: 8 %
Neutro Abs: 4.1 10*3/uL (ref 1.7–7.7)
Neutrophils Relative %: 60 %
Platelets: 236 10*3/uL (ref 150–400)
RBC: 4.13 MIL/uL (ref 3.87–5.11)
RDW: 16.6 % — ABNORMAL HIGH (ref 11.5–15.5)
WBC: 6.9 10*3/uL (ref 4.0–10.5)
nRBC: 0 % (ref 0.0–0.2)

## 2019-11-09 LAB — FERRITIN: Ferritin: 16 ng/mL (ref 11–307)

## 2019-11-09 LAB — MAGNESIUM: Magnesium: 1.8 mg/dL (ref 1.7–2.4)

## 2019-11-09 LAB — FOLATE: Folate: 11.8 ng/mL (ref >5.9)

## 2019-11-09 LAB — IRON AND TIBC
Iron: 54 ug/dL (ref 28–170)
Saturation Ratios: 14 % (ref 10.4–31.8)
TIBC: 381 ug/dL (ref 250–450)
UIBC: 327 ug/dL

## 2019-11-09 LAB — C-REACTIVE PROTEIN: CRP: 0.6 mg/dL (ref <1.0)

## 2019-11-09 LAB — BRAIN NATRIURETIC PEPTIDE: B Natriuretic Peptide: 29.7 pg/mL (ref 0.0–100.0)

## 2019-11-09 LAB — VITAMIN B12: Vitamin B-12: 279 pg/mL (ref 180–914)

## 2019-11-09 LAB — PHOSPHORUS: Phosphorus: 3.2 mg/dL (ref 2.5–4.6)

## 2019-11-09 MED ORDER — ACETAMINOPHEN 500 MG PO TABS
1000.0000 mg | ORAL_TABLET | Freq: Three times a day (TID) | ORAL | Status: AC
  Administered 2019-11-09 – 2019-11-12 (×9): 1000 mg via ORAL
  Filled 2019-11-09 (×9): qty 2

## 2019-11-09 MED ORDER — LEVOFLOXACIN IN D5W 750 MG/150ML IV SOLN
750.0000 mg | Freq: Every day | INTRAVENOUS | Status: DC
  Administered 2019-11-09 – 2019-11-12 (×4): 750 mg via INTRAVENOUS
  Filled 2019-11-09 (×6): qty 150

## 2019-11-09 MED ORDER — DEXAMETHASONE 0.5 MG PO TABS
1.0000 mg | ORAL_TABLET | Freq: Once | ORAL | Status: AC
  Administered 2019-11-09: 1 mg via ORAL
  Filled 2019-11-09 (×2): qty 2

## 2019-11-09 MED ORDER — GABAPENTIN 300 MG PO CAPS
300.0000 mg | ORAL_CAPSULE | Freq: Three times a day (TID) | ORAL | Status: DC
  Administered 2019-11-09 – 2019-11-14 (×14): 300 mg via ORAL
  Filled 2019-11-09 (×16): qty 1

## 2019-11-09 MED ORDER — IOHEXOL 350 MG/ML SOLN
100.0000 mL | Freq: Once | INTRAVENOUS | Status: AC | PRN
  Administered 2019-11-09: 100 mL via INTRAVENOUS

## 2019-11-09 MED ORDER — VALACYCLOVIR HCL 500 MG PO TABS
1000.0000 mg | ORAL_TABLET | Freq: Three times a day (TID) | ORAL | Status: DC
  Administered 2019-11-09 – 2019-11-14 (×13): 1000 mg via ORAL
  Filled 2019-11-09 (×20): qty 2

## 2019-11-09 MED ORDER — SODIUM CHLORIDE (PF) 0.9 % IJ SOLN
INTRAMUSCULAR | Status: AC
  Filled 2019-11-09: qty 50

## 2019-11-09 MED ORDER — PREDNISONE 20 MG PO TABS: 60.0000 mg | ORAL_TABLET | Freq: Every day | ORAL | Status: DC

## 2019-11-09 MED ORDER — GABAPENTIN 600 MG PO TABS: 300.0000 mg | ORAL_TABLET | Freq: Three times a day (TID) | ORAL | Status: DC

## 2019-11-09 MED ORDER — HYDROMORPHONE HCL 1 MG/ML IJ SOLN
0.5000 mg | Freq: Four times a day (QID) | INTRAMUSCULAR | Status: DC | PRN
  Administered 2019-11-09 – 2019-11-10 (×3): 1 mg via INTRAVENOUS
  Filled 2019-11-09 (×3): qty 1

## 2019-11-09 MED ORDER — DICLOFENAC SODIUM 1 % EX GEL
2.0000 g | Freq: Four times a day (QID) | CUTANEOUS | Status: DC
  Administered 2019-11-09 – 2019-11-14 (×19): 2 g via TOPICAL
  Filled 2019-11-09: qty 100

## 2019-11-09 NOTE — Progress Notes (Signed)
PROGRESS NOTE    FREDERICK KLINGER  VEH:[id number] DOB: 1980/12/27 DOA: 11/07/2019 PCP: Patient, No Pcp Per   Chief Complaint  Patient presents with  . Abdominal Pain    Brief Narrative:  This is Zakry Caso very pleasant 39 year old female with Charnika Herbst history of migraines, asthma, GERD and anxiety who presented to the ED with persistent RUQ abdominal pain with radiation to right shoulder and decreased p.o. intake.   She has been seen in the ED over the past month several times for similar issues most notably on 7/4 at which point her vitals were stable and without fever.  RUQ on 7/5 ultrasound revealed single 5 mm focus which could be stone versus polyp in the gallbladder, common bile duct was 3.1 mm.  She was discharged with Zofran and Bentyl.  She returned on 7/25 with reports of Aryav Wimberly large gallstone on an outpatient ultrasound with worsening RUQ pain and right shoulder pain.  Also reported nausea and vomiting at the time for which she was taking Mylanta without much relief.  Had an abdominal ultrasound on 7/25 with no gallbladder wall thickening and multiple small gallbladder wall polyps measuring approximately 3 mm with positive Murphy sign, CBD 2 mm without cholecystitis or cholelithiasis.  CT scan abdomen on 7/25 showed nonobstructing nephrolithiasis bilaterally and otherwise unremarkable.  She was discharged with plans for GI follow-up.  Unfortunately, she returned again today with persistent RUQ pain and radiation to right shoulder.  She has only been able to have chicken broth and is unable to keep anything else down.  Of note, she used to be [employer], commercial but injured herself two years ago and has since been more sedentary and has gained 60+ pounds this year which is causing her significant stress on top of her current issues.  ED Course: Vital signs unremarkable, WBC 12.5 on 7/25-> 10.7 today.  Hb 10.2, down from 11.32 days ago.  Glucose 68, beta hCG borderline elevated at 5.4 (n <5). ED MD discussed with  GI who recommended surgery consult who ordered GI panel and PPI.  Assessment & Plan:   Principal Problem:   RUQ pain Active Problems:   Anxiety state   Asthma   GERD (gastroesophageal reflux disease)   Right upper quadrant pain  1. Intractable RUQ pain with radiation to right shoulder  Concern for Shingles Lamiyah Schlotter. Unclear etiology at this time - rash with mildly erythematous papules/vesicular appearance on abdomen, appears possibly consistent with shingles seen today on abdomen (story doesn't fit perfectly with shingles as she's been in and out of ED for about 1 month at this time and rash isn't particularly impressive, would think she'd have rash sooner, but will treat for this) b. Start valcyclovir, gabapentin, steroids c. HIDA negative, upper GI with small sliding hiatal hernia, chest CT with areas of interstitial thickening and minimal nodularity, infectious vs inflammatory  d. CT abdomen without acute abnormality - RUQ [place] with small gallbladder polyp e. GI c/s, appreciate recs f. Surgery c/s, appreciate recs g. GI path panel d/c'd per GI h. Continue PPI BID  2. Anxiety  Chronic Pain Syndrome  History of Polysubstance Abuse Clovis Mankins. TSH, wnl b. Ativan PRN c. Hx of polysubstance abuse per care everywhere (will need to discuss additionally with pt), but recently she's been seeing [name] for chronic pain and getting buprenorphine, percocet, methylphenidate, and lyrica from him.  Apparently Dr. Tollie Eth is no longer going to be practicing and she'll have to find someone else for chronic pain.  d.  Limit IV pain meds, prioritize oral and non opiate pain meds e. Consider starting SSRI at discharge if she is tolerating PO  3. Community Acquired Pneumonia:  1. CT with interstitial thickening and nodularity in upper lobes, will treat with abx and will need follow up imaging outpatient  4. Hair Loss  Joint Pain:  Follow ANA, anti CCP, sed rate, CRP.   5. Weight Gain  Swelling: pt  describes lower extremity swelling and facial swelling on/off over past several months.  She was on steroids as an outpatient, but no longer taking this.  UA negative for protein.  Follow BNP.    1. Will do dexamethasone suppression test tonight  2. Follow up additionally as outpatient   6. GERD Ilani Otterson. Increase PPI BID  4. Asthma Domanique Huesman. Continue home inhalers  DVT prophylaxis: SCD Code Status: full  Family Communication: none Disposition:   Status is: inpatient  The patient will require care spanning > 2 midnights and should be moved to inpatient because: Inpatient level of care appropriate due to severity of illness  Dispo: The patient is from: Home              Anticipated d/c is to: Home              Anticipated d/c date is: 1 day              Patient currently is not medically stable to d/c.  Consultants:   GI  Surgery  Procedures:   none  Antimicrobials:  Anti-infectives (From admission, onward)   Start     Dose/Rate Route Frequency Ordered Stop   11/09/19 1615  valACYclovir (VALTREX) tablet 1,000 mg     Discontinue     1,000 mg Oral 3 times daily 11/09/19 1602     11/09/19 1600  levofloxacin (LEVAQUIN) IVPB 750 mg     Discontinue     750 mg 100 mL/hr over 90 Minutes Intravenous Daily 11/09/19 1509 11/14/19 1559     Subjective: Continued R sided pain  Objective: Vitals:   11/08/19 2136 11/09/19 0524 11/09/19 1109 11/09/19 1253  BP: 119/67 125/82  (!) 137/88  Pulse: 80 88  95  Resp: 16 15  17   Temp: 98.6 F (37 C) 98.4 F (36.9 C)  98.2 F (36.8 C)  TempSrc: Oral Oral  Oral  SpO2: 97% 96% 97% 97%    Intake/Output Summary (Last 24 hours) at 11/09/2019 1706 Last data filed at 11/09/2019 1253 Gross per 24 hour  Intake 1049.68 ml  Output 1600 ml  Net -550.32 ml   There were no vitals filed for this visit.  Examination:  General: No acute distress. Cardiovascular: Heart sounds show Berit Raczkowski regular rate, and rhythm.  Lungs: Clear to auscultation  bilaterally Abdomen: Soft, nontender, nondistended  Exquisitely TTP along R costal border Neurological: Alert and oriented 3. Moves all extremities 4 with equal strength. Cranial nerves II through XII grossly intact. Skin: mild erythematous rash with papules/vesicles  Extremities: No clubbing or cyanosis. No edema.      Data Reviewed: I have personally reviewed following labs and imaging studies  CBC: Recent Labs  Lab 11/05/19 2241 11/07/19 1034 11/08/19 0523 11/09/19 0517  WBC 12.5* 10.7* 7.1 6.9  NEUTROABS 11.2*  --   --  4.1  HGB 11.3* 10.2* 10.1* 9.9*  HCT 36.3 33.6* 35.1* 31.8*  MCV 79.4* 80.4 84.8 77.0*  PLT 312 274 256 236    Basic Metabolic Panel: Recent Labs  Lab 11/05/19 2241 11/07/19  1034 11/08/19 0523 11/09/19 0517  NA 138 141 137 138  K 4.4 3.5 3.9 4.5  CL 104 106 104 105  CO2 23 24 22 24   GLUCOSE 116* 68* 72 75  BUN 14 7 8 7   CREATININE 0.74 0.59 0.61 0.60  CALCIUM 9.0 8.4* 8.5* 8.9  MG  --   --   --  1.8  PHOS  --   --   --  3.2    GFR: Estimated Creatinine Clearance: 97.2 mL/min (by C-G formula based on SCr of 0.6 mg/dL).  Liver Function Tests: Recent Labs  Lab 11/05/19 2241 11/07/19 1034 11/08/19 0523 11/09/19 0517  AST 14* 14* 12* 15  ALT 13 12 12 11   ALKPHOS 58 48 48 48  BILITOT 0.4 0.2* 0.7 0.9  PROT 7.4 6.7 6.1* 6.0*  ALBUMIN 4.1 3.7 3.5 3.4*    CBG: No results for input(s): GLUCAP in the last 168 hours.   Recent Results (from the past 240 hour(s))  SARS Coronavirus 2 by RT PCR (hospital order, performed in [facility] hospital lab) Nasopharyngeal Nasopharyngeal Swab     Status: None   Collection Time: 11/07/19  3:06 PM   Specimen: Nasopharyngeal Swab  Result Value Ref Range Status   SARS Coronavirus 2 NEGATIVE NEGATIVE Final    Comment: (NOTE) SARS-CoV-2 target nucleic acids are NOT DETECTED.  The SARS-CoV-2 RNA is generally detectable in upper and lower respiratory specimens during the acute phase of infection. The  lowest concentration of SARS-CoV-2 viral copies this assay can detect is 250 copies / mL. Khya Halls negative result does not preclude SARS-CoV-2 infection and should not be used as the sole basis for treatment or other patient management decisions.  Littleton Haub negative result may occur with improper specimen collection / handling, submission of specimen other than nasopharyngeal swab, presence of viral mutation(s) within the areas targeted by this assay, and inadequate number of viral copies (<250 copies / mL). Sabrea Sankey negative result must be combined with clinical observations, patient history, and epidemiological information.  Fact Sheet for Patients:    Fact Sheet for Healthcare Providers: [facility]  This test is not yet approved or  cleared by the 11/09/19 FDA and has been authorized for detection and/or diagnosis of SARS-CoV-2 by FDA under an Emergency Use Authorization (EUA).  This EUA will remain in effect (meaning this test can be used) for the duration of the COVID-19 declaration under Section 564(b)(1) of the Act, 21 U.S.C. section 360bbb-3(b)(1), unless the authorization is terminated or revoked sooner.  Performed at [facility], [address], [place], [place]   Surgical pcr screen     Status: Abnormal   Collection Time: 11/07/19  9:04 PM   Specimen: Nasal Mucosa; Nasal Swab  Result Value Ref Range Status   MRSA, PCR NEGATIVE NEGATIVE Final   Staphylococcus aureus POSITIVE (Mashelle Busick) NEGATIVE Final    Comment: (NOTE) The Xpert SA Assay (FDA approved for NASAL specimens in patients 68 years of age and older), is one component of Rodell Marrs comprehensive surveillance program. It is not intended to diagnose infection nor to guide or monitor treatment. Performed at [facility], [address], [place], [place]          Radiology Studies: CT ANGIO CHEST PE W OR  WO CONTRAST  Result Date: 11/09/2019 CLINICAL DATA:  Chest pain, shortness of breath EXAM: CT ANGIOGRAPHY CHEST WITH CONTRAST TECHNIQUE: Multidetector CT imaging of the chest was performed using the standard  protocol during bolus administration of intravenous contrast. Multiplanar CT image reconstructions and MIPs were obtained to evaluate the vascular anatomy. CONTRAST:  OMNIPAQUE IOHEXOL 350 MG/ML SOLN COMPARISON:  2019 FINDINGS: Cardiovascular: Satisfactory opacification of the pulmonary arteries to the segmental level. No evidence of pulmonary embolism. Normal heart size. No pericardial effusion. Mediastinum/Nodes: There are no enlarged lymph nodes identified. Visualized thyroid is unremarkable. Esophagus is unremarkable. Lungs/Pleura: Mild interstitial thickening with minimal nodularity in the upper lobes is new from 2018. Additional patchy atelectasis. No pleural effusion or pneumothorax. Upper Abdomen: No acute abnormality. Musculoskeletal: No acute osseous abnormality. Review of the MIP images confirms the above findings. IMPRESSION: No evidence of acute pulmonary embolism. New small areas of interstitial thickening and minimal nodularity in the upper lobes, which may be infectious/inflammatory. Additional patchy atelectasis. Electronically Signed   By: Guadlupe Spanish M.D.   On: 11/09/2019 10:52   NM Hepato W/EF  Result Date: 11/08/2019 CLINICAL DATA:  Right upper quadrant abdominal pain. CT and ultrasound unremarkable. EXAM: NUCLEAR MEDICINE HEPATOBILIARY IMAGING WITH GALLBLADDER EF TECHNIQUE: Sequential images of the abdomen were obtained out to 60 minutes following intravenous administration of radiopharmaceutical. After oral ingestion of Ensure, gallbladder ejection fraction was determined. At 60 min, normal ejection fraction is greater than 33%. RADIOPHARMACEUTICALS:  5.4 mCi Tc-52m  Choletec IV COMPARISON:  CT 11/06/2019.  Ultrasound 11/05/2019. FINDINGS: Prompt uptake and biliary excretion  of activity by the liver is seen. Gallbladder activity is visualized, consistent with patency of cystic duct. Biliary activity passes into small bowel, consistent with patent common bile duct. Calculated gallbladder ejection fraction is 85%. (Normal gallbladder ejection fraction with Ensure is greater than 33%.) IMPRESSION: Normal nuclear medicine hepatobiliary scan and gallbladder ejection fraction. Electronically Signed   By: Paulina Fusi M.D.   On: 11/08/2019 17:00   DG UGI W SINGLE CM (SOL OR THIN BA)  Result Date: 11/09/2019 CLINICAL DATA:  Right upper quadrant abdominal pain. Additional history provided by patient: Patient reports dysphagia, right upper quadrant pain, right lower rib pain, intractable nausea/vomiting. EXAM: UPPER GI SERIES WITH KUB TECHNIQUE: After obtaining Jahmere Bramel scout radiograph Jye Fariss routine upper GI series was performed using thin and high density barium. FLUOROSCOPY TIME:  Fluoroscopy Time:  2 minutes, 24 seconds. Radiation Exposure Index (if provided by the fluoroscopic device): 83.6 mGy Number of Acquired Spot Images: 8 COMPARISON:  CT angiogram chest 11/09/2019, CT abdomen/pelvis 11/06/2019 FINDINGS: Guilherme Schwenke scout radiograph of the abdomen demonstrates residual contrast within the renal collecting systems and bladder. There is Branae Crail nonobstructive bowel gas pattern. No acute bony abnormality identified. Fluoroscopic evaluation demonstrates normal caliber and smooth contour of the esophagus. No evidence of fixed stricture, mass or mucosal abnormality. Normal esophageal motility was observed. No gastroesophageal reflux was observed. The patient swallowed Altheria Shadoan 13 mm barium tablet, which freely passed into the stomach. Small sliding hiatal hernia. Otherwise normal appearance of the stomach, duodenal bulb and duodenal sweep. No evidence of ulceration, fold thickening or mass. IMPRESSION: Small sliding hiatal hernia. Otherwise unremarkable upper GI series as detailed. Electronically Signed   By: Jackey Loge  DO   On: 11/09/2019 12:58        Scheduled Meds: . acetaminophen  1,000 mg Oral Q8H  . albuterol  2.5 mg Nebulization BID  . Chlorhexidine Gluconate Cloth  6 each Topical Daily  . diclofenac Sodium  2 g Topical QID  . fluticasone furoate-vilanterol  1 puff Inhalation Daily  . gabapentin  300 mg Oral TID  . mupirocin ointment  1  application Nasal BID  . nicotine  14 mg Transdermal Daily  . pantoprazole (PROTONIX) IV  40 mg Intravenous Q12H  . sodium chloride (PF)      . valACYclovir  1,000 mg Oral TID   Continuous Infusions: . levofloxacin (LEVAQUIN) IV       LOS: 1 day    Time spent: over 30 min    Lacretia Nicks, MD [facility]   To contact the attending provider between 7A-7P or the covering provider during after hours 7P-7A, please log into the web site [email or web address] and access using universal [place] password for that web site. If you do not have the password, please call the hospital operator.  11/09/2019, 5:06 PM

## 2019-11-09 NOTE — Progress Notes (Signed)
Pharmacy Antibiotic Note  Vicki Moore is a 39 y.o. female presented to the ED on 11/07/2019 with c/o abdominal pain.  HIDA scan on 7/28 was negative. Pharmacy is consulted on 7/29 to start levaquin for PNA.  Plan: - Levaquin 750 mg IV q24h (ordered already entered by MD for 5 days) - with stable renal function, pharmacy will sign off for abx. Re-consult [place] if need further assistance  __________________________________  Temp (24hrs), Avg:98.4 F (36.9 C), Min:98.2 F (36.8 C), Max:98.6 F (37 C)  Recent Labs  Lab 11/05/19 2241 11/07/19 1034 11/08/19 0523 11/09/19 0517  WBC 12.5* 10.7* 7.1 6.9  CREATININE 0.74 0.59 0.61 0.60    Estimated Creatinine Clearance: 97.2 mL/min (by C-G formula based on SCr of 0.6 mg/dL).    Allergies  Allergen Reactions  . Other Shortness Of Breath and Hives  . Peanut-Containing Drug Products Hives and Shortness Of Breath  . Penicillins Hives, Nausea And Vomiting and Anaphylaxis    Other reaction(s): RASH Other reaction(s): RASH      Thank you for allowing pharmacy to be a part of this patient's care.  Lucia Gaskins 11/09/2019 3:27 PM

## 2019-11-09 NOTE — Progress Notes (Signed)
Subjective: CC: Patient with continued RUQ abdominal pain with radiation to the back. Pleuritic in nature. Worse with palpation. Continue to have heartburn, reflux. Passing flatus. No BM overnight. Pain feels the same as yesterday.   Objective: Vital signs in last 24 hours: Temp:  [98.4 F (36.9 C)-99.5 F (37.5 C)] 98.4 F (36.9 C) (07/29 0524) Pulse Rate:  [80-92] 88 (07/29 0524) Resp:  [15-19] 15 (07/29 0524) BP: (119-133)/(64-82) 125/82 (07/29 0524) SpO2:  [96 %-98 %] 96 % (07/29 0524) Last BM Date: 11/05/19  Intake/Output from previous day: 07/28 0701 - 07/29 0700 In: 1359.5 [I.V.:1359.5] Out: 400 [Urine:400] Intake/Output this shift: Total I/O In: 0  Out: 300 [Urine:300]  PE: Gen:  Alert, NAD, pleasant Pulm: Normal rate and effort  Abd: Soft, ND, tenderness of the epigastrium and RUQ, +BS Ext: LE non-pitting edema Psych: A&Ox3  Skin: no rashes noted, warm and dry  Lab Results:  Recent Labs    11/08/19 0523 11/09/19 0517  WBC 7.1 6.9  HGB 10.1* 9.9*  HCT 35.1* 31.8*  PLT 256 236   BMET Recent Labs    11/08/19 0523 11/09/19 0517  NA 137 138  K 3.9 4.5  CL 104 105  CO2 22 24  GLUCOSE 72 75  BUN 8 7  CREATININE 0.61 0.60  CALCIUM 8.5* 8.9   PT/INR No results for input(s): LABPROT, INR in the last 72 hours. CMP     Component Value Date/Time   NA 138 11/09/2019 0517   K 4.5 11/09/2019 0517   CL 105 11/09/2019 0517   CO2 24 11/09/2019 0517   GLUCOSE 75 11/09/2019 0517   BUN 7 11/09/2019 0517   CREATININE 0.60 11/09/2019 0517   CALCIUM 8.9 11/09/2019 0517   PROT 6.0 (L) 11/09/2019 0517   ALBUMIN 3.4 (L) 11/09/2019 0517   AST 15 11/09/2019 0517   ALT 11 11/09/2019 0517   ALKPHOS 48 11/09/2019 0517   BILITOT 0.9 11/09/2019 0517   GFRNONAA >60 11/09/2019 0517   GFRAA >60 11/09/2019 0517   Lipase     Component Value Date/Time   LIPASE 27 11/07/2019 1034       Studies/Results: DG Chest 1 View  Result Date:  11/07/2019 CLINICAL DATA:  Pain EXAM: CHEST  1 VIEW COMPARISON:  October 15, 2019 FINDINGS: Lungs are clear. The heart size and pulmonary vascularity are normal. No adenopathy. No pneumothorax. No bone lesions. IMPRESSION: Lungs clear.  Cardiac silhouette within normal limits. Electronically Signed   By: Bretta Bang III M.D.   On: 11/07/2019 10:10   NM Hepato W/EF  Result Date: 11/08/2019 CLINICAL DATA:  Right upper quadrant abdominal pain. CT and ultrasound unremarkable. EXAM: NUCLEAR MEDICINE HEPATOBILIARY IMAGING WITH GALLBLADDER EF TECHNIQUE: Sequential images of the abdomen were obtained out to 60 minutes following intravenous administration of radiopharmaceutical. After oral ingestion of Ensure, gallbladder ejection fraction was determined. At 60 min, normal ejection fraction is greater than 33%. RADIOPHARMACEUTICALS:  5.4 mCi Tc-14m  Choletec IV COMPARISON:  CT 11/06/2019.  Ultrasound 11/05/2019. FINDINGS: Prompt uptake and biliary excretion of activity by the liver is seen. Gallbladder activity is visualized, consistent with patency of cystic duct. Biliary activity passes into small bowel, consistent with patent common bile duct. Calculated gallbladder ejection fraction is 85%. (Normal gallbladder ejection fraction with Ensure is greater than 33%.) IMPRESSION: Normal nuclear medicine hepatobiliary scan and gallbladder ejection fraction. Electronically Signed   By: Paulina Fusi M.D.   On: 11/08/2019 17:00  Anti-infectives: Anti-infectives (From admission, onward)   None       Assessment/Plan Hx asthma Hx Migraine Chronic Anemia  BMI 37.41 - recent 60lb weight gain. TRH considering workup for Cushing's  - Per TRH -   Upper abdominal pain, nausea, vomiting, diarrhea x 4 weeks GERD - Unclear etiology of the patients symptoms - RUQ [place] with Gallbladder polyps ~75mm;no gallstones; CBD 2 mm - CT with no gallstones/gallbladder wall thickening/biliary dilatation. Pancreas unremarkable.  There are tiny renal stones bilaterally without ureteral stone. Otherwise CT A/P wnl - HIDA negative for acute cholecystitis or biliary dyskinesia  - WBC normalized. LFT/lipase wnl - Agree with PPI BID. Avoid NSAIDs until etiology more clear.  - GI following. Likely will need EGD - Seen with Dr. Daphine Deutscher. He recommends UGI, CT chest, and Rheumatoid labs for further evaluation. It appears CTA of the chest has already been ordered by primary team.   FEN - NPO for GI eval, IVF VTE - SCDs, okay for chemical prophyalxis from a general surgery standpoint ID - None    LOS: 1 day    Jacinto Halim , [facility] 11/09/2019, 9:19 AM Please see Amion for pager number during day hours 7:00am-4:30pm

## 2019-11-09 NOTE — Progress Notes (Addendum)
[facility] Gastroenterology Progress Note  Vicki Moore 39 y.o. January 20, 1981  CC:  RUQ pain  Subjective: Patient reports feeling "the same" today as compared to yesterday.  Continued pain in the RUQ and right rib cage. Zofran has provided relief and patient has not had any episodes of emesis since admission.  She states she still feels nauseated and lacks an appetite.    ROS : Review of Systems  Cardiovascular: Negative for chest pain and palpitations.  Gastrointestinal: Positive for abdominal pain and nausea. Negative for blood in stool, constipation, diarrhea, heartburn, melena and vomiting.   Objective: Vital signs in last 24 hours: Vitals:   11/09/19 0524 11/09/19 1109  BP: 125/82   Pulse: 88   Resp: 15   Temp: 98.4 F (36.9 C)   SpO2: 96% 97%    Physical Exam:  General:  Alert, oriented, cooperative, no acute  Head:  Normocephalic, without obvious abnormality, atraumatic  Eyes:  Anicteric sclera, EOMs intact   Lungs:   Clear to auscultation bilaterally, respirations unlabored  Heart:  Regular rate and rhythm, S1, S2 normal  Abdomen:   Soft, moderate tenderness over right rib cage with some tenderness in RUQ directly under ribs, bowel sounds active all four quadrants,  no peritoneal signs   Extremities: Extremities normal, atraumatic, no  edema  Pulses: 2+ and symmetric    Lab Results: Recent Labs    11/08/19 0523 11/09/19 0517  NA 137 138  K 3.9 4.5  CL 104 105  CO2 22 24  GLUCOSE 72 75  BUN 8 7  CREATININE 0.61 0.60  CALCIUM 8.5* 8.9  MG  --  1.8  PHOS  --  3.2   Recent Labs    11/08/19 0523 11/09/19 0517  AST 12* 15  ALT 12 11  ALKPHOS 48 48  BILITOT 0.7 0.9  PROT 6.1* 6.0*  ALBUMIN 3.5 3.4*   Recent Labs    11/08/19 0523 11/09/19 0517  WBC 7.1 6.9  NEUTROABS  --  4.1  HGB 10.1* 9.9*  HCT 35.1* 31.8*  MCV 84.8 77.0*  PLT 256 236   No results for input(s): LABPROT, INR in the last 72 hours.  Assessment: RUQ abdominal pain Patient not  examined as she was in CT imaging. -Hgb 9.9 HIDA scan was normal yesterday.  Per surgical note, patient continues to have right upper quadrant pain, as well as heartburn.  Chronic anemia: stable. Hgb 9.9, though her iron panel as well as ferritin are normal (ferritin lower level of normal, 16).  CT-A 11/09/19: New small areas of interstitial thickening and minimal nodularity in the upper lobes, which may be infectious/inflammatory. Additional patchy atelectasis.  Plan:  Agree with proceeding with upper GI series.   At this time, it does not appear the patient requires and inpatient EGD based on findings.  However, we will await the upper GI series.  Continue Protonix IV 40 mg twice daily.  [facility] will follow.  Edrick Kins PA-C 11/09/2019, 12:13 PM  Contact #  [phone]

## 2019-11-10 LAB — COMPREHENSIVE METABOLIC PANEL
ALT: 14 U/L (ref 0–44)
AST: 14 U/L — ABNORMAL LOW (ref 15–41)
Albumin: 4 g/dL (ref 3.5–5.0)
Alkaline Phosphatase: 54 U/L (ref 38–126)
Anion gap: 11 (ref 5–15)
BUN: 7 mg/dL (ref 6–20)
CO2: 23 mmol/L (ref 22–32)
Calcium: 9.1 mg/dL (ref 8.9–10.3)
Chloride: 101 mmol/L (ref 98–111)
Creatinine, Ser: 0.58 mg/dL (ref 0.44–1.00)
GFR calc Af Amer: >60 mL/min (ref >60)
GFR calc non Af Amer: >60 mL/min (ref >60)
Glucose, Bld: 84 mg/dL (ref 70–99)
Potassium: 4.4 mmol/L (ref 3.5–5.1)
Sodium: 135 mmol/L (ref 135–145)
Total Bilirubin: 0.7 mg/dL (ref 0.3–1.2)
Total Protein: 7 g/dL (ref 6.5–8.1)

## 2019-11-10 LAB — CBC WITH DIFFERENTIAL/PLATELET
Abs Immature Granulocytes: 0.03 10*3/uL (ref 0.00–0.07)
Basophils Absolute: 0 10*3/uL (ref 0.0–0.1)
Basophils Relative: 0 %
Eosinophils Absolute: 0.2 10*3/uL (ref 0.0–0.5)
Eosinophils Relative: 2 %
HCT: 35.3 % — ABNORMAL LOW (ref 36.0–46.0)
Hemoglobin: 10.5 g/dL — ABNORMAL LOW (ref 12.0–15.0)
Immature Granulocytes: 1 %
Lymphocytes Relative: 13 %
Lymphs Abs: 0.8 10*3/uL (ref 0.7–4.0)
MCH: 23.7 pg — ABNORMAL LOW (ref 26.0–34.0)
MCHC: 29.7 g/dL — ABNORMAL LOW (ref 30.0–36.0)
MCV: 79.7 fL — ABNORMAL LOW (ref 80.0–100.0)
Monocytes Absolute: 0.3 10*3/uL (ref 0.1–1.0)
Monocytes Relative: 4 %
Neutro Abs: 5.2 10*3/uL (ref 1.7–7.7)
Neutrophils Relative %: 80 %
Platelets: 261 10*3/uL (ref 150–400)
RBC: 4.43 MIL/uL (ref 3.87–5.11)
RDW: 16.5 % — ABNORMAL HIGH (ref 11.5–15.5)
WBC: 6.5 10*3/uL (ref 4.0–10.5)
nRBC: 0 % (ref 0.0–0.2)

## 2019-11-10 LAB — URINALYSIS, ROUTINE W REFLEX MICROSCOPIC
Bilirubin Urine: NEGATIVE
Glucose, UA: NEGATIVE mg/dL
Hgb urine dipstick: NEGATIVE
Ketones, ur: 80 mg/dL — AB
Leukocytes,Ua: NEGATIVE
Nitrite: NEGATIVE
Protein, ur: NEGATIVE mg/dL
Specific Gravity, Urine: 1.014 (ref 1.005–1.030)
pH: 5 (ref 5.0–8.0)

## 2019-11-10 LAB — RHEUMATOID FACTOR: Rheumatoid fact SerPl-aCnc: 11.8 IU/mL (ref 0.0–13.9)

## 2019-11-10 LAB — PHOSPHORUS: Phosphorus: 3.9 mg/dL (ref 2.5–4.6)

## 2019-11-10 LAB — ANA: Anti Nuclear Antibody (ANA): NEGATIVE

## 2019-11-10 LAB — CORTISOL-AM, BLOOD: Cortisol - AM: 1.7 ug/dL — ABNORMAL LOW (ref 6.7–22.6)

## 2019-11-10 LAB — SEDIMENTATION RATE: Sed Rate: 28 mm/hr — ABNORMAL HIGH (ref 0–22)

## 2019-11-10 LAB — MAGNESIUM: Magnesium: 1.9 mg/dL (ref 1.7–2.4)

## 2019-11-10 MED ORDER — LIDOCAINE 5 % EX PTCH
1.0000 | MEDICATED_PATCH | CUTANEOUS | Status: DC
  Administered 2019-11-10 – 2019-11-14 (×5): 1 via TRANSDERMAL
  Filled 2019-11-10 (×7): qty 1

## 2019-11-10 MED ORDER — TRAZODONE HCL 100 MG PO TABS
100.0000 mg | ORAL_TABLET | Freq: Every day | ORAL | Status: DC
  Administered 2019-11-10 – 2019-11-14 (×5): 100 mg via ORAL
  Filled 2019-11-10 (×5): qty 1

## 2019-11-10 MED ORDER — HYDROMORPHONE HCL 1 MG/ML IJ SOLN
1.0000 mg | Freq: Once | INTRAMUSCULAR | Status: AC | PRN
  Administered 2019-11-10: 1 mg via INTRAVENOUS
  Filled 2019-11-10: qty 1

## 2019-11-10 MED ORDER — PREDNISONE 20 MG PO TABS
60.0000 mg | ORAL_TABLET | Freq: Every day | ORAL | Status: DC
  Administered 2019-11-10 – 2019-11-12 (×3): 60 mg via ORAL
  Filled 2019-11-10 (×4): qty 3

## 2019-11-10 MED ORDER — FERROUS SULFATE 325 (65 FE) MG PO TABS
325.0000 mg | ORAL_TABLET | Freq: Every day | ORAL | Status: DC
  Administered 2019-11-11 – 2019-11-14 (×3): 325 mg via ORAL
  Filled 2019-11-10 (×5): qty 1

## 2019-11-10 NOTE — Progress Notes (Addendum)
PROGRESS NOTE    Vicki Moore  WUJ:[id number] DOB: [birth year] DOA: 11/07/2019 PCP: Patient, No Pcp Per   Chief Complaint  Patient presents with  . Abdominal Pain    Brief Narrative:  This is Vicki Moore very pleasant 39 year old female with Vicki Moore history of migraines, asthma, GERD and anxiety who presented to the ED with persistent RUQ abdominal pain with radiation to right shoulder and decreased p.o. intake.   She has been seen in the ED over the past month several times for similar issues most notably on 7/4 at which point her vitals were stable and without fever.  RUQ on 7/5 ultrasound revealed single 5 mm focus which could be stone versus polyp in the gallbladder, common bile duct was 3.1 mm.  She was discharged with Zofran and Bentyl.  She returned on 7/25 with reports of Vicki Moore large gallstone on an outpatient ultrasound with worsening RUQ pain and right shoulder pain.  Also reported nausea and vomiting at the time for which she was taking Mylanta without much relief.  Had an abdominal ultrasound on 7/25 with no gallbladder wall thickening and multiple small gallbladder wall polyps measuring approximately 3 mm with positive Murphy sign, CBD 2 mm without cholecystitis or cholelithiasis.  CT scan abdomen on 7/25 showed nonobstructing nephrolithiasis bilaterally and otherwise unremarkable.  She was discharged with plans for GI follow-up.  Unfortunately, she returned again today with persistent RUQ pain and radiation to right shoulder.  She has only been able to have chicken broth and is unable to keep anything else down.  Of note, she used to be Vicki Moore Horticulturist, commercial but injured herself two years ago and has since been more sedentary and has gained 60+ pounds this year which is causing her significant stress on top of her current issues.  ED Course: Vital signs unremarkable, WBC 12.5 on 7/25-> 10.7 today.  Hb 10.2, down from 11.32 days ago.  Glucose 68, beta hCG borderline elevated at 5.4 (n <5). ED MD discussed with  GI who recommended surgery consult who ordered GI panel and PPI.  Assessment & Plan:   Principal Problem:   RUQ pain Active Problems:   Anxiety state   Asthma   GERD (gastroesophageal reflux disease)   Right upper quadrant pain  Will do dose of dilaudid tonight x1, discussed after this, no additional IV pain meds.  Will focus on oral/topical medicines.  Treating for possible shingles/musculoskeletal pain.    1. Intractable RUQ pain with radiation to right shoulder  Concern for Shingles Vicki Moore. Unclear etiology at this time - rash with mildly erythematous papules/vesicular appearance on abdomen, appears possibly consistent with shingles seen today on abdomen (story doesn't fit perfectly with shingles as she's been in and out of ED for about 1 month at this time and rash isn't particularly impressive, would think she'd have rash sooner, but will treat for this) b. Start valcyclovir, gabapentin, steroids, follow c. HIDA negative, upper GI with small sliding hiatal hernia, chest CT with areas of interstitial thickening and minimal nodularity, infectious vs inflammatory  d. CT abdomen without acute abnormality - RUQ [place] with small gallbladder polyp e. GI c/s, appreciate recs - recommending PPI, follow up with PCP or [facility] for GERD in -6 weeks, consider lidocaine patch - no recommendation for inpatient EGD at this time - repeat UA f. Surgery c/s, appreciate recs g. GI path panel d/c'd per GI h. Continue PPI BID  2. Anxiety  Chronic Pain Syndrome  History of Polysubstance Abuse Vicki Moore. TSH, wnl  b. Ativan PRN c. Hx of polysubstance abuse per care everywhere (will need to discuss additionally with pt), but recently she's been seeing [name] for chronic pain and getting buprenorphine, percocet, methylphenidate, and lyrica from him.  Apparently Dr. Tollie Eth is no longer going to be practicing and she'll have to find someone else for chronic pain.  d. Limit IV pain meds, prioritize oral and  non opiate pain meds e. Consider starting SSRI at discharge if she is tolerating PO  3. Community Acquired Pneumonia:  1. CT with interstitial thickening and nodularity in upper lobes, will treat with abx and will need follow up imaging outpatient  4. Hair Loss  Joint Pain:  Follow ANA (negative), RF (negative), anti CCP, sed rate (mildly elevated), CRP (negative).   5. Weight Gain  Swelling: pt describes lower extremity swelling and facial swelling on/off over past several months.  She was on steroids as an outpatient, but no longer taking this.  UA negative for protein.  Follow BNP (wnl).    1. Negative dex suppression 2. Follow up additionally as outpatient   6. GERD Vicki Moore. Increase PPI BID  4. Asthma Vicki Moore. Continue home inhalers  DVT prophylaxis: SCD Code Status: full  Family Communication: none Disposition:   Status is: inpatient  The patient will require care spanning > 2 midnights and should be moved to inpatient because: Inpatient level of care appropriate due to severity of illness  Dispo: The patient is from: Home              Anticipated d/c is to: Home              Anticipated d/c date is: 1 day              Patient currently is not medically stable to d/c.  Consultants:   GI  Surgery  Procedures:   none  Antimicrobials:  Anti-infectives (From admission, onward)   Start     Dose/Rate Route Frequency Ordered Stop   11/09/19 1615  valACYclovir (VALTREX) tablet 1,000 mg     Discontinue     1,000 mg Oral 3 times daily 11/09/19 1602     11/09/19 1600  levofloxacin (LEVAQUIN) IVPB 750 mg     Discontinue     750 mg 100 mL/hr over 90 Minutes Intravenous Daily 11/09/19 1509 11/14/19 1559     Subjective: Feels generally poorly   Objective: Vitals:   11/10/19 0503 11/10/19 0905 11/10/19 1126 11/10/19 1432  BP: 120/81 111/73  111/74  Pulse: 97 102  (!) 109  Resp: 18 20  20   Temp: 98.8 F (37.1 C) 98.7 F (37.1 C)  98.2 F (36.8 C)  TempSrc:  Oral  Oral    SpO2: 95% 95% 94% 96%    Intake/Output Summary (Last 24 hours) at 11/10/2019 1507 Last data filed at 11/10/2019 1141 Gross per 24 hour  Intake 626 ml  Output 300 ml  Net 326 ml   There were no vitals filed for this visit.  Examination:  General: No acute distress. Cardiovascular: Heart sounds show Vicki Moore regular rate, and rhythm Lungs: Clear to auscultation bilaterally  Abdomen: Soft, nontender, nondistended  TTP on R side Neurological: Alert and oriented 3. Moves all extremities 4. Cranial nerves II through XII grossly intact. Skin: mild rash  Extremities: No clubbing or cyanosis. No edema.  Data Reviewed: I have personally reviewed following labs and imaging studies  CBC: Recent Labs  Lab 11/05/19 2241 11/07/19 1034 11/08/19 0523 11/09/19 11/11/19  11/10/19 0336  WBC 12.5* 10.7* 7.1 6.9 6.5  NEUTROABS 11.2*  --   --  4.1 5.2  HGB 11.3* 10.2* 10.1* 9.9* 10.5*  HCT 36.3 33.6* 35.1* 31.8* 35.3*  MCV 79.4* 80.4 84.8 77.0* 79.7*  PLT 312 274 256 236 261    Basic Metabolic Panel: Recent Labs  Lab 11/05/19 2241 11/07/19 1034 11/08/19 0523 11/09/19 0517 11/10/19 0336  NA 138 141 137 138 135  K 4.4 3.5 3.9 4.5 4.4  CL 104 106 104 105 101  CO2 23 24 22 24 23   GLUCOSE 116* 68* 72 75 84  BUN 14 7 8 7 7   CREATININE 0.74 0.59 0.61 0.60 0.58  CALCIUM 9.0 8.4* 8.5* 8.9 9.1  MG  --   --   --  1.8 1.9  PHOS  --   --   --  3.2 3.9    GFR: Estimated Creatinine Clearance: 97.2 mL/min (by C-G formula based on SCr of 0.58 mg/dL).  Liver Function Tests: Recent Labs  Lab 11/05/19 2241 11/07/19 1034 11/08/19 0523 11/09/19 0517 11/10/19 0336  AST 14* 14* 12* 15 14*  ALT 13 12 12 11 14   ALKPHOS 58 48 48 48 54  BILITOT 0.4 0.2* 0.7 0.9 0.7  PROT 7.4 6.7 6.1* 6.0* 7.0  ALBUMIN 4.1 3.7 3.5 3.4* 4.0    CBG: No results for input(s): GLUCAP in the last 168 hours.   Recent Results (from the past 240 hour(s))  SARS Coronavirus 2 by RT PCR (hospital order, performed in [facility] hospital lab) Nasopharyngeal Nasopharyngeal Swab     Status: None   Collection Time: 11/07/19  3:06 PM   Specimen: Nasopharyngeal Swab  Result Value Ref Range Status   SARS Coronavirus 2 NEGATIVE NEGATIVE Final    Comment: (NOTE) SARS-CoV-2 target nucleic acids are NOT DETECTED.  The SARS-CoV-2 RNA is generally detectable in upper and lower respiratory specimens during the acute phase of infection. The lowest concentration of SARS-CoV-2 viral copies this assay can detect is 250 copies / mL. Vicki Moore negative result does not preclude SARS-CoV-2 infection and should not be used as the sole basis for treatment or other patient management decisions.  Camella Seim negative result may occur with improper specimen collection / handling, submission of specimen other than nasopharyngeal swab, presence of viral mutation(s) within the areas targeted by this assay, and inadequate number of viral copies (<250 copies / mL). Vicki Moore negative result must be combined with clinical observations, patient history, and epidemiological information.  Fact Sheet for Patients:    Fact Sheet for Healthcare Providers: [facility]  This test is not yet approved or  cleared by the 11/09/19 FDA and has been authorized for detection and/or diagnosis of SARS-CoV-2 by FDA under an Emergency Use Authorization (EUA).  This EUA will remain in effect (meaning this test can be used) for the duration of the COVID-19 declaration under Section 564(b)(1) of the Act, 21 U.S.C. section 360bbb-3(b)(1), unless the authorization is terminated or revoked sooner.  Performed at [facility], [address], [place], [place]   Surgical pcr screen     Status: Abnormal   Collection Time: 11/07/19  9:04 PM   Specimen: Nasal Mucosa; Nasal Swab  Result Value Ref Range Status   MRSA, PCR NEGATIVE NEGATIVE Final   Staphylococcus aureus POSITIVE  (Vicki Moore) NEGATIVE Final    Comment: (NOTE) The Xpert SA Assay (FDA approved for NASAL specimens in patients 52 years of age and older), is one  component of Vicki Moore comprehensive surveillance program. It is not intended to diagnose infection nor to guide or monitor treatment. Performed at [facility], [address] [place], [place] [address]          Radiology Studies: CT ANGIO CHEST PE W OR WO CONTRAST  Result Date: 11/09/2019 CLINICAL DATA:  Chest pain, shortness of breath EXAM: CT ANGIOGRAPHY CHEST WITH CONTRAST TECHNIQUE: Multidetector CT imaging of the chest was performed using the standard protocol during bolus administration of intravenous contrast. Multiplanar CT image reconstructions and MIPs were obtained to evaluate the vascular anatomy. CONTRAST:  100mL OMNIPAQUE IOHEXOL 350 MG/ML SOLN COMPARISON:  2019 FINDINGS: Cardiovascular: Satisfactory opacification of the pulmonary arteries to the segmental level. No evidence of pulmonary embolism. Normal heart size. No pericardial effusion. Mediastinum/Nodes: There are no enlarged lymph nodes identified. Visualized thyroid is unremarkable. Esophagus is unremarkable. Lungs/Pleura: Mild interstitial thickening with minimal nodularity in the upper lobes is new from 2018. Additional patchy atelectasis. No pleural effusion or pneumothorax. Upper Abdomen: No acute abnormality. Musculoskeletal: No acute osseous abnormality. Review of the MIP images confirms the above findings. IMPRESSION: No evidence of acute pulmonary embolism. New small areas of interstitial thickening and minimal nodularity in the upper lobes, which may be infectious/inflammatory. Additional patchy atelectasis. Electronically Signed   By: Guadlupe SpanishPraneil  Patel M.D.   On: 11/09/2019 10:52   NM Hepato W/EF  Result Date: 11/08/2019 CLINICAL DATA:  Right upper quadrant abdominal pain. CT and ultrasound unremarkable. EXAM: NUCLEAR MEDICINE HEPATOBILIARY IMAGING WITH GALLBLADDER EF  TECHNIQUE: Sequential images of the abdomen were obtained out to 60 minutes following intravenous administration of radiopharmaceutical. After oral ingestion of Ensure, gallbladder ejection fraction was determined. At 60 min, normal ejection fraction is greater than 33%. RADIOPHARMACEUTICALS:  5.4 mCi Tc-5832m  Choletec IV COMPARISON:  CT 11/06/2019.  Ultrasound 11/05/2019. FINDINGS: Prompt uptake and biliary excretion of activity by the liver is seen. Gallbladder activity is visualized, consistent with patency of cystic duct. Biliary activity passes into small bowel, consistent with patent common bile duct. Calculated gallbladder ejection fraction is 85%. (Normal gallbladder ejection fraction with Ensure is greater than 33%.) IMPRESSION: Normal nuclear medicine hepatobiliary scan and gallbladder ejection fraction. Electronically Signed   By: Paulina FusiMark  Shogry M.D.   On: 11/08/2019 17:00   DG UGI W SINGLE CM (SOL OR THIN BA)  Result Date: 11/09/2019 CLINICAL DATA:  Right upper quadrant abdominal pain. Additional history provided by patient: Patient reports dysphagia, right upper quadrant pain, right lower rib pain, intractable nausea/vomiting. EXAM: UPPER GI SERIES WITH KUB TECHNIQUE: After obtaining Vicki Moore scout radiograph Vicki Moore routine upper GI series was performed using thin and high density barium. FLUOROSCOPY TIME:  Fluoroscopy Time:  2 minutes, 24 seconds. Radiation Exposure Index (if provided by the fluoroscopic device): 83.6 mGy Number of Acquired Spot Images: 8 COMPARISON:  CT angiogram chest 11/09/2019, CT abdomen/pelvis 11/06/2019 FINDINGS: Madyn Ivins scout radiograph of the abdomen demonstrates residual contrast within the renal collecting systems and bladder. There is Vicki Moore nonobstructive bowel gas pattern. No acute bony abnormality identified. Fluoroscopic evaluation demonstrates normal caliber and smooth contour of the esophagus. No evidence of fixed stricture, mass or mucosal abnormality. Normal esophageal motility was  observed. No gastroesophageal reflux was observed. The patient swallowed Vicki Moore Ade 13 mm barium tablet, which freely passed into the stomach. Small sliding hiatal hernia. Otherwise normal appearance of the stomach, duodenal bulb and duodenal sweep. No evidence of ulceration, fold thickening or mass. IMPRESSION: Small sliding hiatal hernia. Otherwise unremarkable upper GI series as  detailed. Electronically Signed   By: Jackey Loge DO   On: 11/09/2019 12:58        Scheduled Meds: . acetaminophen  1,000 mg Oral Q8H  . albuterol  2.5 mg Nebulization BID  . diclofenac Sodium  2 g Topical QID  . fluticasone furoate-vilanterol  1 puff Inhalation Daily  . gabapentin  300 mg Oral TID  . nicotine  14 mg Transdermal Daily  . pantoprazole (PROTONIX) IV  40 mg Intravenous Q12H  . predniSONE  60 mg Oral Q breakfast  . valACYclovir  1,000 mg Oral TID   Continuous Infusions: . levofloxacin (LEVAQUIN) IV 750 mg (11/09/19 1943)     LOS: 2 days    Time spent: over 30 min    Lacretia Nicks, MD [facility]   To contact the attending provider between 7A-7P or the covering provider during after hours 7P-7A, please log into the web site [email or web address] and access using universal  password for that web site. If you do not have the password, please call the hospital operator.  11/10/2019, 3:07 PM

## 2019-11-10 NOTE — Progress Notes (Addendum)
CC: Right upper quadrant abdominal pain  Subjective: Being treated for shingles and is in respiratory isolation.  Still complaining of the same RUQ pain with no real change from admit, if anything she seems to have more discomfort with palpation this AM than she did when I saw her in the ED.  Objective: Vital signs in last 24 hours: Temp:  [98 F (36.7 C)-98.8 F (37.1 C)] 98.8 F (37.1 C) (07/30 0503) Pulse Rate:  [95-109] 97 (07/30 0503) Resp:  [16-19] 18 (07/30 0503) BP: (110-137)/(66-88) 120/81 (07/30 0503) SpO2:  [94 %-97 %] 95 % (07/30 0503) Last BM Date: 11/05/19 240 p.o. 150 IV Urine 1500 No BM recorded Afebrile vital signs are stable CMP is normal WBC 6.5 H/H 10.5/35.3 Sed rate is 28  cortisol 1.7  Intake/Output from previous day: 07/29 0701 - 07/30 0700 In: 390 [P.O.:240; IV Piggyback:150] Out: 1500 [Urine:1500] Intake/Output this shift: No intake/output data recorded.  General appearance: alert, cooperative and no distress Resp: clear to auscultation bilaterally GI: remains tender RUQ with any palpation.  + BS, no distension or peritonitis Skin: she has some rash on the right side.  Lab Results:  Recent Labs    11/09/19 0517 11/10/19 0336  WBC 6.9 6.5  HGB 9.9* 10.5*  HCT 31.8* 35.3*  PLT 236 261    BMET Recent Labs    11/09/19 0517 11/10/19 0336  NA 138 135  K 4.5 4.4  CL 105 101  CO2 24 23  GLUCOSE 75 84  BUN 7 7  CREATININE 0.60 0.58  CALCIUM 8.9 9.1   PT/INR No results for input(s): LABPROT, INR in the last 72 hours.  Recent Labs  Lab 11/05/19 2241 11/07/19 1034 11/08/19 0523 11/09/19 0517 11/10/19 0336  AST 14* 14* 12* 15 14*  ALT 13 12 12 11 14   ALKPHOS 58 48 48 48 54  BILITOT 0.4 0.2* 0.7 0.9 0.7  PROT 7.4 6.7 6.1* 6.0* 7.0  ALBUMIN 4.1 3.7 3.5 3.4* 4.0     Lipase     Component Value Date/Time   LIPASE 27 11/07/2019 1034     Medications: . acetaminophen  1,000 mg Oral Q8H  . albuterol  2.5 mg  Nebulization BID  . Chlorhexidine Gluconate Cloth  6 each Topical Daily  . diclofenac Sodium  2 g Topical QID  . fluticasone furoate-vilanterol  1 puff Inhalation Daily  . gabapentin  300 mg Oral TID  . mupirocin ointment  1 application Nasal BID  . nicotine  14 mg Transdermal Daily  . pantoprazole (PROTONIX) IV  40 mg Intravenous Q12H  . predniSONE  60 mg Oral Q breakfast  . valACYclovir  1,000 mg Oral TID    Assessment/Plan Hx asthma Hx Migraine Chronic Anemia BMI 37.41 - recent 60lb weight gain. TRH considering workup for Cushing's  - Per TRH -  Upper abdominal pain, nausea, vomiting, diarrhea x 4 weeks GERD - Unclear etiology of the patients symptoms - RUQ 11/09/2019 withGallbladder polyps ~30mm;no gallstones; CBD 2 mm - CTwithno gallstones/gallbladder wall thickening/biliary dilatation. Pancreasunremarkable. There are tiny renal stones bilaterally without ureteral stone. Otherwise CT A/P wnl - HIDA negative for acute cholecystitis or biliary dyskinesia  - UGI:  Small sliding hiatal hernia - CT angio chest: No evidence of acute pulmonary embolism.  New small areas of interstitial     thickening minimal nodularity upper lobes which may be infectious/inflammatory/patchy     atelectasis. - WBC normalized.LFT/lipasewnl - Agree with PPI BID. Avoid NSAIDs until etiology  more clear.  - GI following. Cyclic citrul peptide antibody/ANA test pending   FEN -NPO for GI eval, IVF VTE -SCDs, okay for chemical prophyalxis from a general surgery standpoint ID -None  Plan:  Currently no surgical findings.  Gi has recommended PPI BID, lidocaine patch, and follow up in their office.  It was their opinion she did not require IP EGD.  We have no further recommendations.  Please call if we can help.        LOS: 2 days    Norelle Runnion 11/10/2019 Please see Amion

## 2019-11-10 NOTE — Progress Notes (Signed)
[facility] Progress Note  Vicki Moore 39 y.o. [birth year]  CC:  RUQ pain  Subjective: Patient reports continued pain in the RUQ and right rib cage with radiation to right shoulder. Has not had any episodes of vomiting since admission. She states she still feels nauseated and lacks an appetite.  Heartburn has improved since Protonix initiation. She is having suprapubic pain with urination today.  ROS : Review of Systems  Cardiovascular: Negative for chest pain and palpitations.  Gastrointestinal: Positive for abdominal pain and nausea. Negative for blood in stool, constipation, diarrhea, heartburn, melena and vomiting.  Genitourinary: Positive for dysuria. Negative for hematuria.   Objective: Vital signs in last 24 hours: Vitals:   11/10/19 0503 11/10/19 0905  BP: 120/81 111/73  Pulse: 97 102  Resp: 18 20  Temp: 98.8 F (37.1 C) 98.7 F (37.1 C)  SpO2: 95% 95%    Physical Exam:  General:  Alert, oriented, cooperative, no acute  Head:  Normocephalic, without obvious abnormality, atraumatic  Eyes:  Anicteric sclera, EOMs intact   Lungs:   Clear to auscultation bilaterally, respirations unlabored  Heart:  Regular rate and rhythm, S1, S2 normal  Abdomen:   Soft, moderate tenderness over right rib cage with some tenderness in RUQ directly under ribs, bowel sounds active all four quadrants,  no peritoneal signs   Extremities: Extremities normal, atraumatic, no  edema  Skin: Small rash under L breast and extending across midline, possibly related to irritation from undergarments    Lab Results: Recent Labs    11/09/19 0517 11/10/19 0336  NA 138 135  K 4.5 4.4  CL 105 101  CO2 24 23  GLUCOSE 75 84  BUN 7 7  CREATININE 0.60 0.58  CALCIUM 8.9 9.1  MG 1.8 1.9  PHOS 3.2 3.9   Recent Labs    11/09/19 0517 11/10/19 0336  AST 15 14*  ALT 11 14  ALKPHOS 48 54  BILITOT 0.9 0.7  PROT 6.0* 7.0  ALBUMIN 3.4* 4.0   Recent Labs    11/09/19 0517  11/10/19 0336  WBC 6.9 6.5  NEUTROABS 4.1 5.2  HGB 9.9* 10.5*  HCT 31.8* 35.3*  MCV 77.0* 79.7*  PLT 236 261   No results for input(s): LABPROT, INR in the last 72 hours.  Assessment: RUQ abdominal pain Patient not examined as she was in CT imaging. -Hgb 10.5, normal ferritin and iron studies -Normal HIDA scan 11/08/19 -Normal upper GI series 11/09/19  Chronic anemia: stable. Hgb 10.5 Iron panel and ferritin are normal (ferritin lower level of normal, 16).  CT-A 11/09/19: New small areas of interstitial thickening and minimal nodularity in the upper lobes, which may be infectious/inflammatory. Additional patchy atelectasis.  Dysuria  Plan:  Do not suspect gastric etiology of symptoms; inpatient EGD not warranted at this time.  Repeat UA ordered due to dysuria.  Continue Protonix IV 40 mg twice daily while inpatient. Transition to 40 mg PO qd once able to tolerate PO intake.  Continue Protonix at discharge.  Patient educated on side effects Protonix use, especially during prolonged use, to include decreased renal function and decreased bone density.  Follow up with PCP or [facility] for GERD in 4-6 weeks.  Consider lidocaine patch over L rib cage for localized pain relief.   [facility] will sign off. Please contact us if we can be of any further assistance during this hospital stay.  Edrick Kins PA-C 11/10/2019, 9:36 AM  Contact #  [phone]

## 2019-11-11 ENCOUNTER — Inpatient Hospital Stay (HOSPITAL_COMMUNITY): Payer: Medicaid Other

## 2019-11-11 LAB — COMPREHENSIVE METABOLIC PANEL
ALT: 10 U/L (ref 0–44)
AST: 10 U/L — ABNORMAL LOW (ref 15–41)
Albumin: 3.5 g/dL (ref 3.5–5.0)
Alkaline Phosphatase: 43 U/L (ref 38–126)
Anion gap: 13 (ref 5–15)
BUN: 14 mg/dL (ref 6–20)
CO2: 20 mmol/L — ABNORMAL LOW (ref 22–32)
Calcium: 9.2 mg/dL (ref 8.9–10.3)
Chloride: 104 mmol/L (ref 98–111)
Creatinine, Ser: 0.62 mg/dL (ref 0.44–1.00)
GFR calc Af Amer: >60 mL/min (ref >60)
GFR calc non Af Amer: >60 mL/min (ref >60)
Glucose, Bld: 91 mg/dL (ref 70–99)
Potassium: 3.5 mmol/L (ref 3.5–5.1)
Sodium: 137 mmol/L (ref 135–145)
Total Bilirubin: 0.7 mg/dL (ref 0.3–1.2)
Total Protein: 6.3 g/dL — ABNORMAL LOW (ref 6.5–8.1)

## 2019-11-11 LAB — CBC WITH DIFFERENTIAL/PLATELET
Abs Immature Granulocytes: 0.03 10*3/uL (ref 0.00–0.07)
Basophils Absolute: 0 10*3/uL (ref 0.0–0.1)
Basophils Relative: 0 %
Eosinophils Absolute: 0.1 10*3/uL (ref 0.0–0.5)
Eosinophils Relative: 1 %
HCT: 35 % — ABNORMAL LOW (ref 36.0–46.0)
Hemoglobin: 10.5 g/dL — ABNORMAL LOW (ref 12.0–15.0)
Immature Granulocytes: 0 %
Lymphocytes Relative: 18 %
Lymphs Abs: 1.5 10*3/uL (ref 0.7–4.0)
MCH: 24.2 pg — ABNORMAL LOW (ref 26.0–34.0)
MCHC: 30 g/dL (ref 30.0–36.0)
MCV: 80.6 fL (ref 80.0–100.0)
Monocytes Absolute: 0.7 10*3/uL (ref 0.1–1.0)
Monocytes Relative: 8 %
Neutro Abs: 6.2 10*3/uL (ref 1.7–7.7)
Neutrophils Relative %: 73 %
Platelets: 270 10*3/uL (ref 150–400)
RBC: 4.34 MIL/uL (ref 3.87–5.11)
RDW: 16.9 % — ABNORMAL HIGH (ref 11.5–15.5)
WBC: 8.5 10*3/uL (ref 4.0–10.5)
nRBC: 0 % (ref 0.0–0.2)

## 2019-11-11 LAB — PHOSPHORUS: Phosphorus: 4.3 mg/dL (ref 2.5–4.6)

## 2019-11-11 LAB — MAGNESIUM: Magnesium: 1.9 mg/dL (ref 1.7–2.4)

## 2019-11-11 MED ORDER — HYDROMORPHONE HCL 1 MG/ML IJ SOLN
0.5000 mg | Freq: Once | INTRAMUSCULAR | Status: AC
  Administered 2019-11-11: 0.5 mg via INTRAVENOUS
  Filled 2019-11-11: qty 0.5

## 2019-11-11 MED ORDER — HYDROMORPHONE HCL 1 MG/ML IJ SOLN
1.0000 mg | Freq: Once | INTRAMUSCULAR | Status: DC | PRN
  Administered 2019-11-11: 1 mg via INTRAVENOUS
  Filled 2019-11-11: qty 1

## 2019-11-11 MED ORDER — CYCLOBENZAPRINE HCL 5 MG PO TABS
5.0000 mg | ORAL_TABLET | Freq: Three times a day (TID) | ORAL | Status: DC | PRN
  Administered 2019-11-11 – 2019-11-14 (×3): 5 mg via ORAL
  Filled 2019-11-11 (×5): qty 1

## 2019-11-11 MED ORDER — SODIUM CHLORIDE 0.9% FLUSH
10.0000 mL | INTRAVENOUS | Status: DC | PRN
  Administered 2019-11-12: 20 mL

## 2019-11-11 MED ORDER — SODIUM CHLORIDE 0.9% FLUSH
10.0000 mL | Freq: Two times a day (BID) | INTRAVENOUS | Status: DC
  Administered 2019-11-11: 30 mL
  Administered 2019-11-11: 10 mL
  Administered 2019-11-12: 40 mL
  Administered 2019-11-12: 20 mL
  Administered 2019-11-14 (×2): 10 mL

## 2019-11-11 NOTE — Progress Notes (Addendum)
Patient pulse rate has come down. Patient complains of continued pain and anxiety. PRN medications were given to help relieve symptoms. Charge nurse aware. MD aware. Patient was continuing to monitor her pulse on vitals machine which continued to add to her frustration and anxiety. This nurse educated patient on relaxation breathing. This Clinical research associate will continue to monitor.

## 2019-11-11 NOTE — Progress Notes (Signed)
Have called on call MRI tech, Burton Apley, listed on Amion at number [phone]. He did no answer. Left a message for him to call back for this patient's STAT MRI.

## 2019-11-11 NOTE — Progress Notes (Signed)
MRI tech, Trey Paula, is now here to pick pt up for MRI.

## 2019-11-11 NOTE — Progress Notes (Signed)
PT Cancellation Note  Patient Details Name: Vicki Moore MRN: [id number] DOB: 10/30/1980   Cancelled Treatment:    Reason Eval/Treat Not Completed: PT screened, no needs identified, will sign off. Patient reports being up ad lib. Patient reports has back pain re: fractured coccyx from 3 years ago. Patient could benefit from a K PAD /heating pad at bedside as she reports heat does help discomfort.   Rada Hay 11/11/2019, 9:50 AM  Blanchard Kelch PT Acute Rehabilitation Services Pager [phone] Office [phone]

## 2019-11-11 NOTE — Progress Notes (Signed)
PROGRESS NOTE    Vicki Moore  VPX:[id number] DOB: [birth year] DOA: 11/07/2019 PCP: Patient, No Pcp Per   Chief Complaint  Patient presents with  . Abdominal Pain    Brief Narrative:  This is Vicki Moore very pleasant 39 year old female with Vicki Moore history of migraines, asthma, GERD and anxiety who presented to the ED with persistent RUQ abdominal pain with radiation to right shoulder and decreased p.o. intake.   She has been seen in the ED over the past month several times for similar issues most notably on 7/4 at which point her vitals were stable and without fever.  RUQ on 7/5 ultrasound revealed single 5 mm focus which could be stone versus polyp in the gallbladder, common bile duct was 3.1 mm.  She was discharged with Zofran and Bentyl.  She returned on 7/25 with reports of Vicki Moore large gallstone on an outpatient ultrasound with worsening RUQ pain and right shoulder pain.  Also reported nausea and vomiting at the time for which she was taking Mylanta without much relief.  Had an abdominal ultrasound on 7/25 with no gallbladder wall thickening and multiple small gallbladder wall polyps measuring approximately 3 mm with positive Murphy sign, CBD 2 mm without cholecystitis or cholelithiasis.  CT scan abdomen on 7/25 showed nonobstructing nephrolithiasis bilaterally and otherwise unremarkable.  She was discharged with plans for GI follow-up.  Unfortunately, she returned again today with persistent RUQ pain and radiation to right shoulder.  She has only been able to have chicken broth and is unable to keep anything else down.  Of note, she used to be [employer], commercial but injured herself two years ago and has since been more sedentary and has gained 60+ pounds this year which is causing her significant stress on top of her current issues.  ED Course: Vital signs unremarkable, WBC 12.5 on 7/25-> 10.7 today.  Hb 10.2, down from 11.32 days ago.  Glucose 68, beta hCG borderline elevated at 5.4 (n <5). ED MD discussed with  GI who recommended surgery consult who ordered GI panel and PPI.  Assessment & Plan:   Principal Problem:   RUQ pain Active Problems:   Anxiety state   Asthma   GERD (gastroesophageal reflux disease)   Right upper quadrant pain  1. Intractable RUQ pain with radiation to right shoulder  Concern for Shingles Daiwik Buffalo. Unclear etiology at this time - rash with mildly erythematous papules/vesicular appearance on abdomen, appears possibly consistent with shingles seen today on abdomen (story doesn't fit perfectly with shingles as she's been in and out of ED for about 1 month at this time and rash isn't particularly impressive, would think she'd have rash sooner, but will treat for this) b. Start valcyclovir, gabapentin, steroids, follow c. HIDA negative, upper GI with small sliding hiatal hernia, chest CT with areas of interstitial thickening and minimal nodularity, infectious vs inflammatory  d. CT abdomen without acute abnormality - RUQ [place] with small gallbladder polyp e. ? Thoracic radiculopathy, MRI T spine negative f. GI c/s, appreciate recs - recommending PPI, follow up with PCP or [facility] for GERD in -6 weeks, consider lidocaine patch - no recommendation for inpatient EGD at this time - repeat UA g. Surgery c/s, appreciate recs h. GI path panel d/c'd per GI i. Continue PPI BID  2. Anxiety  Chronic Pain Syndrome  History of Polysubstance Abuse Vicki Moore. TSH, wnl b. Ativan PRN c. Hx of polysubstance abuse per care everywhere (will need to discuss additionally with pt), but recently she's been  seeing [name] for chronic pain and getting buprenorphine, percocet, methylphenidate, and lyrica from him.  Apparently Dr. Tollie Eth is no longer going to be practicing and she'll have to find someone else for chronic pain.  d. No more IV pain meds, prioritize oral and non opiate pain meds e. Consider starting SSRI at discharge if she is tolerating PO  3. Community Acquired Pneumonia:  1. CT with  interstitial thickening and nodularity in upper lobes, will treat with abx and will need follow up imaging outpatient  4. Hair Loss  Joint Pain:  Follow ANA (negative), RF (negative), anti CCP, sed rate (mildly elevated), CRP (negative).   5. Weight Gain  Swelling: pt describes lower extremity swelling and facial swelling on/off over past several months.  She was on steroids as an outpatient, but no longer taking this.  UA negative for protein.  Follow BNP (wnl).    1. Negative dex suppression 2. Follow up additionally as outpatient   6. GERD Jacee Enerson. Increase PPI BID  4. Asthma Alajia Schmelzer. Continue home inhalers  DVT prophylaxis: SCD Code Status: full  Family Communication: none Disposition:   Status is: inpatient  The patient will require care spanning > 2 midnights and should be moved to inpatient because: Inpatient level of care appropriate due to severity of illness  Dispo: The patient is from: Home              Anticipated d/c is to: Home              Anticipated d/c date is: 1 day              Patient currently is not medically stable to d/c.  Consultants:   GI  Surgery  Procedures:   none  Antimicrobials:  Anti-infectives (From admission, onward)   Start     Dose/Rate Route Frequency Ordered Stop   11/09/19 1615  valACYclovir (VALTREX) tablet 1,000 mg     Discontinue     1,000 mg Oral 3 times daily 11/09/19 1602     11/09/19 1600  levofloxacin (LEVAQUIN) IVPB 750 mg     Discontinue     750 mg 100 mL/hr over 90 Minutes Intravenous Daily 11/09/19 1509 11/14/19 1559     Subjective: Continues to feel poorly, asking about MRI   Objective: Vitals:   11/11/19 0622 11/11/19 0747 11/11/19 1422 11/11/19 1505  BP: 108/67  (!) 140/76 (!) 106/56  Pulse: 93  (!) 127 (!) 122  Resp: 20     Temp: 98.9 F (37.2 C)  98.1 F (36.7 C)   TempSrc:   Oral   SpO2: 95% 97% 92% 94%    Intake/Output Summary (Last 24 hours) at 11/11/2019 1550 Last data filed at 11/10/2019 1800 Gross  per 24 hour  Intake 150 ml  Output 200 ml  Net -50 ml   There were no vitals filed for this visit.  Examination:  General: No acute distress. Cardiovascular: Heart sounds show Vicki Moore regular rate, and rhythm.  TTP along R side Lungs: Clear to auscultation bilaterally  Abdomen: Soft, nontender, nondistended . Neurological: Alert and oriented 3. Moves all extremities 4 h. Cranial nerves II through XII grossly intact. Skin: Warm and dry. No rashes or lesions. Extremities: No clubbing or cyanosis. No edema.  Data Reviewed: I have personally reviewed following labs and imaging studies  CBC: Recent Labs  Lab 11/05/19 2241 11/05/19 2241 11/07/19 1034 11/08/19 0523 11/09/19 0517 11/10/19 0336 11/11/19 0346  WBC 12.5*   < >  10.7* 7.1 6.9 6.5 8.5  NEUTROABS 11.2*  --   --   --  4.1 5.2 6.2  HGB 11.3*   < > 10.2* 10.1* 9.9* 10.5* 10.5*  HCT 36.3   < > 33.6* 35.1* 31.8* 35.3* 35.0*  MCV 79.4*   < > 80.4 84.8 77.0* 79.7* 80.6  PLT 312   < > 274 256 236 261 270   < > = values in this interval not displayed.    Basic Metabolic Panel: Recent Labs  Lab 11/07/19 1034 11/08/19 0523 11/09/19 0517 11/10/19 0336 11/11/19 0346  NA 141 137 138 135 137  K 3.5 3.9 4.5 4.4 3.5  CL 106 104 105 101 104  CO2 24 22 24 23  20*  GLUCOSE 68* 72 75 84 91  BUN 7 8 7 7 14   CREATININE 0.59 0.61 0.60 0.58 0.62  CALCIUM 8.4* 8.5* 8.9 9.1 9.2  MG  --   --  1.8 1.9 1.9  PHOS  --   --  3.2 3.9 4.3    GFR: Estimated Creatinine Clearance: 97.2 mL/min (by C-G formula based on SCr of 0.62 mg/dL).  Liver Function Tests: Recent Labs  Lab 11/07/19 1034 11/08/19 0523 11/09/19 0517 11/10/19 0336 11/11/19 0346  AST 14* 12* 15 14* 10*  ALT 12 12 11 14 10   ALKPHOS 48 48 48 54 43  BILITOT 0.2* 0.7 0.9 0.7 0.7  PROT 6.7 6.1* 6.0* 7.0 6.3*  ALBUMIN 3.7 3.5 3.4* 4.0 3.5    CBG: No results for input(s): GLUCAP in the last 168 hours.   Recent Results (from the past 240 hour(s))  SARS Coronavirus 2  by RT PCR (hospital order, performed in [facility] hospital lab) Nasopharyngeal Nasopharyngeal Swab     Status: None   Collection Time: 11/07/19  3:06 PM   Specimen: Nasopharyngeal Swab  Result Value Ref Range Status   SARS Coronavirus 2 NEGATIVE NEGATIVE Final    Comment: (NOTE) SARS-CoV-2 target nucleic acids are NOT DETECTED.  The SARS-CoV-2 RNA is generally detectable in upper and lower respiratory specimens during the acute phase of infection. The lowest concentration of SARS-CoV-2 viral copies this assay can detect is 250 copies / mL. Vicki Moore negative result does not preclude SARS-CoV-2 infection and should not be used as the sole basis for treatment or other patient management decisions.  Vicki Moore negative result may occur with improper specimen collection / handling, submission of specimen other than nasopharyngeal swab, presence of viral mutation(s) within the areas targeted by this assay, and inadequate number of viral copies (<250 copies / mL). Vicki Moore negative result must be combined with clinical observations, patient history, and epidemiological information.  Fact Sheet for Patients:   BoilerBrush.com.cyhttps://www.fda.gov/media/136312/download  Fact Sheet for Healthcare Providers: https://pope.com/https://www.fda.gov/media/136313/download  This test is not yet approved or  cleared by the [place] FDA and has been authorized for detection and/or diagnosis of SARS-CoV-2 by FDA under an Emergency Use Authorization (EUA).  This EUA will remain in effect (meaning this test can be used) for the duration of the COVID-19 declaration under Section 564(b)(1) of the Act, 21 U.S.C. section 360bbb-3(b)(1), unless the authorization is terminated or revoked sooner.  Performed at [facility], [address] [place], [place] [id number]   Surgical pcr screen     Status: Abnormal   Collection Time: 11/07/19  9:04 PM   Specimen: Nasal Mucosa; Nasal Swab  Result Value Ref Range Status   MRSA, PCR NEGATIVE  NEGATIVE Final   Staphylococcus aureus POSITIVE (  Norlan Rann) NEGATIVE Final    Comment: (NOTE) The Xpert SA Assay (FDA approved for NASAL specimens in patients 58 years of age and older), is one component of Rashod Gougeon comprehensive surveillance program. It is not intended to diagnose infection nor to guide or monitor treatment. Performed at [facility], [address], [place], [place] [address]          Radiology Studies: MR THORACIC SPINE WO CONTRAST  Result Date: 11/11/2019 CLINICAL DATA:  Back pain and right radicular pain, approximately T6 by clinical exam. EXAM: MRI THORACIC SPINE WITHOUT CONTRAST TECHNIQUE: Multiplanar, multisequence MR imaging of the thoracic spine was performed. No intravenous contrast was administered. COMPARISON:  CT 11/09/2019 FINDINGS: Alignment:  Normal Vertebrae: Normal.  No fracture or focal lesion. Cord:  Normal.  No cord compression or focal lesion. Paraspinal and other soft tissues: Normal Disc levels: Thoracic intervertebral discs are normal. No degeneration, bulge or herniation. No stenosis of the canal or foramina. No facet arthropathy. IMPRESSION: Normal thoracic MRI. No abnormality seen to explain right T6 region discomfort. Electronically Signed   By: Paulina Fusi M.D.   On: 11/11/2019 13:11        Scheduled Meds: . acetaminophen  1,000 mg Oral Q8H  . albuterol  2.5 mg Nebulization BID  . diclofenac Sodium  2 g Topical QID  . ferrous sulfate  325 mg Oral Q breakfast  . fluticasone furoate-vilanterol  1 puff Inhalation Daily  . gabapentin  300 mg Oral TID  . lidocaine  1 patch Transdermal Q24H  . nicotine  14 mg Transdermal Daily  . pantoprazole (PROTONIX) IV  40 mg Intravenous Q12H  . predniSONE  60 mg Oral Q breakfast  . sodium chloride flush  10-40 mL Intracatheter Q12H  . traZODone  100 mg Oral QHS  . valACYclovir  1,000 mg Oral TID   Continuous Infusions: . levofloxacin (LEVAQUIN) IV 750 mg (11/10/19 1602)     LOS: 3 days      Time spent: over 30 min    Lacretia Nicks, MD [facility]   To contact the attending provider between 7A-7P or the covering provider during after hours 7P-7A, please log into the web site [email or web address] and access using universal [place] password for that web site. If you do not have the password, please call the hospital operator.  11/11/2019, 3:50 PM

## 2019-11-11 NOTE — Progress Notes (Signed)
Pt has returned from MRI. 

## 2019-11-11 NOTE — Progress Notes (Addendum)
This Clinical research associate was with patient getting routine vitals while providing care. Patient's HR was elevated, BP140/96, RR 23, 94%RA, T98.6.  charge nurse notified, MD aware. MD stated he was going to see patient. Patient was provided with pain medication and scheduled medications. Relaxation breathing was encouraged. Patient had been increasingly worried over her condition. This Clinical research associate provided emotional reassurance and support. Will continue to monitor.

## 2019-11-11 NOTE — Final Progress Note (Signed)
This writer was providing care and giving scheduled medications. Patient continues to complain of abdominal pain and cramping. Patient was given PRN medication for relief. Charge nurse aware. MD aware. This Clinical research associate will continue to monitor.

## 2019-11-12 ENCOUNTER — Other Ambulatory Visit: Payer: Self-pay

## 2019-11-12 ENCOUNTER — Inpatient Hospital Stay (HOSPITAL_COMMUNITY): Payer: Medicaid Other

## 2019-11-12 DIAGNOSIS — R079 Chest pain, unspecified: Secondary | ICD-10-CM

## 2019-11-12 LAB — COMPREHENSIVE METABOLIC PANEL
ALT: 10 U/L (ref 0–44)
AST: 11 U/L — ABNORMAL LOW (ref 15–41)
Albumin: 3.3 g/dL — ABNORMAL LOW (ref 3.5–5.0)
Alkaline Phosphatase: 39 U/L (ref 38–126)
Anion gap: 6 (ref 5–15)
BUN: 21 mg/dL — ABNORMAL HIGH (ref 6–20)
CO2: 22 mmol/L (ref 22–32)
Calcium: 8.7 mg/dL — ABNORMAL LOW (ref 8.9–10.3)
Chloride: 113 mmol/L — ABNORMAL HIGH (ref 98–111)
Creatinine, Ser: 0.59 mg/dL (ref 0.44–1.00)
GFR calc Af Amer: >60 mL/min (ref >60)
GFR calc non Af Amer: >60 mL/min (ref >60)
Glucose, Bld: 109 mg/dL — ABNORMAL HIGH (ref 70–99)
Potassium: 3.3 mmol/L — ABNORMAL LOW (ref 3.5–5.1)
Sodium: 141 mmol/L (ref 135–145)
Total Bilirubin: 0.4 mg/dL (ref 0.3–1.2)
Total Protein: 5.8 g/dL — ABNORMAL LOW (ref 6.5–8.1)

## 2019-11-12 LAB — ECHOCARDIOGRAM COMPLETE
Area-P 1/2: 6.96 cm2
Calc EF: 61.9 %
Height: 61 in
S' Lateral: 2.78 cm
Single Plane A2C EF: 60.5 %
Single Plane A4C EF: 60.5 %
Weight: 3167.99 oz

## 2019-11-12 LAB — CBC WITH DIFFERENTIAL/PLATELET
Abs Immature Granulocytes: 0.04 10*3/uL (ref 0.00–0.07)
Basophils Absolute: 0 10*3/uL (ref 0.0–0.1)
Basophils Relative: 0 %
Eosinophils Absolute: 0.1 10*3/uL (ref 0.0–0.5)
Eosinophils Relative: 1 %
HCT: 30.9 % — ABNORMAL LOW (ref 36.0–46.0)
Hemoglobin: 9.3 g/dL — ABNORMAL LOW (ref 12.0–15.0)
Immature Granulocytes: 0 %
Lymphocytes Relative: 18 %
Lymphs Abs: 1.6 10*3/uL (ref 0.7–4.0)
MCH: 24.5 pg — ABNORMAL LOW (ref 26.0–34.0)
MCHC: 30.1 g/dL (ref 30.0–36.0)
MCV: 81.3 fL (ref 80.0–100.0)
Monocytes Absolute: 0.8 10*3/uL (ref 0.1–1.0)
Monocytes Relative: 9 %
Neutro Abs: 6.5 10*3/uL (ref 1.7–7.7)
Neutrophils Relative %: 72 %
Platelets: 259 10*3/uL (ref 150–400)
RBC: 3.8 MIL/uL — ABNORMAL LOW (ref 3.87–5.11)
RDW: 17.2 % — ABNORMAL HIGH (ref 11.5–15.5)
WBC: 9 10*3/uL (ref 4.0–10.5)
nRBC: 0 % (ref 0.0–0.2)

## 2019-11-12 LAB — CYCLIC CITRUL PEPTIDE ANTIBODY, IGG/IGA: CCP Antibodies IgG/IgA: 3 units (ref 0–19)

## 2019-11-12 LAB — PHOSPHORUS: Phosphorus: 2.9 mg/dL (ref 2.5–4.6)

## 2019-11-12 LAB — TROPONIN I (HIGH SENSITIVITY)
Troponin I (High Sensitivity): <2 ng/L (ref <18)
Troponin I (High Sensitivity): <2 ng/L (ref <18)

## 2019-11-12 LAB — MAGNESIUM: Magnesium: 1.7 mg/dL (ref 1.7–2.4)

## 2019-11-12 MED ORDER — PREDNISONE 20 MG PO TABS: 20.0000 mg | ORAL_TABLET | Freq: Every day | ORAL | Status: DC

## 2019-11-12 MED ORDER — OXYCODONE HCL 5 MG PO TABS
5.0000 mg | ORAL_TABLET | Freq: Four times a day (QID) | ORAL | Status: DC | PRN
  Administered 2019-11-12 – 2019-11-15 (×6): 5 mg via ORAL
  Filled 2019-11-12 (×9): qty 1

## 2019-11-12 MED ORDER — POTASSIUM CHLORIDE CRYS ER 20 MEQ PO TBCR
40.0000 meq | EXTENDED_RELEASE_TABLET | Freq: Once | ORAL | Status: AC
  Administered 2019-11-12: 40 meq via ORAL
  Filled 2019-11-12: qty 2

## 2019-11-12 MED ORDER — PREDNISONE 20 MG PO TABS: 40.0000 mg | ORAL_TABLET | Freq: Every day | ORAL | Status: DC

## 2019-11-12 MED ORDER — PREDNISONE 10 MG PO TABS: 10.0000 mg | ORAL_TABLET | Freq: Every day | ORAL | Status: DC

## 2019-11-12 MED ORDER — PREDNISONE 20 MG PO TABS
40.0000 mg | ORAL_TABLET | Freq: Every day | ORAL | Status: DC
  Administered 2019-11-14: 40 mg via ORAL
  Filled 2019-11-12: qty 2

## 2019-11-12 MED ORDER — MORPHINE SULFATE (PF) 2 MG/ML IV SOLN
1.0000 mg | Freq: Once | INTRAVENOUS | Status: AC
  Administered 2019-11-12: 1 mg via INTRAVENOUS
  Filled 2019-11-12: qty 1

## 2019-11-12 MED ORDER — ENOXAPARIN SODIUM 40 MG/0.4ML ~~LOC~~ SOLN
40.0000 mg | SUBCUTANEOUS | Status: DC
  Administered 2019-11-13 – 2019-11-14 (×2): 40 mg via SUBCUTANEOUS
  Filled 2019-11-12 (×4): qty 0.4

## 2019-11-12 MED ORDER — LORAZEPAM 2 MG/ML IJ SOLN
0.2500 mg | Freq: Four times a day (QID) | INTRAMUSCULAR | Status: DC | PRN
  Administered 2019-11-12 – 2019-11-14 (×5): 0.25 mg via INTRAVENOUS
  Filled 2019-11-12 (×7): qty 1

## 2019-11-12 MED ORDER — PREDNISONE 20 MG PO TABS
60.0000 mg | ORAL_TABLET | Freq: Every day | ORAL | Status: AC
  Filled 2019-11-12 (×3): qty 3

## 2019-11-12 NOTE — Progress Notes (Signed)
Pt called out w/ c/o N/V and pain. RN entered room to treat N/V first. Pt states Oxy IR and Toradol are no help for pts pain 10/10 to right abd radiating to right shoulder. Pt request RN page on call MD for further pain management. RN awaiting response.

## 2019-11-12 NOTE — Progress Notes (Signed)
   11/11/19 2024  Assess: MEWS Score  Temp 97.8 F (36.6 C)  BP (!) 91/54  Pulse Rate (!) 124  Resp 20  SpO2 96 %  Assess: MEWS Score  MEWS Temp 0  MEWS Systolic 1  MEWS Pulse 2  MEWS RR 0  MEWS LOC 0  MEWS Score 3  MEWS Score Color Yellow  Assess: if the MEWS score is Yellow or Red  Were vital signs taken at a resting state? Yes  Focused Assessment No change from prior assessment  Early Detection of Sepsis Score *See Row Information* Low  MEWS guidelines implemented *See Row Information* No, previously yellow, continue vital signs every 4 hours  Treat  MEWS Interventions Administered scheduled meds/treatments;Administered prn meds/treatments  Pain Score 8  Pain Type Acute pain  Pain Location Generalized  Pain Orientation Right;Left;Anterior;Posterior  Pain Descriptors / Indicators Aching  Pain Frequency Constant  Pain Onset On-going  Patients Stated Pain Goal 2  Pain Intervention(s)  (will medicate when meds are due. )  Take Vital Signs  Increase Vital Sign Frequency   (continue q4; patient has been yellow today. )  Escalate  MEWS: Escalate Yellow: discuss with charge nurse/RN and consider discussing with provider and RRT  Notify: Charge Nurse/RN  Name of Charge Nurse/RN Notified Vera, RN  Date Charge Nurse/RN Notified 11/11/19  Time Charge Nurse/RN Notified 1900  Notify: Provider  Provider Name/Title  (MD notified by day RN, nothing has changed. )  Notify: Rapid Response  Name of Rapid Response RN Notified  (n/a; pt. still the same since MD notified. )  Document  Patient Outcome Stabilized after interventions;Other (Comment) (Pt. stable. HR remains elevated, but MD aware. PRN pain meds)  Progress note created (see row info) Yes

## 2019-11-12 NOTE — Progress Notes (Signed)
Patient is back in green mews. Heart rate is 107. Charge nurse aware. MD aware.
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
## 2019-11-12 NOTE — Progress Notes (Signed)
Pt pain unrelieved. PRN given, see MAR. Pt also had episode of emesis, less than 100 mL and Meds given earlier were seen in emesis. Will continue to monitor pt.

## 2019-11-12 NOTE — Progress Notes (Signed)
Patient's heart rate elevated once more, in yellow mews. Vitals were taken at resting state. Triponins, EKG and Echo were ordered. Patient is alert. PRN medications will be provided for discomfort. Emotional support provided to patient by this Clinical research associate. Charge nurse aware. MD aware. Will continue to monitor.

## 2019-11-12 NOTE — Progress Notes (Signed)
  Echocardiogram 2D Echocardiogram has been performed.  Vicki Moore 11/12/2019, 3:46 PM

## 2019-11-13 ENCOUNTER — Inpatient Hospital Stay (HOSPITAL_COMMUNITY): Payer: Medicaid Other

## 2019-11-13 LAB — CBC WITH DIFFERENTIAL/PLATELET
Abs Immature Granulocytes: 0.04 10*3/uL (ref 0.00–0.07)
Basophils Absolute: 0 10*3/uL (ref 0.0–0.1)
Basophils Relative: 0 %
Eosinophils Absolute: 0 10*3/uL (ref 0.0–0.5)
Eosinophils Relative: 0 %
HCT: 31.3 % — ABNORMAL LOW (ref 36.0–46.0)
Hemoglobin: 9.4 g/dL — ABNORMAL LOW (ref 12.0–15.0)
Immature Granulocytes: 0 %
Lymphocytes Relative: 17 %
Lymphs Abs: 1.7 10*3/uL (ref 0.7–4.0)
MCH: 24.2 pg — ABNORMAL LOW (ref 26.0–34.0)
MCHC: 30 g/dL (ref 30.0–36.0)
MCV: 80.7 fL (ref 80.0–100.0)
Monocytes Absolute: 0.9 10*3/uL (ref 0.1–1.0)
Monocytes Relative: 9 %
Neutro Abs: 7.3 10*3/uL (ref 1.7–7.7)
Neutrophils Relative %: 74 %
Platelets: 259 10*3/uL (ref 150–400)
RBC: 3.88 MIL/uL (ref 3.87–5.11)
RDW: 17.7 % — ABNORMAL HIGH (ref 11.5–15.5)
WBC: 10 10*3/uL (ref 4.0–10.5)
nRBC: 0 % (ref 0.0–0.2)

## 2019-11-13 LAB — COMPREHENSIVE METABOLIC PANEL
ALT: 10 U/L (ref 0–44)
AST: 9 U/L — ABNORMAL LOW (ref 15–41)
Albumin: 3.4 g/dL — ABNORMAL LOW (ref 3.5–5.0)
Alkaline Phosphatase: 33 U/L — ABNORMAL LOW (ref 38–126)
Anion gap: 10 (ref 5–15)
BUN: 17 mg/dL (ref 6–20)
CO2: 23 mmol/L (ref 22–32)
Calcium: 8.8 mg/dL — ABNORMAL LOW (ref 8.9–10.3)
Chloride: 108 mmol/L (ref 98–111)
Creatinine, Ser: 0.62 mg/dL (ref 0.44–1.00)
GFR calc Af Amer: >60 mL/min (ref >60)
GFR calc non Af Amer: >60 mL/min (ref >60)
Glucose, Bld: 89 mg/dL (ref 70–99)
Potassium: 3.7 mmol/L (ref 3.5–5.1)
Sodium: 141 mmol/L (ref 135–145)
Total Bilirubin: 0.4 mg/dL (ref 0.3–1.2)
Total Protein: 5.8 g/dL — ABNORMAL LOW (ref 6.5–8.1)

## 2019-11-13 LAB — HEMOGLOBIN A1C
Hgb A1c MFr Bld: 5.2 % (ref 4.8–5.6)
Mean Plasma Glucose: 102.54 mg/dL

## 2019-11-13 LAB — MAGNESIUM: Magnesium: 1.8 mg/dL (ref 1.7–2.4)

## 2019-11-13 LAB — PHOSPHORUS: Phosphorus: 2.9 mg/dL (ref 2.5–4.6)

## 2019-11-13 LAB — CK: Total CK: 26 U/L — ABNORMAL LOW (ref 38–234)

## 2019-11-13 MED ORDER — PROMETHAZINE HCL 25 MG RE SUPP
25.0000 mg | Freq: Four times a day (QID) | RECTAL | Status: DC | PRN
  Filled 2019-11-13: qty 1

## 2019-11-13 MED ORDER — METOCLOPRAMIDE HCL 5 MG/ML IJ SOLN: 5.0000 mg | Freq: Four times a day (QID) | INTRAMUSCULAR | Status: DC | PRN

## 2019-11-13 MED ORDER — KCL-LACTATED RINGERS-D5W 20 MEQ/L IV SOLN
INTRAVENOUS | Status: AC
  Filled 2019-11-13 (×2): qty 1000

## 2019-11-13 MED ORDER — HYDROMORPHONE HCL 1 MG/ML IJ SOLN
1.0000 mg | Freq: Once | INTRAMUSCULAR | Status: AC
  Administered 2019-11-13: 1 mg via INTRAVENOUS
  Filled 2019-11-13: qty 1

## 2019-11-13 MED ORDER — PROMETHAZINE HCL 25 MG PO TABS: 12.5000 mg | ORAL_TABLET | Freq: Four times a day (QID) | ORAL | Status: DC

## 2019-11-13 MED ORDER — MORPHINE SULFATE (PF) 2 MG/ML IV SOLN
1.0000 mg | Freq: Once | INTRAVENOUS | Status: AC
  Administered 2019-11-13: 1 mg via INTRAVENOUS
  Filled 2019-11-13: qty 1

## 2019-11-13 MED ORDER — METOCLOPRAMIDE HCL 5 MG/ML IJ SOLN
10.0000 mg | Freq: Four times a day (QID) | INTRAMUSCULAR | Status: DC | PRN
  Administered 2019-11-13 – 2019-11-14 (×3): 10 mg via INTRAVENOUS
  Filled 2019-11-13 (×3): qty 2

## 2019-11-13 MED ORDER — PROMETHAZINE HCL 25 MG PO TABS: 12.5000 mg | ORAL_TABLET | Freq: Four times a day (QID) | ORAL | Status: DC | PRN

## 2019-11-13 MED ORDER — DULOXETINE HCL 30 MG PO CPEP
30.0000 mg | ORAL_CAPSULE | Freq: Every day | ORAL | Status: DC
  Administered 2019-11-14: 30 mg via ORAL
  Filled 2019-11-13: qty 1

## 2019-11-13 MED ORDER — PROMETHAZINE HCL 25 MG PO TABS
25.0000 mg | ORAL_TABLET | Freq: Four times a day (QID) | ORAL | Status: DC | PRN
  Administered 2019-11-14: 25 mg via ORAL
  Filled 2019-11-13: qty 1

## 2019-11-13 MED ORDER — PROMETHAZINE HCL 25 MG PO TABS
25.0000 mg | ORAL_TABLET | Freq: Four times a day (QID) | ORAL | Status: DC
  Administered 2019-11-13: 25 mg via ORAL

## 2019-11-13 MED ORDER — MAGNESIUM SULFATE IN D5W 1-5 GM/100ML-% IV SOLN
1.0000 g | Freq: Once | INTRAVENOUS | Status: AC
  Administered 2019-11-13: 1 g via INTRAVENOUS
  Filled 2019-11-13: qty 100

## 2019-11-13 NOTE — Progress Notes (Signed)
[facility] Gastroenterology Progress Note  Vicki Moore 39 y.o. Aug 06, 1980   Subjective: Recurrent vomiting. Complaining of abdominal pain. Nurse present during my evaluation.  Objective: Vital signs: Vitals:   11/13/19 0636 11/13/19 1136  BP: (!) 133/83 (!) 146/88  Pulse: 99 82  Resp: 20   Temp: 98.1 F (36.7 C) 98.6 F (37 C)  SpO2: 98% 99%    Physical Exam: Gen: alert, +acute distress, obese  HEENT: anicteric sclera CV: RRR Chest: CTA B Abd: diffuse tenderness with guarding, soft, nondistended, +BS Ext: no edema  Lab Results: Recent Labs    11/12/19 0400 11/13/19 0350  NA 141 141  K 3.3* 3.7  CL 113* 108  CO2 22 23  GLUCOSE 109* 89  BUN 21* 17  CREATININE 0.59 0.62  CALCIUM 8.7* 8.8*  MG 1.7 1.8  PHOS 2.9 2.9   Recent Labs    11/12/19 0400 11/13/19 0350  AST 11* 9*  ALT 10 10  ALKPHOS 39 33*  BILITOT 0.4 0.4  PROT 5.8* 5.8*  ALBUMIN 3.3* 3.4*   Recent Labs    11/12/19 0400 11/13/19 0350  WBC 9.0 10.0  NEUTROABS 6.5 7.3  HGB 9.3* 9.4*  HCT 30.9* 31.3*  MCV 81.3 80.7  PLT 259 259      Assessment/Plan: Intractable nausea/vomiting with negative UGIS last week. Doubt peptic ulcer disease or gastric outlet obstruction. On airborne precautions for shingles. Diet changed to NPO. I do not think an EGD is needed. Gastroparesis is possible but difficult to diagnose while on pain meds and could consider a gastric emptying scan when off of pain meds at least 48-72 hours. Patient reports that Zofran not helping. Recommend to change to Phenergan but defer dosing to Dr. Lowell Guitar. If no improvement, then add Reglan. Supportive care. D/W Dr. Lowell Guitar. Call us back if needed. Will sign off.   Vicki Moore 11/13/2019, 11:39 AM  Questions please call [phone] ID: Vicki Moore, female   DOB: Dec 20, 1980, 39 y.o.   MRN: [id number]
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
## 2019-11-13 NOTE — Progress Notes (Addendum)
While giving patient a antiemetic, patient went into Vtach 10 beats, she was asymptomatic. MD aware. No new orders given.

## 2019-11-13 NOTE — Progress Notes (Signed)
Patient currently refusing any medications by mouth, including pain medications. Patient is persistent that she cannot take anything by mouth at this time. Will continue to monitor patient at this time.  PRN nausea medications and anxiety medications were provided to help. Will continue to monitor.

## 2019-11-14 LAB — COMPREHENSIVE METABOLIC PANEL
ALT: 13 U/L (ref 0–44)
AST: 18 U/L (ref 15–41)
Albumin: 3.8 g/dL (ref 3.5–5.0)
Alkaline Phosphatase: 42 U/L (ref 38–126)
Anion gap: 10 (ref 5–15)
BUN: 7 mg/dL (ref 6–20)
CO2: 23 mmol/L (ref 22–32)
Calcium: 8.8 mg/dL — ABNORMAL LOW (ref 8.9–10.3)
Chloride: 103 mmol/L (ref 98–111)
Creatinine, Ser: 0.65 mg/dL (ref 0.44–1.00)
GFR calc Af Amer: >60 mL/min (ref >60)
GFR calc non Af Amer: >60 mL/min (ref >60)
Glucose, Bld: 103 mg/dL — ABNORMAL HIGH (ref 70–99)
Potassium: 3.8 mmol/L (ref 3.5–5.1)
Sodium: 136 mmol/L (ref 135–145)
Total Bilirubin: 0.4 mg/dL (ref 0.3–1.2)
Total Protein: 6.3 g/dL — ABNORMAL LOW (ref 6.5–8.1)

## 2019-11-14 LAB — CBC WITH DIFFERENTIAL/PLATELET
Abs Immature Granulocytes: 0.05 10*3/uL (ref 0.00–0.07)
Basophils Absolute: 0 10*3/uL (ref 0.0–0.1)
Basophils Relative: 0 %
Eosinophils Absolute: 0.2 10*3/uL (ref 0.0–0.5)
Eosinophils Relative: 3 %
HCT: 32.1 % — ABNORMAL LOW (ref 36.0–46.0)
Hemoglobin: 10 g/dL — ABNORMAL LOW (ref 12.0–15.0)
Immature Granulocytes: 1 %
Lymphocytes Relative: 30 %
Lymphs Abs: 1.8 10*3/uL (ref 0.7–4.0)
MCH: 24 pg — ABNORMAL LOW (ref 26.0–34.0)
MCHC: 31.2 g/dL (ref 30.0–36.0)
MCV: 77.2 fL — ABNORMAL LOW (ref 80.0–100.0)
Monocytes Absolute: 0.6 10*3/uL (ref 0.1–1.0)
Monocytes Relative: 9 %
Neutro Abs: 3.4 10*3/uL (ref 1.7–7.7)
Neutrophils Relative %: 57 %
Platelets: 249 10*3/uL (ref 150–400)
RBC: 4.16 MIL/uL (ref 3.87–5.11)
RDW: 18 % — ABNORMAL HIGH (ref 11.5–15.5)
WBC: 6 10*3/uL (ref 4.0–10.5)
nRBC: 0 % (ref 0.0–0.2)

## 2019-11-14 LAB — MAGNESIUM: Magnesium: 2 mg/dL (ref 1.7–2.4)

## 2019-11-14 LAB — PHOSPHORUS: Phosphorus: 3.2 mg/dL (ref 2.5–4.6)

## 2019-11-14 MED ORDER — CLONAZEPAM 0.125 MG PO TBDP
0.2500 mg | ORAL_TABLET | Freq: Two times a day (BID) | ORAL | Status: DC | PRN
  Administered 2019-11-14 – 2019-11-15 (×2): 0.25 mg via ORAL
  Filled 2019-11-14 (×2): qty 2

## 2019-11-14 MED ORDER — PROMETHAZINE HCL 25 MG RE SUPP: 25.0000 mg | Freq: Four times a day (QID) | RECTAL | Status: DC

## 2019-11-14 MED ORDER — LORAZEPAM 0.5 MG PO TABS: 0.2500 mg | ORAL_TABLET | Freq: Two times a day (BID) | ORAL | Status: DC | PRN

## 2019-11-14 MED ORDER — POTASSIUM CHLORIDE 20 MEQ PO PACK
40.0000 meq | PACK | Freq: Once | ORAL | Status: AC
  Administered 2019-11-14: 40 meq via ORAL
  Filled 2019-11-14: qty 2

## 2019-11-14 MED ORDER — LORAZEPAM 2 MG/ML IJ SOLN: 0.2500 mg | Freq: Two times a day (BID) | INTRAMUSCULAR | Status: DC | PRN

## 2019-11-14 MED ORDER — PROMETHAZINE HCL 25 MG PO TABS
25.0000 mg | ORAL_TABLET | Freq: Four times a day (QID) | ORAL | Status: DC
  Administered 2019-11-14 – 2019-11-15 (×4): 25 mg via ORAL
  Filled 2019-11-14 (×4): qty 1

## 2019-11-14 NOTE — Progress Notes (Signed)
Airborne precautions were D/C per on-call attending, negative for shingles. Will continue to monitor the patient.
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
## 2019-11-15 LAB — COMPREHENSIVE METABOLIC PANEL
ALT: 15 U/L (ref 0–44)
AST: 14 U/L — ABNORMAL LOW (ref 15–41)
Albumin: 4 g/dL (ref 3.5–5.0)
Alkaline Phosphatase: 44 U/L (ref 38–126)
Anion gap: 10 (ref 5–15)
BUN: 9 mg/dL (ref 6–20)
CO2: 22 mmol/L (ref 22–32)
Calcium: 9.2 mg/dL (ref 8.9–10.3)
Chloride: 104 mmol/L (ref 98–111)
Creatinine, Ser: 0.8 mg/dL (ref 0.44–1.00)
GFR calc Af Amer: >60 mL/min (ref >60)
GFR calc non Af Amer: >60 mL/min (ref >60)
Glucose, Bld: 107 mg/dL — ABNORMAL HIGH (ref 70–99)
Potassium: 3.6 mmol/L (ref 3.5–5.1)
Sodium: 136 mmol/L (ref 135–145)
Total Bilirubin: 0.7 mg/dL (ref 0.3–1.2)
Total Protein: 6.6 g/dL (ref 6.5–8.1)

## 2019-11-15 LAB — CBC WITH DIFFERENTIAL/PLATELET
Abs Immature Granulocytes: 0.02 10*3/uL (ref 0.00–0.07)
Basophils Absolute: 0 10*3/uL (ref 0.0–0.1)
Basophils Relative: 0 %
Eosinophils Absolute: 0.2 10*3/uL (ref 0.0–0.5)
Eosinophils Relative: 3 %
HCT: 35.9 % — ABNORMAL LOW (ref 36.0–46.0)
Hemoglobin: 10.9 g/dL — ABNORMAL LOW (ref 12.0–15.0)
Immature Granulocytes: 0 %
Lymphocytes Relative: 31 %
Lymphs Abs: 2 10*3/uL (ref 0.7–4.0)
MCH: 24.2 pg — ABNORMAL LOW (ref 26.0–34.0)
MCHC: 30.4 g/dL (ref 30.0–36.0)
MCV: 79.6 fL — ABNORMAL LOW (ref 80.0–100.0)
Monocytes Absolute: 0.6 10*3/uL (ref 0.1–1.0)
Monocytes Relative: 10 %
Neutro Abs: 3.6 10*3/uL (ref 1.7–7.7)
Neutrophils Relative %: 56 %
Platelets: 241 10*3/uL (ref 150–400)
RBC: 4.51 MIL/uL (ref 3.87–5.11)
RDW: 18.2 % — ABNORMAL HIGH (ref 11.5–15.5)
WBC: 6.4 10*3/uL (ref 4.0–10.5)
nRBC: 0 % (ref 0.0–0.2)

## 2019-11-15 LAB — MAGNESIUM: Magnesium: 1.9 mg/dL (ref 1.7–2.4)

## 2019-11-15 LAB — PHOSPHORUS: Phosphorus: 4.4 mg/dL (ref 2.5–4.6)

## 2019-11-15 MED ORDER — PANTOPRAZOLE SODIUM 40 MG PO TBEC
40.0000 mg | DELAYED_RELEASE_TABLET | Freq: Two times a day (BID) | ORAL | 0 refills | Status: AC
Start: 2019-11-15 — End: ?

## 2019-11-15 MED ORDER — POLYETHYLENE GLYCOL 3350 17 G PO PACK: 17.0000 g | PACK | Freq: Every day | ORAL | Status: DC | PRN

## 2019-11-15 MED ORDER — POLYETHYLENE GLYCOL 3350 17 G PO PACK: 17.0000 g | PACK | Freq: Every day | ORAL | 0 refills | Status: DC | PRN

## 2019-11-15 MED ORDER — VALACYCLOVIR HCL 1 G PO TABS: 1000.0000 mg | ORAL_TABLET | Freq: Three times a day (TID) | ORAL | 0 refills | Status: AC

## 2019-11-15 MED ORDER — SENNOSIDES-DOCUSATE SODIUM 8.6-50 MG PO TABS: 2.0000 | ORAL_TABLET | Freq: Every evening | ORAL | 0 refills | Status: DC | PRN

## 2019-11-15 MED ORDER — SENNOSIDES-DOCUSATE SODIUM 8.6-50 MG PO TABS: 2.0000 | ORAL_TABLET | Freq: Every evening | ORAL | Status: DC | PRN

## 2019-11-15 MED ORDER — PREDNISONE 20 MG PO TABS: ORAL_TABLET | ORAL | 0 refills | Status: AC

## 2019-11-15 MED ORDER — FERROUS SULFATE 325 (65 FE) MG PO TABS: 325.0000 mg | ORAL_TABLET | Freq: Every day | ORAL | 0 refills | Status: DC

## 2019-11-15 MED ORDER — ONDANSETRON 4 MG PO TBDP
4.0000 mg | ORAL_TABLET | Freq: Three times a day (TID) | ORAL | 0 refills | Status: DC | PRN
Start: 2019-11-15 — End: 2019-12-07

## 2019-11-15 MED ORDER — POTASSIUM CHLORIDE CRYS ER 20 MEQ PO TBCR: 40.0000 meq | EXTENDED_RELEASE_TABLET | Freq: Once | ORAL | Status: DC

## 2019-11-15 NOTE — Progress Notes (Signed)
  Pt is being discharged home today. Discharge instructions including medications and follow up appointments given. Pt had no further questions at this time. 

## 2019-11-15 NOTE — Discharge Summary (Signed)
Physician Discharge Summary  VIERA OKONSKI PQD:[id number] DOB: 12-19-1980 DOA: 11/07/2019  PCP: Patient, No Pcp Per  Admit date: 11/07/2019 Discharge date: 11/15/2019  Admitted From: Home Disposition: Home  Recommendations for Outpatient Follow-up:  1. Follow up with PCP in 1-2 weeks 2. Please obtain BMP/CBC in one week your next doctors visit.  3. Iron supplements with bowel regimen prescribed 4. Valacyclovir 3 times daily for 3 more days 5. Outpatient referral to gastroenterology 6. Outpatient gastric emptying study, she will need to be off any narcotics or antimotility agents before getting the study done. 7. PPI twice daily to be taken before meals 8. Zofran as needed for nausea    Discharge Condition: Stable CODE STATUS: Full code Diet recommendation: Soft bland diet  Brief/Interim Summary: 39 year old female with a history ofmigraines,asthma, GERD, chronic pain, polysubstance abuse, depressionand anxietywho presented to the ED with persistent RUQ abdominal pain with radiation to right shoulder and decreased p.o. intake.  She's been seen in the ED 3 times within the past month prior to this admission for similar issues.  At this point, she's had an extensive workup with abdominal and chest imaging which don't clearly provide a clear unifying diagnosis.  Patient's extensive work-up in the hospital is remain negative, case was discussed by previous provider with gastroenterology, recommended outpatient follow-up.  On the day of discharge I spoke extensively with the patient in person.  Her mother was on FaceTime and patient's RN Crystal was also at bedside with me during my whole interaction.  Body mass index is 37.41 kg/m.    Generalized abdominal discomfort with infrequent nausea and vomiting Severe GERD -Suspect perhaps some form of functional dyspepsia versus GERD.  No endoscopic evaluation at this time.  Will discharge patient on PPI to be taken twice daily before  meals, bland diet as tolerated.  Follow-up outpatient with gastroenterology, seen by GI in the hospital. -Zofran prescribed to be taken prior to meals as needed for nausea -Outpatient gastric emptying study -Recent CT and HIDA scan negative.  Right upper quadrant ultrasound showed small gallbladder polyp.  Chronic pain Right shoulder tendinitis -Valacyclovir, and pain medication prescribed.  There is possible concerns for shingles by previous provider. -Recommend outpatient follow-up with orthopedic, she may benefit from steroid/Kenalog intra-articular injection if necessary.  History of anxiety/depression/chronic pain -Follow-up outpatient with her primary care provider.  Controlled substances would not be prescribed during this hospitalization, she can resume her home medications but avoid using any narcotics before her gastric emptying study  GERD -PPI IV twice daily  History of asthma -Continue inhalers   Discharge Diagnoses:  Principal Problem:   RUQ pain Active Problems:   Anxiety state   Asthma   GERD (gastroesophageal reflux disease)   Right upper quadrant pain      Consultations:  GI  Subjective: While I was in the room had extensive discussion with the patient regarding her care, her mother was on the face time and patient's RN Crystal was with me in the room as well Patient complained of severe heartburn with nausea.  She also reported of back pain on the sides from a previous accident but no obvious complaints are acute at this time.  I extensively went over patient's care with her and advised to take PPI twice daily 30 minutes before meals, avoid narcotic use or any agents that slows down her gut, she will need outpatient gastric emptying study, Zofran as needed before meals.  She will need outpatient follow-up with gastroenterology.  She  understands all these instructions and will follow outpatient.  Discharge Exam: Vitals:   11/15/19 0835 11/15/19 0837   BP:    Pulse:    Resp:    Temp:    SpO2: 98% 98%   Vitals:   11/14/19 2006 11/15/19 0356 11/15/19 0835 11/15/19 0837  BP: 106/68 112/78    Pulse: 80 78    Resp: 15 18    Temp: 98.1 F (36.7 C) 98.2 F (36.8 C)    TempSrc: Oral Oral    SpO2: 97% 98% 98% 98%  Weight:      Height:        General: Pt is alert, awake, not in acute distress Cardiovascular: RRR, S1/S2 +, no rubs, no gallops Respiratory: CTA bilaterally, no wheezing, no rhonchi Abdominal: Soft, NT, ND, bowel sounds + Extremities: no edema, no cyanosis  Discharge Instructions  Discharge Instructions    Ambulatory referral to Gastroenterology   Complete by: As directed      Allergies as of 11/15/2019      Reactions   Other Shortness Of Breath, Hives   Peanut-containing Drug Products Hives, Shortness Of Breath   Penicillins Hives, Nausea And Vomiting, Anaphylaxis   Other reaction(s): RASH Other reaction(s): RASH      Medication List    TAKE these medications   Advair Diskus 500-50 MCG/DOSE Aepb Generic drug: Fluticasone-Salmeterol Inhale 1 puff into the lungs 2 (two) times daily.   albuterol 108 (90 Base) MCG/ACT inhaler Commonly known as: VENTOLIN HFA Inhale 2 puffs into the lungs every 6 (six) hours as needed. For shortness of breath   alprazolam 2 MG tablet Commonly known as: XANAX Take 2 mg by mouth 3 (three) times daily as needed for anxiety.   alum & mag hydroxide-simeth 200-200-20 MG/5ML suspension Commonly known as: MAALOX/MYLANTA Take 15 mLs by mouth every 6 (six) hours as needed for indigestion or heartburn.   dicyclomine 20 MG tablet Commonly known as: BENTYL Take 1 tablet (20 mg total) by mouth 2 (two) times daily. What changed:   when to take this  reasons to take this   ferrous sulfate 325 (65 FE) MG tablet Take 1 tablet (325 mg total) by mouth daily with breakfast.   ibuprofen 200 MG tablet Commonly known as: ADVIL Take 400 mg by mouth every 6 (six) hours as needed for  headache or mild pain.   ipratropium-albuterol 0.5-2.5 (3) MG/3ML Soln Commonly known as: DUONEB Take 3 mLs by nebulization 4 (four) times daily as needed for wheezing or shortness of breath.   ondansetron 4 MG disintegrating tablet Commonly known as: Zofran ODT Take 1 tablet (4 mg total) by mouth every 8 (eight) hours as needed for nausea or vomiting. What changed: Another medication with the same name was added. Make sure you understand how and when to take each.   ondansetron 4 MG disintegrating tablet Commonly known as: Zofran ODT Take 1 tablet (4 mg total) by mouth every 8 (eight) hours as needed for nausea or vomiting. What changed: You were already taking a medication with the same name, and this prescription was added. Make sure you understand how and when to take each.   pantoprazole 40 MG tablet Commonly known as: PROTONIX Take 1 tablet (40 mg total) by mouth 2 (two) times daily before a meal. What changed: when to take this   polyethylene glycol 17 g packet Commonly known as: MIRALAX / GLYCOLAX Take 17 g by mouth daily as needed for moderate constipation or severe constipation.  predniSONE 20 MG tablet Commonly known as: DELTASONE Take 2 tablets (40 mg total) by mouth daily. What changed: Another medication with the same name was added. Make sure you understand how and when to take each.   predniSONE 20 MG tablet Commonly known as: DELTASONE Take 1 tablet (20 mg total) by mouth daily with breakfast for 1 day, THEN 1 tablet (20 mg total) daily with breakfast for 1 day. Start taking on: November 16, 2019 What changed: You were already taking a medication with the same name, and this prescription was added. Make sure you understand how and when to take each.   senna-docusate 8.6-50 MG tablet Commonly known as: Senokot-S Take 2 tablets by mouth at bedtime as needed for mild constipation or moderate constipation.   traMADol 50 MG tablet Commonly known as: ULTRAM Take 1  tablet (50 mg total) by mouth every 6 (six) hours as needed.   valACYclovir 1000 MG tablet Commonly known as: VALTREX Take 1 tablet (1,000 mg total) by mouth 3 (three) times daily for 3 days.       Follow-up Information    [facility] Follow up on 01/24/2020.   Why: Wednesday at 1:50 for your hospital follow up appointment.  If you call them to confirm your phone number, they will put you on a cancellation list for earlier appointment Contact information: [address] [place] [address] [phone]       Gastroenterology, [facility] Sprang .   Contact information: [address] [place] [address] [phone]              Allergies  Allergen Reactions  . Other Shortness Of Breath and Hives  . Peanut-Containing Drug Products Hives and Shortness Of Breath  . Penicillins Hives, Nausea And Vomiting and Anaphylaxis    Other reaction(s): RASH Other reaction(s): RASH     You were cared for by a hospitalist during your hospital stay. If you have any questions about your discharge medications or the care you received while you were in the hospital after you are discharged, you can call the unit and asked to speak with the hospitalist on call if the hospitalist that took care of you is not available. Once you are discharged, your primary care physician will handle any further medical issues. Please note that no refills for any discharge medications will be authorized once you are discharged, as it is imperative that you return to your primary care physician (or establish a relationship with a primary care physician if you do not have one) for your aftercare needs so that they can reassess your need for medications and monitor your lab values.   Procedures/Studies: CT ABDOMEN PELVIS WO CONTRAST  Result Date: 11/06/2019 CLINICAL DATA:  History of cholelithiasis with right upper quadrant pain EXAM: CT ABDOMEN AND PELVIS WITHOUT  CONTRAST TECHNIQUE: Multidetector CT imaging of the abdomen and pelvis was performed following the standard protocol without IV contrast. COMPARISON:  07/02/2009 FINDINGS: Lower chest: No acute abnormality. Hepatobiliary: No focal liver abnormality is seen. No gallstones, gallbladder wall thickening, or biliary dilatation. Pancreas: Unremarkable. No pancreatic ductal dilatation or surrounding inflammatory changes. Spleen: Normal in size without focal abnormality. Adrenals/Urinary Tract: Tiny nonobstructing stones are noted bilaterally. No obstructive changes are seen. Ureters are within normal limits. Bladder is partially distended. Adrenal glands are within normal limits. Stomach/Bowel: The appendix is within normal limits. No obstructive or inflammatory changes of the colon or small bowel are seen. The  stomach is within normal limits. Vascular/Lymphatic: No significant vascular findings are present. No enlarged abdominal or pelvic lymph nodes. Reproductive: Uterus and bilateral adnexa are unremarkable. Other: No abdominal wall hernia or abnormality. No abdominopelvic ascites. Musculoskeletal: No acute or significant osseous findings. IMPRESSION: Tiny nonobstructing stones bilaterally. No acute abnormality is noted. Electronically Signed   By: Alcide Clever M.D.   On: 11/06/2019 01:24   DG Chest 1 View  Result Date: 11/07/2019 CLINICAL DATA:  Pain EXAM: CHEST  1 VIEW COMPARISON:  October 15, 2019 FINDINGS: Lungs are clear. The heart size and pulmonary vascularity are normal. No adenopathy. No pneumothorax. No bone lesions. IMPRESSION: Lungs clear.  Cardiac silhouette within normal limits. Electronically Signed   By: Bretta Bang III M.D.   On: 11/07/2019 10:10   CT ANGIO CHEST PE W OR WO CONTRAST  Result Date: 11/09/2019 CLINICAL DATA:  Chest pain, shortness of breath EXAM: CT ANGIOGRAPHY CHEST WITH CONTRAST TECHNIQUE: Multidetector CT imaging of the chest was performed using the standard protocol during  bolus administration of intravenous contrast. Multiplanar CT image reconstructions and MIPs were obtained to evaluate the vascular anatomy. CONTRAST:  OMNIPAQUE IOHEXOL 350 MG/ML SOLN COMPARISON:  2019 FINDINGS: Cardiovascular: Satisfactory opacification of the pulmonary arteries to the segmental level. No evidence of pulmonary embolism. Normal heart size. No pericardial effusion. Mediastinum/Nodes: There are no enlarged lymph nodes identified. Visualized thyroid is unremarkable. Esophagus is unremarkable. Lungs/Pleura: Mild interstitial thickening with minimal nodularity in the upper lobes is new from 2018. Additional patchy atelectasis. No pleural effusion or pneumothorax. Upper Abdomen: No acute abnormality. Musculoskeletal: No acute osseous abnormality. Review of the MIP images confirms the above findings. IMPRESSION: No evidence of acute pulmonary embolism. New small areas of interstitial thickening and minimal nodularity in the upper lobes, which may be infectious/inflammatory. Additional patchy atelectasis. Electronically Signed   By: Guadlupe Spanish M.D.   On: 11/09/2019 10:52   MR THORACIC SPINE WO CONTRAST  Result Date: 11/11/2019 CLINICAL DATA:  Back pain and right radicular pain, approximately T6 by clinical exam. EXAM: MRI THORACIC SPINE WITHOUT CONTRAST TECHNIQUE: Multiplanar, multisequence MR imaging of the thoracic spine was performed. No intravenous contrast was administered. COMPARISON:  CT 11/09/2019 FINDINGS: Alignment:  Normal Vertebrae: Normal.  No fracture or focal lesion. Cord:  Normal.  No cord compression or focal lesion. Paraspinal and other soft tissues: Normal Disc levels: Thoracic intervertebral discs are normal. No degeneration, bulge or herniation. No stenosis of the canal or foramina. No facet arthropathy. IMPRESSION: Normal thoracic MRI. No abnormality seen to explain right T6 region discomfort. Electronically Signed   By: Paulina Fusi M.D.   On: 11/11/2019 13:11   MR  SHOULDER RIGHT WO CONTRAST  Result Date: 11/13/2019 CLINICAL DATA:  Right shoulder pain. EXAM: MRI OF THE RIGHT SHOULDER WITHOUT CONTRAST TECHNIQUE: Multiplanar, multisequence MR imaging of the shoulder was performed. No intravenous contrast was administered. COMPARISON:  None. FINDINGS: Rotator cuff: Mild tendinosis of the supraspinatus tendon. Mild tendinosis of the infraspinatus tendon. Teres minor tendon is intact. Subscapularis tendon is intact. Muscles: No muscle atrophy or edema. No intramuscular fluid collection or hematoma. Biceps Long Head: Intraarticular and extraarticular portions of the biceps tendon are intact. Acromioclavicular Joint: Mild arthropathy of the acromioclavicular joint. Type I acromion. No subacromial/subdeltoid bursal fluid. Glenohumeral Joint: No joint effusion. No chondral defect. Labrum: Grossly intact, but evaluation is limited by lack of intraarticular fluid/contrast. Bones: No fracture or dislocation. No aggressive osseous lesion. Other: No fluid collection or hematoma. IMPRESSION: 1.  Mild tendinosis of the supraspinatus and infraspinatus tendons without a tear. 2. Electronically Signed   By: Elige Ko   On: 11/13/2019 15:03   NM Hepato W/EF  Result Date: 11/08/2019 CLINICAL DATA:  Right upper quadrant abdominal pain. CT and ultrasound unremarkable. EXAM: NUCLEAR MEDICINE HEPATOBILIARY IMAGING WITH GALLBLADDER EF TECHNIQUE: Sequential images of the abdomen were obtained out to 60 minutes following intravenous administration of radiopharmaceutical. After oral ingestion of Ensure, gallbladder ejection fraction was determined. At 60 min, normal ejection fraction is greater than 33%. RADIOPHARMACEUTICALS:  5.4 mCi Tc-66m  Choletec IV COMPARISON:  CT 11/06/2019.  Ultrasound 11/05/2019. FINDINGS: Prompt uptake and biliary excretion of activity by the liver is seen. Gallbladder activity is visualized, consistent with patency of cystic duct. Biliary activity passes into small  bowel, consistent with patent common bile duct. Calculated gallbladder ejection fraction is 85%. (Normal gallbladder ejection fraction with Ensure is greater than 33%.) IMPRESSION: Normal nuclear medicine hepatobiliary scan and gallbladder ejection fraction. Electronically Signed   By: Paulina Fusi M.D.   On: 11/08/2019 17:00   US Abdomen Limited  Result Date: 11/05/2019 CLINICAL DATA:  Pain EXAM: ULTRASOUND ABDOMEN LIMITED RIGHT UPPER QUADRANT COMPARISON:  September 16, 2019 FINDINGS: Gallbladder: There is no gallbladder wall thickening. Multiple small gallbladder polyps are noted measuring up to approximately 3 mm. The sonographic Eulah Pont sign is reported as positive. Common bile duct: Diameter: 2 mm Liver: No focal lesion identified. Within normal limits in parenchymal echogenicity. Portal vein is patent on color Doppler imaging with normal direction of blood flow towards the liver. Other: None. IMPRESSION: 1. No acute abnormality. No evidence for cholelithiasis or acute cholecystitis. 2. Again noted is a small gallbladder polyp. Per consensus statement, gallbladder polyps less than 6 mm are usually benign not requiring follow-up Electronically Signed   By: Katherine Mantle M.D.   On: 11/05/2019 20:36   DG UGI W SINGLE CM (SOL OR THIN BA)  Result Date: 11/09/2019 CLINICAL DATA:  Right upper quadrant abdominal pain. Additional history provided by patient: Patient reports dysphagia, right upper quadrant pain, right lower rib pain, intractable nausea/vomiting. EXAM: UPPER GI SERIES WITH KUB TECHNIQUE: After obtaining a scout radiograph a routine upper GI series was performed using thin and high density barium. FLUOROSCOPY TIME:  Fluoroscopy Time:  2 minutes, 24 seconds. Radiation Exposure Index (if provided by the fluoroscopic device): 83.6 mGy Number of Acquired Spot Images: 8 COMPARISON:  CT angiogram chest 11/09/2019, CT abdomen/pelvis 11/06/2019 FINDINGS: A scout radiograph of the abdomen demonstrates  residual contrast within the renal collecting systems and bladder. There is a nonobstructive bowel gas pattern. No acute bony abnormality identified. Fluoroscopic evaluation demonstrates normal caliber and smooth contour of the esophagus. No evidence of fixed stricture, mass or mucosal abnormality. Normal esophageal motility was observed. No gastroesophageal reflux was observed. The patient swallowed a 13 mm barium tablet, which freely passed into the stomach. Small sliding hiatal hernia. Otherwise normal appearance of the stomach, duodenal bulb and duodenal sweep. No evidence of ulceration, fold thickening or mass. IMPRESSION: Small sliding hiatal hernia. Otherwise unremarkable upper GI series as detailed. Electronically Signed   By: Jackey Loge DO   On: 11/09/2019 12:58   ECHOCARDIOGRAM COMPLETE  Result Date: 11/12/2019    ECHOCARDIOGRAM REPORT   Patient Name:   HUNTLEIGH DOOLEN Date of Exam: 11/12/2019 Medical Rec #:  [id number]          Height:       61.0 in Accession #:    [id number]  Weight:       198.0 lb Date of Birth:  June 13, 1980           BSA:          1.881 m Patient Age:    39 years           BP:           115/74 mmHg Patient Gender: F                  HR:           113 bpm. Exam Location:  Inpatient Procedure: 2D Echo, Cardiac Doppler and Color Doppler Indications:     R00.0 Tachycardia  History:         Patient has no prior history of Echocardiogram examinations.                  Abnormal ECG and Tachycardia. Right shoulder pain.  Sonographer:     Sheralyn Boatman RDCS Referring Phys:  [id number] CALDWELL POWELL JR Diagnosing Phys: Donato Schultz MD  Sonographer Comments: Apical images subotimal due to sensitivity to pressure of probe. IMPRESSIONS  1. Left ventricular ejection fraction, by estimation, is 65 to 70%. The left ventricle has normal function. The left ventricle has no regional wall motion abnormalities. Left ventricular diastolic parameters are indeterminate.  2. Right ventricular systolic  function is normal. The right ventricular size is normal.  3. The mitral valve is normal in structure. Trivial mitral valve regurgitation. No evidence of mitral stenosis.  4. The aortic valve is normal in structure. Aortic valve regurgitation is not visualized. No aortic stenosis is present.  5. The inferior vena cava is normal in size with greater than 50% respiratory variability, suggesting right atrial pressure of 3 mmHg. FINDINGS  Left Ventricle: Left ventricular ejection fraction, by estimation, is 65 to 70%. The left ventricle has normal function. The left ventricle has no regional wall motion abnormalities. The left ventricular internal cavity size was normal in size. There is  no left ventricular hypertrophy. Left ventricular diastolic parameters are indeterminate. Right Ventricle: The right ventricular size is normal. No increase in right ventricular wall thickness. Right ventricular systolic function is normal. Left Atrium: Left atrial size was normal in size. Right Atrium: Right atrial size was normal in size. Pericardium: There is no evidence of pericardial effusion. Mitral Valve: The mitral valve is normal in structure. Normal mobility of the mitral valve leaflets. Trivial mitral valve regurgitation. No evidence of mitral valve stenosis. Tricuspid Valve: The tricuspid valve is normal in structure. Tricuspid valve regurgitation is not demonstrated. No evidence of tricuspid stenosis. Aortic Valve: The aortic valve is normal in structure. Aortic valve regurgitation is not visualized. No aortic stenosis is present. Pulmonic Valve: The pulmonic valve was normal in structure. Pulmonic valve regurgitation is not visualized. No evidence of pulmonic stenosis. Aorta: The aortic root is normal in size and structure. Venous: The inferior vena cava is normal in size with greater than 50% respiratory variability, suggesting right atrial pressure of 3 mmHg. IAS/Shunts: No atrial level shunt detected by color flow  Doppler.  LEFT VENTRICLE PLAX 2D LVIDd:         3.84 cm     Diastology LVIDs:         2.78 cm     LV e' lateral:   13.60 cm/s LV PW:         1.27 cm     LV E/e' lateral: 6.1 LV IVS:  0.93 cm     LV e' medial:    4.57 cm/s LVOT diam:     2.00 cm     LV E/e' medial:  18.2 LV SV:         59 LV SV Index:   32 LVOT Area:     3.14 cm  LV Volumes (MOD) LV vol d, MOD A2C: 81.2 ml LV vol d, MOD A4C: 70.2 ml LV vol s, MOD A2C: 32.1 ml LV vol s, MOD A4C: 27.7 ml LV SV MOD A2C:     49.1 ml LV SV MOD A4C:     70.2 ml LV SV MOD BP:      48.0 ml RIGHT VENTRICLE             IVC RV S prime:     11.30 cm/s  IVC diam: 1.28 cm TAPSE (M-mode): 1.8 cm LEFT ATRIUM             Index       RIGHT ATRIUM           Index LA diam:        3.10 cm 1.65 cm/m  RA Area:     14.00 cm LA Vol (A2C):   39.6 ml 21.06 ml/m RA Volume:   34.10 ml  18.13 ml/m LA Vol (A4C):   21.9 ml 11.64 ml/m LA Biplane Vol: 30.5 ml 16.22 ml/m  AORTIC VALVE LVOT Vmax:   102.00 cm/s LVOT Vmean:  69.900 cm/s LVOT VTI:    0.189 m  AORTA Ao Root diam: 3.20 cm Ao Asc diam:  2.90 cm MITRAL VALVE MV Area (PHT): 6.96 cm     SHUNTS MV Decel Time: 109 msec     Systemic VTI:  0.19 m MV E velocity: 83.20 cm/s   Systemic Diam: 2.00 cm MV A velocity: 129.00 cm/s MV E/A ratio:  0.64 Donato Schultz MD Electronically signed by Donato Schultz MD Signature Date/Time: 11/12/2019/4:03:29 PM    Final (Updated)      The results of significant diagnostics from this hospitalization (including imaging, microbiology, ancillary and laboratory) are listed below for reference.     Microbiology: Recent Results (from the past 240 hour(s))  SARS Coronavirus 2 by RT PCR (hospital order, performed in [facility] hospital lab) Nasopharyngeal Nasopharyngeal Swab     Status: None   Collection Time: 11/07/19  3:06 PM   Specimen: Nasopharyngeal Swab  Result Value Ref Range Status   SARS Coronavirus 2 NEGATIVE NEGATIVE Final    Comment: (NOTE) SARS-CoV-2 target nucleic acids are NOT  DETECTED.  The SARS-CoV-2 RNA is generally detectable in upper and lower respiratory specimens during the acute phase of infection. The lowest concentration of SARS-CoV-2 viral copies this assay can detect is 250 copies / mL. A negative result does not preclude SARS-CoV-2 infection and should not be used as the sole basis for treatment or other patient management decisions.  A negative result may occur with improper specimen collection / handling, submission of specimen other than nasopharyngeal swab, presence of viral mutation(s) within the areas targeted by this assay, and inadequate number of viral copies (<250 copies / mL). A negative result must be combined with clinical observations, patient history, and epidemiological information.  Fact Sheet for Patients:   BoilerBrush.com.cy  Fact Sheet for Healthcare Providers: [email or web address].com/  This test is not yet approved or  cleared by the [place] FDA and has been authorized for detection and/or diagnosis of SARS-CoV-2 by FDA under an Emergency Use  Authorization (EUA).  This EUA will remain in effect (meaning this test can be used) for the duration of the COVID-19 declaration under Section 564(b)(1) of the Act, 21 U.S.C. section 360bbb-3(b)(1), unless the authorization is terminated or revoked sooner.  Performed at [facility], [address], [place], [place] [address]   Surgical pcr screen     Status: Abnormal   Collection Time: 11/07/19  9:04 PM   Specimen: Nasal Mucosa; Nasal Swab  Result Value Ref Range Status   MRSA, PCR NEGATIVE NEGATIVE Final   Staphylococcus aureus POSITIVE (A) NEGATIVE Final    Comment: (NOTE) The Xpert SA Assay (FDA approved for NASAL specimens in patients 63 years of age and older), is one component of a comprehensive surveillance program. It is not intended to diagnose infection nor to guide or monitor treatment. Performed  at [facility], [address] [place], [place] [address]      Labs: BNP (last 3 results) Recent Labs    11/09/19 0849  BNP 29.7   Basic Metabolic Panel: Recent Labs  Lab 11/11/19 0346 11/12/19 0400 11/13/19 0350 11/14/19 0750 11/15/19 0340  NA 137 141 141 136 136  K 3.5 3.3* 3.7 3.8 3.6  CL 104 113* 108 103 104  CO2 20* 22 23 23 22   GLUCOSE 91 109* 89 103* 107*  BUN 14 21* 17 7 9   CREATININE 0.62 0.59 0.62 0.65 0.80  CALCIUM 9.2 8.7* 8.8* 8.8* 9.2  MG 1.9 1.7 1.8 2.0 1.9  PHOS 4.3 2.9 2.9 3.2 4.4   Liver Function Tests: Recent Labs  Lab 11/11/19 0346 11/12/19 0400 11/13/19 0350 11/14/19 0750 11/15/19 0340  AST 10* 11* 9* 18 14*  ALT 10 10 10 13 15   ALKPHOS 43 39 33* 42 44  BILITOT 0.7 0.4 0.4 0.4 0.7  PROT 6.3* 5.8* 5.8* 6.3* 6.6  ALBUMIN 3.5 3.3* 3.4* 3.8 4.0   No results for input(s): LIPASE, AMYLASE in the last 168 hours. No results for input(s): AMMONIA in the last 168 hours. CBC: Recent Labs  Lab 11/11/19 0346 11/12/19 0400 11/13/19 0350 11/14/19 0750 11/15/19 0340  WBC 8.5 9.0 10.0 6.0 6.4  NEUTROABS 6.2 6.5 7.3 3.4 3.6  HGB 10.5* 9.3* 9.4* 10.0* 10.9*  HCT 35.0* 30.9* 31.3* 32.1* 35.9*  MCV 80.6 81.3 80.7 77.2* 79.6*  PLT 270 259 259 249 241   Cardiac Enzymes: Recent Labs  Lab 11/13/19 0350  CKTOTAL 26*   BNP: Invalid input(s): POCBNP CBG: No results for input(s): GLUCAP in the last 168 hours. D-Dimer No results for input(s): DDIMER in the last 72 hours. Hgb A1c Recent Labs    11/13/19 0350  HGBA1C 5.2   Lipid Profile No results for input(s): CHOL, HDL, LDLCALC, TRIG, CHOLHDL, LDLDIRECT in the last 72 hours. Thyroid function studies No results for input(s): TSH, T4TOTAL, T3FREE, THYROIDAB in the last 72 hours.  Invalid input(s): FREET3 Anemia work up No results for input(s): VITAMINB12, FOLATE, FERRITIN, TIBC, IRON, RETICCTPCT in the last 72 hours. Urinalysis    Component Value Date/Time    COLORURINE YELLOW 11/10/2019 1859   APPEARANCEUR CLEAR 11/10/2019 1859   LABSPEC 1.014 11/10/2019 1859   PHURINE 5.0 11/10/2019 1859   GLUCOSEU NEGATIVE 11/10/2019 1859   HGBUR NEGATIVE 11/10/2019 1859   BILIRUBINUR NEGATIVE 11/10/2019 1859   KETONESUR 80 (A) 11/10/2019 1859   PROTEINUR NEGATIVE 11/10/2019 1859   UROBILINOGEN 0.2 08/15/2010 2057   NITRITE NEGATIVE 11/10/2019 1859   LEUKOCYTESUR NEGATIVE 11/10/2019 1859  Sepsis Labs Invalid input(s): PROCALCITONIN,  WBC,  LACTICIDVEN Microbiology Recent Results (from the past 240 hour(s))  SARS Coronavirus 2 by RT PCR (hospital order, [facility] in [facility]) Nasopharyngeal Nasopharyngeal Swab     Status: None   Collection Time: 11/07/19  3:06 PM   Specimen: Nasopharyngeal Swab  Result Value Ref Range Status   SARS Coronavirus 2 NEGATIVE NEGATIVE Final    Comment: (NOTE) SARS-CoV-2 target nucleic acids are NOT DETECTED.  The SARS-CoV-2 RNA is generally detectable in upper and lower respiratory specimens during the acute phase of infection. The lowest concentration of SARS-CoV-2 viral copies this assay can detect is 250 copies / mL. A negative result does not preclude SARS-CoV-2 infection and should not be used as the sole basis for treatment or other patient management decisions.  A negative result may occur with improper specimen collection / handling, submission of specimen other than nasopharyngeal swab, presence of viral mutation(s) within the areas targeted by this assay, and inadequate number of viral copies (<250 copies / mL). A negative result must be combined with clinical observations, patient history, and epidemiological information.  Fact Sheet for Patients:   BoilerBrush.com.cyhttps://www.fda.gov/media/136312/download  Fact Sheet for Healthcare Providers: https://pope.com/https://www.fda.gov/media/136313/download  This test is not yet approved or  cleared by the [place] FDA and has been authorized for detection and/or  diagnosis of SARS-CoV-2 by FDA under an Emergency Use Authorization (EUA).  This EUA will remain in effect (meaning this test can be used) for the duration of the COVID-19 declaration under Section 564(b)(1) of the Act, 21 U.S.C. section 360bbb-3(b)(1), unless the authorization is terminated or revoked sooner.  Performed at [facility], [address] [place], [place] [address]   Surgical pcr screen     Status: Abnormal   Collection Time: 11/07/19  9:04 PM   Specimen: Nasal Mucosa; Nasal Swab  Result Value Ref Range Status   MRSA, PCR NEGATIVE NEGATIVE Final   Staphylococcus aureus POSITIVE (A) NEGATIVE Final    Comment: (NOTE) The Xpert SA Assay (FDA approved for NASAL specimens in patients 38 years of age and older), is one component of a comprehensive surveillance program. It is not intended to diagnose infection nor to guide or monitor treatment. Performed at [facility], [address] [place], [place] [address]      Time coordinating discharge:  I have spent 35 minutes face to face with the patient and on the ward discussing the patients care, assessment, plan and disposition with other care givers. >50% of the time was devoted counseling the patient about the risks and benefits of treatment/Discharge disposition and coordinating care.   SIGNED:   Dimple NanasAnkit Chirag Lakaya Tolen, MD  [facility] 11/15/2019, 12:12 PM   If 7PM-7AM, please contact night-coverage

## 2019-11-16 ENCOUNTER — Telehealth (HOSPITAL_COMMUNITY): Payer: Self-pay | Admitting: Emergency Medicine

## 2019-11-16 NOTE — Telephone Encounter (Signed)
Pt requesting zofran and not filled by pharmacy

## 2019-11-24 ENCOUNTER — Encounter: Payer: Self-pay | Admitting: Internal Medicine

## 2019-12-07 ENCOUNTER — Other Ambulatory Visit: Payer: Self-pay

## 2019-12-07 ENCOUNTER — Encounter (HOSPITAL_COMMUNITY): Payer: Self-pay

## 2019-12-07 ENCOUNTER — Encounter (HOSPITAL_COMMUNITY)
Admission: RE | Admit: 2019-12-07 | Discharge: 2019-12-07 | Disposition: A | Discharge status: Home / Self Care | Payer: Medicaid Other | Source: Ambulatory Visit | Attending: General Surgery | Admitting: General Surgery

## 2019-12-07 ENCOUNTER — Ambulatory Visit (INDEPENDENT_AMBULATORY_CARE_PROVIDER_SITE_OTHER): Payer: Medicaid Other | Admitting: General Surgery

## 2019-12-07 ENCOUNTER — Other Ambulatory Visit (HOSPITAL_COMMUNITY)
Admission: RE | Admit: 2019-12-07 | Discharge: 2019-12-07 | Disposition: A | Discharge status: Home / Self Care | Payer: Medicaid Other | Source: Ambulatory Visit | Attending: General Surgery | Admitting: General Surgery

## 2019-12-07 ENCOUNTER — Encounter: Payer: Self-pay | Admitting: General Surgery

## 2019-12-07 VITALS — BP 112/62 | HR 107 | Temp 98.4°F | Resp 12 | Ht 61.0 in | Wt 190.0 lb

## 2019-12-07 DIAGNOSIS — Z01812 Encounter for preprocedural laboratory examination: Secondary | ICD-10-CM | POA: Insufficient documentation

## 2019-12-07 DIAGNOSIS — K8050 Calculus of bile duct without cholangitis or cholecystitis without obstruction: Secondary | ICD-10-CM

## 2019-12-07 DIAGNOSIS — Z20822 Contact with and (suspected) exposure to covid-19: Secondary | ICD-10-CM | POA: Diagnosis not present

## 2019-12-07 HISTORY — DX: Gastro-esophageal reflux disease without esophagitis: K21.9

## 2019-12-07 LAB — PREGNANCY, URINE: Preg Test, Ur: NEGATIVE

## 2019-12-07 MED ORDER — ONDANSETRON HCL 4 MG PO TABS: 4.0000 mg | ORAL_TABLET | Freq: Three times a day (TID) | ORAL | 1 refills | Status: DC | PRN

## 2019-12-07 NOTE — Patient Instructions (Signed)
Vicki Moore  12/07/2019     @PREFPERIOPPHARMACY @   Your procedure is scheduled on  12/08/2019.  Report to 12/10/2019 at  0700  A.M.  Call this number if you have problems the morning of surgery:  [phone]   Remember:  Do not eat or drink after midnight.                     Take these medicines the morning of surgery with A SIP OF WATER  Xanax(if needed), zofran(if needed), protonix.    Do not wear jewelry, make-up or nail polish.  Do not wear lotions, powders, or perfume. Please wear deodorant and brush your teeth.  Do not shave 48 hours prior to surgery.  Men may shave face and neck.  Do not bring valuables to the hospital.  [facility] is not responsible for any belongings or valuables.  Contacts, dentures or bridgework may not be worn into surgery.  Leave your suitcase in the car.  After surgery it may be brought to your room.  For patients admitted to the hospital, discharge time will be determined by your treatment team.  Patients discharged the day of surgery will not be allowed to drive home.   Name and phone number of your driver:   family Special instructions:  DO NOT smoke the morning of your procedure.  Please read over the following fact sheets that you were given. Anesthesia Post-op Instructions and Care and Recovery After Surgery       Laparoscopic Cholecystectomy, Care After This sheet gives you information about how to care for yourself after your procedure. Your health care provider may also give you more specific instructions. If you have problems or questions, contact your health care provider. What can I expect after the procedure? After the procedure, it is common to have:  Pain at your incision sites. You will be given medicines to control this pain.  Mild nausea or vomiting.  Bloating and possible shoulder pain from the air-like gas that was used during the procedure. Follow these instructions at home: Incision  care   Follow instructions from your health care provider about how to take care of your incisions. Make sure you: ? Wash your hands with soap and water before you change your bandage (dressing). If soap and water are not available, use hand sanitizer. ? Change your dressing as told by your health care provider. ? Leave stitches (sutures), skin glue, or adhesive strips in place. These skin closures may need to be in place for 2 weeks or longer. If adhesive strip edges start to loosen and curl up, you may trim the loose edges. Do not remove adhesive strips completely unless your health care provider tells you to do that.  Do not take baths, swim, or use a hot tub until your health care provider approves. Ask your health care provider if you can take showers. You may only be allowed to take sponge baths for bathing.  Check your incision area every day for signs of infection. Check for: ? More redness, swelling, or pain. ? More fluid or blood. ? Warmth. ? Pus or a bad smell. Activity  Do not drive or use heavy machinery while taking prescription pain medicine.  Do not lift anything that is heavier than 10 lb (4.5 kg) until your health care provider approves.  Do not play contact sports until your health care provider approves.  Do not drive for  24 hours if you were given a medicine to help you relax (sedative).  Rest as needed. Do not return to work or school until your health care provider approves. General instructions  Take over-the-counter and prescription medicines only as told by your health care provider.  To prevent or treat constipation while you are taking prescription pain medicine, your health care provider may recommend that you: ? Drink enough fluid to keep your urine clear or pale yellow. ? Take over-the-counter or prescription medicines. ? Eat foods that are high in fiber, such as fresh fruits and vegetables, whole grains, and beans. ? Limit foods that are high in fat  and processed sugars, such as fried and sweet foods. Contact a health care provider if:  You develop a rash.  You have more redness, swelling, or pain around your incisions.  You have more fluid or blood coming from your incisions.  Your incisions feel warm to the touch.  You have pus or a bad smell coming from your incisions.  You have a fever.  One or more of your incisions breaks open. Get help right away if:  You have trouble breathing.  You have chest pain.  You have increasing pain in your shoulders.  You faint or feel dizzy when you stand.  You have severe pain in your abdomen.  You have nausea or vomiting that lasts for more than one day.  You have leg pain. This information is not intended to replace advice given to you by your health care provider. Make sure you discuss any questions you have with your health care provider. Document Revised: 03/12/2017 Document Reviewed: 09/16/2015 Elsevier Patient Education  2020 Else[employer] Inc[employer]  General Anesthesia, Adult, Care After This sheet gives you information about how to care for yourself after your procedure. Your health care provider may also give you more specific instructions. If you have problems or questions, contact your health care provider. What can I expect after the procedure? After the procedure, the following side effects are common:  Pain or discomfort at the IV site.  Nausea.  Vomiting.  Sore throat.  Trouble concentrating.  Feeling cold or chills.  Weak or tired.  Sleepiness and fatigue.  Soreness and body aches. These side effects can affect parts of the body that were not involved in surgery. Follow these instructions at home:  For at least 24 hours after the procedure:  Have a responsible adult stay with you. It is important to have someone help care for you until you are awake and alert.  Rest as needed.  Do not: ? Participate in activities in which you could fall or become  injured. ? Drive. ? Use heavy machinery. ? Drink alcohol. ? Take sleeping pills or medicines that cause drowsiness. ? Make important decisions or sign legal documents. ? Take care of children on your own. Eating and drinking  Follow any instructions from your health care provider about eating or drinking restrictions.  When you feel hungry, start by eating small amounts of foods that are soft and easy to digest (bland), such as toast. Gradually return to your regular diet.  Drink enough fluid to keep your urine pale yellow.  If you vomit, rehydrate by drinking water, juice, or clear broth. General instructions  If you have sleep apnea, surgery and certain medicines can increase your risk for breathing problems. Follow instructions from your health care provider about wearing your sleep device: ? Anytime you are sleeping, including during daytime naps. ? While taking  prescription pain medicines, sleeping medicines, or medicines that make you drowsy.  Return to your normal activities as told by your health care provider. Ask your health care provider what activities are safe for you.  Take over-the-counter and prescription medicines only as told by your health care provider.  If you smoke, do not smoke without supervision.  Keep all follow-up visits as told by your health care provider. This is important. Contact a health care provider if:  You have nausea or vomiting that does not get better with medicine.  You cannot eat or drink without vomiting.  You have pain that does not get better with medicine.  You are unable to pass urine.  You develop a skin rash.  You have a fever.  You have redness around your IV site that gets worse. Get help right away if:  You have difficulty breathing.  You have chest pain.  You have blood in your urine or stool, or you vomit blood. Summary  After the procedure, it is common to have a sore throat or nausea. It is also common to  feel tired.  Have a responsible adult stay with you for the first 24 hours after general anesthesia. It is important to have someone help care for you until you are awake and alert.  When you feel hungry, start by eating small amounts of foods that are soft and easy to digest (bland), such as toast. Gradually return to your regular diet.  Drink enough fluid to keep your urine pale yellow.  Return to your normal activities as told by your health care provider. Ask your health care provider what activities are safe for you. This information is not intended to replace advice given to you by your health care provider. Make sure you discuss any questions you have with your health care provider. Document Revised: 04/02/2017 Document Reviewed: 11/13/2016 Elsevier Patient Education  2020 [employer]. How to Use Chlorhexidine for Bathing Chlorhexidine gluconate (CHG) is a germ-killing (antiseptic) solution that is used to clean the skin. It can get rid of the bacteria that normally live on the skin and can keep them away for about 24 hours. To clean your skin with CHG, you may be given:  A CHG solution to use in the shower or as part of a sponge bath.  A prepackaged cloth that contains CHG. Cleaning your skin with CHG may help lower the risk for infection:  While you are staying in the intensive care unit of the hospital.  If you have a vascular access, such as a central line, to provide short-term or long-term access to your veins.  If you have a catheter to drain urine from your bladder.  If you are on a ventilator. A ventilator is a machine that helps you breathe by moving air in and out of your lungs.  After surgery. What are the risks? Risks of using CHG include:  A skin reaction.  Hearing loss, if CHG gets in your ears.  Eye injury, if CHG gets in your eyes and is not rinsed out.  The CHG product catching fire. Make sure that you avoid smoking and flames after applying CHG to  your skin. Do not use CHG:  If you have a chlorhexidine allergy or have previously reacted to chlorhexidine.  On babies younger than 42 months of age. How to use CHG solution  Use CHG only as told by your health care provider, and follow the instructions on the label.  Use the full  amount of CHG as directed. Usually, this is one bottle. During a shower Follow these steps when using CHG solution during a shower (unless your health care provider gives you different instructions): 1. Start the shower. 2. Use your normal soap and shampoo to wash your face and hair. 3. Turn off the shower or move out of the shower stream. 4. Pour the CHG onto a clean washcloth. Do not use any type of brush or rough-edged sponge. 5. Starting at your neck, lather your body down to your toes. Make sure you follow these instructions: ? If you will be having surgery, pay special attention to the part of your body where you will be having surgery. Scrub this area for at least 1 minute. ? Do not use CHG on your head or face. If the solution gets into your ears or eyes, rinse them well with water. ? Avoid your genital area. ? Avoid any areas of skin that have broken skin, cuts, or scrapes. ? Scrub your back and under your arms. Make sure to wash skin folds. 6. Let the lather sit on your skin for 1-2 minutes or as long as told by your health care provider. 7. Thoroughly rinse your entire body in the shower. Make sure that all body creases and crevices are rinsed well. 8. Dry off with a clean towel. Do not put any substances on your body afterward--such as powder, lotion, or perfume--unless you are told to do so by your health care provider. Only use lotions that are recommended by the manufacturer. 9. Put on clean clothes or pajamas. 10. If it is the night before your surgery, sleep in clean sheets.  During a sponge bath Follow these steps when using CHG solution during a sponge bath (unless your health care provider  gives you different instructions): 1. Use your normal soap and shampoo to wash your face and hair. 2. Pour the CHG onto a clean washcloth. 3. Starting at your neck, lather your body down to your toes. Make sure you follow these instructions: ? If you will be having surgery, pay special attention to the part of your body where you will be having surgery. Scrub this area for at least 1 minute. ? Do not use CHG on your head or face. If the solution gets into your ears or eyes, rinse them well with water. ? Avoid your genital area. ? Avoid any areas of skin that have broken skin, cuts, or scrapes. ? Scrub your back and under your arms. Make sure to wash skin folds. 4. Let the lather sit on your skin for 1-2 minutes or as long as told by your health care provider. 5. Using a different clean, wet washcloth, thoroughly rinse your entire body. Make sure that all body creases and crevices are rinsed well. 6. Dry off with a clean towel. Do not put any substances on your body afterward--such as powder, lotion, or perfume--unless you are told to do so by your health care provider. Only use lotions that are recommended by the manufacturer. 7. Put on clean clothes or pajamas. 8. If it is the night before your surgery, sleep in clean sheets. How to use CHG prepackaged cloths  Only use CHG cloths as told by your health care provider, and follow the instructions on the label.  Use the CHG cloth on clean, dry skin.  Do not use the CHG cloth on your head or face unless your health care provider tells you to.  When washing with the  CHG cloth: ? Avoid your genital area. ? Avoid any areas of skin that have broken skin, cuts, or scrapes. Before surgery Follow these steps when using a CHG cloth to clean before surgery (unless your health care provider gives you different instructions): 1. Using the CHG cloth, vigorously scrub the part of your body where you will be having surgery. Scrub using a back-and-forth  motion for 3 minutes. The area on your body should be completely wet with CHG when you are done scrubbing. 2. Do not rinse. Discard the cloth and let the area air-dry. Do not put any substances on the area afterward, such as powder, lotion, or perfume. 3. Put on clean clothes or pajamas. 4. If it is the night before your surgery, sleep in clean sheets.  For general bathing Follow these steps when using CHG cloths for general bathing (unless your health care provider gives you different instructions). 1. Use a separate CHG cloth for each area of your body. Make sure you wash between any folds of skin and between your fingers and toes. Wash your body in the following order, switching to a new cloth after each step: ? The front of your neck, shoulders, and chest. ? Both of your arms, under your arms, and your hands. ? Your stomach and groin area, avoiding the genitals. ? Your right leg and foot. ? Your left leg and foot. ? The back of your neck, your back, and your buttocks. 2. Do not rinse. Discard the cloth and let the area air-dry. Do not put any substances on your body afterward--such as powder, lotion, or perfume--unless you are told to do so by your health care provider. Only use lotions that are recommended by the manufacturer. 3. Put on clean clothes or pajamas. Contact a health care provider if:  Your skin gets irritated after scrubbing.  You have questions about using your solution or cloth. Get help right away if:  Your eyes become very red or swollen.  Your eyes itch badly.  Your skin itches badly and is red or swollen.  Your hearing changes.  You have trouble seeing.  You have swelling or tingling in your mouth or throat.  You have trouble breathing.  You swallow any chlorhexidine. Summary  Chlorhexidine gluconate (CHG) is a germ-killing (antiseptic) solution that is used to clean the skin. Cleaning your skin with CHG may help to lower your risk for infection.  You  may be given CHG to use for bathing. It may be in a bottle or in a prepackaged cloth to use on your skin. Carefully follow your health care provider's instructions and the instructions on the product label.  Do not use CHG if you have a chlorhexidine allergy.  Contact your health care provider if your skin gets irritated after scrubbing. This information is not intended to replace advice given to you by your health care provider. Make sure you discuss any questions you have with your health care provider. Document Revised: 06/16/2018 Document Reviewed: 02/25/2017 Elsevier Patient Education  2020 [employer].

## 2019-12-07 NOTE — Patient Instructions (Signed)
Vicki Moore  12/07/2019     @PREFPERIOPPHARMACY @   Your procedure is scheduled on  12/08/2019  Report to [facility] at  0700  A.M.  Call this number if you have problems the morning of surgery:  [phone]   Remember:  Do not eat or drink after midnight.                        Take these medicines the morning of surgery with A SIP OF WATER Xanax(if needed), zofran)if needed), protonix.    Do not wear jewelry, make-up or nail polish.  Do not wear lotions, powders, or perfumes. Please wear deodorant and brush your teeth.  Do not shave 48 hours prior to surgery.  Men may shave face and neck.  Do not bring valuables to the hospital.  [facility] is not responsible for any belongings or valuables.  Contacts, dentures or bridgework may not be worn into surgery.  Leave your suitcase in the car.  After surgery it may be brought to your room.  For patients admitted to the hospital, discharge time will be determined by your treatment team.  Patients discharged the day of surgery will not be allowed to drive home.   Name and phone number of your driver:   family Special instructions:  DO NOT smoke the morning of your procedure.  Please read over the following fact sheets that you were given. Anesthesia Post-op Instructions and Care and Recovery After Surgery       Laparoscopic Cholecystectomy, Care After This sheet gives you information about how to care for yourself after your procedure. Your health care provider may also give you more specific instructions. If you have problems or questions, contact your health care provider. What can I expect after the procedure? After the procedure, it is common to have:  Pain at your incision sites. You will be given medicines to control this pain.  Mild nausea or vomiting.  Bloating and possible shoulder pain from the air-like gas that was used during the procedure. Follow these instructions at home: Incision  care   Follow instructions from your health care provider about how to take care of your incisions. Make sure you: ? Wash your hands with soap and water before you change your bandage (dressing). If soap and water are not available, use hand sanitizer. ? Change your dressing as told by your health care provider. ? Leave stitches (sutures), skin glue, or adhesive strips in place. These skin closures may need to be in place for 2 weeks or longer. If adhesive strip edges start to loosen and curl up, you may trim the loose edges. Do not remove adhesive strips completely unless your health care provider tells you to do that.  Do not take baths, swim, or use a hot tub until your health care provider approves. Ask your health care provider if you can take showers. You may only be allowed to take sponge baths for bathing.  Check your incision area every day for signs of infection. Check for: ? More redness, swelling, or pain. ? More fluid or blood. ? Warmth. ? Pus or a bad smell. Activity  Do not drive or use heavy machinery while taking prescription pain medicine.  Do not lift anything that is heavier than 10 lb (4.5 kg) until your health care provider approves.  Do not play contact sports until your health care provider approves.  Do not  drive for 24 hours if you were given a medicine to help you relax (sedative).  Rest as needed. Do not return to work or school until your health care provider approves. General instructions  Take over-the-counter and prescription medicines only as told by your health care provider.  To prevent or treat constipation while you are taking prescription pain medicine, your health care provider may recommend that you: ? Drink enough fluid to keep your urine clear or pale yellow. ? Take over-the-counter or prescription medicines. ? Eat foods that are high in fiber, such as fresh fruits and vegetables, whole grains, and beans. ? Limit foods that are high in fat  and processed sugars, such as fried and sweet foods. Contact a health care provider if:  You develop a rash.  You have more redness, swelling, or pain around your incisions.  You have more fluid or blood coming from your incisions.  Your incisions feel warm to the touch.  You have pus or a bad smell coming from your incisions.  You have a fever.  One or more of your incisions breaks open. Get help right away if:  You have trouble breathing.  You have chest pain.  You have increasing pain in your shoulders.  You faint or feel dizzy when you stand.  You have severe pain in your abdomen.  You have nausea or vomiting that lasts for more than one day.  You have leg pain. This information is not intended to replace advice given to you by your health care provider. Make sure you discuss any questions you have with your health care provider. Document Revised: 03/12/2017 Document Reviewed: 09/16/2015 Elsevier Patient Education  2020 [employer]  General Anesthesia, Adult, Care After This sheet gives you information about how to care for yourself after your procedure. Your health care provider may also give you more specific instructions. If you have problems or questions, contact your health care provider. What can I expect after the procedure? After the procedure, the following side effects are common:  Pain or discomfort at the IV site.  Nausea.  Vomiting.  Sore throat.  Trouble concentrating.  Feeling cold or chills.  Weak or tired.  Sleepiness and fatigue.  Soreness and body aches. These side effects can affect parts of the body that were not involved in surgery. Follow these instructions at home:  For at least 24 hours after the procedure:  Have a responsible adult stay with you. It is important to have someone help care for you until you are awake and alert.  Rest as needed.  Do not: ? Participate in activities in which you could fall or become  injured. ? Drive. ? Use heavy machinery. ? Drink alcohol. ? Take sleeping pills or medicines that cause drowsiness. ? Make important decisions or sign legal documents. ? Take care of children on your own. Eating and drinking  Follow any instructions from your health care provider about eating or drinking restrictions.  When you feel hungry, start by eating small amounts of foods that are soft and easy to digest (bland), such as toast. Gradually return to your regular diet.  Drink enough fluid to keep your urine pale yellow.  If you vomit, rehydrate by drinking water, juice, or clear broth. General instructions  If you have sleep apnea, surgery and certain medicines can increase your risk for breathing problems. Follow instructions from your health care provider about wearing your sleep device: ? Anytime you are sleeping, including during daytime naps. ?  While taking prescription pain medicines, sleeping medicines, or medicines that make you drowsy.  Return to your normal activities as told by your health care provider. Ask your health care provider what activities are safe for you.  Take over-the-counter and prescription medicines only as told by your health care provider.  If you smoke, do not smoke without supervision.  Keep all follow-up visits as told by your health care provider. This is important. Contact a health care provider if:  You have nausea or vomiting that does not get better with medicine.  You cannot eat or drink without vomiting.  You have pain that does not get better with medicine.  You are unable to pass urine.  You develop a skin rash.  You have a fever.  You have redness around your IV site that gets worse. Get help right away if:  You have difficulty breathing.  You have chest pain.  You have blood in your urine or stool, or you vomit blood. Summary  After the procedure, it is common to have a sore throat or nausea. It is also common to  feel tired.  Have a responsible adult stay with you for the first 24 hours after general anesthesia. It is important to have someone help care for you until you are awake and alert.  When you feel hungry, start by eating small amounts of foods that are soft and easy to digest (bland), such as toast. Gradually return to your regular diet.  Drink enough fluid to keep your urine pale yellow.  Return to your normal activities as told by your health care provider. Ask your health care provider what activities are safe for you. This information is not intended to replace advice given to you by your health care provider. Make sure you discuss any questions you have with your health care provider. Document Revised: 04/02/2017 Document Reviewed: 11/13/2016 Elsevier Patient Education  2020 [employer]. How to Use Chlorhexidine for Bathing Chlorhexidine gluconate (CHG) is a germ-killing (antiseptic) solution that is used to clean the skin. It can get rid of the bacteria that normally live on the skin and can keep them away for about 24 hours. To clean your skin with CHG, you may be given:  A CHG solution to use in the shower or as part of a sponge bath.  A prepackaged cloth that contains CHG. Cleaning your skin with CHG may help lower the risk for infection:  While you are staying in the intensive care unit of the hospital.  If you have a vascular access, such as a central line, to provide short-term or long-term access to your veins.  If you have a catheter to drain urine from your bladder.  If you are on a ventilator. A ventilator is a machine that helps you breathe by moving air in and out of your lungs.  After surgery. What are the risks? Risks of using CHG include:  A skin reaction.  Hearing loss, if CHG gets in your ears.  Eye injury, if CHG gets in your eyes and is not rinsed out.  The CHG product catching fire. Make sure that you avoid smoking and flames after applying CHG to  your skin. Do not use CHG:  If you have a chlorhexidine allergy or have previously reacted to chlorhexidine.  On babies younger than 44 months of age. How to use CHG solution  Use CHG only as told by your health care provider, and follow the instructions on the label.  Use  the full amount of CHG as directed. Usually, this is one bottle. During a shower Follow these steps when using CHG solution during a shower (unless your health care provider gives you different instructions): 1. Start the shower. 2. Use your normal soap and shampoo to wash your face and hair. 3. Turn off the shower or move out of the shower stream. 4. Pour the CHG onto a clean washcloth. Do not use any type of brush or rough-edged sponge. 5. Starting at your neck, lather your body down to your toes. Make sure you follow these instructions: ? If you will be having surgery, pay special attention to the part of your body where you will be having surgery. Scrub this area for at least 1 minute. ? Do not use CHG on your head or face. If the solution gets into your ears or eyes, rinse them well with water. ? Avoid your genital area. ? Avoid any areas of skin that have broken skin, cuts, or scrapes. ? Scrub your back and under your arms. Make sure to wash skin folds. 6. Let the lather sit on your skin for 1-2 minutes or as long as told by your health care provider. 7. Thoroughly rinse your entire body in the shower. Make sure that all body creases and crevices are rinsed well. 8. Dry off with a clean towel. Do not put any substances on your body afterward--such as powder, lotion, or perfume--unless you are told to do so by your health care provider. Only use lotions that are recommended by the manufacturer. 9. Put on clean clothes or pajamas. 10. If it is the night before your surgery, sleep in clean sheets.  During a sponge bath Follow these steps when using CHG solution during a sponge bath (unless your health care provider  gives you different instructions): 1. Use your normal soap and shampoo to wash your face and hair. 2. Pour the CHG onto a clean washcloth. 3. Starting at your neck, lather your body down to your toes. Make sure you follow these instructions: ? If you will be having surgery, pay special attention to the part of your body where you will be having surgery. Scrub this area for at least 1 minute. ? Do not use CHG on your head or face. If the solution gets into your ears or eyes, rinse them well with water. ? Avoid your genital area. ? Avoid any areas of skin that have broken skin, cuts, or scrapes. ? Scrub your back and under your arms. Make sure to wash skin folds. 4. Let the lather sit on your skin for 1-2 minutes or as long as told by your health care provider. 5. Using a different clean, wet washcloth, thoroughly rinse your entire body. Make sure that all body creases and crevices are rinsed well. 6. Dry off with a clean towel. Do not put any substances on your body afterward--such as powder, lotion, or perfume--unless you are told to do so by your health care provider. Only use lotions that are recommended by the manufacturer. 7. Put on clean clothes or pajamas. 8. If it is the night before your surgery, sleep in clean sheets. How to use CHG prepackaged cloths  Only use CHG cloths as told by your health care provider, and follow the instructions on the label.  Use the CHG cloth on clean, dry skin.  Do not use the CHG cloth on your head or face unless your health care provider tells you to.  When washing  with the CHG cloth: ? Avoid your genital area. ? Avoid any areas of skin that have broken skin, cuts, or scrapes. Before surgery Follow these steps when using a CHG cloth to clean before surgery (unless your health care provider gives you different instructions): 1. Using the CHG cloth, vigorously scrub the part of your body where you will be having surgery. Scrub using a back-and-forth  motion for 3 minutes. The area on your body should be completely wet with CHG when you are done scrubbing. 2. Do not rinse. Discard the cloth and let the area air-dry. Do not put any substances on the area afterward, such as powder, lotion, or perfume. 3. Put on clean clothes or pajamas. 4. If it is the night before your surgery, sleep in clean sheets.  For general bathing Follow these steps when using CHG cloths for general bathing (unless your health care provider gives you different instructions). 1. Use a separate CHG cloth for each area of your body. Make sure you wash between any folds of skin and between your fingers and toes. Wash your body in the following order, switching to a new cloth after each step: ? The front of your neck, shoulders, and chest. ? Both of your arms, under your arms, and your hands. ? Your stomach and groin area, avoiding the genitals. ? Your right leg and foot. ? Your left leg and foot. ? The back of your neck, your back, and your buttocks. 2. Do not rinse. Discard the cloth and let the area air-dry. Do not put any substances on your body afterward--such as powder, lotion, or perfume--unless you are told to do so by your health care provider. Only use lotions that are recommended by the manufacturer. 3. Put on clean clothes or pajamas. Contact a health care provider if:  Your skin gets irritated after scrubbing.  You have questions about using your solution or cloth. Get help right away if:  Your eyes become very red or swollen.  Your eyes itch badly.  Your skin itches badly and is red or swollen.  Your hearing changes.  You have trouble seeing.  You have swelling or tingling in your mouth or throat.  You have trouble breathing.  You swallow any chlorhexidine. Summary  Chlorhexidine gluconate (CHG) is a germ-killing (antiseptic) solution that is used to clean the skin. Cleaning your skin with CHG may help to lower your risk for infection.  You  may be given CHG to use for bathing. It may be in a bottle or in a prepackaged cloth to use on your skin. Carefully follow your health care provider's instructions and the instructions on the product label.  Do not use CHG if you have a chlorhexidine allergy.  Contact your health care provider if your skin gets irritated after scrubbing. This information is not intended to replace advice given to you by your health care provider. Make sure you discuss any questions you have with your health care provider. Document Revised: 06/16/2018 Document Reviewed: 02/25/2017 Elsevier Patient Education  2020 [employer].

## 2019-12-07 NOTE — Patient Instructions (Signed)
Laparoscopic Cholecystectomy Laparoscopic cholecystectomy is surgery to remove the gallbladder. The gallbladder is a pear-shaped organ that lies beneath the liver on the right side of the body. The gallbladder stores bile, which is a fluid that helps the body to digest fats. Cholecystectomy is often done for inflammation of the gallbladder (cholecystitis). This condition is usually caused by a buildup of gallstones (cholelithiasis) in the gallbladder. Gallstones can block the flow of bile, which can result in inflammation and pain. In severe cases, emergency surgery may be required. This procedure is done though small incisions in your abdomen (laparoscopic surgery). A thin scope with a camera (laparoscope) is inserted through one incision. Thin surgical instruments are inserted through the other incisions. In some cases, a laparoscopic procedure may be turned into a type of surgery that is done through a larger incision (open surgery). Tell a health care provider about:  Any allergies you have.  All medicines you are taking, including vitamins, herbs, eye drops, creams, and over-the-counter medicines.  Any problems you or family members have had with anesthetic medicines.  Any blood disorders you have.  Any surgeries you have had.  Any medical conditions you have.  Whether you are pregnant or may be pregnant. What are the risks? Generally, this is a safe procedure. However, problems may occur, including:  Infection.  Bleeding.  Allergic reactions to medicines.  Damage to other structures or organs.  A stone remaining in the common bile duct. The common bile duct carries bile from the gallbladder into the small intestine.  A bile leak from the cyst duct that is clipped when your gallbladder is removed. What happens before the procedure?   Medicines  Ask your health care provider about: ? Changing or stopping your regular medicines. This is especially important if you are taking  diabetes medicines or blood thinners. ? Taking medicines such as aspirin and ibuprofen. These medicines can thin your blood. Do not take these medicines before your procedure if your health care provider instructs you not to.  You may be given antibiotic medicine to help prevent infection. General instructions  Let your health care provider know if you develop a cold or an infection before surgery.  Plan to have someone take you home from the hospital or clinic.  Ask your health care provider how your surgical site will be marked or identified. What happens during the procedure?   To reduce your risk of infection: ? Your health care team will wash or sanitize their hands. ? Your skin will be washed with soap. ? Hair may be removed from the surgical area.  An IV tube may be inserted into one of your veins.  You will be given one or more of the following: ? A medicine to help you relax (sedative). ? A medicine to make you fall asleep (general anesthetic).  A breathing tube will be placed in your mouth.  Your surgeon will make several small cuts (incisions) in your abdomen.  The laparoscope will be inserted through one of the small incisions. The camera on the laparoscope will send images to a TV screen (monitor) in the operating room. This lets your surgeon see inside your abdomen.  Air-like gas will be pumped into your abdomen. This will expand your abdomen to give the surgeon more room to perform the surgery.  Other tools that are needed for the procedure will be inserted through the other incisions. The gallbladder will be removed through one of the incisions.  Your common bile duct   may be examined. If stones are found in the common bile duct, they may be removed.  After your gallbladder has been removed, the incisions will be closed with stitches (sutures), staples, or skin glue.  Your incisions may be covered with a bandage (dressing). The procedure may vary among health  care providers and hospitals. What happens after the procedure?  Your blood pressure, heart rate, breathing rate, and blood oxygen level will be monitored until the medicines you were given have worn off.  You will be given medicines as needed to control your pain.  Do not drive for 24 hours if you were given a sedative. This information is not intended to replace advice given to you by your health care provider. Make sure you discuss any questions you have with your health care provider. Document Revised: 03/12/2017 Document Reviewed: 09/16/2015 Elsevier Patient Education  2020 [employer]vier Inc.  

## 2019-12-07 NOTE — Progress Notes (Signed)
Vicki Moore; [id number]; 1980/05/09   HPI Patient is a 39 year old white female who was referred to my care by the hospitalist for evaluation and treatment of right upper quadrant abdominal pain and nausea.  Patient has had multiple admissions to the [facility] health system over the past few months for right upper quadrant abdominal pain and nausea.  Ultrasound the gallbladder revealed a small gallbladder polyp.  No stones were seen.  HIDA scan revealed a normal ejection fraction but reproducible symptoms with a fatty meal.  Patient has had persistent and daily right upper quadrant abdominal pain which radiates around her right flank to her shoulder, nausea, and decreased appetite.  She denies any fever, chills, jaundice.  No medications have been helpful. Past Medical History:  Diagnosis Date  . Asthma     History reviewed. No pertinent surgical history.  History reviewed. No pertinent family history.  Current Outpatient Medications on File Prior to Visit  Medication Sig Dispense Refill  . ADVAIR DISKUS 500-50 MCG/DOSE AEPB Inhale 1 puff into the lungs 2 (two) times daily.    Marland Kitchen albuterol (PROVENTIL HFA;VENTOLIN HFA) 108 (90 BASE) MCG/ACT inhaler Inhale 2 puffs into the lungs every 6 (six) hours as needed. For shortness of breath     . alprazolam (XANAX) 2 MG tablet Take 2 mg by mouth 3 (three) times daily as needed for anxiety.    Marland Kitchen alum & mag hydroxide-simeth (MAALOX/MYLANTA) 200-200-20 MG/5ML suspension Take 15 mLs by mouth every 6 (six) hours as needed for indigestion or heartburn.    . dicyclomine (BENTYL) 20 MG tablet Take 1 tablet (20 mg total) by mouth 2 (two) times daily. (Patient taking differently: Take 20 mg by mouth 2 (two) times daily as needed for spasms. ) 20 tablet 0  . ferrous sulfate 325 (65 FE) MG tablet Take 1 tablet (325 mg total) by mouth daily with breakfast. 30 tablet 0  . ibuprofen (ADVIL) 200 MG tablet Take 400 mg by mouth every 6 (six) hours as needed for headache or  mild pain.    Marland Kitchen ipratropium-albuterol (DUONEB) 0.5-2.5 (3) MG/3ML SOLN Take 3 mLs by nebulization 4 (four) times daily as needed for wheezing or shortness of breath.    . pantoprazole (PROTONIX) 40 MG tablet Take 1 tablet (40 mg total) by mouth 2 (two) times daily before a meal. 60 tablet 0  . senna-docusate (SENOKOT-S) 8.6-50 MG tablet Take 2 tablets by mouth at bedtime as needed for mild constipation or moderate constipation. 30 tablet 0   No current facility-administered medications on file prior to visit.    Allergies  Allergen Reactions  . Other Shortness Of Breath and Hives  . Peanut-Containing Drug Products Hives and Shortness Of Breath  . Penicillins Hives, Nausea And Vomiting and Anaphylaxis    Other reaction(s): RASH Other reaction(s): RASH     Social History   Substance and Sexual Activity  Alcohol Use Yes    Social History   Tobacco Use  Smoking Status Current Some Day Smoker  . Packs/day: 0.25  Smokeless Tobacco Never Used    Review of Systems  Constitutional: Positive for malaise/fatigue.  HENT: Positive for sinus pain.   Eyes: Positive for blurred vision, double vision and pain.  Respiratory: Positive for shortness of breath and wheezing.   Cardiovascular: Negative.   Gastrointestinal: Positive for abdominal pain, heartburn, nausea and vomiting.  Genitourinary: Positive for dysuria and frequency. Negative for urgency.  Musculoskeletal: Positive for back pain, joint pain and neck pain.  Skin: Negative.  Neurological: Positive for headaches.  Endo/Heme/Allergies: Negative.   Psychiatric/Behavioral: The patient is nervous/anxious.     Objective   Vitals:   12/07/19 0916  BP: 112/62  Pulse: (!) 107  Resp: 12  Temp: 98.4 F (36.9 C)  SpO2: 95%    Physical Exam Vitals reviewed.  Constitutional:      Appearance: Normal appearance. She is not ill-appearing.  HENT:     Head: Normocephalic and atraumatic.  Eyes:     General: No scleral  icterus. Cardiovascular:     Rate and Rhythm: Normal rate and regular rhythm.     Heart sounds: Normal heart sounds. No murmur heard.  No friction rub. No gallop.   Pulmonary:     Effort: Pulmonary effort is normal. No respiratory distress.     Breath sounds: Normal breath sounds. No stridor. No wheezing, rhonchi or rales.  Abdominal:     General: There is no distension.     Palpations: Abdomen is soft. There is no mass.     Tenderness: There is abdominal tenderness. There is guarding. There is no rebound.     Hernia: No hernia is present.  Skin:    General: Skin is warm and dry.  Neurological:     Mental Status: She is alert and oriented to person, place, and time.    Previous admissions reviewed, labs reviewed Assessment  Biliary colic Gallbladder polyp too small to be of significance Plan   Patient is scheduled for laparoscopic cholecystectomy on 12/08/2019.  The risks and benefits of the procedure including bleeding, infection, hepatobiliary injury, and the possibility of an open procedure were fully explained to the patient, who gave informed consent.  I told the patient that I could not give her 100% guarantee that all of her symptoms would be resolved with cholecystectomy.  She understands this.  She would like to proceed with the surgery.

## 2019-12-07 NOTE — H&P (Signed)
Vicki Moore; [id number]; 1980/05/09   HPI Patient is a 39 year old white female who was referred to my care by the hospitalist for evaluation and treatment of right upper quadrant abdominal pain and nausea.  Patient has had multiple admissions to the [facility] health system over the past few months for right upper quadrant abdominal pain and nausea.  Ultrasound the gallbladder revealed a small gallbladder polyp.  No stones were seen.  HIDA scan revealed a normal ejection fraction but reproducible symptoms with a fatty meal.  Patient has had persistent and daily right upper quadrant abdominal pain which radiates around her right flank to her shoulder, nausea, and decreased appetite.  She denies any fever, chills, jaundice.  No medications have been helpful. Past Medical History:  Diagnosis Date  . Asthma     History reviewed. No pertinent surgical history.  History reviewed. No pertinent family history.  Current Outpatient Medications on File Prior to Visit  Medication Sig Dispense Refill  . ADVAIR DISKUS 500-50 MCG/DOSE AEPB Inhale 1 puff into the lungs 2 (two) times daily.    Marland Kitchen albuterol (PROVENTIL HFA;VENTOLIN HFA) 108 (90 BASE) MCG/ACT inhaler Inhale 2 puffs into the lungs every 6 (six) hours as needed. For shortness of breath     . alprazolam (XANAX) 2 MG tablet Take 2 mg by mouth 3 (three) times daily as needed for anxiety.    Marland Kitchen alum & mag hydroxide-simeth (MAALOX/MYLANTA) 200-200-20 MG/5ML suspension Take 15 mLs by mouth every 6 (six) hours as needed for indigestion or heartburn.    . dicyclomine (BENTYL) 20 MG tablet Take 1 tablet (20 mg total) by mouth 2 (two) times daily. (Patient taking differently: Take 20 mg by mouth 2 (two) times daily as needed for spasms. ) 20 tablet 0  . ferrous sulfate 325 (65 FE) MG tablet Take 1 tablet (325 mg total) by mouth daily with breakfast. 30 tablet 0  . ibuprofen (ADVIL) 200 MG tablet Take 400 mg by mouth every 6 (six) hours as needed for headache or  mild pain.    Marland Kitchen ipratropium-albuterol (DUONEB) 0.5-2.5 (3) MG/3ML SOLN Take 3 mLs by nebulization 4 (four) times daily as needed for wheezing or shortness of breath.    . pantoprazole (PROTONIX) 40 MG tablet Take 1 tablet (40 mg total) by mouth 2 (two) times daily before a meal. 60 tablet 0  . senna-docusate (SENOKOT-S) 8.6-50 MG tablet Take 2 tablets by mouth at bedtime as needed for mild constipation or moderate constipation. 30 tablet 0   No current facility-administered medications on file prior to visit.    Allergies  Allergen Reactions  . Other Shortness Of Breath and Hives  . Peanut-Containing Drug Products Hives and Shortness Of Breath  . Penicillins Hives, Nausea And Vomiting and Anaphylaxis    Other reaction(s): RASH Other reaction(s): RASH     Social History   Substance and Sexual Activity  Alcohol Use Yes    Social History   Tobacco Use  Smoking Status Current Some Day Smoker  . Packs/day: 0.25  Smokeless Tobacco Never Used    Review of Systems  Constitutional: Positive for malaise/fatigue.  HENT: Positive for sinus pain.   Eyes: Positive for blurred vision, double vision and pain.  Respiratory: Positive for shortness of breath and wheezing.   Cardiovascular: Negative.   Gastrointestinal: Positive for abdominal pain, heartburn, nausea and vomiting.  Genitourinary: Positive for dysuria and frequency. Negative for urgency.  Musculoskeletal: Positive for back pain, joint pain and neck pain.  Skin: Negative.  Neurological: Positive for headaches.  Endo/Heme/Allergies: Negative.   Psychiatric/Behavioral: The patient is nervous/anxious.     Objective   Vitals:   12/07/19 0916  BP: 112/62  Pulse: (!) 107  Resp: 12  Temp: 98.4 F (36.9 C)  SpO2: 95%    Physical Exam Vitals reviewed.  Constitutional:      Appearance: Normal appearance. She is not ill-appearing.  HENT:     Head: Normocephalic and atraumatic.  Eyes:     General: No scleral  icterus. Cardiovascular:     Rate and Rhythm: Normal rate and regular rhythm.     Heart sounds: Normal heart sounds. No murmur heard.  No friction rub. No gallop.   Pulmonary:     Effort: Pulmonary effort is normal. No respiratory distress.     Breath sounds: Normal breath sounds. No stridor. No wheezing, rhonchi or rales.  Abdominal:     General: There is no distension.     Palpations: Abdomen is soft. There is no mass.     Tenderness: There is abdominal tenderness. There is guarding. There is no rebound.     Hernia: No hernia is present.  Skin:    General: Skin is warm and dry.  Neurological:     Mental Status: She is alert and oriented to person, place, and time.    Previous admissions reviewed, labs reviewed Assessment  Biliary colic Gallbladder polyp too small to be of significance Plan   Patient is scheduled for laparoscopic cholecystectomy on 12/08/2019.  The risks and benefits of the procedure including bleeding, infection, hepatobiliary injury, and the possibility of an open procedure were fully explained to the patient, who gave informed consent.  I told the patient that I could not give her 100% guarantee that all of her symptoms would be resolved with cholecystectomy.  She understands this.  She would like to proceed with the surgery. 

## 2019-12-08 ENCOUNTER — Encounter (HOSPITAL_COMMUNITY)
Admission: RE | Disposition: A | Discharge status: Home / Self Care | Payer: Self-pay | Source: Home / Self Care | Attending: General Surgery

## 2019-12-08 ENCOUNTER — Encounter (HOSPITAL_COMMUNITY): Admission: RE | Admit: 2019-12-08 | Payer: Medicaid Other | Source: Ambulatory Visit

## 2019-12-08 ENCOUNTER — Encounter (HOSPITAL_COMMUNITY): Payer: Self-pay | Admitting: General Surgery

## 2019-12-08 ENCOUNTER — Ambulatory Visit (HOSPITAL_COMMUNITY): Payer: Medicaid Other | Admitting: Certified Registered"

## 2019-12-08 ENCOUNTER — Ambulatory Visit (HOSPITAL_COMMUNITY)
Admission: RE | Admit: 2019-12-08 | Discharge: 2019-12-08 | Disposition: A | Discharge status: Home / Self Care | Payer: Medicaid Other | Attending: General Surgery | Admitting: General Surgery

## 2019-12-08 DIAGNOSIS — K8050 Calculus of bile duct without cholangitis or cholecystitis without obstruction: Secondary | ICD-10-CM

## 2019-12-08 DIAGNOSIS — K811 Chronic cholecystitis: Secondary | ICD-10-CM | POA: Insufficient documentation

## 2019-12-08 DIAGNOSIS — K828 Other specified diseases of gallbladder: Secondary | ICD-10-CM | POA: Diagnosis not present

## 2019-12-08 DIAGNOSIS — Z7951 Long term (current) use of inhaled steroids: Secondary | ICD-10-CM | POA: Insufficient documentation

## 2019-12-08 DIAGNOSIS — K219 Gastro-esophageal reflux disease without esophagitis: Secondary | ICD-10-CM | POA: Diagnosis not present

## 2019-12-08 DIAGNOSIS — J45909 Unspecified asthma, uncomplicated: Secondary | ICD-10-CM | POA: Insufficient documentation

## 2019-12-08 DIAGNOSIS — F419 Anxiety disorder, unspecified: Secondary | ICD-10-CM | POA: Diagnosis not present

## 2019-12-08 DIAGNOSIS — F17210 Nicotine dependence, cigarettes, uncomplicated: Secondary | ICD-10-CM | POA: Insufficient documentation

## 2019-12-08 DIAGNOSIS — Z79899 Other long term (current) drug therapy: Secondary | ICD-10-CM | POA: Diagnosis not present

## 2019-12-08 HISTORY — PX: CHOLECYSTECTOMY: SHX55

## 2019-12-08 LAB — SARS CORONAVIRUS 2 (TAT 6-24 HRS): SARS Coronavirus 2: NEGATIVE

## 2019-12-08 SURGERY — LAPAROSCOPIC CHOLECYSTECTOMY
Anesthesia: General | Site: Abdomen

## 2019-12-08 MED ORDER — MIDAZOLAM HCL 2 MG/2ML IJ SOLN
INTRAMUSCULAR | Status: AC
  Filled 2019-12-08: qty 2

## 2019-12-08 MED ORDER — HYDROMORPHONE HCL 1 MG/ML IJ SOLN
INTRAMUSCULAR | Status: AC
  Filled 2019-12-08: qty 0.5

## 2019-12-08 MED ORDER — DEXAMETHASONE SODIUM PHOSPHATE 10 MG/ML IJ SOLN
INTRAMUSCULAR | Status: AC
  Filled 2019-12-08: qty 1

## 2019-12-08 MED ORDER — ONDANSETRON HCL 4 MG/2ML IJ SOLN
INTRAMUSCULAR | Status: DC | PRN
  Administered 2019-12-08: 4 mg via INTRAVENOUS

## 2019-12-08 MED ORDER — LIDOCAINE 2% (20 MG/ML) 5 ML SYRINGE
INTRAMUSCULAR | Status: AC
  Filled 2019-12-08: qty 5

## 2019-12-08 MED ORDER — CIPROFLOXACIN IN D5W 400 MG/200ML IV SOLN
INTRAVENOUS | Status: AC
  Filled 2019-12-08: qty 200

## 2019-12-08 MED ORDER — ROCURONIUM BROMIDE 100 MG/10ML IV SOLN
INTRAVENOUS | Status: DC | PRN
  Administered 2019-12-08: 40 mg via INTRAVENOUS

## 2019-12-08 MED ORDER — CHLORHEXIDINE GLUCONATE 0.12 % MT SOLN
OROMUCOSAL | Status: AC
  Filled 2019-12-08: qty 15

## 2019-12-08 MED ORDER — CHLORHEXIDINE GLUCONATE CLOTH 2 % EX PADS: 6.0000 | MEDICATED_PAD | Freq: Once | CUTANEOUS | Status: DC

## 2019-12-08 MED ORDER — DEXAMETHASONE SODIUM PHOSPHATE 10 MG/ML IJ SOLN
INTRAMUSCULAR | Status: DC | PRN
  Administered 2019-12-08: 10 mg via INTRAVENOUS

## 2019-12-08 MED ORDER — ONDANSETRON HCL 4 MG PO TABS: 4.0000 mg | ORAL_TABLET | Freq: Three times a day (TID) | ORAL | 0 refills | Status: DC | PRN

## 2019-12-08 MED ORDER — OXYCODONE-ACETAMINOPHEN 10-325 MG PO TABS
1.0000 | ORAL_TABLET | Freq: Four times a day (QID) | ORAL | 0 refills | Status: DC | PRN
Start: 2019-12-08 — End: 2019-12-19

## 2019-12-08 MED ORDER — ROCURONIUM BROMIDE 10 MG/ML (PF) SYRINGE
PREFILLED_SYRINGE | INTRAVENOUS | Status: AC
  Filled 2019-12-08: qty 10

## 2019-12-08 MED ORDER — PROPOFOL 10 MG/ML IV BOLUS
INTRAVENOUS | Status: DC | PRN
  Administered 2019-12-08: 200 mg via INTRAVENOUS

## 2019-12-08 MED ORDER — HYDROMORPHONE HCL 1 MG/ML IJ SOLN
0.2500 mg | INTRAMUSCULAR | Status: DC | PRN
  Administered 2019-12-08 (×4): 0.5 mg via INTRAVENOUS
  Filled 2019-12-08 (×4): qty 0.5

## 2019-12-08 MED ORDER — KETOROLAC TROMETHAMINE 30 MG/ML IJ SOLN: 30.0000 mg | Freq: Once | INTRAMUSCULAR | Status: DC

## 2019-12-08 MED ORDER — CIPROFLOXACIN IN D5W 400 MG/200ML IV SOLN
400.0000 mg | INTRAVENOUS | Status: AC
  Administered 2019-12-08: 400 mg via INTRAVENOUS

## 2019-12-08 MED ORDER — BUPIVACAINE LIPOSOME 1.3 % IJ SUSP
INTRAMUSCULAR | Status: DC | PRN
  Administered 2019-12-08: 20 mL

## 2019-12-08 MED ORDER — BUPIVACAINE LIPOSOME 1.3 % IJ SUSP
INTRAMUSCULAR | Status: AC
  Filled 2019-12-08: qty 20

## 2019-12-08 MED ORDER — LIDOCAINE HCL (CARDIAC) PF 50 MG/5ML IV SOSY
PREFILLED_SYRINGE | INTRAVENOUS | Status: DC | PRN
  Administered 2019-12-08: 100 mg via INTRAVENOUS

## 2019-12-08 MED ORDER — ORAL CARE MOUTH RINSE: 15.0000 mL | Freq: Once | OROMUCOSAL | Status: AC

## 2019-12-08 MED ORDER — FENTANYL CITRATE (PF) 100 MCG/2ML IJ SOLN
INTRAMUSCULAR | Status: DC | PRN
Start: 2019-12-08 — End: 2019-12-08
  Administered 2019-12-08: 50 ug via INTRAVENOUS
  Administered 2019-12-08: 150 ug via INTRAVENOUS
  Administered 2019-12-08: 50 ug via INTRAVENOUS

## 2019-12-08 MED ORDER — ONDANSETRON HCL 4 MG/2ML IJ SOLN
INTRAMUSCULAR | Status: AC
  Filled 2019-12-08: qty 2

## 2019-12-08 MED ORDER — HEMOSTATIC AGENTS (NO CHARGE) OPTIME
TOPICAL | Status: DC | PRN
  Administered 2019-12-08: 1 via TOPICAL

## 2019-12-08 MED ORDER — HYDROMORPHONE HCL 1 MG/ML IJ SOLN
0.2500 mg | INTRAMUSCULAR | Status: DC | PRN
  Administered 2019-12-08 (×2): 0.5 mg via INTRAVENOUS

## 2019-12-08 MED ORDER — ONDANSETRON HCL 4 MG/2ML IJ SOLN: 4.0000 mg | Freq: Once | INTRAMUSCULAR | Status: DC | PRN

## 2019-12-08 MED ORDER — MIDAZOLAM HCL 2 MG/2ML IJ SOLN
0.5000 mg | Freq: Once | INTRAMUSCULAR | Status: AC | PRN
  Administered 2019-12-08: 2 mg via INTRAVENOUS
  Filled 2019-12-08: qty 2

## 2019-12-08 MED ORDER — LACTATED RINGERS IV SOLN
INTRAVENOUS | Status: DC
  Administered 2019-12-08: 1000 mL via INTRAVENOUS

## 2019-12-08 MED ORDER — KETOROLAC TROMETHAMINE 30 MG/ML IJ SOLN
INTRAMUSCULAR | Status: AC
  Filled 2019-12-08: qty 1

## 2019-12-08 MED ORDER — SUGAMMADEX SODIUM 500 MG/5ML IV SOLN
INTRAVENOUS | Status: DC | PRN
  Administered 2019-12-08: 200 mg via INTRAVENOUS

## 2019-12-08 MED ORDER — FENTANYL CITRATE (PF) 250 MCG/5ML IJ SOLN
INTRAMUSCULAR | Status: AC
  Filled 2019-12-08: qty 5

## 2019-12-08 MED ORDER — SUCCINYLCHOLINE CHLORIDE 200 MG/10ML IV SOSY
PREFILLED_SYRINGE | INTRAVENOUS | Status: AC
  Filled 2019-12-08: qty 10

## 2019-12-08 MED ORDER — SUCCINYLCHOLINE CHLORIDE 20 MG/ML IJ SOLN
INTRAMUSCULAR | Status: DC | PRN
  Administered 2019-12-08: 160 mg via INTRAVENOUS

## 2019-12-08 MED ORDER — CHLORHEXIDINE GLUCONATE 0.12 % MT SOLN
15.0000 mL | Freq: Once | OROMUCOSAL | Status: AC
  Administered 2019-12-08: 15 mL via OROMUCOSAL

## 2019-12-08 MED ORDER — KETOROLAC TROMETHAMINE 30 MG/ML IJ SOLN
INTRAMUSCULAR | Status: DC | PRN
  Administered 2019-12-08: 30 mg via INTRAVENOUS

## 2019-12-08 MED ORDER — SODIUM CHLORIDE 0.9 % IR SOLN
Status: DC | PRN
  Administered 2019-12-08: 1000 mL

## 2019-12-08 MED ORDER — PROPOFOL 10 MG/ML IV BOLUS
INTRAVENOUS | Status: AC
  Filled 2019-12-08: qty 20

## 2019-12-08 MED ORDER — MIDAZOLAM HCL 2 MG/2ML IJ SOLN
INTRAMUSCULAR | Status: DC | PRN
  Administered 2019-12-08: 2 mg via INTRAVENOUS

## 2019-12-08 SURGICAL SUPPLY — 52 items
ADH SKN CLS APL DERMABOND .7 (GAUZE/BANDAGES/DRESSINGS) ×1
APL SRG 38 LTWT LNG FL B (MISCELLANEOUS)
APPLICATOR ARISTA FLEXITIP XL (MISCELLANEOUS) IMPLANT
APPLIER CLIP ROT 10 11.4 M/L (STAPLE) ×2
APR CLP MED LRG 11.4X10 (STAPLE) ×1
BAG RETRIEVAL 10 (BASKET) ×1
CLIP APPLIE ROT 10 11.4 M/L (STAPLE) ×1 IMPLANT
CLOTH BEACON ORANGE TIMEOUT ST (SAFETY) ×2 IMPLANT
COVER LIGHT HANDLE STERIS (MISCELLANEOUS) ×4 IMPLANT
COVER WAND RF STERILE (DRAPES) ×2 IMPLANT
DERMABOND ADVANCED (GAUZE/BANDAGES/DRESSINGS) ×1
DERMABOND ADVANCED .7 DNX12 (GAUZE/BANDAGES/DRESSINGS) ×1 IMPLANT
DURAPREP 26ML APPLICATOR (WOUND CARE) ×2 IMPLANT
ELECT REM PT RETURN 9FT ADLT (ELECTROSURGICAL) ×2
ELECTRODE REM PT RTRN 9FT ADLT (ELECTROSURGICAL) ×1 IMPLANT
GLOVE BIOGEL M STRL SZ7.5 (GLOVE) ×1 IMPLANT
GLOVE BIOGEL PI IND STRL 7.0 (GLOVE) ×2 IMPLANT
GLOVE BIOGEL PI IND STRL 7.5 (GLOVE) IMPLANT
GLOVE BIOGEL PI IND STRL 8 (GLOVE) IMPLANT
GLOVE BIOGEL PI INDICATOR 7.0 (GLOVE) ×2
GLOVE BIOGEL PI INDICATOR 7.5 (GLOVE) ×1
GLOVE BIOGEL PI INDICATOR 8 (GLOVE) ×1
GLOVE SS BIOGEL STRL SZ 8 (GLOVE) IMPLANT
GLOVE SUPERSENSE BIOGEL SZ 8 (GLOVE) ×1
GLOVE SURG SS PI 7.5 STRL IVOR (GLOVE) ×2 IMPLANT
GOWN STRL REUS W/TWL LRG LVL3 (GOWN DISPOSABLE) ×6 IMPLANT
HEMOSTAT ARISTA ABSORB 3G PWDR (HEMOSTASIS) IMPLANT
HEMOSTAT SNOW SURGICEL 2X4 (HEMOSTASIS) ×2 IMPLANT
INST SET LAPROSCOPIC AP (KITS) ×2 IMPLANT
KIT TURNOVER KIT A (KITS) ×2 IMPLANT
MANIFOLD NEPTUNE II (INSTRUMENTS) ×2 IMPLANT
NDL HYPO 18GX1.5 BLUNT FILL (NEEDLE) ×1 IMPLANT
NDL INSUFFLATION 14GA 120MM (NEEDLE) ×1 IMPLANT
NEEDLE HYPO 18GX1.5 BLUNT FILL (NEEDLE) ×2 IMPLANT
NEEDLE HYPO 22GX1.5 SAFETY (NEEDLE) ×2 IMPLANT
NEEDLE INSUFFLATION 14GA 120MM (NEEDLE) ×2 IMPLANT
NS IRRIG 1000ML POUR BTL (IV SOLUTION) ×2 IMPLANT
PACK LAP CHOLE LZT030E (CUSTOM PROCEDURE TRAY) ×2 IMPLANT
PAD ARMBOARD 7.5X6 YLW CONV (MISCELLANEOUS) ×2 IMPLANT
SET BASIN LINEN APH (SET/KITS/TRAYS/PACK) ×2 IMPLANT
SET TUBE SMOKE EVAC HIGH FLOW (TUBING) ×2 IMPLANT
SLEEVE ENDOPATH XCEL 5M (ENDOMECHANICALS) ×2 IMPLANT
SUT MNCRL AB 4-0 PS2 18 (SUTURE) ×4 IMPLANT
SUT VICRYL 0 UR6 27IN ABS (SUTURE) ×2 IMPLANT
SYR 20ML LL LF (SYRINGE) ×4 IMPLANT
SYS BAG RETRIEVAL 10MM (BASKET) ×1
SYSTEM BAG RETRIEVAL 10MM (BASKET) ×1 IMPLANT
TROCAR ENDO BLADELESS 11MM (ENDOMECHANICALS) ×2 IMPLANT
TROCAR XCEL NON-BLD 5MMX100MML (ENDOMECHANICALS) ×2 IMPLANT
TROCAR XCEL UNIV SLVE 11M 100M (ENDOMECHANICALS) ×2 IMPLANT
TUBE CONNECTING 12X1/4 (SUCTIONS) ×2 IMPLANT
WARMER LAPAROSCOPE (MISCELLANEOUS) ×2 IMPLANT

## 2019-12-08 NOTE — Interval H&P Note (Signed)
History and Physical Interval Note:  12/08/2019 7:51 AM  Vicki Moore  has presented today for surgery, with the diagnosis of Biliary colic.  The various methods of treatment have been discussed with the patient and family. After consideration of risks, benefits and other options for treatment, the patient has consented to  Procedure(s): LAPAROSCOPIC CHOLECYSTECTOMY (N/A) as a surgical intervention.  The patient's history has been reviewed, patient examined, no change in status, stable for surgery.  I have reviewed the patient's chart and labs.  Questions were answered to the patient's satisfaction.     Franky Macho

## 2019-12-08 NOTE — Transfer of Care (Signed)
Immediate Anesthesia Transfer of Care Note  Patient: Vicki Moore  Procedure(s) Performed: LAPAROSCOPIC CHOLECYSTECTOMY (N/A Abdomen)  Patient Location: PACU  Anesthesia Type:General  Level of Consciousness: awake, alert , oriented and patient cooperative  Airway & Oxygen Therapy: Patient Spontanous Breathing  Post-op Assessment: Report given to RN, Post -op Vital signs reviewed and stable and Patient moving all extremities  Post vital signs: Reviewed and stable  Last Vitals:  Vitals Value Taken Time  BP    Temp    Pulse 94 12/08/19 0909  Resp 12 12/08/19 0909  SpO2 95 % 12/08/19 0909  Vitals shown include unvalidated device data.  Last Pain:  Vitals:   12/08/19 0738  TempSrc: Oral  PainSc: 0-No pain      Patients Stated Pain Goal: 8 (12/08/19 6979)  Complications: No complications documented.

## 2019-12-08 NOTE — Discharge Instructions (Signed)
Laparoscopic Cholecystectomy, Care After This sheet gives you information about how to care for yourself after your procedure. Your health care provider may also give you more specific instructions. If you have problems or questions, contact your health care provider. What can I expect after the procedure? After the procedure, it is common to have:  Pain at your incision sites. You will be given medicines to control this pain.  Mild nausea or vomiting.  Bloating and possible shoulder pain from the air-like gas that was used during the procedure. Follow these instructions at home: Incision care   Follow instructions from your health care provider about how to take care of your incisions. Make sure you: ? Wash your hands with soap and water before you change your bandage (dressing). If soap and water are not available, use hand sanitizer. ? Change your dressing as told by your health care provider. ? Leave stitches (sutures), skin glue, or adhesive strips in place. These skin closures may need to be in place for 2 weeks or longer. If adhesive strip edges start to loosen and curl up, you may trim the loose edges. Do not remove adhesive strips completely unless your health care provider tells you to do that.  Do not take baths, swim, or use a hot tub until your health care provider approves. Ask your health care provider if you can take showers. You may only be allowed to take sponge baths for bathing.  Check your incision area every day for signs of infection. Check for: ? More redness, swelling, or pain. ? More fluid or blood. ? Warmth. ? Pus or a bad smell. Activity  Do not drive or use heavy machinery while taking prescription pain medicine.  Do not lift anything that is heavier than 10 lb (4.5 kg) until your health care provider approves.  Do not play contact sports until your health care provider approves.  Do not drive for 24 hours if you were given a medicine to help you relax  (sedative).  Rest as needed. Do not return to work or school until your health care provider approves. General instructions  Take over-the-counter and prescription medicines only as told by your health care provider.  To prevent or treat constipation while you are taking prescription pain medicine, your health care provider may recommend that you: ? Drink enough fluid to keep your urine clear or pale yellow. ? Take over-the-counter or prescription medicines. ? Eat foods that are high in fiber, such as fresh fruits and vegetables, whole grains, and beans. ? Limit foods that are high in fat and processed sugars, such as fried and sweet foods. Contact a health care provider if:  You develop a rash.  You have more redness, swelling, or pain around your incisions.  You have more fluid or blood coming from your incisions.  Your incisions feel warm to the touch.  You have pus or a bad smell coming from your incisions.  You have a fever.  One or more of your incisions breaks open. Get help right away if:  You have trouble breathing.  You have chest pain.  You have increasing pain in your shoulders.  You faint or feel dizzy when you stand.  You have severe pain in your abdomen.  You have nausea or vomiting that lasts for more than one day.  You have leg pain. This information is not intended to replace advice given to you by your health care provider. Make sure you discuss any questions you have   with your health care provider. Document Revised: 03/12/2017 Document Reviewed: 09/16/2015 Elsevier Patient Education  2020 [employer]   General Anesthesia, Adult, Care After This sheet gives you information about how to care for yourself after your procedure. Your health care provider may also give you more specific instructions. If you have problems or questions, contact your health care provider. What can I expect after the procedure? After the procedure, the following side  effects are common:  Pain or discomfort at the IV site.  Nausea.  Vomiting.  Sore throat.  Trouble concentrating.  Feeling cold or chills.  Weak or tired.  Sleepiness and fatigue.  Soreness and body aches. These side effects can affect parts of the body that were not involved in surgery. Follow these instructions at home:  For at least 24 hours after the procedure:  Have a responsible adult stay with you. It is important to have someone help care for you until you are awake and alert.  Rest as needed.  Do not: ? Participate in activities in which you could fall or become injured. ? Drive. ? Use heavy machinery. ? Drink alcohol. ? Take sleeping pills or medicines that cause drowsiness. ? Make important decisions or sign legal documents. ? Take care of children on your own. Eating and drinking  Follow any instructions from your health care provider about eating or drinking restrictions.  When you feel hungry, start by eating small amounts of foods that are soft and easy to digest (bland), such as toast. Gradually return to your regular diet.  Drink enough fluid to keep your urine pale yellow.  If you vomit, rehydrate by drinking water, juice, or clear broth. General instructions  If you have sleep apnea, surgery and certain medicines can increase your risk for breathing problems. Follow instructions from your health care provider about wearing your sleep device: ? Anytime you are sleeping, including during daytime naps. ? While taking prescription pain medicines, sleeping medicines, or medicines that make you drowsy.  Return to your normal activities as told by your health care provider. Ask your health care provider what activities are safe for you.  Take over-the-counter and prescription medicines only as told by your health care provider.  If you smoke, do not smoke without supervision.  Keep all follow-up visits as told by your health care provider. This is  important. Contact a health care provider if:  You have nausea or vomiting that does not get better with medicine.  You cannot eat or drink without vomiting.  You have pain that does not get better with medicine.  You are unable to pass urine.  You develop a skin rash.  You have a fever.  You have redness around your IV site that gets worse. Get help right away if:  You have difficulty breathing.  You have chest pain.  You have blood in your urine or stool, or you vomit blood. Summary  After the procedure, it is common to have a sore throat or nausea. It is also common to feel tired.  Have a responsible adult stay with you for the first 24 hours after general anesthesia. It is important to have someone help care for you until you are awake and alert.  When you feel hungry, start by eating small amounts of foods that are soft and easy to digest (bland), such as toast. Gradually return to your regular diet.  Drink enough fluid to keep your urine pale yellow.  Return to your normal activities as   told by your health care provider. Ask your health care provider what activities are safe for you. This information is not intended to replace advice given to you by your health care provider. Make sure you discuss any questions you have with your health care provider. Document Revised: 04/02/2017 Document Reviewed: 11/13/2016 Elsevier Patient Education  2020 Else[employer] Inc.  

## 2019-12-08 NOTE — Anesthesia Procedure Notes (Signed)
Procedure Name: Intubation °Performed by: Jossue Rubenstein L, CRNA °Pre-anesthesia Checklist: Patient identified, Emergency Drugs available, Suction available, Patient being monitored and Timeout performed °Patient Re-evaluated:Patient Re-evaluated prior to induction °Oxygen Delivery Method: Circle system utilized °Preoxygenation: Pre-oxygenation with 100% oxygen °Induction Type: IV induction °Laryngoscope Size: Miller and 2 °Grade View: Grade I °Tube size: 7.0 mm °Number of attempts: 1 °Airway Equipment and Method: Stylet °Placement Confirmation: ETT inserted through vocal cords under direct vision,  positive ETCO2,  CO2 detector and breath sounds checked- equal and bilateral °Secured at: 21 cm °Tube secured with: Tape °Dental Injury: Teeth and Oropharynx as per pre-operative assessment  ° ° ° ° ° ° °

## 2019-12-08 NOTE — Anesthesia Postprocedure Evaluation (Signed)
Anesthesia Post Note  Patient: Vicki Moore  Procedure(s) Performed: LAPAROSCOPIC CHOLECYSTECTOMY (N/A Abdomen)  Patient location during evaluation: PACU Anesthesia Type: General Level of consciousness: awake, oriented, awake and alert and patient cooperative Pain management: pain level controlled Vital Signs Assessment: post-procedure vital signs reviewed and stable Respiratory status: spontaneous breathing, respiratory function stable and nonlabored ventilation Cardiovascular status: blood pressure returned to baseline and stable Postop Assessment: no headache and no backache Anesthetic complications: no   No complications documented.   Last Vitals:  Vitals:   12/08/19 0738  BP: (!) 147/90  Resp: 18  Temp: 36.9 C  SpO2: 96%    Last Pain:  Vitals:   12/08/19 0738  TempSrc: Oral  PainSc: 0-No pain                 Brynda Peon

## 2019-12-08 NOTE — Anesthesia Preprocedure Evaluation (Signed)
Anesthesia Evaluation  Patient identified by MRN, date of birth, ID band Patient awake    Reviewed: Allergy & Precautions, H&P , NPO status , Patient's Chart, lab work & pertinent test results, reviewed documented beta blocker date and time   Airway Mallampati: III  TM Distance: >3 FB Neck ROM: full    Dental no notable dental hx. (+) Teeth Intact   Pulmonary asthma , pneumonia, resolved, Current Smoker and Patient abstained from smoking.,    Pulmonary exam normal breath sounds clear to auscultation       Cardiovascular Exercise Tolerance: Good negative cardio ROS   Rhythm:regular Rate:Normal     Neuro/Psych PSYCHIATRIC DISORDERS Anxiety negative neurological ROS     GI/Hepatic Neg liver ROS, GERD  Medicated,  Endo/Other  negative endocrine ROS  Renal/GU negative Renal ROS  negative genitourinary   Musculoskeletal   Abdominal   Peds  Hematology negative hematology ROS (+)   Anesthesia Other Findings   Reproductive/Obstetrics negative OB ROS                             Anesthesia Physical Anesthesia Plan  ASA: II  Anesthesia Plan: General   Post-op Pain Management:    Induction:   PONV Risk Score and Plan: Ondansetron  Airway Management Planned:   Additional Equipment:   Intra-op Plan:   Post-operative Plan:   Informed Consent: I have reviewed the patients History and Physical, chart, labs and discussed the procedure including the risks, benefits and alternatives for the proposed anesthesia with the patient or authorized representative who has indicated his/her understanding and acceptance.     Dental Advisory Given  Plan Discussed with: CRNA  Anesthesia Plan Comments:         Anesthesia Quick Evaluation

## 2019-12-08 NOTE — Op Note (Signed)
Patient:  Vicki Moore  DOB:  1980/09/07  MRN:  [id number]   Preop Diagnosis: Biliary colic  Postop Diagnosis: Same  Procedure: Laparoscopic cholecystectomy  Surgeon: Franky Macho, MD  Anes: General endotracheal  Indications: Patient is a 39 year old white female who presents with biliary colic.  The risks and benefits of the procedure including bleeding, infection, hepatobiliary injury, and the possibility of an open procedure, and the possibility of recurrence of the pain were fully explained to the patient, who gave informed consent.  Procedure note: The patient was placed in the supine position.  After induction of general endotracheal anesthesia, the abdomen was prepped and draped using the usual sterile technique with ChloraPrep.  Surgical site confirmation was performed.  A supraumbilical incision was made down to the fascia.  A Veress needle was introduced into the abdominal cavity and confirmation of placement was done using the saline drop test.  The abdomen was then insufflated to 15 mmHg pressure.  An 11 mm trocar was introduced into the abdominal cavity under direct visualization without difficulty.  The patient was placed in reverse Trendelenburg position and an additional 11 mm trocar was placed in the epigastric region and 5 mm trochars were placed the right upper quadrant and right flank regions.  The liver was inspected and noted to be within normal limits.  The gallbladder was retracted in a dynamic fashion in order to provide a critical view of the triangle of Calot.  The cystic duct was first identified.  Its juncture to the infundibulum was fully identified.  Endoclips were placed proximally and distally on the cystic duct, and the cystic duct was divided.  This was likewise done the cystic artery.  The gallbladder was freed away from the gallbladder fossa using Bovie electrocautery.  The gallbladder was delivered through the epigastric trocar site using an Endo Catch  bag.  The gallbladder fossa was inspected and no abnormal bleeding or bile leakage was noted.  Surgicel was placed in the gallbladder fossa.  All fluid and air were then evacuated from the abdominal cavity prior to removal of the trochars.  All wounds were irrigated with normal saline.  All wounds were injected with Exparel.  The supraumbilical fascia was reapproximated using an 0 Vicryl interrupted suture.  All skin incisions were closed using staples.  Betadine ointment and dry sterile dressings were applied.  All tape and needle counts were correct at the end of the procedure.  The patient was extubated in the operating room and transferred to PACU in stable condition.  Complications: None  EBL: Minimal  Specimen: Gallbladder

## 2019-12-11 ENCOUNTER — Encounter (HOSPITAL_COMMUNITY): Payer: Self-pay | Admitting: General Surgery

## 2019-12-11 LAB — SURGICAL PATHOLOGY

## 2019-12-19 ENCOUNTER — Telehealth (INDEPENDENT_AMBULATORY_CARE_PROVIDER_SITE_OTHER): Payer: Medicaid Other | Admitting: General Surgery

## 2019-12-19 DIAGNOSIS — Z09 Encounter for follow-up examination after completed treatment for conditions other than malignant neoplasm: Secondary | ICD-10-CM

## 2019-12-19 MED ORDER — OXYCODONE-ACETAMINOPHEN 10-325 MG PO TABS
1.0000 | ORAL_TABLET | Freq: Four times a day (QID) | ORAL | 0 refills | Status: DC | PRN
Start: 2019-12-19 — End: 2019-12-28

## 2019-12-19 NOTE — Progress Notes (Signed)
Subjective:     Vicki Moore  Virtual postoperative visit performed.  Preop biliary colic resolved.  Pleased with results.  Still having mild incisional pain. Objective:    LMP 11/23/2019 (Approximate)   General:  alert, cooperative and no distress  Final path consistent with diagnosis.     Assessment:    Doing well postoperatively.    Plan:    Percocet reordered.  Follow up prn.  Total time 8 minutes

## 2019-12-26 ENCOUNTER — Telehealth: Payer: Self-pay | Admitting: Family Medicine

## 2019-12-26 NOTE — Telephone Encounter (Signed)
Pt called requesting a refill on her pain medication that she is in excruciating pain. She states that rotating tylenol and motrin does not work.   She received 25 tabs on 12/08/2019 and 25 tab on 12/19/2019.   Per Dr. Lovell Sheehan he will not refill her pain medication that he will be happy to see her on Thursday. She states that she does not have a ride to get here but will try. Apt made.

## 2019-12-28 ENCOUNTER — Other Ambulatory Visit: Payer: Self-pay

## 2019-12-28 ENCOUNTER — Ambulatory Visit (INDEPENDENT_AMBULATORY_CARE_PROVIDER_SITE_OTHER): Payer: Medicaid Other | Admitting: General Surgery

## 2019-12-28 ENCOUNTER — Encounter: Payer: Self-pay | Admitting: General Surgery

## 2019-12-28 VITALS — BP 99/69 | HR 124 | Temp 98.1°F | Resp 18 | Ht 61.5 in | Wt 194.0 lb

## 2019-12-28 DIAGNOSIS — N200 Calculus of kidney: Secondary | ICD-10-CM

## 2019-12-28 DIAGNOSIS — Z09 Encounter for follow-up examination after completed treatment for conditions other than malignant neoplasm: Secondary | ICD-10-CM

## 2019-12-28 MED ORDER — OXYCODONE-ACETAMINOPHEN 10-325 MG PO TABS
1.0000 | ORAL_TABLET | Freq: Four times a day (QID) | ORAL | 0 refills | Status: DC | PRN
Start: 2019-12-28 — End: 2020-03-30

## 2019-12-28 NOTE — Progress Notes (Signed)
Subjective:     [name]  Unexpected visit.  Patient states she is having right back and shoulder pain.  She was recently diagnosed with kidney stones on both sides.  She denies any fever or chills.  She is anxious.  She denies any significant abdominal pain or incisional pain. Objective:    BP 99/69   Pulse (!) 124   Temp 98.1 F (36.7 C) (Oral)   Resp 18   Ht 5' 1.5" (1.562 m)   Wt 194 lb (88 kg)   SpO2 96%   BMI 36.06 kg/m   General:  alert, cooperative and Anxious  Abdomen soft, incisions healing well. Patient does have right-sided CVA tenderness.     Assessment:    Patient appears to be stable from the general surgery standpoint.  She may be starting to pass a stone.  She states she does have some dysuria.  She does not have a primary care physician.    Plan:   I did give her a short course of Percocet.  I have made a referral to urology for further evaluation and treatment.  She was told to drink plenty of fluids.  Should her condition worsen, she was instructed to go the emergency room.  Follow-up here as needed.

## 2020-01-03 ENCOUNTER — Ambulatory Visit: Payer: Medicaid Other | Admitting: Urology

## 2020-01-10 ENCOUNTER — Ambulatory Visit (INDEPENDENT_AMBULATORY_CARE_PROVIDER_SITE_OTHER): Payer: Medicaid Other | Admitting: Urology

## 2020-01-10 ENCOUNTER — Other Ambulatory Visit: Payer: Self-pay

## 2020-01-10 ENCOUNTER — Ambulatory Visit (HOSPITAL_COMMUNITY)
Admission: RE | Admit: 2020-01-10 | Discharge: 2020-01-10 | Disposition: A | Discharge status: Home / Self Care | Payer: Medicaid Other | Source: Ambulatory Visit | Attending: Urology | Admitting: Urology

## 2020-01-10 ENCOUNTER — Encounter: Payer: Self-pay | Admitting: Urology

## 2020-01-10 VITALS — BP 107/69 | HR 97 | Temp 98.2°F | Ht 61.5 in | Wt 197.0 lb

## 2020-01-10 DIAGNOSIS — N200 Calculus of kidney: Secondary | ICD-10-CM

## 2020-01-10 DIAGNOSIS — R1011 Right upper quadrant pain: Secondary | ICD-10-CM

## 2020-01-10 DIAGNOSIS — K449 Diaphragmatic hernia without obstruction or gangrene: Secondary | ICD-10-CM | POA: Diagnosis not present

## 2020-01-10 DIAGNOSIS — M549 Dorsalgia, unspecified: Secondary | ICD-10-CM

## 2020-01-10 DIAGNOSIS — K429 Umbilical hernia without obstruction or gangrene: Secondary | ICD-10-CM | POA: Diagnosis not present

## 2020-01-10 LAB — URINALYSIS, ROUTINE W REFLEX MICROSCOPIC
Bilirubin, UA: NEGATIVE
Glucose, UA: NEGATIVE
Ketones, UA: NEGATIVE
Leukocytes,UA: NEGATIVE
Nitrite, UA: NEGATIVE
Protein,UA: NEGATIVE
Specific Gravity, UA: 1.025 (ref 1.005–1.030)
Urobilinogen, Ur: 0.2 mg/dL (ref 0.2–1.0)
pH, UA: 5.5 (ref 5.0–7.5)

## 2020-01-10 LAB — MICROSCOPIC EXAMINATION: Renal Epithel, UA: NONE SEEN /hpf

## 2020-01-10 MED ORDER — KETOROLAC TROMETHAMINE 30 MG/ML IJ SOLN
60.0000 mg | Freq: Once | INTRAMUSCULAR | Status: AC
  Administered 2020-01-10: 60 mg via INTRAMUSCULAR

## 2020-01-10 MED ORDER — KETOROLAC TROMETHAMINE 60 MG/2ML IM SOLN: 60.0000 mg | Freq: Once | INTRAMUSCULAR | Status: DC

## 2020-01-10 MED ORDER — CYCLOBENZAPRINE HCL 5 MG PO TABS: 5.0000 mg | ORAL_TABLET | Freq: Three times a day (TID) | ORAL | 1 refills | Status: DC | PRN

## 2020-01-10 NOTE — Patient Instructions (Signed)

## 2020-01-10 NOTE — Progress Notes (Signed)
01/10/2020 11:35 AM   Vicki Moore 10-15-1980 [id number]  Referring provider: Franky Macho, MD [id number] ,  [place] [address]  Nephrolithiasis  HPI: Vicki Moore is a 39yo here for evaluation of nephrolithiasis. She was having right flank pain and upper quadrant pain. She underwent  CT on 11/05/2019 which showed small bilateral renal calculi. She underwent cholecystectomy 12/08/2019 with Dr. Lovell Sheehan which improved her nausea and vomiting but did not improve her right flank pain. No other associated symptoms. The pain is constant, sharp, moderate to severe and nonradiating. The pain is worse with standing from a sitting position.   PMH: Past Medical History:  Diagnosis Date  . Anxiety   . Asthma   . Asthma   . Depression   . GERD (gastroesophageal reflux disease)     Surgical History: Past Surgical History:  Procedure Laterality Date  . CHOLECYSTECTOMY N/A 12/08/2019   Procedure: LAPAROSCOPIC CHOLECYSTECTOMY;  Surgeon: Franky Macho, MD;  Location: [facility];  Service: General;  Laterality: N/A;    Home Medications:  Allergies as of 01/10/2020      Reactions   Other Shortness Of Breath, Hives   Peanut-containing Drug Products Hives, Shortness Of Breath   Penicillins Hives, Nausea And Vomiting, Anaphylaxis   Other reaction(s): RASH Other reaction(s): RASH      Medication List       Accurate as of January 10, 2020 11:35 AM. If you have any questions, ask your nurse or doctor.        Advair Diskus 500-50 MCG/DOSE Aepb Generic drug: Fluticasone-Salmeterol Inhale 1 puff into the lungs 2 (two) times daily.   albuterol 108 (90 Base) MCG/ACT inhaler Commonly known as: VENTOLIN HFA Inhale 2 puffs into the lungs every 6 (six) hours as needed. For shortness of breath   alprazolam 2 MG tablet Commonly known as: XANAX Take 2 mg by mouth 3 (three) times daily as needed for anxiety.   alum & mag hydroxide-simeth 200-200-20 MG/5ML suspension Commonly  known as: MAALOX/MYLANTA Take 15 mLs by mouth every 6 (six) hours as needed for indigestion or heartburn.   ferrous sulfate 325 (65 FE) MG tablet Take 1 tablet (325 mg total) by mouth daily with breakfast.   ibuprofen 200 MG tablet Commonly known as: ADVIL Take 400 mg by mouth every 6 (six) hours as needed for headache or mild pain.   ipratropium-albuterol 0.5-2.5 (3) MG/3ML Soln Commonly known as: DUONEB Take 3 mLs by nebulization 4 (four) times daily as needed for wheezing or shortness of breath.   oxyCODONE-acetaminophen 10-325 MG tablet Commonly known as: Percocet Take 1 tablet by mouth every 6 (six) hours as needed for pain.   pantoprazole 40 MG tablet Commonly known as: PROTONIX Take 1 tablet (40 mg total) by mouth 2 (two) times daily before a meal.   senna-docusate 8.6-50 MG tablet Commonly known as: Senokot-S Take 2 tablets by mouth at bedtime as needed for mild constipation or moderate constipation.       Allergies:  Allergies  Allergen Reactions  . Other Shortness Of Breath and Hives  . Peanut-Containing Drug Products Hives and Shortness Of Breath  . Penicillins Hives, Nausea And Vomiting and Anaphylaxis    Other reaction(s): RASH Other reaction(s): RASH     Family History: History reviewed. No pertinent family history.  Social History:  reports that she has been smoking cigarettes. She has a 2.50 pack-year smoking history. She has never used smokeless tobacco. She reports current alcohol use. She reports that she does  not use drugs.  ROS: All other review of systems were reviewed and are negative except what is noted above in HPI  Physical Exam: BP 107/69   Pulse 97   Temp 98.2 F (36.8 C)   Ht 5' 1.5" (1.562 m)   Wt 197 lb (89.4 kg)   BMI 36.62 kg/m   Constitutional:  Alert and oriented, No acute distress. HEENT: [place] AT, moist mucus membranes.  Trachea midline, no masses. Cardiovascular: No clubbing, cyanosis, or edema. Respiratory: Normal  respiratory effort, no increased work of breathing. GI: Abdomen is soft, nontender, nondistended, no abdominal masses GU: No CVA tenderness.  Lymph: No cervical or inguinal lymphadenopathy. Skin: No rashes, bruises or suspicious lesions. Neurologic: Grossly intact, no focal deficits, moving all 4 extremities. Psychiatric: Normal mood and affect.  Laboratory Data: Lab Results  Component Value Date   WBC 6.4 11/15/2019   HGB 10.9 (L) 11/15/2019   HCT 35.9 (L) 11/15/2019   MCV 79.6 (L) 11/15/2019   PLT 241 11/15/2019    Lab Results  Component Value Date   CREATININE 0.80 11/15/2019    No results found for: PSA  No results found for: TESTOSTERONE  Lab Results  Component Value Date   HGBA1C 5.2 11/13/2019    Urinalysis    Component Value Date/Time   COLORURINE YELLOW 11/10/2019 1859   APPEARANCEUR CLEAR 11/10/2019 1859   LABSPEC 1.014 11/10/2019 1859   PHURINE 5.0 11/10/2019 1859   GLUCOSEU NEGATIVE 11/10/2019 1859   HGBUR NEGATIVE 11/10/2019 1859   BILIRUBINUR NEGATIVE 11/10/2019 1859   KETONESUR 80 (A) 11/10/2019 1859   PROTEINUR NEGATIVE 11/10/2019 1859   UROBILINOGEN 0.2 08/15/2010 2057   NITRITE NEGATIVE 11/10/2019 1859   LEUKOCYTESUR NEGATIVE 11/10/2019 1859    Lab Results  Component Value Date   BACTERIA MANY (A) 03/16/2009    Pertinent Imaging: CT abd/pelvis 7/25: Images reviewed and discussed with the patient  No results found for this or any previous visit.  No results found for this or any previous visit.  No results found for this or any previous visit.  No results found for this or any previous visit.  No results found for this or any previous visit.  No results found for this or any previous visit.  No results found for this or any previous visit.  No results found for this or any previous visit.   Assessment & Plan:    1. Nephrolithiasis -CT stone study, will call with results. -If her CT does not show any obstructing calculus we  will proceed with treatment for her dorsalgia.    No follow-ups on file.  Wilkie Aye, MD  [facility]

## 2020-01-10 NOTE — Progress Notes (Signed)
Urological Symptom Review  Patient is experiencing the following symptoms: Frequent urination Burning/pain with urination Leakage of urine Stream starts and stops Have to strain to urinate Painful intercourse   Review of Systems  Gastrointestinal (upper)  : Indigestion/heartburn  Gastrointestinal (lower) : Negative for lower GI symptoms  Constitutional : Fever  Skin: Negative for skin symptoms  Eyes: Blurred vision Double vision  Ear/Nose/Throat : Sinus problems  Hematologic/Lymphatic: Negative for Hematologic/Lymphatic symptoms  Cardiovascular : Leg swelling Chest pain  Respiratory : Shortness of breath  Endocrine: Excessive thirst  Musculoskeletal: Back pain  Neurological: Headaches Dizziness  Psychologic: Anxiety

## 2020-01-12 ENCOUNTER — Encounter (HOSPITAL_COMMUNITY): Payer: Self-pay | Admitting: General Surgery

## 2020-01-13 ENCOUNTER — Other Ambulatory Visit: Payer: Self-pay | Admitting: General Surgery

## 2020-01-13 NOTE — Progress Notes (Deleted)
Referring Provider:*** Primary Care Physician:  Patient, No Pcp Per Primary Gastroenterologist:  Dr. Marletta Lor  No chief complaint on file.   HPI:   Vicki Moore is a 39 y.o. female presenting today at the request of Nelson Chimes, Loura Halt, MD [facility] hospitalist) for nausea and vomiting.   Patient had multiple presentations to the emergency room and admission to the hospital in July 2021 for RUQ abdominal pain and nausea. CT w/o contrast with tiny nonobstructing kidney stones bilaterally, no acute abnormalities.  Upper GI series with small sliding hiatal hernia, otherwise normal, barium tablet swallowed and passed freely into the stomach.  Ultrasound revealed small gallbladder polyps.  No gallstones.  HIDA with normal EF but reproducible symptoms with fatty meal.  Lipase normal. Hospitalist had suspected possible functional dyspepsia versus GERD and she was started on PPI twice daily.  Advised to follow-up outpatient with GI.  She had persistent daily RUQ abdominal pain radiating to right flank, to her shoulder, nausea, and decreased appetite.  She ultimately saw Dr. Lovell Sheehan and in the outpatient setting 12/07/2019 and underwent cholecystectomy 12/08/2019.  Telemedicine follow-up 9/7 with preop biliary colic resolved.  She presented to Dr. Lovell Sheehan office 9/16 with report of right back pain and shoulder pain recently diagnosed with kidney stones on both sides.  Suspected she may be passing a kidney stone.  She was prescribed a short course of Percocet and was referred to urology.  Today:   Abdominal pain/nausea with vomiting:   Anemia: Ferritin low normal, iron and saturation low normal. B12 and folate wnl.   Past Medical History:  Diagnosis Date  . Anxiety   . Asthma   . Asthma   . Depression   . GERD (gastroesophageal reflux disease)     Past Surgical History:  Procedure Laterality Date  . CHOLECYSTECTOMY N/A 12/08/2019   Procedure: LAPAROSCOPIC CHOLECYSTECTOMY;  Surgeon:  Franky Macho, MD;  Location: [facility];  Service: General;  Laterality: N/A;    Current Outpatient Medications  Medication Sig Dispense Refill  . ADVAIR DISKUS 500-50 MCG/DOSE AEPB Inhale 1 puff into the lungs 2 (two) times daily.    Marland Kitchen albuterol (PROVENTIL HFA;VENTOLIN HFA) 108 (90 BASE) MCG/ACT inhaler Inhale 2 puffs into the lungs every 6 (six) hours as needed. For shortness of breath     . alprazolam (XANAX) 2 MG tablet Take 2 mg by mouth 3 (three) times daily as needed for anxiety.    Marland Kitchen alum & mag hydroxide-simeth (MAALOX/MYLANTA) 200-200-20 MG/5ML suspension Take 15 mLs by mouth every 6 (six) hours as needed for indigestion or heartburn.    . cyclobenzaprine (FLEXERIL) 5 MG tablet Take 1 tablet (5 mg total) by mouth 3 (three) times daily as needed for muscle spasms. 30 tablet 1  . ferrous sulfate 325 (65 FE) MG tablet Take 1 tablet (325 mg total) by mouth daily with breakfast. 30 tablet 0  . ibuprofen (ADVIL) 200 MG tablet Take 400 mg by mouth every 6 (six) hours as needed for headache or mild pain.    Marland Kitchen ipratropium-albuterol (DUONEB) 0.5-2.5 (3) MG/3ML SOLN Take 3 mLs by nebulization 4 (four) times daily as needed for wheezing or shortness of breath.    . oxyCODONE-acetaminophen (PERCOCET) 10-325 MG tablet Take 1 tablet by mouth every 6 (six) hours as needed for pain. 20 tablet 0  . pantoprazole (PROTONIX) 40 MG tablet Take 1 tablet (40 mg total) by mouth 2 (two) times daily before a meal. 60 tablet 0  . senna-docusate (SENOKOT-S) 8.6-50  MG tablet Take 2 tablets by mouth at bedtime as needed for mild constipation or moderate constipation. 30 tablet 0   No current facility-administered medications for this visit.    Allergies as of 01/15/2020 - Review Complete 01/10/2020  Allergen Reaction Noted  . Other Shortness Of Breath and Hives 03/18/2011  . Peanut-containing drug products Hives and Shortness Of Breath 03/18/2011  . Penicillins Hives, Nausea And Vomiting, and Anaphylaxis 09/29/2014      No family history on file.  Social History   Socioeconomic History  . Marital status: Single    Spouse name: Not on file  . Number of children: 1  . Years of education: Not on file  . Highest education level: Not on file  Occupational History  . Not on file  Tobacco Use  . Smoking status: Current Some Day Smoker    Packs/day: 0.25    Years: 10.00    Pack years: 2.50    Types: Cigarettes  . Smokeless tobacco: Never Used  Vaping Use  . Vaping Use: Never used  Substance and Sexual Activity  . Alcohol use: Yes    Comment: rare  . Drug use: Never  . Sexual activity: Not on file  Other Topics Concern  . Not on file  Social History Narrative  . Not on file   Social Determinants of Health   Financial Resource Strain:   . Difficulty of Paying Living Expenses: Not on file  Food Insecurity:   . Worried About Programme researcher, broadcasting/film/video in the Last Year: Not on file  . Ran Out of Food in the Last Year: Not on file  Transportation Needs:   . Lack of Transportation (Medical): Not on file  . Lack of Transportation (Non-Medical): Not on file  Physical Activity:   . Days of Exercise per Week: Not on file  . Minutes of Exercise per Session: Not on file  Stress:   . Feeling of Stress : Not on file  Social Connections:   . Frequency of Communication with Friends and Family: Not on file  . Frequency of Social Gatherings with Friends and Family: Not on file  . Attends Religious Services: Not on file  . Active Member of Clubs or Organizations: Not on file  . Attends Banker Meetings: Not on file  . Marital Status: Not on file  Intimate Partner Violence:   . Fear of Current or Ex-Partner: Not on file  . Emotionally Abused: Not on file  . Physically Abused: Not on file  . Sexually Abused: Not on file    Review of Systems: Gen: Denies any fever, chills, fatigue, weight loss, lack of appetite.  CV: Denies chest pain, heart palpitations, peripheral edema, syncope.  Resp:  Denies shortness of breath at rest or with exertion. Denies wheezing or cough.  GI: Denies dysphagia or odynophagia. Denies jaundice, hematemesis, fecal incontinence. GU : Denies urinary burning, urinary frequency, urinary hesitancy MS: Denies joint pain, muscle weakness, cramps, or limitation of movement.  Derm: Denies rash, itching, dry skin Psych: Denies depression, anxiety, memory loss, and confusion Heme: Denies bruising, bleeding, and enlarged lymph nodes.  Physical Exam: There were no vitals taken for this visit. General:   Alert and oriented. Pleasant and cooperative. Well-nourished and well-developed.  Head:  Normocephalic and atraumatic. Eyes:  Without icterus, sclera clear and conjunctiva pink.  Ears:  Normal auditory acuity. Nose:  No deformity, discharge,  or lesions. Mouth:  No deformity or lesions, oral mucosa pink.  Neck:  Supple, without mass or thyromegaly. Lungs:  Clear to auscultation bilaterally. No wheezes, rales, or rhonchi. No distress.  Heart:  S1, S2 present without murmurs appreciated.  Abdomen:  +BS, soft, non-tender and non-distended. No HSM noted. No guarding or rebound. No masses appreciated.  Rectal:  Deferred  Msk:  Symmetrical without gross deformities. Normal posture. Pulses:  Normal pulses noted. Extremities:  Without clubbing or edema. Neurologic:  Alert and  oriented x4;  grossly normal neurologically. Skin:  Intact without significant lesions or rashes. Cervical Nodes:  No significant cervical adenopathy. Psych:  Alert and cooperative. Normal mood and affect.

## 2020-01-15 ENCOUNTER — Encounter: Payer: Self-pay | Admitting: Internal Medicine

## 2020-01-15 ENCOUNTER — Ambulatory Visit: Payer: Medicaid Other | Admitting: Gastroenterology

## 2020-01-16 ENCOUNTER — Telehealth: Payer: Self-pay

## 2020-01-16 NOTE — Telephone Encounter (Signed)
Attempted to call left mess.

## 2020-01-17 ENCOUNTER — Other Ambulatory Visit: Payer: Self-pay | Admitting: General Surgery

## 2020-01-17 NOTE — Telephone Encounter (Signed)
Notified pt. Of Dr. Ronne Binning message. Pt said she did not know where he got that at because she was in a lot of pain d/t those stones.She said he was going to have to give her something for the pain. She asked that He call her. I explainted I would relay the message. Dr. Ronne Binning was made aware.

## 2020-01-23 ENCOUNTER — Encounter (HOSPITAL_COMMUNITY): Payer: Self-pay | Admitting: Emergency Medicine

## 2020-01-23 ENCOUNTER — Emergency Department (HOSPITAL_COMMUNITY)
Admission: EM | Admit: 2020-01-23 | Discharge: 2020-01-24 | Disposition: A | Discharge status: Home / Self Care | Payer: Medicaid Other | Attending: Emergency Medicine | Admitting: Emergency Medicine

## 2020-01-23 ENCOUNTER — Other Ambulatory Visit: Payer: Self-pay

## 2020-01-23 ENCOUNTER — Emergency Department (HOSPITAL_COMMUNITY): Payer: Medicaid Other

## 2020-01-23 DIAGNOSIS — J9600 Acute respiratory failure, unspecified whether with hypoxia or hypercapnia: Secondary | ICD-10-CM | POA: Diagnosis not present

## 2020-01-23 DIAGNOSIS — J4541 Moderate persistent asthma with (acute) exacerbation: Secondary | ICD-10-CM | POA: Insufficient documentation

## 2020-01-23 DIAGNOSIS — R000 Tachycardia, unspecified: Secondary | ICD-10-CM | POA: Insufficient documentation

## 2020-01-23 DIAGNOSIS — R4020 Unspecified coma: Secondary | ICD-10-CM | POA: Diagnosis not present

## 2020-01-23 DIAGNOSIS — T402X1A Poisoning by other opioids, accidental (unintentional), initial encounter: Secondary | ICD-10-CM | POA: Diagnosis not present

## 2020-01-23 DIAGNOSIS — Z20822 Contact with and (suspected) exposure to covid-19: Secondary | ICD-10-CM | POA: Insufficient documentation

## 2020-01-23 DIAGNOSIS — T50904A Poisoning by unspecified drugs, medicaments and biological substances, undetermined, initial encounter: Secondary | ICD-10-CM | POA: Diagnosis not present

## 2020-01-23 DIAGNOSIS — F17210 Nicotine dependence, cigarettes, uncomplicated: Secondary | ICD-10-CM | POA: Diagnosis not present

## 2020-01-23 DIAGNOSIS — R52 Pain, unspecified: Secondary | ICD-10-CM | POA: Diagnosis not present

## 2020-01-23 DIAGNOSIS — Z91010 Allergy to peanuts: Secondary | ICD-10-CM | POA: Insufficient documentation

## 2020-01-23 DIAGNOSIS — R0902 Hypoxemia: Secondary | ICD-10-CM | POA: Insufficient documentation

## 2020-01-23 DIAGNOSIS — R0682 Tachypnea, not elsewhere classified: Secondary | ICD-10-CM | POA: Diagnosis not present

## 2020-01-23 DIAGNOSIS — T40601A Poisoning by unspecified narcotics, accidental (unintentional), initial encounter: Secondary | ICD-10-CM

## 2020-01-23 DIAGNOSIS — R4182 Altered mental status, unspecified: Secondary | ICD-10-CM | POA: Diagnosis not present

## 2020-01-23 DIAGNOSIS — R404 Transient alteration of awareness: Secondary | ICD-10-CM | POA: Diagnosis not present

## 2020-01-23 LAB — I-STAT VENOUS BLOOD GAS, ED
Acid-base deficit: 1 mmol/L (ref 0.0–2.0)
Bicarbonate: 22.6 mmol/L (ref 20.0–28.0)
Calcium, Ion: 1.02 mmol/L — ABNORMAL LOW (ref 1.15–1.40)
HCT: 32 % — ABNORMAL LOW (ref 36.0–46.0)
Hemoglobin: 10.9 g/dL — ABNORMAL LOW (ref 12.0–15.0)
O2 Saturation: 97 %
Potassium: 4.1 mmol/L (ref 3.5–5.1)
Sodium: 147 mmol/L — ABNORMAL HIGH (ref 135–145)
TCO2: 24 mmol/L (ref 22–32)
pCO2, Ven: 31.9 mmHg — ABNORMAL LOW (ref 44.0–60.0)
pH, Ven: 7.458 — ABNORMAL HIGH (ref 7.250–7.430)
pO2, Ven: 83 mmHg — ABNORMAL HIGH (ref 32.0–45.0)

## 2020-01-23 LAB — CBC WITH DIFFERENTIAL/PLATELET
Abs Immature Granulocytes: 0.07 10*3/uL (ref 0.00–0.07)
Basophils Absolute: 0 10*3/uL (ref 0.0–0.1)
Basophils Relative: 0 %
Eosinophils Absolute: 0.4 10*3/uL (ref 0.0–0.5)
Eosinophils Relative: 5 %
HCT: 36.1 % (ref 36.0–46.0)
Hemoglobin: 10.4 g/dL — ABNORMAL LOW (ref 12.0–15.0)
Immature Granulocytes: 1 %
Lymphocytes Relative: 28 %
Lymphs Abs: 2.2 10*3/uL (ref 0.7–4.0)
MCH: 25.2 pg — ABNORMAL LOW (ref 26.0–34.0)
MCHC: 28.8 g/dL — ABNORMAL LOW (ref 30.0–36.0)
MCV: 87.4 fL (ref 80.0–100.0)
Monocytes Absolute: 0.5 10*3/uL (ref 0.1–1.0)
Monocytes Relative: 7 %
Neutro Abs: 4.8 10*3/uL (ref 1.7–7.7)
Neutrophils Relative %: 59 %
Platelets: 221 10*3/uL (ref 150–400)
RBC: 4.13 MIL/uL (ref 3.87–5.11)
RDW: 17.9 % — ABNORMAL HIGH (ref 11.5–15.5)
WBC: 8 10*3/uL (ref 4.0–10.5)
nRBC: 0 % (ref 0.0–0.2)

## 2020-01-23 LAB — COMPREHENSIVE METABOLIC PANEL
ALT: 20 U/L (ref 0–44)
AST: 30 U/L (ref 15–41)
Albumin: 3.4 g/dL — ABNORMAL LOW (ref 3.5–5.0)
Alkaline Phosphatase: 57 U/L (ref 38–126)
Anion gap: 12 (ref 5–15)
BUN: 11 mg/dL (ref 6–20)
CO2: 21 mmol/L — ABNORMAL LOW (ref 22–32)
Calcium: 8.5 mg/dL — ABNORMAL LOW (ref 8.9–10.3)
Chloride: 114 mmol/L — ABNORMAL HIGH (ref 98–111)
Creatinine, Ser: 0.77 mg/dL (ref 0.44–1.00)
GFR, Estimated: >60 mL/min (ref >60)
Glucose, Bld: 95 mg/dL (ref 70–99)
Potassium: 4.1 mmol/L (ref 3.5–5.1)
Sodium: 147 mmol/L — ABNORMAL HIGH (ref 135–145)
Total Bilirubin: 0.4 mg/dL (ref 0.3–1.2)
Total Protein: 5.8 g/dL — ABNORMAL LOW (ref 6.5–8.1)

## 2020-01-23 LAB — CBG MONITORING, ED: Glucose-Capillary: 89 mg/dL (ref 70–99)

## 2020-01-23 LAB — RESPIRATORY PANEL BY RT PCR (FLU A&B, COVID)
Influenza A by PCR: NEGATIVE
Influenza B by PCR: NEGATIVE
SARS Coronavirus 2 by RT PCR: NEGATIVE

## 2020-01-23 LAB — I-STAT BETA HCG BLOOD, ED (MC, WL, AP ONLY): I-stat hCG, quantitative: <5 m[IU]/mL (ref <5)

## 2020-01-23 LAB — SALICYLATE LEVEL: Salicylate Lvl: <7 mg/dL — ABNORMAL LOW (ref 7.0–30.0)

## 2020-01-23 LAB — LACTIC ACID, PLASMA: Lactic Acid, Venous: 2.6 mmol/L (ref 0.5–1.9)

## 2020-01-23 LAB — ETHANOL: Alcohol, Ethyl (B): 142 mg/dL — ABNORMAL HIGH (ref <10)

## 2020-01-23 LAB — ACETAMINOPHEN LEVEL: Acetaminophen (Tylenol), Serum: <10 ug/mL — ABNORMAL LOW (ref 10–30)

## 2020-01-23 MED ORDER — LACTATED RINGERS IV BOLUS
1000.0000 mL | Freq: Once | INTRAVENOUS | Status: AC
  Administered 2020-01-23: 1000 mL via INTRAVENOUS

## 2020-01-23 MED ORDER — IPRATROPIUM-ALBUTEROL 0.5-2.5 (3) MG/3ML IN SOLN
RESPIRATORY_TRACT | Status: AC
  Filled 2020-01-23: qty 3

## 2020-01-23 MED ORDER — DIPHENHYDRAMINE HCL 50 MG/ML IJ SOLN
25.0000 mg | Freq: Once | INTRAMUSCULAR | Status: AC
  Administered 2020-01-24: 25 mg via INTRAVENOUS
  Filled 2020-01-23: qty 1

## 2020-01-23 MED ORDER — METHYLPREDNISOLONE SODIUM SUCC 125 MG IJ SOLR
125.0000 mg | Freq: Once | INTRAMUSCULAR | Status: AC
  Administered 2020-01-23: 125 mg via INTRAVENOUS
  Filled 2020-01-23: qty 2

## 2020-01-23 MED ORDER — MAGNESIUM SULFATE 2 GM/50ML IV SOLN
2.0000 g | Freq: Once | INTRAVENOUS | Status: AC
  Administered 2020-01-23: 2 g via INTRAVENOUS
  Filled 2020-01-23: qty 50

## 2020-01-23 MED ORDER — ACETAMINOPHEN 500 MG PO TABS
1000.0000 mg | ORAL_TABLET | Freq: Once | ORAL | Status: AC
  Administered 2020-01-23: 1000 mg via ORAL
  Filled 2020-01-23: qty 2

## 2020-01-23 MED ORDER — ALBUTEROL SULFATE (2.5 MG/3ML) 0.083% IN NEBU
INHALATION_SOLUTION | RESPIRATORY_TRACT | Status: AC
  Filled 2020-01-23: qty 3

## 2020-01-23 MED ORDER — ALBUTEROL SULFATE (2.5 MG/3ML) 0.083% IN NEBU
5.0000 mg | INHALATION_SOLUTION | Freq: Once | RESPIRATORY_TRACT | Status: AC
  Administered 2020-01-23: 5 mg via RESPIRATORY_TRACT

## 2020-01-23 MED ORDER — ALBUTEROL SULFATE (2.5 MG/3ML) 0.083% IN NEBU
5.0000 mg | INHALATION_SOLUTION | Freq: Once | RESPIRATORY_TRACT | Status: AC
  Administered 2020-01-23: 5 mg via RESPIRATORY_TRACT
  Filled 2020-01-23: qty 6

## 2020-01-23 NOTE — ED Triage Notes (Signed)
Pt brought to ED by [facility] from home for OD on opiates, found unresponsive by family Narcan x 3 given by EMS pta and lidocaine IO. BP 130/80, HR 130. SPO2 98% bagged by EMS, CBG 179.

## 2020-01-23 NOTE — ED Provider Notes (Signed)
[facility] EMERGENCY DEPARTMENT Provider Note   CSN: [id number] Arrival date & time: 01/23/20  2115     History Chief Complaint  Patient presents with  . Drug Overdose    Vicki Moore is a 39 y.o. female.  Pt is a 39y/o female with hx of migraines, asthma, GERD, chronic pain, polysubstance abuse, depression/anxiety presenting today as OVERDOSE.  Family called ems today when they found pt barely breathing and unresponsive.  Pt was agonal and unresponsive the pinpoint pupils when ems arrived.  Pt received 2 mg of Narcan intranasally and 1 dose through an IO.  Patient initially had extreme amount of vomitus in her airway and had a nasal trumpet placed and was being bagged.  EMS reports she never lost pulses and upon arrival here she is starting to respond.  Patient is complaining of diffuse pain everywhere reports she recently got her gallbladder out.  She also reports that she is feeling short of breath and has a terrible history of asthma.  When asked what she took this evening she did admit to drinking alcohol and reported she took 1 Percocet.  She denies wanting to hurt herself.  EMS also reported there were bottles of Xanax as well as Percocet and allergy medicine and various other medicines at the scene.  Family where she lives started CPR prior to EMS arrival.  The history is provided by the patient and the EMS personnel (group home).  Drug Overdose       Past Medical History:  Diagnosis Date  . Anxiety   . Asthma   . Asthma   . Depression   . GERD (gastroesophageal reflux disease)     Patient Active Problem List   Diagnosis Date Noted  . Nephrolithiasis 01/10/2020  . Biliary colic   . Right upper quadrant pain 11/08/2019  . RUQ pain 11/07/2019  . GERD (gastroesophageal reflux disease) 11/07/2019  . Bilateral foot pain 12/08/2017  . 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 Hypoxemia 08/08/2017  . Unspecified asthma with (acute) exacerbation  08/08/2017  . Influenza A with pneumonia 04/29/2017  . 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 Drug overdose 04/23/2017  . Polysubstance abuse (HCC) 04/23/2017  . Anxiety state 03/22/2009  . Asthma 01/02/2009  . LOW BACK PAIN 01/02/2009    Past Surgical History:  Procedure Laterality Date  . CHOLECYSTECTOMY N/A 12/08/2019   Procedure: LAPAROSCOPIC CHOLECYSTECTOMY;  Surgeon: Franky Macho, MD;  Location: [facility];  Service: General;  Laterality: N/A;     OB History   No obstetric history on file.     No family history on file.  Social History   Tobacco Use  . Smoking status: Current Some Day Smoker    Packs/day: 0.25    Years: 10.00    Pack years: 2.50    Types: Cigarettes  . Smokeless tobacco: Never Used  Vaping Use  . Vaping Use: Never used  Substance Use Topics  . Alcohol use: Yes    Comment: rare  . Drug use: Not Currently    Home Medications Prior to Admission medications   Medication Sig Start Date End Date Taking? Authorizing Provider  ADVAIR DISKUS 500-50 MCG/DOSE AEPB Inhale 1 puff into the lungs 2 (two) times daily. 10/11/19   [provider]  albuterol (PROVENTIL HFA;VENTOLIN HFA) 108 (90 BASE) MCG/ACT inhaler Inhale 2 puffs into the lungs every 6 (six) hours as needed. For shortness of breath     [provider]  alprazolam (  XANAX) 2 MG tablet Take 2 mg by mouth 3 (three) times daily as needed for anxiety. 10/16/19   [provider]  alum & mag hydroxide-simeth (MAALOX/MYLANTA) 200-200-20 MG/5ML suspension Take 15 mLs by mouth every 6 (six) hours as needed for indigestion or heartburn.    [provider]  cyclobenzaprine (FLEXERIL) 5 MG tablet Take 1 tablet (5 mg total) by mouth 3 (three) times daily as needed for muscle spasms. 01/10/20   McKenzie, Mardene Celeste, MD  ferrous sulfate 325 (65 FE) MG tablet Take 1 tablet (325 mg total) by mouth daily with breakfast. 11/15/19   Amin, Loura Halt, MD  ibuprofen (ADVIL) 200 MG  tablet Take 400 mg by mouth every 6 (six) hours as needed for headache or mild pain.    [provider]  ipratropium-albuterol (DUONEB) 0.5-2.5 (3) MG/3ML SOLN Take 3 mLs by nebulization 4 (four) times daily as needed for wheezing or shortness of breath. 10/11/19   [provider]  oxyCODONE-acetaminophen (PERCOCET) 10-325 MG tablet Take 1 tablet by mouth every 6 (six) hours as needed for pain. 12/28/19   Franky Macho, MD  pantoprazole (PROTONIX) 40 MG tablet Take 1 tablet (40 mg total) by mouth 2 (two) times daily before a meal. 11/15/19   Amin, Loura Halt, MD  senna-docusate (SENOKOT-S) 8.6-50 MG tablet Take 2 tablets by mouth at bedtime as needed for mild constipation or moderate constipation. 11/15/19   Dimple Nanas, MD    Allergies    Other, Peanut-containing drug products, and Penicillins  Review of Systems   Review of Systems  All other systems reviewed and are negative.   Physical Exam Updated Vital Signs BP (!) 157/142 (BP Location: Right Arm)   Pulse (!) 125   Resp (!) 22   Ht 5' 1.5" (1.562 m)   Wt 89.4 kg   SpO2 91%   BMI 36.64 kg/m   Physical Exam Vitals and nursing note reviewed.  Constitutional:      General: She is not in acute distress.    Appearance: She is well-developed. She is obese. She is ill-appearing.  HENT:     Head: Normocephalic and atraumatic.     Mouth/Throat:     Comments: Nasal trumpet in place and airway with emesis Eyes:     Extraocular Movements: Extraocular movements intact.     Pupils: Pupils are equal, round, and reactive to light.  Cardiovascular:     Rate and Rhythm: Regular rhythm. Tachycardia present.     Heart sounds: Normal heart sounds. No murmur heard.  No friction rub.  Pulmonary:     Effort: Pulmonary effort is normal. Tachypnea present.     Breath sounds: Wheezing present. No rales.  Abdominal:     General: Bowel sounds are normal. There is no distension.     Palpations: Abdomen is soft.      Tenderness: There is no abdominal tenderness. There is no guarding or rebound.  Musculoskeletal:        General: No tenderness. Normal range of motion.     Right lower leg: No edema.     Left lower leg: No edema.     Comments: No edema  Skin:    General: Skin is warm and dry.     Findings: No rash.  Neurological:     Mental Status: She is alert.     Cranial Nerves: No cranial nerve deficit.     Comments: Oriented to person and place.  Appears somewhat confused but able to  move all extremities without difficulty.  Psychiatric:     Comments: Cooperative     ED Results / Procedures / Treatments   Labs (all labs ordered are listed, but only abnormal results are displayed) Labs Reviewed  CBC WITH DIFFERENTIAL/PLATELET - Abnormal; Notable for the following components:      Result Value   Hemoglobin 10.4 (*)    MCH 25.2 (*)    MCHC 28.8 (*)    RDW 17.9 (*)    All other components within normal limits  COMPREHENSIVE METABOLIC PANEL - Abnormal; Notable for the following components:   Sodium 147 (*)    Chloride 114 (*)    CO2 21 (*)    Calcium 8.5 (*)    Total Protein 5.8 (*)    Albumin 3.4 (*)    All other components within normal limits  LACTIC ACID, PLASMA - Abnormal; Notable for the following components:   Lactic Acid, Venous 2.6 (*)    All other components within normal limits  ETHANOL - Abnormal; Notable for the following components:   Alcohol, Ethyl (B) 142 (*)    All other components within normal limits  ACETAMINOPHEN LEVEL - Abnormal; Notable for the following components:   Acetaminophen (Tylenol), Serum <10 (*)    All other components within normal limits  SALICYLATE LEVEL - Abnormal; Notable for the following components:   Salicylate Lvl <7.0 (*)    All other components within normal limits  I-STAT VENOUS BLOOD GAS, ED - Abnormal; Notable for the following components:   pH, Ven 7.458 (*)    pCO2, Ven 31.9 (*)    pO2, Ven 83.0 (*)    Sodium 147 (*)    Calcium,  Ion 1.02 (*)    HCT 32.0 (*)    Hemoglobin 10.9 (*)    All other components within normal limits  RESPIRATORY PANEL BY RT PCR (FLU A&B, COVID)  URINALYSIS, ROUTINE W REFLEX MICROSCOPIC  I-STAT BETA HCG BLOOD, ED ([facility], [facility], [facility] ONLY)  CBG MONITORING, ED    EKG EKG Interpretation  Date/Time:  Tuesday January 23 2020 21:28:01 EDT Ventricular Rate:  118 PR Interval:    QRS Duration: 108 QT Interval:  363 QTC Calculation: 509 R Axis:   33 Text Interpretation: Sinus tachycardia Abnormal R-wave progression, early transition Borderline repolarization abnormality new Borderline prolonged QT interval Confirmed by Gwyneth SproutPlunkett, Romy Mcgue ([id number]) on 01/23/2020 9:31:40 PM   Radiology DG Chest Port 1 View  Result Date: 01/23/2020 CLINICAL DATA:  Drug overdose, altered mental status EXAM: PORTABLE CHEST 1 VIEW COMPARISON:  11/07/2019 FINDINGS: The heart size and mediastinal contours are within normal limits. Both lungs are clear. The visualized skeletal structures are unremarkable. IMPRESSION: No active disease. Electronically Signed   By: Helyn NumbersAshesh  Parikh MD   On: 01/23/2020 21:47    Procedures Procedures (including critical care time)  Medications Ordered in ED Medications  lactated ringers bolus 1,000 mL (has no administration in time range)  methylPREDNISolone sodium succinate (SOLU-MEDROL) 125 mg/2 mL injection 125 mg (has no administration in time range)  magnesium sulfate IVPB 2 g 50 mL (has no administration in time range)  albuterol (PROVENTIL) (2.5 MG/3ML) 0.083% nebulizer solution 5 mg (has no administration in time range)  ipratropium-albuterol (DUONEB) 0.5-2.5 (3) MG/3ML nebulizer solution (has no administration in time range)  albuterol (PROVENTIL) (2.5 MG/3ML) 0.083% nebulizer solution (has no administration in time range)    ED Course  I have reviewed the triage vital signs and the nursing notes.  Pertinent labs &  imaging results that were available during my care of the patient  were reviewed by me and considered in my medical decision making (see chart for details).    MDM Rules/Calculators/A&P                          Patient arrived by EMS after most likely accidental overdose with Percocet.  Patient was found down and not responding by bystanders and had bystander CPR.  When EMS arrived patient was agonal and hypoxic but did have a pulse.  She received 2 mg of intranasal Narcan and 1 in the IO.  Patient had multiple episodes of emesis and unable to obtain an airway.  Nasal trumpet was placed and patient was being bagged upon arrival.  Upon arrival here patient open her eyes and started speaking.  She is complaining of shortness of breath and does have a history of asthma and diffuse wheezing upon arrival here.  Sats dropped as low as 87% she was placed on 5 L nasal cannula with O2 sat improvement to 93 to 94%.  Patient started on albuterol neb as well as Solu-Medrol and magnesium.  Chest x-ray clear but do have some concern for aspiration.  CBG within normal limits.  EKG was sinus tachycardia and new prolonged QT.  Patient has no evidence of injury but is complaining of pain everywhere most likely due to reversing the narcotics she took.  Patient given an IV bolus for her tachycardia.  Wheezing and breathing started to improve after albuterol neb.  Labs are pending.  10:53 PM Pt still wheezing but improved and will give second treatment.  Pt reports she has pain everywhere now and can't stand it.  Will give tylenol but discussed with pt she cannot have narcotics.  11:01 PM Patient reports that she wants to leave AMA because she is sitting here suffering and she needs something for pain and anxiety.  Discussed with patient that she came close to death because of overdosing accidentally on narcotics and she cannot receive any narcotic medication.  As well she will not receive benzodiazepines due to how severely depressed her mental status was when she arrived.  Patient is  upset by this but reports she does not have a way to get home.  Encourage patient to stay and continue treatment as labs are still pending and patient is still requiring oxygen.  11:17 PM VBG without evidence of hypercarbia or acidosis, CBC without acute change, CMP with mild hyponatremia of 147 but normal renal function.  EtOH of 142 but negative acetaminophen and salicylates and lactic acid 2.6.  Patient was already given IV fluids.  Covid is negative. MDM Number of Diagnoses or Management Options   Amount and/or Complexity of Data Reviewed Clinical lab tests: ordered and reviewed Tests in the radiology section of CPT: ordered and reviewed Tests in the medicine section of CPT: ordered and reviewed Decide to obtain previous medical records or to obtain history from someone other than the patient: yes Obtain history from someone other than the patient: yes Review and summarize past medical records: yes Independent visualization of images, tracings, or specimens: yes  Risk of Complications, Morbidity, and/or Mortality Presenting problems: moderate Diagnostic procedures: low Management options: low  Patient Progress Patient progress: improved    Final Clinical Impression(s) / ED Diagnoses Final diagnoses:  Opiate overdose, accidental or unintentional, initial encounter (HCC)  Moderate persistent asthma with exacerbation  Hypoxia    Rx / DC Orders  ED Discharge Orders    None       Gwyneth Sprout, MD 01/23/20 2318

## 2020-01-23 NOTE — ED Notes (Signed)
Vicki Moore [phone] he has her 38 yr old son

## 2020-01-23 NOTE — ED Provider Notes (Signed)
11:37 PM Assumed care from Dr. Anitra Lauth, please see their note for full history, physical and decision making until this point. In brief this is a 39 y.o. year old female who presented to the ED tonight with Drug Overdose     Found initially in coded as a CPR however ultimately it was apneic secondary to narcotic.  Patient received Narcan and has responded appropriately however she did have some vomiting with a possible aspiration event but also has asthma so at this time we will try to wean her off oxygen she really wants to go home.  Her labs were relatively unremarkable.  Patient requesting to be discharged. On my evaluation she had just finished walking and was profoundly tachypneic (barely able to speak let alone in full sentences) with oxygen in high 60's. Lungs still diminished and sounding wheezy. Unclear if this is from asthma or more likely aspiration event causing resp failure. At rest she is 90-94%. I discussed with her that she would be absolutely be leaving AGAINST MEDICAL ADVICE if she left.  I described to her the very high chance of her dying as this is likely pneumonitis rather than an asthma exacerbation.  She understood this and is at this time competent to make a decision and prefers to not make it at this point.  She persistently asked me for more pain medication however secondary to the overdose and pain not being a life-threatening condition I refused.  On review of her records it does appear that she is on chronic Xanax so we will order a dose of that.  It does appear that she is on oxycodone but it looks like it has been temporary prescriptions for acute events most recently from Dr. Lovell Sheehan for her surgery.  I will decline any pain medicine in the emergency room and I will not give any prescriptions for discharge.  Will reevaluate in 30 to 45 minutes to see if she is willing to be admitted at that time once her xanax kicks in.  After multiple hours of observation patient  persistently with the very request to go home.  Patient is awake alert and competent to make that decision.  Patient then subsequently also asked for prescriptions for her Xanax and Percocet which I denied.  I tried multiple times to get her stay in the hospital secondary to her profound hypoxia however she refused.  She stated that she wants today because she and the people who are very nice to her because he would not give her pain medicine.  Patient ultimately was discharged AGAINST MEDICAL ADVICE knowing the severity of her illness and the fact that she has a very high chance of dying when she leaves.   Labs, studies and imaging reviewed by myself and considered in medical decision making if ordered. Imaging interpreted by radiology.  Labs Reviewed  CBC WITH DIFFERENTIAL/PLATELET - Abnormal; Notable for the following components:      Result Value   Hemoglobin 10.4 (*)    MCH 25.2 (*)    MCHC 28.8 (*)    RDW 17.9 (*)    All other components within normal limits  COMPREHENSIVE METABOLIC PANEL - Abnormal; Notable for the following components:   Sodium 147 (*)    Chloride 114 (*)    CO2 21 (*)    Calcium 8.5 (*)    Total Protein 5.8 (*)    Albumin 3.4 (*)    All other components within normal limits  LACTIC ACID, PLASMA - Abnormal; Notable  for the following components:   Lactic Acid, Venous 2.6 (*)    All other components within normal limits  ETHANOL - Abnormal; Notable for the following components:   Alcohol, Ethyl (B) 142 (*)    All other components within normal limits  ACETAMINOPHEN LEVEL - Abnormal; Notable for the following components:   Acetaminophen (Tylenol), Serum <10 (*)    All other components within normal limits  SALICYLATE LEVEL - Abnormal; Notable for the following components:   Salicylate Lvl <7.0 (*)    All other components within normal limits  I-STAT VENOUS BLOOD GAS, ED - Abnormal; Notable for the following components:   pH, Ven 7.458 (*)    pCO2, Ven 31.9 (*)     pO2, Ven 83.0 (*)    Sodium 147 (*)    Calcium, Ion 1.02 (*)    HCT 32.0 (*)    Hemoglobin 10.9 (*)    All other components within normal limits  RESPIRATORY PANEL BY RT PCR (FLU A&B, COVID)  URINALYSIS, ROUTINE W REFLEX MICROSCOPIC  I-STAT BETA HCG BLOOD, ED ([facility], [facility], [facility] ONLY)  CBG MONITORING, ED    DG Chest Port 1 View  Final Result      No follow-ups on file.    Nichelle Renwick, Barbara Cower, MD 01/24/20 [id number]

## 2020-01-24 ENCOUNTER — Ambulatory Visit: Payer: Medicaid Other | Admitting: Family Medicine

## 2020-01-24 LAB — URINALYSIS, ROUTINE W REFLEX MICROSCOPIC
Bilirubin Urine: NEGATIVE
Glucose, UA: NEGATIVE mg/dL
Hgb urine dipstick: NEGATIVE
Ketones, ur: NEGATIVE mg/dL
Leukocytes,Ua: NEGATIVE
Nitrite: NEGATIVE
Protein, ur: NEGATIVE mg/dL
Specific Gravity, Urine: 1.01 (ref 1.005–1.030)
pH: 7 (ref 5.0–8.0)

## 2020-01-24 MED ORDER — ALPRAZOLAM 0.25 MG PO TABS
2.0000 mg | ORAL_TABLET | Freq: Once | ORAL | Status: DC
  Administered 2020-01-24: 2 mg via ORAL
  Filled 2020-01-24: qty 8

## 2020-01-24 MED ORDER — PREDNISONE 20 MG PO TABS
60.0000 mg | ORAL_TABLET | Freq: Once | ORAL | Status: AC
  Administered 2020-01-24: 60 mg via ORAL
  Filled 2020-01-24: qty 3

## 2020-01-24 MED ORDER — ALPRAZOLAM 0.25 MG PO TABS: 1.0000 mg | ORAL_TABLET | Freq: Once | ORAL | Status: DC

## 2020-01-24 NOTE — ED Notes (Signed)
Pt able to tolerate PO fluids without difficulty, denies nausea. Pt requesting Xanax reportedly taking 2mg  dose TID, report new feelings of anxiousness. Mesner, MD made aware.

## 2020-01-24 NOTE — ED Notes (Signed)
Pt ambulated walked steady on feet. Pt oxygen dropped to 83 was out of breath while walking. Pt oxygen went back to 94 on room air.

## 2020-03-30 ENCOUNTER — Encounter (HOSPITAL_COMMUNITY): Payer: Self-pay | Admitting: Emergency Medicine

## 2020-03-30 ENCOUNTER — Other Ambulatory Visit: Payer: Self-pay

## 2020-03-30 ENCOUNTER — Ambulatory Visit (HOSPITAL_COMMUNITY)
Admission: EM | Admit: 2020-03-30 | Discharge: 2020-03-30 | Disposition: A | Discharge status: Home / Self Care | Payer: Medicaid Other | Attending: Psychiatry | Admitting: Psychiatry

## 2020-03-30 DIAGNOSIS — M25511 Pain in right shoulder: Secondary | ICD-10-CM | POA: Diagnosis not present

## 2020-03-30 DIAGNOSIS — F411 Generalized anxiety disorder: Secondary | ICD-10-CM

## 2020-03-30 DIAGNOSIS — F419 Anxiety disorder, unspecified: Secondary | ICD-10-CM | POA: Insufficient documentation

## 2020-03-30 NOTE — Discharge Instructions (Signed)

## 2020-03-30 NOTE — ED Notes (Signed)
LOCKER #[phone] 

## 2020-03-30 NOTE — ED Triage Notes (Signed)
Pt presents to [facility] with worsening anxiety. Pt states that she lost her grandmother about a month ago, and had to watch the funeral from an iPad. "It's been a month since she died, and it's been getting really hard for me." Pt denies SI/HI, and AVH.

## 2020-03-30 NOTE — BH Assessment (Signed)
Comprehensive Clinical Assessment (CCA) Note  03/30/2020 Rainey PinesLeanne M Keimig [id number]   [facility] seeking help and medication for her anxiety.  Patient states that she has a long history of anxiety and states that she used to be treated with Alprazolam 2 mg po tid.  Patient states that she lost her MD who was prescribing it to her and states thats he has not been able to get it since.  That was five to six years ago.  Patient states that her grandmother recently died and she states that she has been struggling with anxiety since then.  Patient states that she is having panic attacks and states that she has not been able to sleep.  Patient denies SI/HI/Psychosis.  However, patient states that she had an accidential overdose last October on Percocet.  Patient states that she has a history of opioid addiction, but states that she is currently on a suboxone maintenance program and states that she is stable and no longer uses percocet.  Patient states that her appetitie is on and off, but states that she does not think that she has lost any weight.  Patient denies any history of abuse or self-mutilation.  Patient is alert and oriented, her mood is depressed, but mostly anxious.  Her judgment, insight and impulse control are mildly impaired.  Her thoughts are organized and her memory is intact.  She does not appear to be responding to any internal stimuli.   Chief Complaint:  Chief Complaint  Patient presents with  . Anxiety   Visit Diagnosis: F41.1 Anxiety Disorder   CCA Screening, Triage and Referral (STR)  Patient Reported Information How did you hear about us? Self (Phreesia 03/30/2020)  Referral name: Daun PeacockLeanne Ishman [facility](Phreesia 03/30/2020)  Referral phone number: No data recorded  Whom do you see for routine medical problems? I don't have a doctor (Phreesia 03/30/2020)  Practice/Facility Name: No data recorded Practice/Facility Phone Number: No data  recorded Name of Contact: No data recorded Contact Number: No data recorded Contact Fax Number: No data recorded Prescriber Name: No data recorded Prescriber Address (if known): No data recorded  What Is the Reason for Your Visit/Call Today? Anxiety And Rash (Phreesia 03/30/2020)  How Long Has This Been Causing You Problems? 1-6 months (Phreesia 03/30/2020)  What Do You Feel Would Help You the Most Today? Medication (Phreesia 03/30/2020)   Have You Recently Been in Any Inpatient Treatment (Hospital/Detox/Crisis Center/28-Day Program)? No (Phreesia 03/30/2020)  Name/Location of Program/Hospital:No data recorded How Long Were You There? No data recorded When Were You Discharged? No data recorded  [facility] You [facility]? Yes (Phreesia 03/30/2020)  [facility] Noone (Phreesia 03/30/2020)   Have You Recently Had Any Thoughts About Hurting Yourself? No (Phreesia 03/30/2020)  Are You Planning to Commit Suicide/Harm Yourself At This time? No (Phreesia 03/30/2020)   Have you Recently Had Thoughts About Hurting Someone Karolee Ohslse? No (Phreesia 03/30/2020)  Explanation: No data recorded  Have You Used Any Alcohol or Drugs in the Past 24 Hours? No (Phreesia 03/30/2020)  How Long Ago Did You Use Drugs or Alcohol? No data recorded What Did You Use and How Much? No data recorded  Do You Currently Have a Therapist/Psychiatrist? No (Phreesia 03/30/2020)  Name of Therapist/Psychiatrist: No data recorded  Have You Been Recently Discharged From Any Office Practice or Programs? No (Phreesia 03/30/2020)  Explanation of Discharge From Practice/Program: No data recorded  CCA Screening Triage Referral Assessment Type of Contact: No data recorded Is this Initial or Reassessment? No data recorded Date Telepsych consult ordered in [facility]:  No data recorded Time Telepsych consult ordered in [facility]:  No data recorded  Patient Reported Information  Reviewed? No data recorded Patient Left Without Being Seen? No data recorded Reason for Not Completing Assessment: No data recorded  Collateral Involvement: No data recorded  Does Patient Have a Court Appointed Legal Guardian? No data recorded Name and Contact of Legal Guardian: No data recorded If Minor and Not Living with Parent(s), Who has Custody? No data recorded Is CPS involved or ever been involved? No data recorded Is APS involved or ever been involved? No data recorded  Patient Determined To Be At Risk for Harm To Self or Others Based on Review of Patient Reported Information or Presenting Complaint? No data recorded Method: No data recorded Availability of Means: No data recorded Intent: No data recorded Notification Required: No data recorded Additional Information for Danger to Others Potential: No data recorded Additional Comments for Danger to Others Potential: No data recorded Are There Guns or Other Weapons in Your Home? No data recorded Types of Guns/Weapons: No data recorded Are These Weapons Safely Secured?                            No data recorded Who Could Verify You Are Able To Have These Secured: No data recorded Do You Have any Outstanding Charges, Pending Court Dates, Parole/Probation? No data recorded Contacted To Inform of Risk of Harm To Self or Others: No data recorded  Location of Assessment: No data recorded  Does Patient Present under Involuntary Commitment? No data recorded IVC Papers Initial File Date: No data recorded  [place] of Residence: No data recorded  Patient Currently Receiving the Following Services: No data recorded  Determination of Need: No data recorded  Options For Referral: No data recorded    CCA Biopsychosocial Intake/Chief Complaint:  Patient [facility] at the [facility] as a walk-in seeking help and medication for her anxiety.  Patient states that she has a long history of anxiety and states that she used to be treated with  Alprazolam 2 mg po tid.  Patient states that she lost her MD who was prescribing it to her and states thats he has not been able to get it since.  That was five to six years ago.  Patient states that her grandmother recently died and she states that she has been struggling with anxiety since then.  Patient states that she is having panic attacks and states that she has not been able to sleep.  Patient denies SI/HI/Psychosis.  However, patient states that she had an accidential overdose last October on Percocet.  Patient states that she has a history of opioid addiction, but states that she is currently on a suboxone maintenance program and states that she is stable and no longer uses percocet.  Patient states that her appetitie is on and off, but states that she does not think that she has lost any weight.  Patient denies any history of abuse or self-mutilation.  Current Symptoms/Problems: anxiety attacks and insomnia   Patient Reported Schizophrenia/Schizoaffective Diagnosis in Past: No data recorded  Strengths: Patient states that she is artistic  Preferences: Patient has no special needs that require accommodation  Abilities: Patient states that she is good with art   Type of Services Patient Feels are Needed: Patient would  like medication for her anxiety   Initial Clinical Notes/Concerns: No data recorded  Mental Health Symptoms Depression:  Sleep (too much or little); Change in energy/activity   Duration of Depressive symptoms: Greater than two weeks   Mania:  None   Anxiety:   None   Psychosis:  None   Duration of Psychotic symptoms: No data recorded  Trauma:  None   Obsessions:  None   Compulsions:  None   Inattention:  None   Hyperactivity/Impulsivity:  N/A   Oppositional/Defiant Behaviors:  N/A   Emotional Irregularity:  None   Other Mood/Personality Symptoms:  No data recorded   Mental Status Exam Appearance and self-care  Stature:  Average   Weight:   Overweight   Clothing:  Casual   Grooming:  Normal   Cosmetic use:  Age appropriate   Posture/gait:  Normal   Motor activity:  Restless   Sensorium  Attention:  Normal   Concentration:  Anxiety interferes   Orientation:  Object; Person; Place; Situation; Time   Recall/memory:  Normal   Affect and Mood  Affect:  Anxious; Depressed   Mood:  Anxious; Depressed   Relating  Eye contact:  None   Facial expression:  Anxious   Attitude toward examiner:  Cooperative   Thought and Language  Speech flow: Clear and Coherent   Thought content:  Appropriate to Mood and Circumstances   Preoccupation:  None   Hallucinations:  None   Organization:  No data recorded  [employer] of Knowledge:  Good   Intelligence:  Average   Abstraction:  Normal   Judgement:  Fair   Dance movement psychotherapist:  Realistic   Insight:  Lacking   Decision Making:  Normal   Social Functioning  Social Maturity:  Impulsive   Social Judgement:  Normal   Stress  Stressors:  Grief/losses   Coping Ability:  Normal   Skill Deficits:  Decision making   Supports:  Support needed     Religion: Religion/Spirituality Are You A Religious Person?:  (not assessed)  Leisure/Recreation: Leisure / Recreation Do You Have Hobbies?: Yes Leisure and Hobbies: Art and reading  Exercise/Diet: Exercise/Diet Do You Exercise?: No Have You Gained or Lost A Significant Amount of Weight in the Past Six Months?: No Do You Follow a Special Diet?: No Do You Have Any Trouble Sleeping?: No   CCA Employment/Education Employment/Work Situation: Employment / Work Situation Employment situation: On disability Why is patient on disability: not assessed How long has patient been on disability: not assessed Patient's job has been impacted by current illness: No What is the longest time patient has a held a job?: 15 years Where was the patient employed at that time?: not assessed Has patient ever  been in the [employer]?: No  Education: Education Is Patient Currently Attending School?: No Last Grade Completed: 12 Name of High School: [employer] Did Garment/textile technologist From [employer]?: Yes Did Theme park manager?: Yes What Type of 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?: Network engineer Did Designer, television/film set?: No Did You Have An Individualized Education Program (IIEP): No Did You Have Any Difficulty At School?: No Patient's Education Has Been Impacted by Current Illness: No   CCA Family/Childhood History Family and Relationship History: Family history Marital status: Single Are you sexually active?: Yes What is your sexual orientation?: heterosexual Has your sexual activity been affected by drugs, alcohol, medication, or emotional stress?: not assessed Does patient have children?: Yes How many children?: 1 How is  patient's relationship with their children?: patient has a 73 year old son she is close to  Childhood History:  Childhood History By whom was/is the patient raised?: Mother Additional childhood history information: father was an alcoholic who was never in her life much Description of patient's relationship with caregiver when they were a child: patient states that she was close to her mother growing up Patient's description of current relationship with people who raised him/her: patient states that she is still close to her mother How were you disciplined when you got in trouble as a child/adolescent?: patient states that she was disciplined appropriately and not abused Does patient have siblings?: No Did patient suffer any verbal/emotional/physical/sexual abuse as a child?: No Did patient suffer from severe childhood neglect?: No Has patient ever been sexually abused/assaulted/raped as an adolescent or adult?: No Was the patient ever a victim of a crime or a disaster?: No Witnessed domestic violence?: No Has patient been affected by domestic violence as an adult?:  No  Child/Adolescent Assessment:     CCA Substance Use Alcohol/Drug Use: Alcohol / Drug Use Pain Medications: see MAR Prescriptions: see MAR Over the Counter: see MAR History of alcohol / drug use?: Yes (patient states that she has a history of opioid abuse, but states that she is on a suboxone maintenance program and has not abused any drugs recently.) Longest period of sobriety (when/how long): not assessed Negative Consequences of Use: Financial,Personal relationships                         ASAM's:  Six Dimensions of Multidimensional Assessment  Dimension 1:  Acute Intoxication and/or Withdrawal Potential:   Dimension 1:  Description of individual's past and current experiences of substance use and withdrawal: Patient states that she has no current withdrawal complications/symptoms  Dimension 2:  Biomedical Conditions and Complications:   Dimension 2:  Description of patient's biomedical conditions and  complications: Patient states that she has used opioids to self-medicate her physical pain and has experienced an accidental overdose  Dimension 3:  Emotional, Behavioral, or Cognitive Conditions and Complications:  Dimension 3:  Description of emotional, behavioral, or cognitive conditions and complications: Patient has a history of benzodiazepine abuse many years ago when 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  Dimension 4:  Readiness to Change:  Dimension 4:  Description of Readiness to Change criteria: Patient is in the contemplation stage of change understanding her life is currently not where she would like herself to be  Dimension 5:  Relapse, Continued use, or Continued Problem Potential:  Dimension 5:  Relapse, continued use, or continued problem potential critiera description: Patient has poor coping mechanisms and is at high risk for relapse  Dimension 6:  Recovery/Living Environment:  Dimension 6:  Recovery/Iiving environment criteria description: Patient states that she lives in an  environment that is conducive to her recovery  ASAM Severity Score: ASAM's Severity Rating Score: 12  ASAM Recommended Level of Treatment:     Substance use Disorder (SUD) Substance Use Disorder (SUD)  Checklist Symptoms of Substance Use: Continued use despite having a persistent/recurrent physical/psychological problem caused/exacerbated by use,Continued use despite persistent or recurrent social, interpersonal problems, caused or exacerbated by use,Evidence of tolerance,Recurrent use that results in a failure to fulfill major role obligations (work, school, home),Substance(s) often taken in larger amounts or over longer times than was intended  Recommendations for Services/Supports/Treatments: Recommendations for Services/Supports/Treatments Recommendations For Services/Supports/Treatments: CD-IOP Intensive Chemical Dependency Program  DSM5 Diagnoses: Patient Active Problem  List   Diagnosis Date Noted  . Anxiety disorder   . Nephrolithiasis 01/10/2020  . Biliary colic   . Right upper quadrant pain 11/08/2019  . RUQ pain 11/07/2019  . GERD (gastroesophageal reflux disease) 11/07/2019  . Bilateral foot pain 12/08/2017  . 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 Hypoxemia 08/08/2017  . Unspecified asthma with (acute) exacerbation 08/08/2017  . Influenza A with pneumonia 04/29/2017  . 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 Drug overdose 04/23/2017  . Polysubstance abuse (HCC) 04/23/2017  . Anxiety state 03/22/2009  . Asthma 01/02/2009  . LOW BACK PAIN 01/02/2009    Disposition: Per Berneice Heinrich, NP, patient does not meet inpatient admission criteria and can follow-up with the [facility].    Referrals to Alternative Service(s): Referred to Alternative Service(s):   Place:   Date:   Time:    Referred to Alternative Service(s):   Place:   Date:   Time:    Referred to Alternative Service(s):   Place:   Date:   Time:    Referred to Alternative Service(s):   Place:    Date:   Time:     Ivery Michalski J Trent Theisen, LCAS

## 2020-03-30 NOTE — ED Provider Notes (Signed)
Behavioral Health Urgent Care Medical Screening Exam  Patient Name: Vicki Moore MRN: [id number] Date of Evaluation: 03/30/20 Chief Complaint:   Diagnosis:  Final diagnoses:  Anxiety state    History of Present illness: Vicki Moore is a 39 y.o. female.  Patient states "I am from [place] and my grandmother passed away 1 month ago in [place] and I am not dealing with it too well."  Patient reports increase in anxiety x1 month.  Patient reports her anxiety is "through the roof."  Patient reports she has a history of anxiety and was treated in the past using alprazolam.  Patient reports she has not been prescribed alprazolam for approximately 6 years.  Patient asked several times during assessment if she could be prescribed alprazolam.   Patient reports recent stressors include be death of her grandmother.  Patient reports sadness regarding the fact that she was unable to attend service and had to watch it virtually.  Patient reports she is currently in pain to her right shoulder 10 out of 10.  Patient reports she feels this pain every day it is chronic in nature.  Patient also reports itching under bilateral breast.  Patient resides in [place] with her 1 year old son.  Patient reports her fianc resides in her home most times but sometimes returns to his home.  Patient denies access to weapons.  Patient reports she is currently not employed.  Patient denies both alcohol and substance use currently.  Patient states "I suffered from opioid addiction for many years but I have been taking Subutex."  Patient reports she is currently followed by [facility] Rubye Oaks [facility] where she is prescribed Subutex.  Patient denies current outpatient therapy, states she has seen outpatient counselors in the past.  Patient reports plan to follow-up with outpatient counseling as well as psychiatry.  Patient assessed by nurse practitioner.  Patient alert and oriented, answers appropriately.   Patient denies suicidal ideations.  Patient denies any history of suicide attempts, denies any self-harm behaviors.  Patient denies homicidal ideations.  Patient denies both auditory and visual hallucinations.  There is no evidence of delusional thought content and no indication that patient is responding to internal stimuli.  Patient denies symptoms of paranoia.  Patient offered support and encouragement.  Psychiatric Specialty Exam  Presentation  General Appearance:Appropriate for Environment; Casual  Eye Contact:Good  Speech:Clear and Coherent; Normal Rate  Speech Volume:Normal  Handedness:Right   Mood and Affect  Mood:Anxious  Affect:Appropriate; Congruent   Thought Process  Thought Processes:Coherent; Goal Directed  Descriptions of Associations:Intact  Orientation:Full (Time, Place and Person)  Thought Content:Logical; WDL  Hallucinations:None  Ideas of Reference:None  Suicidal Thoughts:No  Homicidal Thoughts:No   Sensorium  Memory:Immediate Good; Recent Good; Remote Good  Judgment:Good  Insight:Good   Executive Functions  Concentration:Good  Attention Span:Good  Recall:Good  Fund of Knowledge:Good  Language:Good   Psychomotor Activity  Psychomotor Activity:Normal   Assets  Assets:Communication Skills; Desire for Improvement; Financial Resources/Insurance; Housing; Intimacy; Leisure Time; Physical Health; Resilience; Social Support; Talents/Skills   Sleep  Sleep:Poor  Number of hours: No data recorded  Physical Exam: Physical Exam Vitals and nursing note reviewed.  Constitutional:      Appearance: She is well-developed.  HENT:     Head: Normocephalic.  Cardiovascular:     Rate and Rhythm: Normal rate.  Pulmonary:     Effort: Pulmonary effort is normal.  Neurological:     Mental Status: She is alert and oriented to person, place, and time.  Psychiatric:        Attention and Perception: Attention and perception normal.         Mood and Affect: Affect normal. Mood is anxious.        Speech: Speech normal.        Behavior: Behavior normal. Behavior is cooperative.        Thought Content: Thought content normal.        Cognition and Memory: Cognition and memory normal.        Judgment: Judgment normal.    Review of Systems  Constitutional: Negative.   HENT: Negative.   Eyes: Negative.   Respiratory: Negative.   Cardiovascular: Negative.   Gastrointestinal: Negative.   Genitourinary: Negative.   Musculoskeletal: Negative.   Skin: Negative.   Neurological: Negative.   Endo/Heme/Allergies: Negative.   Psychiatric/Behavioral: The patient is nervous/anxious.    Pulse (!) 125, temperature 98.4 F (36.9 C), temperature source Oral, resp. rate 16, SpO2 99 %. There is no height or weight on file to calculate BMI.  Musculoskeletal: Strength & Muscle Tone: within normal limits Gait & Station: normal Patient leans: N/A   [facility] MSE Discharge Disposition for Follow up and Recommendations: Based on my evaluation the patient does not appear to have an emergency medical condition and can be discharged with resources and follow up care in outpatient services for Medication Management and Individual Therapy  Patient reviewed with Dr. Lucianne Muss. Follow-up with outpatient psychiatry, referrals provided. Follow-up with community health and wellness, referral provided.   Vicki Dolly, FNP 03/30/2020, 3:42 PM

## 2020-04-04 ENCOUNTER — Telehealth (HOSPITAL_COMMUNITY): Payer: Self-pay | Admitting: General Practice

## 2020-04-04 NOTE — Telephone Encounter (Signed)
Care Management - Follow Up [facility] Discharges   Writer attempted to make contact with patient today and was unsuccessful.  Writer was able to leave a HIPPA compliant voice message and will await callback.   

## 2020-04-16 ENCOUNTER — Other Ambulatory Visit: Payer: Self-pay

## 2020-04-16 ENCOUNTER — Ambulatory Visit: Payer: Medicaid Other | Attending: Family Medicine | Admitting: Family Medicine

## 2020-04-16 ENCOUNTER — Encounter: Payer: Self-pay | Admitting: Family Medicine

## 2020-04-16 VITALS — BP 136/80 | HR 103 | Ht 61.5 in | Wt 222.0 lb

## 2020-04-16 DIAGNOSIS — F329 Major depressive disorder, single episode, unspecified: Secondary | ICD-10-CM | POA: Diagnosis not present

## 2020-04-16 DIAGNOSIS — R609 Edema, unspecified: Secondary | ICD-10-CM

## 2020-04-16 DIAGNOSIS — F419 Anxiety disorder, unspecified: Secondary | ICD-10-CM | POA: Diagnosis not present

## 2020-04-16 DIAGNOSIS — M546 Pain in thoracic spine: Secondary | ICD-10-CM | POA: Insufficient documentation

## 2020-04-16 DIAGNOSIS — F32A Depression, unspecified: Secondary | ICD-10-CM

## 2020-04-16 DIAGNOSIS — Z79899 Other long term (current) drug therapy: Secondary | ICD-10-CM | POA: Diagnosis not present

## 2020-04-16 DIAGNOSIS — R600 Localized edema: Secondary | ICD-10-CM | POA: Insufficient documentation

## 2020-04-16 DIAGNOSIS — M545 Low back pain, unspecified: Secondary | ICD-10-CM | POA: Insufficient documentation

## 2020-04-16 MED ORDER — CYCLOBENZAPRINE HCL 5 MG PO TABS: 5.0000 mg | ORAL_TABLET | Freq: Three times a day (TID) | ORAL | 1 refills | Status: DC | PRN

## 2020-04-16 MED ORDER — DULOXETINE HCL 60 MG PO CPEP: 60.0000 mg | ORAL_CAPSULE | Freq: Every day | ORAL | 3 refills | Status: DC

## 2020-04-16 MED ORDER — HYDROXYZINE HCL 25 MG PO TABS: 25.0000 mg | ORAL_TABLET | Freq: Three times a day (TID) | ORAL | 1 refills | Status: DC | PRN

## 2020-04-16 NOTE — Patient Instructions (Signed)

## 2020-04-16 NOTE — Progress Notes (Signed)
Having sever anxiety not sleeping. Has some swelling. Having pain in back and right shoulder.

## 2020-04-16 NOTE — Progress Notes (Signed)
Subjective:  Patient ID: Vicki Moore, female    DOB: 1980-10-20  Age: 40 y.o. MRN: [id number]  CC: New Patient (Initial Visit)   HPI Vicki Moore presents to establish care. States she lost her Grandmother a months ago and she is not dealing with it well. States "I am from [place]" but goes on to say she has been in [place] for the last 16 years, then "I met a guy whom  I was with for 10 years then he left me for a 39 year old" then breaks down crying. She complains of seeing behavioral health on [address] and did not like how she was treated. She had an ED visit on 03/30/2020 for anxiety but was not prescribed medication  Complains of swelling "everywhere coming and going for the last 8 months".  Unsure of weight gain; denies presence of dyspnea or chest pain. Has 'R shoulder blade pain' ; states it is chronic. Was on Percocet for years for this pain. Has back pain 'everywhere'. Broke her tail bone as she is an [place]. Also states she broker her feet when she jumped off the bed as birds got into her room when her boyfriend opened the windows. A year again she fell down the stairs.  Past Medical History:  Diagnosis Date  . Anxiety   . Asthma   . Asthma   . Depression   . GERD (gastroesophageal reflux disease)     Past Surgical History:  Procedure Laterality Date  . CHOLECYSTECTOMY N/A 12/08/2019   Procedure: LAPAROSCOPIC CHOLECYSTECTOMY;  Surgeon: Aviva Signs, MD;  Location: [facility];  Service: General;  Laterality: N/A;    No family history on file.  Allergies  Allergen Reactions  . Other Shortness Of Breath and Hives  . Peanut-Containing Drug Products Hives and Shortness Of Breath  . Penicillins Hives, Nausea And Vomiting and Anaphylaxis    Other reaction(s): RASH Other reaction(s): RASH     Outpatient Medications Prior to Visit  Medication Sig Dispense Refill  . ADVAIR DISKUS 500-50 MCG/DOSE AEPB Inhale 1 puff into the lungs 2 (two) times  daily.    Marland Kitchen albuterol (PROVENTIL HFA;VENTOLIN HFA) 108 (90 BASE) MCG/ACT inhaler Inhale 2 puffs into the lungs every 6 (six) hours as needed. For shortness of breath    . ferrous sulfate 325 (65 FE) MG tablet Take 1 tablet (325 mg total) by mouth daily with breakfast. 30 tablet 0  . ibuprofen (ADVIL) 200 MG tablet Take 400 mg by mouth every 6 (six) hours as needed for headache or mild pain.    Marland Kitchen ipratropium-albuterol (DUONEB) 0.5-2.5 (3) MG/3ML SOLN Take 3 mLs by nebulization 4 (four) times daily as needed for wheezing or shortness of breath.    . pantoprazole (PROTONIX) 40 MG tablet Take 1 tablet (40 mg total) by mouth 2 (two) times daily before a meal. 60 tablet 0  . senna-docusate (SENOKOT-S) 8.6-50 MG tablet Take 2 tablets by mouth at bedtime as needed for mild constipation or moderate constipation. 30 tablet 0  . alum & mag hydroxide-simeth (MAALOX/MYLANTA) 200-200-20 MG/5ML suspension Take 15 mLs by mouth every 6 (six) hours as needed for indigestion or heartburn. (Patient not taking: Reported on 04/16/2020)    . cyclobenzaprine (FLEXERIL) 5 MG tablet Take 1 tablet (5 mg total) by mouth 3 (three) times daily as needed for muscle spasms. (Patient not taking: Reported on 04/16/2020) 30 tablet 1   No facility-administered medications prior to visit.     ROS Review  of Systems  Constitutional: Negative for activity change, appetite change and fatigue.  HENT: Negative for congestion, sinus pressure and sore throat.   Eyes: Negative for visual disturbance.  Respiratory: Negative for cough, chest tightness, shortness of breath and wheezing.   Cardiovascular: Negative for chest pain and palpitations.  Gastrointestinal: Negative for abdominal distention, abdominal pain and constipation.  Endocrine: Negative for polydipsia.  Genitourinary: Negative for dysuria and frequency.  Musculoskeletal: Negative for arthralgias and back pain.       See HPI  Skin: Negative for rash.  Neurological: Negative for  tremors, light-headedness and numbness.  Hematological: Does not bruise/bleed easily.  Psychiatric/Behavioral: Negative for agitation and behavioral problems.       Positive for anxiety    Objective:  BP 136/80   Pulse (!) 103   Ht 5' 1.5" (1.562 m)   Wt 222 lb (100.7 kg)   SpO2 95%   BMI 41.27 kg/m   BP/Weight 04/16/2020 01/24/2020 62/95/2841  Systolic BP 324 401 -  Diastolic BP 80 72 -  Wt. (Lbs) 222 - 197.09  BMI 41.27 - 36.64      Physical Exam Constitutional:      Appearance: She is well-developed.  Neck:     Vascular: No JVD.  Cardiovascular:     Rate and Rhythm: Normal rate.     Heart sounds: Normal heart sounds. No murmur heard.   Pulmonary:     Effort: Pulmonary effort is normal.     Breath sounds: Normal breath sounds. No wheezing or rales.  Chest:     Chest wall: No tenderness.  Abdominal:     General: Bowel sounds are normal. There is no distension.     Palpations: Abdomen is soft. There is no mass.     Tenderness: There is no abdominal tenderness.  Musculoskeletal:        General: Normal range of motion.     Right lower leg: No edema.     Left lower leg: No edema.     Comments: No tenderness on palpation of entire spine, bilateral scapular region  Neurological:     Mental Status: She is alert and oriented to person, place, and time.  Psychiatric:        Mood and Affect: Mood is anxious.     CMP Latest Ref Rng & Units 01/23/2020 01/23/2020 11/15/2019  Glucose 70 - 99 mg/dL - 95 107(H)  BUN 6 - 20 mg/dL - 11 9  Creatinine 0.44 - 1.00 mg/dL - 0.77 0.80  Sodium 135 - 145 mmol/L 147(H) 147(H) 136  Potassium 3.5 - 5.1 mmol/L 4.1 4.1 3.6  Chloride 98 - 111 mmol/L - 114(H) 104  CO2 22 - 32 mmol/L - 21(L) 22  Calcium 8.9 - 10.3 mg/dL - 8.5(L) 9.2  Total Protein 6.5 - 8.1 g/dL - 5.8(L) 6.6  Total Bilirubin 0.3 - 1.2 mg/dL - 0.4 0.7  Alkaline Phos 38 - 126 U/L - 57 44  AST 15 - 41 U/L - 30 14(L)  ALT 0 - 44 U/L - 20 15    Lipid Panel  No results  found for: CHOL, TRIG, HDL, CHOLHDL, VLDL, LDLCALC, LDLDIRECT  CBC    Component Value Date/Time   WBC 8.0 01/23/2020 2202   RBC 4.13 01/23/2020 2202   HGB 10.9 (L) 01/23/2020 2224   HCT 32.0 (L) 01/23/2020 2224   PLT 221 01/23/2020 2202   MCV 87.4 01/23/2020 2202   MCH 25.2 (L) 01/23/2020 2202   MCHC 28.8 (L) 01/23/2020  2202   RDW 17.9 (H) 01/23/2020 2202   LYMPHSABS 2.2 01/23/2020 2202   MONOABS 0.5 01/23/2020 2202   EOSABS 0.4 01/23/2020 2202   BASOSABS 0.0 01/23/2020 2202    Lab Results  Component Value Date   HGBA1C 5.2 11/13/2019    Assessment & Plan:  1. Anxiety and depression Uncontrolled We will commence on Cymbalta and hydroxyzine Schedule with LCSW she will benefit from psychotherapy and grief counseling We will follow up in 6 weeks - DULoxetine (CYMBALTA) 60 MG capsule; Take 1 capsule (60 mg total) by mouth daily.  Dispense: 30 capsule; Refill: 3 - hydrOXYzine (ATARAX/VISTARIL) 25 MG tablet; Take 1 tablet (25 mg total) by mouth 3 (three) times daily as needed.  Dispense: 60 tablet; Refill: 1  2. Thoracolumbar back pain No pain elicited on my physical exam Hopefully Cymbalta will be beneficial with regards to pain Flexeril has been refilled If symptoms persist, consider PT - DULoxetine (CYMBALTA) 60 MG capsule; Take 1 capsule (60 mg total) by mouth daily.  Dispense: 30 capsule; Refill: 3 - cyclobenzaprine (FLEXERIL) 5 MG tablet; Take 1 tablet (5 mg total) by mouth 3 (three) times daily as needed for muscle spasms.  Dispense: 30 tablet; Refill: 1   3. Edema, unspecified type Not evident on exam - Brain natriuretic peptide   - cyclobenzaprine (FLEXERIL) 5 MG tablet; Take 1 tablet (5 mg total) by mouth 3 (three) times daily as needed for muscle spasms.  Dispense: 30 tablet; Refill: 1    Meds ordered this encounter  Medications  . DULoxetine (CYMBALTA) 60 MG capsule    Sig: Take 1 capsule (60 mg total) by mouth daily.    Dispense:  30 capsule    Refill:   3  . hydrOXYzine (ATARAX/VISTARIL) 25 MG tablet    Sig: Take 1 tablet (25 mg total) by mouth 3 (three) times daily as needed.    Dispense:  60 tablet    Refill:  1  . cyclobenzaprine (FLEXERIL) 5 MG tablet    Sig: Take 1 tablet (5 mg total) by mouth 3 (three) times daily as needed for muscle spasms.    Dispense:  30 tablet    Refill:  1    Follow-up: Return for [facility], next available; PCP 6 weeks.       Charlott Rakes, MD, FAAFP. [facility] and [place], [place]   04/16/2020, 1:28 PM

## 2020-04-17 LAB — BRAIN NATRIURETIC PEPTIDE: BNP: 77.5 pg/mL (ref 0.0–100.0)

## 2020-04-18 ENCOUNTER — Telehealth: Payer: Self-pay

## 2020-04-18 NOTE — Telephone Encounter (Signed)
Pt was called and a Vm was left informing pt to return phone call. 

## 2020-04-18 NOTE — Telephone Encounter (Signed)
Pt returned call about lab results/ please advise

## 2020-04-18 NOTE — Telephone Encounter (Signed)
Patient was called and a voicemail was left informing patient to return phone call for lab results.   Normal results letter has been mailed to patient. 

## 2020-04-18 NOTE — Telephone Encounter (Signed)
-----   Message from [name], MD sent at 04/18/2020  1:26 PM EST ----- Please inform her that her blood test is normal

## 2020-04-23 ENCOUNTER — Encounter (HOSPITAL_COMMUNITY): Payer: Self-pay | Admitting: Emergency Medicine

## 2020-04-23 ENCOUNTER — Emergency Department (HOSPITAL_COMMUNITY): Payer: Medicaid Other

## 2020-04-23 ENCOUNTER — Emergency Department (HOSPITAL_COMMUNITY)
Admission: EM | Admit: 2020-04-23 | Discharge: 2020-04-23 | Disposition: A | Discharge status: Home / Self Care | Payer: Medicaid Other | Attending: Emergency Medicine | Admitting: Emergency Medicine

## 2020-04-23 DIAGNOSIS — R7989 Other specified abnormal findings of blood chemistry: Secondary | ICD-10-CM

## 2020-04-23 DIAGNOSIS — R1011 Right upper quadrant pain: Secondary | ICD-10-CM | POA: Diagnosis not present

## 2020-04-23 DIAGNOSIS — F17210 Nicotine dependence, cigarettes, uncomplicated: Secondary | ICD-10-CM | POA: Diagnosis not present

## 2020-04-23 DIAGNOSIS — Z9049 Acquired absence of other specified parts of digestive tract: Secondary | ICD-10-CM | POA: Diagnosis not present

## 2020-04-23 DIAGNOSIS — J45901 Unspecified asthma with (acute) exacerbation: Secondary | ICD-10-CM | POA: Insufficient documentation

## 2020-04-23 DIAGNOSIS — Z91010 Allergy to peanuts: Secondary | ICD-10-CM | POA: Insufficient documentation

## 2020-04-23 DIAGNOSIS — R079 Chest pain, unspecified: Secondary | ICD-10-CM

## 2020-04-23 DIAGNOSIS — U071 COVID-19: Secondary | ICD-10-CM | POA: Diagnosis not present

## 2020-04-23 DIAGNOSIS — R0789 Other chest pain: Secondary | ICD-10-CM | POA: Diagnosis not present

## 2020-04-23 DIAGNOSIS — M25511 Pain in right shoulder: Secondary | ICD-10-CM | POA: Insufficient documentation

## 2020-04-23 DIAGNOSIS — R945 Abnormal results of liver function studies: Secondary | ICD-10-CM | POA: Insufficient documentation

## 2020-04-23 DIAGNOSIS — I10 Essential (primary) hypertension: Secondary | ICD-10-CM | POA: Diagnosis not present

## 2020-04-23 DIAGNOSIS — M549 Dorsalgia, unspecified: Secondary | ICD-10-CM | POA: Insufficient documentation

## 2020-04-23 DIAGNOSIS — R918 Other nonspecific abnormal finding of lung field: Secondary | ICD-10-CM | POA: Diagnosis not present

## 2020-04-23 DIAGNOSIS — R059 Cough, unspecified: Secondary | ICD-10-CM | POA: Diagnosis not present

## 2020-04-23 DIAGNOSIS — K760 Fatty (change of) liver, not elsewhere classified: Secondary | ICD-10-CM | POA: Diagnosis not present

## 2020-04-23 DIAGNOSIS — Z20828 Contact with and (suspected) exposure to other viral communicable diseases: Secondary | ICD-10-CM | POA: Diagnosis not present

## 2020-04-23 DIAGNOSIS — R112 Nausea with vomiting, unspecified: Secondary | ICD-10-CM | POA: Diagnosis not present

## 2020-04-23 DIAGNOSIS — R0989 Other specified symptoms and signs involving the circulatory and respiratory systems: Secondary | ICD-10-CM | POA: Diagnosis not present

## 2020-04-23 DIAGNOSIS — K449 Diaphragmatic hernia without obstruction or gangrene: Secondary | ICD-10-CM | POA: Diagnosis not present

## 2020-04-23 LAB — I-STAT BETA HCG BLOOD, ED (MC, WL, AP ONLY): I-stat hCG, quantitative: <5 m[IU]/mL (ref <5)

## 2020-04-23 LAB — CBC
HCT: 35.9 % — ABNORMAL LOW (ref 36.0–46.0)
Hemoglobin: 11 g/dL — ABNORMAL LOW (ref 12.0–15.0)
MCH: 24.9 pg — ABNORMAL LOW (ref 26.0–34.0)
MCHC: 30.6 g/dL (ref 30.0–36.0)
MCV: 81.4 fL (ref 80.0–100.0)
Platelets: 284 10*3/uL (ref 150–400)
RBC: 4.41 MIL/uL (ref 3.87–5.11)
RDW: 18.6 % — ABNORMAL HIGH (ref 11.5–15.5)
WBC: 6.1 10*3/uL (ref 4.0–10.5)
nRBC: 0 % (ref 0.0–0.2)

## 2020-04-23 LAB — COMPREHENSIVE METABOLIC PANEL
ALT: 159 U/L — ABNORMAL HIGH (ref 0–44)
AST: 202 U/L — ABNORMAL HIGH (ref 15–41)
Albumin: 4.8 g/dL (ref 3.5–5.0)
Alkaline Phosphatase: 168 U/L — ABNORMAL HIGH (ref 38–126)
Anion gap: 16 — ABNORMAL HIGH (ref 5–15)
BUN: 10 mg/dL (ref 6–20)
CO2: 27 mmol/L (ref 22–32)
Calcium: 9.4 mg/dL (ref 8.9–10.3)
Chloride: 93 mmol/L — ABNORMAL LOW (ref 98–111)
Creatinine, Ser: 0.87 mg/dL (ref 0.44–1.00)
GFR, Estimated: >60 mL/min (ref >60)
Glucose, Bld: 95 mg/dL (ref 70–99)
Potassium: 3.7 mmol/L (ref 3.5–5.1)
Sodium: 136 mmol/L (ref 135–145)
Total Bilirubin: 0.8 mg/dL (ref 0.3–1.2)
Total Protein: 7.8 g/dL (ref 6.5–8.1)

## 2020-04-23 LAB — POC SARS CORONAVIRUS 2 AG -  ED: SARS Coronavirus 2 Ag: POSITIVE — AB

## 2020-04-23 LAB — TROPONIN I (HIGH SENSITIVITY): Troponin I (High Sensitivity): 3 ng/L (ref <18)

## 2020-04-23 LAB — LIPASE, BLOOD: Lipase: 39 U/L (ref 11–51)

## 2020-04-23 MED ORDER — DROPERIDOL 2.5 MG/ML IJ SOLN
1.2500 mg | Freq: Once | INTRAMUSCULAR | Status: AC
  Administered 2020-04-23: 1.25 mg via INTRAVENOUS
  Filled 2020-04-23 (×2): qty 2

## 2020-04-23 MED ORDER — MORPHINE SULFATE (PF) 4 MG/ML IV SOLN
4.0000 mg | Freq: Once | INTRAVENOUS | Status: AC
  Administered 2020-04-23: 4 mg via INTRAVENOUS
  Filled 2020-04-23: qty 1

## 2020-04-23 MED ORDER — PROMETHAZINE HCL 25 MG RE SUPP: 25.0000 mg | Freq: Four times a day (QID) | RECTAL | 0 refills | Status: DC | PRN

## 2020-04-23 MED ORDER — PROMETHAZINE HCL 25 MG/ML IJ SOLN
12.5000 mg | Freq: Once | INTRAMUSCULAR | Status: AC
  Administered 2020-04-23: 12.5 mg via INTRAVENOUS
  Filled 2020-04-23: qty 1

## 2020-04-23 MED ORDER — BENZONATATE 100 MG PO CAPS: 100.0000 mg | ORAL_CAPSULE | Freq: Three times a day (TID) | ORAL | 0 refills | Status: DC

## 2020-04-23 MED ORDER — PROMETHAZINE HCL 25 MG PO TABS: 25.0000 mg | ORAL_TABLET | Freq: Four times a day (QID) | ORAL | 0 refills | Status: DC | PRN

## 2020-04-23 MED ORDER — KETOROLAC TROMETHAMINE 30 MG/ML IJ SOLN
30.0000 mg | Freq: Once | INTRAMUSCULAR | Status: AC
  Administered 2020-04-23: 30 mg via INTRAVENOUS
  Filled 2020-04-23: qty 1

## 2020-04-23 MED ORDER — ONDANSETRON HCL 4 MG/2ML IJ SOLN
4.0000 mg | Freq: Once | INTRAMUSCULAR | Status: AC
  Administered 2020-04-23: 4 mg via INTRAVENOUS
  Filled 2020-04-23: qty 2

## 2020-04-23 MED ORDER — MORPHINE SULFATE (PF) 4 MG/ML IV SOLN
6.0000 mg | Freq: Once | INTRAVENOUS | Status: AC
Start: 2020-04-23 — End: 2020-04-23
  Administered 2020-04-23: 6 mg via INTRAVENOUS
  Filled 2020-04-23: qty 2

## 2020-04-23 MED ORDER — IOHEXOL 350 MG/ML SOLN
100.0000 mL | Freq: Once | INTRAVENOUS | Status: AC | PRN
  Administered 2020-04-23: 100 mL via INTRAVENOUS

## 2020-04-23 MED ORDER — SODIUM CHLORIDE 0.9 % IV BOLUS
1000.0000 mL | Freq: Once | INTRAVENOUS | Status: AC
  Administered 2020-04-23: 1000 mL via INTRAVENOUS

## 2020-04-23 MED ORDER — DICYCLOMINE HCL 20 MG PO TABS: 20.0000 mg | ORAL_TABLET | Freq: Three times a day (TID) | ORAL | 0 refills | Status: DC | PRN

## 2020-04-23 MED ORDER — ALBUTEROL SULFATE HFA 108 (90 BASE) MCG/ACT IN AERS
4.0000 | INHALATION_SPRAY | Freq: Once | RESPIRATORY_TRACT | Status: AC
  Administered 2020-04-23: 4 via RESPIRATORY_TRACT
  Filled 2020-04-23: qty 6.7

## 2020-04-23 MED ORDER — LORAZEPAM 2 MG/ML IJ SOLN
1.0000 mg | Freq: Once | INTRAMUSCULAR | Status: AC
  Administered 2020-04-23: 1 mg via INTRAVENOUS
  Filled 2020-04-23: qty 1

## 2020-04-23 NOTE — ED Triage Notes (Signed)
Per EMS-covid symptoms for 2 days-body aches, cough, chills, unvaccinated-nausea, vomiting-Zofran 4mg  and Goody's powder 1 hour ago

## 2020-04-23 NOTE — ED Provider Notes (Signed)
Patient care assumed at 1500. Patient with COVID-19 infection here with vomiting, chest pain. CT PE as well as CT abdomen pelvis pending at time of handoff.  CT is negative for PE. CT abdomen and pelvis is negative for acute obstruction or acute bacterial infection. Imaging with possible atypical infection, this is likely secondary to COVID-19. Doubt acute bacterial process at this time. Plan to treat symptomatically with antiemetics, oral fluid hydration. Discussed with patient findings of studies.   Tilden Fossa, MD 04/23/20 Windell Moment

## 2020-04-23 NOTE — ED Provider Notes (Signed)
Emergency Department Provider Note   I have reviewed the triage vital signs and the nursing notes.   HISTORY  Chief Complaint covid symptoms   HPI Vicki Moore is a 40 y.o. female presents to the emergency department for evaluation of COVID symptoms over the past 2 days.  She has body aches, cough, chills along with vomiting and diarrhea.  She has severe pain of the back of her right shoulder along with some central chest heaviness she states feels like someone sitting on her chest.  Pain is constant and nonradiating.  Is not having specific shortness of breath.  She tried a Goody's powder at home and took Zofran but vomited up.  She arrives by EMS with normal vital signs in route per their report. No UTI symptoms.    Past Medical History:  Diagnosis Date  . Anxiety   . Asthma   . Asthma   . Depression   . GERD (gastroesophageal reflux disease)     Patient Active Problem List   Diagnosis Date Noted  . Anxiety disorder   . Nephrolithiasis 01/10/2020  . Biliary colic   . Right upper quadrant pain 11/08/2019  . RUQ pain 11/07/2019  . GERD (gastroesophageal reflux disease) 11/07/2019  . Bilateral foot pain 12/08/2017  . 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 Hypoxemia 08/08/2017  . Unspecified asthma with (acute) exacerbation 08/08/2017  . Influenza A with pneumonia 04/29/2017  . 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 Drug overdose 04/23/2017  . Polysubstance abuse (HCC) 04/23/2017  . Anxiety state 03/22/2009  . Asthma 01/02/2009  . LOW BACK PAIN 01/02/2009    Past Surgical History:  Procedure Laterality Date  . CHOLECYSTECTOMY N/A 12/08/2019   Procedure: LAPAROSCOPIC CHOLECYSTECTOMY;  Surgeon: Franky Macho, MD;  Location: [facility];  Service: General;  Laterality: N/A;    Allergies Other, Peanut-containing drug products, and Penicillins  No family history on file.  Social History Social History   Tobacco Use  . Smoking status: Current Some  Day Smoker    Packs/day: 0.25    Years: 10.00    Pack years: 2.50    Types: Cigarettes  . Smokeless tobacco: Never Used  Vaping Use  . Vaping Use: Never used  Substance Use Topics  . Alcohol use: Yes    Comment: rare  . Drug use: Not Currently    Review of Systems  Constitutional: Positive fever, chills, and body aches.  Eyes: No visual changes. ENT: No sore throat. Cardiovascular: Positive chest pain. Respiratory: Denies shortness of breath. Positive cough.  Gastrointestinal: Positive abdominal pain. Positive nausea, vomiting, and diarrhea.  No constipation. Genitourinary: Negative for dysuria. Musculoskeletal: Negative for back pain. Positive right shoulder pain (posterior).  Skin: Rash under the bilateral breasts.  Neurological: Negative for focal weakness or numbness. Positive HA.   10-point ROS otherwise negative.  ____________________________________________   PHYSICAL EXAM:  VITAL SIGNS: ED Triage Vitals  Enc Vitals Group     BP 04/23/20 1015 129/79     Pulse Rate 04/23/20 1015 96     Resp 04/23/20 1015 14     Temp 04/23/20 1015 98.8 F (37.1 C)     Temp Source 04/23/20 1015 Oral     SpO2 04/23/20 1015 98 %     Weight 04/23/20 1014 190 lb (86.2 kg)     Height 04/23/20 1014 5\' 1"  (1.549 m)   Constitutional: Alert and oriented. Well appearing and in no acute distress. Eyes: Conjunctivae are normal.  Head: Atraumatic.  Nose: No congestion/rhinnorhea. Mouth/Throat: Mucous membranes are moist.   Neck: No stridor.  Cardiovascular: Normal rate, regular rhythm. Good peripheral circulation. Grossly normal heart sounds.   Respiratory: Normal respiratory effort.  No retractions. Lungs with end-expiratory wheezing bilaterally.  Gastrointestinal: Soft and nontender. No distention.  Musculoskeletal: No lower extremity tenderness nor edema. No gross deformities of extremities. Neurologic:  Normal speech and language. No gross focal neurologic deficits are appreciated.   Skin:  Skin is warm and dry.  Candidal type rash under the bilateral breasts. No abscess.    ____________________________________________   LABS (all labs ordered are listed, but only abnormal results are displayed)  Labs Reviewed  COMPREHENSIVE METABOLIC PANEL - Abnormal; Notable for the following components:      Result Value   Chloride 93 (*)    AST 202 (*)    ALT 159 (*)    Alkaline Phosphatase 168 (*)    Anion gap 16 (*)    All other components within normal limits  CBC - Abnormal; Notable for the following components:   Hemoglobin 11.0 (*)    HCT 35.9 (*)    MCH 24.9 (*)    RDW 18.6 (*)    All other components within normal limits  POC SARS CORONAVIRUS 2 AG -  ED - Abnormal; Notable for the following components:   SARS Coronavirus 2 Ag POSITIVE (*)    All other components within normal limits  LIPASE, BLOOD  URINALYSIS, ROUTINE W REFLEX MICROSCOPIC  I-STAT BETA HCG BLOOD, ED ([facility], [facility], [facility] ONLY)  TROPONIN I (HIGH SENSITIVITY)   ____________________________________________  EKG   EKG Interpretation  Date/Time:  Tuesday April 23 2020 10:56:40 EST Ventricular Rate:  87 PR Interval:    QRS Duration: 104 QT Interval:  399 QTC Calculation: 480 R Axis:   21 Text Interpretation: Sinus rhythm Ventricular preexcitation(WPW) Confirmed by Alona Bene [phone]) on 04/23/2020 3:57:38 PM       ____________________________________________  RADIOLOGY  DG Chest Portable 1 View  Result Date: 04/23/2020 CLINICAL DATA:  Cough, congestion, shortness of breath and body aches for 2 days. EXAM: PORTABLE CHEST 1 VIEW COMPARISON:  Single-view of the chest 01/23/2020. CT chest 11/09/2019. FINDINGS: The lungs are clear. Heart size is normal. No pneumothorax or pleural fluid. No acute or focal bony abnormality. IMPRESSION: Negative chest. Electronically Signed   By: Drusilla Kanner M.D.   On: 04/23/2020 11:31   US Abdomen Limited RUQ (LIVER/GB)  Result Date: 04/23/2020 CLINICAL  DATA:  Right upper quadrant pain, elevated liver function tests EXAM: ULTRASOUND ABDOMEN LIMITED RIGHT UPPER QUADRANT COMPARISON:  11/05/2019 FINDINGS: Examination is somewhat limited secondary to patient cooperation. Patient requested to prematurely terminate the exam. Gallbladder: Surgically absent. Common bile duct: Diameter: 6 mm. Liver: No focal lesion identified. Within normal limits in parenchymal echogenicity. Portal vein is patent on color Doppler imaging with normal direction of blood flow towards the liver. Other: None. IMPRESSION: 1. Limited exam. 2. No acute findings within the right upper quadrant. Status post cholecystectomy. Electronically Signed   By: Duanne Guess D.O.   On: 04/23/2020 13:19    ____________________________________________   PROCEDURES  Procedure(s) performed:   Procedures  None ____________________________________________   INITIAL IMPRESSION / ASSESSMENT AND PLAN / ED COURSE  Pertinent labs & imaging results that were available during my care of the patient were reviewed by me and considered in my medical decision making (see chart for details).   Patient presents to the emergency department with primarily COVID-like symptoms over the  past 2 days.  He is having pain in the right shoulder but normal range of motion of the joint.  Doubt septic arthritis clinically.  No sign of injury.  Plan for chest x-ray along with nausea medication and fluids.  Patient has wheezing on exam but no hypoxemia.  Will treat with albuterol inhaler and reassess after labs and COVID antigen test come back.  Patient does have a candidal infection under the bilateral breasts. No SIRS vitals or concern for developing sepsis.   LFTs elevated with normal RUQ [place]. Patient with continued vomiting and tachycardia as well and posterior right chest wall pain. Plan for CTA to r/o PE and CT abdomen/pelvis with intractable nausea/vomiting. Suspect some degree of opiate withdrawal. Patient  reports running out of Percocet 1 week prior which may be complicating/amplifying COVID symptoms. No hypoxemia. Will treat pain/nausea. Care transferred to Dr. Madilyn Hook.  ____________________________________________  FINAL CLINICAL IMPRESSION(S) / ED DIAGNOSES  Final diagnoses:  RUQ pain  Elevated LFTs  Chest pain, unspecified type     MEDICATIONS GIVEN DURING THIS VISIT:  Medications  iohexol (OMNIPAQUE) 350 MG/ML injection 100 mL (has no administration in time range)  sodium chloride 0.9 % bolus 1,000 mL (0 mLs Intravenous Stopped 04/23/20 1519)  ondansetron (ZOFRAN) injection 4 mg (4 mg Intravenous Given 04/23/20 1200)  ketorolac (TORADOL) 30 MG/ML injection 30 mg (30 mg Intravenous Given 04/23/20 1200)  albuterol (VENTOLIN HFA) 108 (90 Base) MCG/ACT inhaler 4 puff (4 puffs Inhalation Given 04/23/20 1224)  morphine 4 MG/ML injection 4 mg (4 mg Intravenous Given 04/23/20 1254)  promethazine (PHENERGAN) injection 12.5 mg (12.5 mg Intravenous Given 04/23/20 1330)  morphine 4 MG/ML injection 6 mg (6 mg Intravenous Given 04/23/20 1330)  LORazepam (ATIVAN) injection 1 mg (1 mg Intravenous Given 04/23/20 1415)  promethazine (PHENERGAN) injection 12.5 mg (12.5 mg Intravenous Given 04/23/20 1554)  droperidol (INAPSINE) 2.5 MG/ML injection 1.25 mg (1.25 mg Intravenous Given 04/23/20 1610)    Note:  This document was prepared using Dragon voice recognition software and may include unintentional dictation errors.  Alona Bene, MD, [facility] Emergency Medicine    Mao Lockner, Arlyss Repress, MD 04/23/20 [phone]

## 2020-04-23 NOTE — ED Notes (Signed)
Pt verbalized dc instructions and follow up care. Alert and assisted out using wheelchair. No iv. Husband is picking pt up

## 2020-04-23 NOTE — ED Notes (Signed)
POC COVID GIVEN TO LONG POS.

## 2020-04-23 NOTE — ED Notes (Signed)
Attempted for IV x2 w/ [place] machine. Unsuccessful.

## 2020-04-23 NOTE — Discharge Instructions (Addendum)
You were seen in the ED today with COVID 19 symptoms. I have called in several medications to help with symptoms. Your liver enzymes are elevated and need repeat blood work in the next 7 days. Remain in isolation for the next 5 days. If you symptoms are improving and you are not having fever you can leave isolation and wear a mask.

## 2020-04-26 ENCOUNTER — Ambulatory Visit: Payer: Self-pay | Admitting: *Deleted

## 2020-04-26 NOTE — Telephone Encounter (Signed)
Pt returning cal lfor Lab Results / please advise   Patient is calling with several concerns:  Patient is calling for lab results: advised normal and she should be getting letter regarding that.  Patient states she has tested + COVID- she was informed that her hemoglobin is low from ED visit. Discussed reasons for low hemoglobin in women: heavy menstrual cycles, GI reasons, etc. She does report heavy cycles recently and will discuss controlling that at next visit.  Patient states she would like PCP to start prescribing her anxiety medication- she is out - her previous prescriber does not have [facility] privileges anymore. Advised would send request- but because this is controled substance - she may need appointment to discuss use. She ask for help with sleep.  Reason for Disposition . [1] Caller requesting NON-URGENT health information AND [2] PCP's office is the best resource  Protocols used: INFORMATION ONLY CALL - NO TRIAGE-A-AH

## 2020-04-29 ENCOUNTER — Institutional Professional Consult (permissible substitution): Payer: Medicaid Other | Admitting: Licensed Clinical Social Worker

## 2020-04-29 NOTE — Telephone Encounter (Signed)
Call placed to pt and VM left informing her to return phone call.

## 2020-06-05 ENCOUNTER — Ambulatory Visit: Payer: Medicaid Other | Admitting: Family Medicine

## 2020-06-11 ENCOUNTER — Telehealth: Payer: Self-pay | Admitting: Family Medicine

## 2020-06-11 NOTE — Telephone Encounter (Signed)
Pt called and states that she is having some pain where she had her gallbladder taken out. Her gallbladder was removed August 2021. She states that she is vomiting everyday and bile is coming up with it and she thinks she is having a problem with her liver. (because of some labs another physician drew)?). She states that she discussed these issued with her PCP that is in [place] and she only sees him through telemed and he recommended that she follow up with Dr. Lovell Sheehan. I did inform her that it does not sound like she is having any kind of reaction from her surgical procedure that she should follow up with her PCP. She insisted that she see Dr. Lovell Sheehan to talk to him. I did make an apt and recommended if she gets worse to go to the ER.

## 2020-06-18 ENCOUNTER — Encounter: Payer: Self-pay | Admitting: General Surgery

## 2020-06-18 ENCOUNTER — Ambulatory Visit (INDEPENDENT_AMBULATORY_CARE_PROVIDER_SITE_OTHER): Payer: Medicaid Other | Admitting: General Surgery

## 2020-06-18 ENCOUNTER — Other Ambulatory Visit: Payer: Self-pay

## 2020-06-18 VITALS — BP 114/81 | HR 118 | Temp 98.0°F | Resp 14 | Ht 61.5 in | Wt 212.0 lb

## 2020-06-18 DIAGNOSIS — R748 Abnormal levels of other serum enzymes: Secondary | ICD-10-CM

## 2020-06-19 NOTE — Progress Notes (Signed)
Subjective:     Vicki Moore  Patient is a 40 year old white female status post laparoscopic cholecystectomy and August 2021.  She presents with right-sided abdominal pain.  This seems to come and go but is made worse with movement.  She also states she has episodes of upper abdominal pain and nausea.  She is very tearful.  She was told recently by her primary care doctor that her liver enzyme tests were elevated.  She was not referred to a gastroenterologist. Objective:    BP 114/81    Pulse (!) 118    Temp 98 F (36.7 C) (Other (Comment))    Resp 14    Ht 5' 1.5" (1.562 m)    Wt 212 lb (96.2 kg)    SpO2 94%    BMI 39.41 kg/m   General:   Anxious white female  Head is normocephalic, atraumatic Eyes are without scleral icterus Lungs clear to auscultation with a good breath sounds bilaterally Heart examination reveals a regular rate and rhythm without S3, S4, murmurs Abdomen is soft and nondistended.  She points to an area along the lateral right costal margin that is point tender.  All surgical incisions have healed well.  No rigidity is noted.  Liver enzyme tests are noted to be elevated, though her total bilirubin is within normal limits. Epic chart reviewed including ER notes     Assessment:    Right-sided abdominal/costal margin pain of unknown etiology.   I do not find a surgical etiology for her pain.  Her elevated liver enzyme test were noted to be elevated, etiology of which is unknown.  She became tearful when I told her I could not prescribe narcotics or anxiolytics.  She states that she does not see Dr. Tollie Eth for pain management anymore.   Plan:   I strongly recommended that she return to her primary care physician for referral to gastroenterology as she is [place] access Medicaid.  She was not happy that I was not prescribing any pain medication for her.  I told her that she did not have a surgical issue for which I can treat at this time.

## 2020-08-03 ENCOUNTER — Inpatient Hospital Stay (HOSPITAL_COMMUNITY)
Admission: EM | Admit: 2020-08-03 | Discharge: 2020-08-06 | DRG: 917 | Disposition: A | Discharge status: Home / Self Care | Payer: Medicaid Other | Attending: Internal Medicine | Admitting: Internal Medicine

## 2020-08-03 ENCOUNTER — Other Ambulatory Visit: Payer: Self-pay

## 2020-08-03 ENCOUNTER — Emergency Department (HOSPITAL_COMMUNITY): Payer: Medicaid Other

## 2020-08-03 ENCOUNTER — Encounter (HOSPITAL_COMMUNITY): Payer: Self-pay | Admitting: Emergency Medicine

## 2020-08-03 DIAGNOSIS — Z79891 Long term (current) use of opiate analgesic: Secondary | ICD-10-CM | POA: Diagnosis not present

## 2020-08-03 DIAGNOSIS — G8929 Other chronic pain: Secondary | ICD-10-CM | POA: Diagnosis present

## 2020-08-03 DIAGNOSIS — Z6841 Body Mass Index (BMI) 40.0 and over, adult: Secondary | ICD-10-CM

## 2020-08-03 DIAGNOSIS — K219 Gastro-esophageal reflux disease without esophagitis: Secondary | ICD-10-CM | POA: Diagnosis present

## 2020-08-03 DIAGNOSIS — T6591XA Toxic effect of unspecified substance, accidental (unintentional), initial encounter: Secondary | ICD-10-CM | POA: Diagnosis present

## 2020-08-03 DIAGNOSIS — F1320 Sedative, hypnotic or anxiolytic dependence, uncomplicated: Secondary | ICD-10-CM | POA: Diagnosis not present

## 2020-08-03 DIAGNOSIS — E876 Hypokalemia: Secondary | ICD-10-CM | POA: Diagnosis present

## 2020-08-03 DIAGNOSIS — Z880 Allergy status to penicillin: Secondary | ICD-10-CM

## 2020-08-03 DIAGNOSIS — T40604A Poisoning by unspecified narcotics, undetermined, initial encounter: Secondary | ICD-10-CM

## 2020-08-03 DIAGNOSIS — F32A Depression, unspecified: Secondary | ICD-10-CM

## 2020-08-03 DIAGNOSIS — M546 Pain in thoracic spine: Secondary | ICD-10-CM

## 2020-08-03 DIAGNOSIS — Y907 Blood alcohol level of 200-239 mg/100 ml: Secondary | ICD-10-CM | POA: Diagnosis present

## 2020-08-03 DIAGNOSIS — T40601A Poisoning by unspecified narcotics, accidental (unintentional), initial encounter: Principal | ICD-10-CM | POA: Diagnosis present

## 2020-08-03 DIAGNOSIS — Z91010 Allergy to peanuts: Secondary | ICD-10-CM | POA: Diagnosis not present

## 2020-08-03 DIAGNOSIS — R404 Transient alteration of awareness: Secondary | ICD-10-CM | POA: Diagnosis not present

## 2020-08-03 DIAGNOSIS — J45909 Unspecified asthma, uncomplicated: Secondary | ICD-10-CM | POA: Diagnosis present

## 2020-08-03 DIAGNOSIS — G894 Chronic pain syndrome: Secondary | ICD-10-CM | POA: Diagnosis not present

## 2020-08-03 DIAGNOSIS — Z79899 Other long term (current) drug therapy: Secondary | ICD-10-CM

## 2020-08-03 DIAGNOSIS — F331 Major depressive disorder, recurrent, moderate: Secondary | ICD-10-CM | POA: Diagnosis present

## 2020-08-03 DIAGNOSIS — F13939 Sedative, hypnotic or anxiolytic use, unspecified with withdrawal, unspecified: Secondary | ICD-10-CM | POA: Diagnosis present

## 2020-08-03 DIAGNOSIS — R000 Tachycardia, unspecified: Secondary | ICD-10-CM | POA: Diagnosis not present

## 2020-08-03 DIAGNOSIS — Z20822 Contact with and (suspected) exposure to covid-19: Secondary | ICD-10-CM | POA: Diagnosis present

## 2020-08-03 DIAGNOSIS — F17210 Nicotine dependence, cigarettes, uncomplicated: Secondary | ICD-10-CM | POA: Diagnosis present

## 2020-08-03 DIAGNOSIS — T50904A Poisoning by unspecified drugs, medicaments and biological substances, undetermined, initial encounter: Secondary | ICD-10-CM | POA: Diagnosis not present

## 2020-08-03 DIAGNOSIS — F13239 Sedative, hypnotic or anxiolytic dependence with withdrawal, unspecified: Secondary | ICD-10-CM | POA: Diagnosis present

## 2020-08-03 DIAGNOSIS — F411 Generalized anxiety disorder: Secondary | ICD-10-CM | POA: Diagnosis present

## 2020-08-03 DIAGNOSIS — Z7951 Long term (current) use of inhaled steroids: Secondary | ICD-10-CM | POA: Diagnosis not present

## 2020-08-03 DIAGNOSIS — Z720 Tobacco use: Secondary | ICD-10-CM | POA: Diagnosis present

## 2020-08-03 DIAGNOSIS — F13930 Sedative, hypnotic or anxiolytic use, unspecified with withdrawal, uncomplicated: Secondary | ICD-10-CM

## 2020-08-03 DIAGNOSIS — F1010 Alcohol abuse, uncomplicated: Secondary | ICD-10-CM | POA: Diagnosis present

## 2020-08-03 DIAGNOSIS — T40605A Adverse effect of unspecified narcotics, initial encounter: Secondary | ICD-10-CM | POA: Diagnosis present

## 2020-08-03 DIAGNOSIS — R0902 Hypoxemia: Secondary | ICD-10-CM | POA: Diagnosis not present

## 2020-08-03 DIAGNOSIS — F1910 Other psychoactive substance abuse, uncomplicated: Secondary | ICD-10-CM | POA: Diagnosis present

## 2020-08-03 DIAGNOSIS — G928 Other toxic encephalopathy: Secondary | ICD-10-CM | POA: Diagnosis present

## 2020-08-03 DIAGNOSIS — R4020 Unspecified coma: Secondary | ICD-10-CM | POA: Diagnosis not present

## 2020-08-03 DIAGNOSIS — E1165 Type 2 diabetes mellitus with hyperglycemia: Secondary | ICD-10-CM | POA: Diagnosis not present

## 2020-08-03 DIAGNOSIS — M545 Low back pain, unspecified: Secondary | ICD-10-CM

## 2020-08-03 DIAGNOSIS — F13230 Sedative, hypnotic or anxiolytic dependence with withdrawal, uncomplicated: Secondary | ICD-10-CM

## 2020-08-03 DIAGNOSIS — R7401 Elevation of levels of liver transaminase levels: Secondary | ICD-10-CM

## 2020-08-03 DIAGNOSIS — R079 Chest pain, unspecified: Secondary | ICD-10-CM | POA: Diagnosis not present

## 2020-08-03 DIAGNOSIS — J811 Chronic pulmonary edema: Secondary | ICD-10-CM | POA: Diagnosis not present

## 2020-08-03 DIAGNOSIS — T50901A Poisoning by unspecified drugs, medicaments and biological substances, accidental (unintentional), initial encounter: Secondary | ICD-10-CM | POA: Diagnosis not present

## 2020-08-03 LAB — COMPREHENSIVE METABOLIC PANEL
ALT: 125 U/L — ABNORMAL HIGH (ref 0–44)
AST: 237 U/L — ABNORMAL HIGH (ref 15–41)
Albumin: 3.7 g/dL (ref 3.5–5.0)
Alkaline Phosphatase: 106 U/L (ref 38–126)
Anion gap: 15 (ref 5–15)
BUN: 14 mg/dL (ref 6–20)
CO2: 20 mmol/L — ABNORMAL LOW (ref 22–32)
Calcium: 8.4 mg/dL — ABNORMAL LOW (ref 8.9–10.3)
Chloride: 109 mmol/L (ref 98–111)
Creatinine, Ser: 0.8 mg/dL (ref 0.44–1.00)
GFR, Estimated: >60 mL/min (ref >60)
Glucose, Bld: 136 mg/dL — ABNORMAL HIGH (ref 70–99)
Potassium: 3.1 mmol/L — ABNORMAL LOW (ref 3.5–5.1)
Sodium: 144 mmol/L (ref 135–145)
Total Bilirubin: 0.2 mg/dL — ABNORMAL LOW (ref 0.3–1.2)
Total Protein: 7 g/dL (ref 6.5–8.1)

## 2020-08-03 LAB — RAPID URINE DRUG SCREEN, HOSP PERFORMED
Amphetamines: NOT DETECTED
Barbiturates: NOT DETECTED
Benzodiazepines: NOT DETECTED
Cocaine: NOT DETECTED
Opiates: POSITIVE — AB
Tetrahydrocannabinol: NOT DETECTED

## 2020-08-03 LAB — CBC WITH DIFFERENTIAL/PLATELET
Abs Immature Granulocytes: 0.04 10*3/uL (ref 0.00–0.07)
Basophils Absolute: 0 10*3/uL (ref 0.0–0.1)
Basophils Relative: 0 %
Eosinophils Absolute: 0 10*3/uL (ref 0.0–0.5)
Eosinophils Relative: 0 %
HCT: 32.4 % — ABNORMAL LOW (ref 36.0–46.0)
Hemoglobin: 10.2 g/dL — ABNORMAL LOW (ref 12.0–15.0)
Immature Granulocytes: 1 %
Lymphocytes Relative: 7 %
Lymphs Abs: 0.4 10*3/uL — ABNORMAL LOW (ref 0.7–4.0)
MCH: 26.8 pg (ref 26.0–34.0)
MCHC: 31.5 g/dL (ref 30.0–36.0)
MCV: 85.3 fL (ref 80.0–100.0)
Monocytes Absolute: 0.6 10*3/uL (ref 0.1–1.0)
Monocytes Relative: 11 %
Neutro Abs: 4.4 10*3/uL (ref 1.7–7.7)
Neutrophils Relative %: 81 %
Platelets: 306 10*3/uL (ref 150–400)
RBC: 3.8 MIL/uL — ABNORMAL LOW (ref 3.87–5.11)
RDW: 20.3 % — ABNORMAL HIGH (ref 11.5–15.5)
WBC: 5.5 10*3/uL (ref 4.0–10.5)
nRBC: 0 % (ref 0.0–0.2)

## 2020-08-03 LAB — MAGNESIUM: Magnesium: 2 mg/dL (ref 1.7–2.4)

## 2020-08-03 LAB — I-STAT BETA HCG BLOOD, ED (MC, WL, AP ONLY): I-stat hCG, quantitative: <5 m[IU]/mL (ref <5)

## 2020-08-03 LAB — ETHANOL: Alcohol, Ethyl (B): 200 mg/dL — ABNORMAL HIGH (ref <10)

## 2020-08-03 LAB — SALICYLATE LEVEL: Salicylate Lvl: <7 mg/dL — ABNORMAL LOW (ref 7.0–30.0)

## 2020-08-03 LAB — ACETAMINOPHEN LEVEL
Acetaminophen (Tylenol), Serum: <10 ug/mL — ABNORMAL LOW (ref 10–30)
Acetaminophen (Tylenol), Serum: <10 ug/mL — ABNORMAL LOW (ref 10–30)

## 2020-08-03 LAB — PHOSPHORUS: Phosphorus: 3.2 mg/dL (ref 2.5–4.6)

## 2020-08-03 MED ORDER — LORAZEPAM 2 MG/ML IJ SOLN
1.0000 mg | INTRAMUSCULAR | Status: DC | PRN
Start: 2020-08-03 — End: 2020-08-04

## 2020-08-03 MED ORDER — LEVALBUTEROL HCL 0.63 MG/3ML IN NEBU
0.6300 mg | INHALATION_SOLUTION | Freq: Four times a day (QID) | RESPIRATORY_TRACT | Status: DC | PRN
  Administered 2020-08-05: 0.63 mg via RESPIRATORY_TRACT
  Filled 2020-08-03: qty 3

## 2020-08-03 MED ORDER — OXYCODONE HCL 5 MG PO TABS
10.0000 mg | ORAL_TABLET | ORAL | Status: DC | PRN
  Administered 2020-08-04 (×3): 10 mg via ORAL
  Filled 2020-08-03 (×3): qty 2

## 2020-08-03 MED ORDER — LORAZEPAM 1 MG PO TABS
1.0000 mg | ORAL_TABLET | ORAL | Status: DC | PRN
Start: 2020-08-03 — End: 2020-08-04
  Administered 2020-08-04 (×2): 2 mg via ORAL
  Filled 2020-08-03 (×2): qty 2

## 2020-08-03 MED ORDER — ADULT MULTIVITAMIN W/MINERALS CH
1.0000 | ORAL_TABLET | Freq: Every day | ORAL | Status: DC
  Administered 2020-08-04: 1 via ORAL
  Filled 2020-08-03: qty 1

## 2020-08-03 MED ORDER — LORAZEPAM 1 MG PO TABS
1.0000 mg | ORAL_TABLET | Freq: Once | ORAL | Status: AC
  Administered 2020-08-03: 1 mg via ORAL
  Filled 2020-08-03: qty 1

## 2020-08-03 MED ORDER — THIAMINE HCL 100 MG PO TABS
100.0000 mg | ORAL_TABLET | Freq: Two times a day (BID) | ORAL | Status: DC
  Administered 2020-08-03 – 2020-08-06 (×6): 100 mg via ORAL
  Filled 2020-08-03 (×6): qty 1

## 2020-08-03 MED ORDER — SODIUM CHLORIDE 0.9 % IV BOLUS
1000.0000 mL | Freq: Once | INTRAVENOUS | Status: AC
  Administered 2020-08-03: 1000 mL via INTRAVENOUS

## 2020-08-03 MED ORDER — ACETAMINOPHEN 650 MG RE SUPP: 650.0000 mg | Freq: Four times a day (QID) | RECTAL | Status: DC | PRN

## 2020-08-03 MED ORDER — POLYETHYLENE GLYCOL 3350 17 G PO PACK: 17.0000 g | PACK | Freq: Every day | ORAL | Status: DC | PRN

## 2020-08-03 MED ORDER — LORAZEPAM 2 MG/ML IJ SOLN
2.0000 mg | Freq: Once | INTRAMUSCULAR | Status: AC
  Administered 2020-08-03: 2 mg via INTRAVENOUS
  Filled 2020-08-03: qty 1

## 2020-08-03 MED ORDER — FOLIC ACID 1 MG PO TABS
1.0000 mg | ORAL_TABLET | Freq: Every day | ORAL | Status: DC
  Administered 2020-08-04 – 2020-08-06 (×3): 1 mg via ORAL
  Filled 2020-08-03 (×3): qty 1

## 2020-08-03 MED ORDER — ENOXAPARIN SODIUM 40 MG/0.4ML ~~LOC~~ SOLN
40.0000 mg | SUBCUTANEOUS | Status: DC
  Administered 2020-08-04 – 2020-08-06 (×3): 40 mg via SUBCUTANEOUS
  Filled 2020-08-03 (×3): qty 0.4

## 2020-08-03 MED ORDER — POTASSIUM CHLORIDE CRYS ER 20 MEQ PO TBCR
40.0000 meq | EXTENDED_RELEASE_TABLET | Freq: Once | ORAL | Status: AC
  Administered 2020-08-03: 40 meq via ORAL
  Filled 2020-08-03: qty 2

## 2020-08-03 MED ORDER — METHOCARBAMOL 1000 MG/10ML IJ SOLN: 1000.0000 mg | Freq: Once | INTRAMUSCULAR | Status: DC

## 2020-08-03 MED ORDER — NALOXONE HCL 0.4 MG/ML IJ SOLN: 0.4000 mg | INTRAMUSCULAR | Status: DC | PRN

## 2020-08-03 MED ORDER — METHOCARBAMOL 1000 MG/10ML IJ SOLN
1000.0000 mg | Freq: Once | INTRAVENOUS | Status: AC
  Administered 2020-08-03: 1000 mg via INTRAVENOUS
  Filled 2020-08-03: qty 1000

## 2020-08-03 MED ORDER — LIDOCAINE 5 % EX PTCH
1.0000 | MEDICATED_PATCH | CUTANEOUS | Status: DC
  Administered 2020-08-05: 1 via TRANSDERMAL
  Filled 2020-08-03 (×3): qty 1

## 2020-08-03 MED ORDER — MOMETASONE FURO-FORMOTEROL FUM 200-5 MCG/ACT IN AERO
2.0000 | INHALATION_SPRAY | Freq: Two times a day (BID) | RESPIRATORY_TRACT | Status: DC
  Administered 2020-08-04 – 2020-08-06 (×5): 2 via RESPIRATORY_TRACT
  Filled 2020-08-03: qty 8.8

## 2020-08-03 MED ORDER — CYCLOBENZAPRINE HCL 5 MG PO TABS
5.0000 mg | ORAL_TABLET | Freq: Three times a day (TID) | ORAL | Status: DC | PRN
  Administered 2020-08-04: 5 mg via ORAL
  Filled 2020-08-03: qty 1

## 2020-08-03 MED ORDER — LORAZEPAM 2 MG/ML IJ SOLN
1.0000 mg | Freq: Once | INTRAMUSCULAR | Status: AC
  Administered 2020-08-03: 1 mg via INTRAVENOUS
  Filled 2020-08-03: qty 1

## 2020-08-03 MED ORDER — PROMETHAZINE HCL 25 MG RE SUPP
25.0000 mg | Freq: Four times a day (QID) | RECTAL | Status: DC | PRN
  Filled 2020-08-03: qty 1

## 2020-08-03 MED ORDER — NICOTINE 14 MG/24HR TD PT24
14.0000 mg | MEDICATED_PATCH | Freq: Every day | TRANSDERMAL | Status: DC
  Administered 2020-08-03 – 2020-08-06 (×4): 14 mg via TRANSDERMAL
  Filled 2020-08-03 (×4): qty 1

## 2020-08-03 MED ORDER — ALBUTEROL SULFATE HFA 108 (90 BASE) MCG/ACT IN AERS
4.0000 | INHALATION_SPRAY | Freq: Once | RESPIRATORY_TRACT | Status: AC
  Administered 2020-08-03: 4 via RESPIRATORY_TRACT
  Filled 2020-08-03: qty 6.7

## 2020-08-03 MED ORDER — PREGABALIN 100 MG PO CAPS
300.0000 mg | ORAL_CAPSULE | Freq: Three times a day (TID) | ORAL | Status: DC
  Administered 2020-08-04 – 2020-08-06 (×8): 300 mg via ORAL
  Filled 2020-08-03 (×4): qty 3
  Filled 2020-08-03: qty 6
  Filled 2020-08-03 (×3): qty 3

## 2020-08-03 MED ORDER — PROMETHAZINE HCL 25 MG PO TABS
12.5000 mg | ORAL_TABLET | Freq: Four times a day (QID) | ORAL | Status: DC | PRN
  Administered 2020-08-04 – 2020-08-05 (×3): 12.5 mg via ORAL
  Filled 2020-08-03 (×3): qty 1

## 2020-08-03 MED ORDER — ACETAMINOPHEN 325 MG PO TABS
650.0000 mg | ORAL_TABLET | Freq: Four times a day (QID) | ORAL | Status: DC | PRN
  Administered 2020-08-04 – 2020-08-06 (×4): 650 mg via ORAL
  Filled 2020-08-03 (×5): qty 2

## 2020-08-03 MED ORDER — AEROCHAMBER Z-STAT PLUS/MEDIUM MISC
Status: AC
  Filled 2020-08-03: qty 1

## 2020-08-03 NOTE — ED Notes (Signed)
Cleared by [employer]. Spoke with Amil Amen.

## 2020-08-03 NOTE — ED Notes (Signed)
Patient is resting.  No distress noted.  Calm and cooperative.  Asking for ice multiple times, co being extremely thirsty.  Ice Chips provided

## 2020-08-03 NOTE — ED Notes (Signed)
Patient ambulated to the bathroom to void and bowel movement.

## 2020-08-03 NOTE — ED Notes (Signed)
Patient to radiology for chest x-ray.

## 2020-08-03 NOTE — ED Notes (Signed)
Pt. Made aware for the need of urine specimen. 

## 2020-08-03 NOTE — ED Notes (Signed)
Awaiting TTS consult.  

## 2020-08-03 NOTE — ED Triage Notes (Signed)
Patient arrives via EMS from her home- nointentional overdose of Percocet.  Patient reports that she takes Percocet for chronic back pain.  EMS reports that on arrival she was unresponsive, 4 mg of Narcan given nasal, bagged x 8 minutes until patient regained respirations and alertness.  Patient is alert and oriented.  Answers all questions appropriately and denies intentional attempting to harm herself.  Patient reports that this has happened once before.

## 2020-08-03 NOTE — ED Notes (Signed)
Repeat tylenol level sent to the lab.  Patient is sitting up in the bed.  No distress noted

## 2020-08-03 NOTE — ED Notes (Signed)
Patient speaking with TTS.  
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
## 2020-08-03 NOTE — ED Provider Notes (Signed)
[facility]-EMERGENCY DEPT Provider Note   CSN: [id number] Arrival date & time: 08/03/20  1331     History Chief Complaint  Patient presents with  . Drug Overdose    Vicki Moore is a 40 y.o. female.  HPI      40 year old female with a history of asthma, depression, anxiety, prior opiate overdose in October 2021 at which time she was initially encoded as a CPR but was ultimately found to be apneic secondary to narcotic and left AGAINST MEDICAL ADVICE, who presents with concern for narcotic overdose which she reports was due to accidental Percocet ingestion for her chronic pain, and for which she was found to be apneic requiring 8 minutes of BVM and 4 mg of intranasal Narcan.   She reports that she took Percocet 2 pills at a time, every few hours to total 6 tablets of Percocet.  She denies any other ingestions.  Reports that she is out of her alprazolam for the last 2 days.  She reports occasional alcohol use, noting that she had [place] whiskey last night.  Denies any history of alcohol withdrawal, alcohol dependence.  Denies any other drug use.  She reports she feels her asthma is acting up some and takes prn prednisone at home.  Reports she has been under signicant stress and anxiety like she has never had before.  Her grandmother died, she has gained weight and is under a lot of stress. She has a 67 year old son and feels she has not been present the last 2 years. She has not spoken to anyone about this.  She denies suicidal ideation or purposeful overdose.  Reports she is having severe chronic pain and is tired of feeling that way and took the medications to help with the pain.    Past Medical History:  Diagnosis Date  . Anxiety   . Asthma   . Asthma   . Depression   . GERD (gastroesophageal reflux disease)     Patient Active Problem List   Diagnosis Date Noted  . Chronic prescription opiate use 08/04/2020  . Benzodiazepine withdrawal (HCC)  08/03/2020  . Chronic pain 08/03/2020  . Hypokalemia 08/03/2020  . Alcohol abuse 08/03/2020  . Tobacco abuse 08/03/2020  . Anxiety disorder   . Nephrolithiasis 01/10/2020  . Biliary colic   . Right upper quadrant pain 11/08/2019  . RUQ pain 11/07/2019  . GERD (gastroesophageal reflux disease) 11/07/2019  . Bilateral foot pain 12/08/2017  . 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 Hypoxemia 08/08/2017  . Unspecified asthma with (acute) exacerbation 08/08/2017  . Influenza A with pneumonia 04/29/2017  . 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 Drug overdose 04/23/2017  . Polysubstance abuse (HCC) 04/23/2017  . Anxiety state 03/22/2009  . Asthma 01/02/2009  . LOW BACK PAIN 01/02/2009    Past Surgical History:  Procedure Laterality Date  . CHOLECYSTECTOMY N/A 12/08/2019   Procedure: LAPAROSCOPIC CHOLECYSTECTOMY;  Surgeon: Franky Macho, MD;  Location: [facility];  Service: General;  Laterality: N/A;     OB History   No obstetric history on file.     History reviewed. No pertinent family history.  Social History   Tobacco Use  . Smoking status: Current Some Day Smoker    Packs/day: 0.25    Years: 10.00    Pack years: 2.50    Types: Cigarettes  . Smokeless tobacco: Never Used  Vaping Use  . Vaping Use: Never used  Substance Use Topics  . Alcohol  use: Yes    Comment: rare  . Drug use: Not Currently    Home Medications Prior to Admission medications   Medication Sig Start Date End Date Taking? Authorizing Provider  ADVAIR DISKUS 500-50 MCG/DOSE AEPB Inhale 1 puff into the lungs 2 (two) times daily. 10/11/19  Yes [provider]  albuterol (PROVENTIL HFA;VENTOLIN HFA) 108 (90 BASE) MCG/ACT inhaler Inhale 2 puffs into the lungs every 6 (six) hours as needed for wheezing or shortness of breath. For shortness of breath   Yes [provider]  alprazolam Prudy Feeler) 2 MG tablet Take 2 mg by mouth in the morning, at noon, and at bedtime.   Yes [provider]  cyclobenzaprine (FLEXERIL) 5 MG tablet Take 1 tablet (5 mg total) by mouth 3 (three) times daily as needed for muscle spasms. 04/16/20  Yes Newlin, Odette Horns, MD  ipratropium-albuterol (DUONEB) 0.5-2.5 (3) MG/3ML SOLN Take 3 mLs by nebulization 4 (four) times daily as needed for wheezing or shortness of breath. 10/11/19  Yes [provider]  lidocaine (LIDODERM) 5 % Place 1 patch onto the skin daily. Remove & Discard patch within 12 hours or as directed by MD   Yes [provider]  loperamide (IMODIUM A-D) 2 MG tablet Take 2 mg by mouth 4 (four) times daily as needed for diarrhea or loose stools.   Yes [provider]  methylphenidate (RITALIN) 10 MG tablet Take 10 mg by mouth 2 (two) times daily as needed (lack of concentration). Takes with 20 mg tablet to total 30 mg   Yes [provider]  methylphenidate (RITALIN) 20 MG tablet Take 20 mg by mouth 2 (two) times daily as needed (lack of concentration). Takes along with 10 mg tablet to total 30mg  tablet   Yes [provider]  oxyCODONE-acetaminophen (PERCOCET) 10-325 MG tablet Take 1 tablet by mouth every 4 (four) hours as needed for pain.   Yes [provider]  pantoprazole (PROTONIX) 40 MG tablet Take 1 tablet (40 mg total) by mouth 2 (two) times daily before a meal. Patient taking differently: Take 40 mg by mouth 2 (two) times daily as needed (heartburn). 11/15/19  Yes Amin, Ankit Chirag, MD  pregabalin (LYRICA) 300 MG capsule Take 300 mg by mouth 3 (three) times daily. 03/15/20  Yes [provider]  promethazine (PHENERGAN) 25 MG suppository Place 1 suppository (25 mg total) rectally every 6 (six) hours as needed for refractory nausea / vomiting. 04/23/20  Yes Long, 06/21/20, MD  promethazine (PHENERGAN) 25 MG tablet Take 1 tablet (25 mg total) by mouth every 6 (six) hours as needed for nausea or vomiting. 04/23/20  Yes Long, 06/21/20, MD  simethicone (MYLICON) 125 MG chewable  tablet Chew 125 mg by mouth every 6 (six) hours as needed for flatulence.   Yes [provider]  tetrahydrozoline-zinc (VISINE-AC) 0.05-0.25 % ophthalmic solution Place 1 drop into both eyes 3 (three) times daily as needed (dry eyes).   Yes [provider]  valACYclovir (VALTREX) 1000 MG tablet Take 1,000 mg by mouth 2 (two) times daily as needed (breakouts). 02/09/20  Yes [provider]  dicyclomine (BENTYL) 20 MG tablet Take 1 tablet (20 mg total) by mouth 3 (three) times daily as needed for spasms (abdominal pain). Patient not taking: No sig reported 04/23/20   Long, 06/21/20, MD  senna-docusate (SENOKOT-S) 8.6-50 MG tablet Take 2 tablets by mouth at bedtime as needed for mild constipation or moderate constipation. Patient not taking: No sig  reported 11/15/19   Dimple NanasAmin, Ankit Chirag, MD  traZODone (DESYREL) 50 MG tablet Take 50 mg by mouth daily as needed for sleep. Patient not taking: No sig reported 03/27/20   [provider]    Allergies    Other, Peanut-containing drug products, and Penicillins  Review of Systems   Review of Systems  Constitutional: Negative for fever.  HENT: Negative for sore throat.   Eyes: Negative for visual disturbance.  Respiratory: Positive for shortness of breath and wheezing. Negative for cough.   Cardiovascular: Negative for chest pain.  Gastrointestinal: Positive for nausea and vomiting (chronic episodes daily but none today). Negative for abdominal pain.  Genitourinary: Negative for difficulty urinating.  Musculoskeletal: Positive for arthralgias (chronic pain) and back pain. Negative for neck pain.  Skin: Negative for rash.  Neurological: Negative for syncope and headaches.    Physical Exam Updated Vital Signs BP 130/82   Pulse (!) 114   Temp 98.2 F (36.8 C) (Oral)   Resp 18   Ht 5\' 1"  (1.549 m)   Wt 96.3 kg   LMP 08/03/2020   SpO2 94%   BMI 40.11 kg/m   Physical Exam Vitals and nursing note reviewed.   Constitutional:      General: She is not in acute distress.    Appearance: She is well-developed. She is not diaphoretic.  HENT:     Head: Normocephalic and atraumatic.  Eyes:     Conjunctiva/sclera: Conjunctivae normal.  Cardiovascular:     Rate and Rhythm: Normal rate and regular rhythm.     Heart sounds: Normal heart sounds. No murmur heard. No friction rub. No gallop.   Pulmonary:     Effort: Pulmonary effort is normal. No respiratory distress.     Breath sounds: Normal breath sounds. No wheezing or rales.  Abdominal:     General: There is no distension.     Palpations: Abdomen is soft.     Tenderness: There is no abdominal tenderness. There is no guarding.  Musculoskeletal:        General: No tenderness.     Cervical back: Normal range of motion.  Skin:    General: Skin is warm and dry.     Findings: No erythema or rash.  Neurological:     Mental Status: She is alert and oriented to person, place, and time.     ED Results / Procedures / Treatments   Labs (all labs ordered are listed, but only abnormal results are displayed) Labs Reviewed  CBC WITH DIFFERENTIAL/PLATELET - Abnormal; Notable for the following components:      Result Value   RBC 3.80 (*)    Hemoglobin 10.2 (*)    HCT 32.4 (*)    RDW 20.3 (*)    Lymphs Abs 0.4 (*)    All other components within normal limits  COMPREHENSIVE METABOLIC PANEL - Abnormal; Notable for the following components:   Potassium 3.1 (*)    CO2 20 (*)    Glucose, Bld 136 (*)    Calcium 8.4 (*)    AST 237 (*)    ALT 125 (*)    Total Bilirubin 0.2 (*)    All other components within normal limits  ACETAMINOPHEN LEVEL - Abnormal; Notable for the following components:   Acetaminophen (Tylenol), Serum <10 (*)    All other components within normal limits  SALICYLATE LEVEL - Abnormal; Notable for the following components:   Salicylate Lvl <7.0 (*)    All other components within normal limits  ETHANOL - Abnormal; Notable for the  following components:   Alcohol, Ethyl (B) 200 (*)    All other components within normal limits  ACETAMINOPHEN LEVEL - Abnormal; Notable for the following components:   Acetaminophen (Tylenol), Serum <10 (*)    All other components within normal limits  RAPID URINE DRUG SCREEN, HOSP PERFORMED - Abnormal; Notable for the following components:   Opiates POSITIVE (*)    All other components within normal limits  CBC - Abnormal; Notable for the following components:   RBC 3.41 (*)    Hemoglobin 9.0 (*)    HCT 28.9 (*)    RDW 20.7 (*)    All other components within normal limits  COMPREHENSIVE METABOLIC PANEL - Abnormal; Notable for the following components:   Potassium 2.8 (*)    Calcium 8.3 (*)    Total Protein 5.7 (*)    Albumin 3.2 (*)    AST 118 (*)    ALT 93 (*)    All other components within normal limits  MRSA PCR SCREENING  RESP PANEL BY RT-PCR (FLU A&B, COVID) ARPGX2  MAGNESIUM  PHOSPHORUS  MAGNESIUM  PHOSPHORUS  I-STAT BETA HCG BLOOD, ED ([facility], [facility], [facility] ONLY)    EKG EKG Interpretation  Date/Time:  Saturday August 03 2020 13:49:45 EDT Ventricular Rate:  114 PR Interval:  130 QRS Duration: 105 QT Interval:  359 QTC Calculation: 495 R Axis:   5 Text Interpretation: Sinus tachycardia Abnormal R-wave progression, early transition Repol abnrm suggests ischemia, lateral leads Since prior ECG, rate has increased Confirmed by Alvira Monday ([id number]) on 08/03/2020 5:41:42 PM   Radiology DG Chest 2 View  Result Date: 08/03/2020 CLINICAL DATA:  Overdose of Percocet. EXAM: CHEST - 2 VIEW COMPARISON:  None. FINDINGS: The heart size and mediastinal contours are within normal limits. Both lungs are clear. The visualized skeletal structures are unremarkable. IMPRESSION: No active cardiopulmonary disease. Electronically Signed   By: Gerome Sam III M.D   On: 08/03/2020 15:29    Procedures Procedures   Medications Ordered in ED Medications  lidocaine (LIDODERM) 5 % 1 patch  (1 patch Transdermal Patient Refused/Not Given 08/03/20 2222)  enoxaparin (LOVENOX) injection 40 mg (40 mg Subcutaneous Given 08/04/20 0905)  promethazine (PHENERGAN) tablet 12.5 mg (12.5 mg Oral Given 08/04/20 0905)  polyethylene glycol (MIRALAX / GLYCOLAX) packet 17 g (has no administration in time range)  acetaminophen (TYLENOL) tablet 650 mg (650 mg Oral Given 08/04/20 0906)    Or  acetaminophen (TYLENOL) suppository 650 mg ( Rectal See Alternative 08/04/20 0906)  levalbuterol (XOPENEX) nebulizer solution 0.63 mg (has no administration in time range)  folic acid (FOLVITE) tablet 1 mg (1 mg Oral Given 08/04/20 0905)  thiamine tablet 100 mg (100 mg Oral Given 08/04/20 0906)  nicotine (NICODERM CQ - dosed in mg/24 hours) patch 14 mg (14 mg Transdermal Patch Applied 08/04/20 0909)  oxyCODONE (Oxy IR/ROXICODONE) immediate release tablet 10 mg (10 mg Oral Given 08/04/20 1025)  naloxone (NARCAN) injection 0.4 mg (has no administration in time range)  pregabalin (LYRICA) capsule 300 mg (300 mg Oral Given 08/04/20 0905)  mometasone-formoterol (DULERA) 200-5 MCG/ACT inhaler 2 puff (2 puffs Inhalation Given 08/04/20 0827)  promethazine (PHENERGAN) suppository 25 mg (has no administration in time range)  cyclobenzaprine (FLEXERIL) tablet 5 mg (5 mg Oral Given 08/04/20 0907)  Chlorhexidine Gluconate Cloth 2 % PADS 6 each (0 each Topical Duplicate 08/04/20 0600)  potassium chloride SA (KLOR-CON) CR tablet 40 mEq (40 mEq Oral Given 08/04/20 6045)  magnesium oxide (MAG-OX) tablet 400 mg (400 mg Oral Given 08/04/20 0906)  hydrOXYzine (ATARAX/VISTARIL) tablet 25 mg (has no administration in time range)  loperamide (IMODIUM) capsule 2-4 mg (has no administration in time range)  ondansetron (ZOFRAN-ODT) disintegrating tablet 4 mg (has no administration in time range)  LORazepam (ATIVAN) tablet 1 mg (has no administration in time range)    Followed by  LORazepam (ATIVAN) tablet 1 mg (has no administration in time range)     Followed by  LORazepam (ATIVAN) tablet 1 mg (has no administration in time range)    Followed by  LORazepam (ATIVAN) tablet 1 mg (has no administration in time range)  sodium chloride 0.9 % bolus 1,000 mL (0 mLs Intravenous Stopped 08/03/20 1654)  LORazepam (ATIVAN) tablet 1 mg (1 mg Oral Given 08/03/20 1542)  LORazepam (ATIVAN) injection 2 mg (2 mg Intravenous Given 08/03/20 2051)  albuterol (VENTOLIN HFA) 108 (90 Base) MCG/ACT inhaler 4 puff (4 puffs Inhalation Given 08/03/20 2215)  aerochamber Z-Stat Plus/medium (  Given 08/03/20 2222)  LORazepam (ATIVAN) injection 1 mg (1 mg Intravenous Given 08/03/20 2216)  sodium chloride 0.9 % bolus 1,000 mL (0 mLs Intravenous Stopped 08/04/20 0123)  methocarbamol (ROBAXIN) 1,000 mg in dextrose 5 % 100 mL IVPB (0 mg Intravenous Stopped 08/03/20 2257)  potassium chloride SA (KLOR-CON) CR tablet 40 mEq (40 mEq Oral Given 08/03/20 2307)    ED Course  I have reviewed the triage vital signs and the nursing notes.  Pertinent labs & imaging results that were available during my care of the patient were reviewed by me and considered in my medical decision making (see chart for details).    MDM Rules/Calculators/A&P                          40 year old female with a history of asthma, depression, anxiety, prior opiate overdose in October 2021 at which time she was initially encoded as a CPR but was ultimately found to be apneic secondary to narcotic and left AGAINST MEDICAL ADVICE, who presents with concern for narcotic overdose which she reports was due to accidental Percocet ingestion for her chronic pain, and for which she was found to be apneic requiring 8 minutes of BVM and 4 mg of intranasal Narcan.  She is awake and alert in the emergency department has not required any additional Narcan.  Chest x-ray showed no sign of aspiration or other abnormalities.  Labs are significant for mildly elevated AST and ALT, which are unchanged since January.  Initial  acetaminophen level is undetectable despite her report of taking Percocet this morning. ETOH 200 at 3PM despite report of infrequent etoh use and drinking last night.   She admits to being under significant stress, and is tearful regarding the degree of anxiety that she has, but denies suicidal ideation or intentional overdose.  I am concerned that the amount of medication that she reports taking does not correlate with the severity of presentation she had and am also concerned that this is the second event with this severity.  In addition is certain she took percocet with acetaminophen and has negative levels and reports she drank last night and etoh is 200 this afternoon.  She denies chronic tylenol ingestion and doubt chronic acetaminophen toxicity.  She reports chronic abdominal pain for the last year, do not suspect acute cholangitis or choledocolithiasis, had similar elevation LFTs previously.   She reports she would like help for stress and I have consulted  TTS and discussed with patient and team that I am concerned about her safety and feel she needs help with her depression and anxiety. Patient agrees and is voluntary.  While in the ED became more tachycardic to the 130s, increasingly tremulous.  Does report not taking xanax for 2 days and possible benzodiazepine withdrawal but also consider etoh withdrawal given elevation AST/ALT, pt reporting last drink last night with level of 200 this afternoon.    Given 2mg  of ativan with improvement however continuing tachycardia, given additional 1mg  ativan.  CIWA 15-16.  Will admit for withdrawal, have inpatient psychiatry evaluation.     Final Clinical Impression(s) / ED Diagnoses Final diagnoses:  Benzodiazepine withdrawal without complication (HCC)  Opiate overdose, undetermined intent, initial encounter (HCC)  Depression, unspecified depression type  Transaminitis    Rx / DC Orders ED Discharge Orders    None       ,  MD 08/04/20 1147

## 2020-08-03 NOTE — BH Assessment (Addendum)
DISPOSITION: Gave clinical report to Melbourne Abts, PA-C who determined Pt does  meet criteria for overnight observation to be reevaluated in the am by provider. Notified Dr. Alvira Monday, MD  and [name], RN of disposition recommendation and the sitter utilization recommendation.

## 2020-08-03 NOTE — H&P (Signed)
History and Physical  Patient Name: Vicki PinesLeanne M Blyth     ZOX:[id number]    DOB: 09/22/1980    DOA: 08/03/2020 PCP: Hoy RegisterNewlin, Enobong, MD  Patient coming from: Home  Chief Complaint: Unintentional narcotic/pain overdose, uncontrollable pain    HPI: Vicki PinesLeanne M Sandra is a 40 y.o. female, with PMH of asthma, chronic pain, anxiety, substance abuse who presented to the ER on 08/03/2020 with unintentional narcotic/pain medication overdose due to intractable pain.  Over the past day or so patient has been experiencing worsening of her chronic abdominal pain.  She complains of pain in her right upper quadrant, shoulder, and diffuse pain at times.  At home she is on Percocet as needed.  However because she was in significant pain, she took 2 Percocet tablets at a time every few hours for total of 6 tablets.  She also states she drank whiskey last night to help with the pain.  She says she is only occasional drinker though.  She also reports she recently ran out of her Xanax a few days ago.  She endorses significant amount of anxiety over the past year or so.  She admits to significant amount of weight gain over this time.  She also smokes cigarettes, despite having history of chronic asthma.  She also had a similar presentation in October 2021 where she was apneic likely due to narcotic overdose and even had CPR performed at that time.  But during that hospitalization she left AMA.  EMS was contacted given her encephalopathy and apnea.  She was given Narcan and bagged for 8 minutes and eventually regained mentation and alertness.  She then was brought to the hospital for evaluation.   ED course: -Vitals on admission:, Heart rate 110, respiratory rate 12, blood pressure 112/75, maintaining sats on room air -Labs on initial presentation: Sodium 144, potassium 3.1, bicarb 20, glucose 136, BUN 14, creatinine 0.8, AST 237, ALT 125, WBC 5.5, hemoglobin 10.2, Tylenol undetectable, alcohol 200 -Imaging obtained on  admission: Chest x-ray unremarkable -In the ED the patient was given Ativan, IV fluids, Robaxin.  She was assessed by behavioral health as well.  The hospitalist service was contacted for further evaluation and management given concern for withdrawal.     ROS: A complete and thorough 12 point review of systems obtained, negative listed in HPI.     Past Medical History:  Diagnosis Date  . Anxiety   . Asthma   . Asthma   . Depression   . GERD (gastroesophageal reflux disease)     Past Surgical History:  Procedure Laterality Date  . CHOLECYSTECTOMY N/A 12/08/2019   Procedure: LAPAROSCOPIC CHOLECYSTECTOMY;  Surgeon: Franky MachoJenkins, Mark, MD;  Location: [facility];  Service: General;  Laterality: N/A;    Social History: Patient lives at home.  The patient walks without assistance.  Current smoker.  Allergies  Allergen Reactions  . Other Shortness Of Breath and Hives  . Peanut-Containing Drug Products Hives and Shortness Of Breath  . Penicillins Anaphylaxis, Hives and Nausea And Vomiting    Other reaction(s): RASH      Family history: family history is not on file.  Prior to Admission medications   Medication Sig Start Date End Date Taking? Authorizing Provider  ADVAIR DISKUS 500-50 MCG/DOSE AEPB Inhale 1 puff into the lungs 2 (two) times daily. Patient not taking: Reported on 06/18/2020 10/11/19   [provider]  albuterol (PROVENTIL HFA;VENTOLIN HFA) 108 (90 BASE) MCG/ACT inhaler Inhale 2 puffs into the lungs every 6 (six) hours  as needed for wheezing or shortness of breath. For shortness of breath Patient not taking: Reported on 06/18/2020    [provider]  alprazolam Prudy Feeler) 2 MG tablet Take 2 mg by mouth at bedtime as needed for sleep or anxiety. Patient not taking: Reported on 06/18/2020    [provider]  cyclobenzaprine (FLEXERIL) 5 MG tablet Take 1 tablet (5 mg total) by mouth 3 (three) times daily as needed for muscle spasms. Patient not taking:  Reported on 06/18/2020 04/16/20   [name], MD  dicyclomine (BENTYL) 20 MG tablet Take 1 tablet (20 mg total) by mouth 3 (three) times daily as needed for spasms (abdominal pain). Patient not taking: Reported on 06/18/2020 04/23/20   Long, Arlyss Repress, MD  famotidine (PEPCID) 20 MG tablet Take 20 mg by mouth 2 (two) times daily.    [provider]  ipratropium-albuterol (DUONEB) 0.5-2.5 (3) MG/3ML SOLN Take 3 mLs by nebulization 4 (four) times daily as needed for wheezing or shortness of breath. Patient not taking: Reported on 06/18/2020 10/11/19   [provider]  lidocaine (LIDODERM) 5 % Place 1 patch onto the skin daily. Remove & Discard patch within 12 hours or as directed by MD Patient not taking: Reported on 06/18/2020    [provider]  loperamide (IMODIUM A-D) 2 MG tablet Take 2 mg by mouth 4 (four) times daily as needed for diarrhea or loose stools.    [provider]  pantoprazole (PROTONIX) 40 MG tablet Take 1 tablet (40 mg total) by mouth 2 (two) times daily before a meal. 11/15/19   Amin, Loura Halt, MD  pregabalin (LYRICA) 300 MG capsule Take 300 mg by mouth 3 (three) times daily. Patient not taking: Reported on 06/18/2020 03/15/20   [provider]  promethazine (PHENERGAN) 25 MG suppository Place 1 suppository (25 mg total) rectally every 6 (six) hours as needed for refractory nausea / vomiting. Patient not taking: Reported on 06/18/2020 04/23/20   Long, Arlyss Repress, MD  promethazine (PHENERGAN) 25 MG tablet Take 1 tablet (25 mg total) by mouth every 6 (six) hours as needed for nausea or vomiting. Patient not taking: Reported on 06/18/2020 04/23/20   Long, Arlyss Repress, MD  senna-docusate (SENOKOT-S) 8.6-50 MG tablet Take 2 tablets by mouth at bedtime as needed for mild constipation or moderate constipation. Patient not taking: Reported on 06/18/2020 11/15/19   Dimple Nanas, MD  simethicone (MYLICON) 125 MG chewable tablet Chew 125 mg by mouth every 6 (six)  hours as needed for flatulence.    [provider]  traZODone (DESYREL) 50 MG tablet Take 50 mg by mouth daily as needed for sleep. Patient not taking: Reported on 06/18/2020 03/27/20   [provider]  valACYclovir (VALTREX) 1000 MG tablet Take 1,000 mg by mouth 2 (two) times daily as needed (breakouts). Patient not taking: Reported on 06/18/2020 02/09/20   [provider]       Physical Exam: BP (!) 140/91   Pulse (!) 116   Temp 97.7 F (36.5 C) (Oral)   Resp 20   Ht 5\' 1"  (1.549 m)   Wt 90.7 kg   LMP 08/03/2020   SpO2 97%   BMI 37.79 kg/m   General appearance: Well-developed, adult female, alert, slight distress secondary to pain and anxiety Eyes: Anicteric, conjunctiva pink, lids and lashes normal. PERRL.    ENT: No nasal deformity, discharge, epistaxis.  Hearing intact. OP moist without lesions.   Neck: No neck masses.  Trachea midline.  No thyromegaly/tenderness. Lymph: No cervical or supraclavicular lymphadenopathy. Skin: Warm and dry.  No jaundice.  No suspicious rashes or lesions. Cardiac:  Tachycardic, nl S1-S2, no murmurs appreciated.  No LE edema.  Radial and pedal pulses 2+ and symmetric. Respiratory: Normal respiratory rate and rhythm.  CTAB without rales or wheezes. Abdomen: Abdomen soft.  No tenderness with palpation. No ascites, distension, hepatosplenomegaly.   MSK: No deformities or effusions of the large joints of the upper or lower extremities bilaterally.  No cyanosis or clubbing. Neuro: Cranial nerves 2 through 12 grossly intact.  Sensation intact to light touch. Speech is fluent.  Marland Kitchen    Psych:  Anxious appearing, no suicide ideation    Labs on Admission:  I have personally reviewed following labs and imaging studies: CBC: Recent Labs  Lab 08/03/20 1405  WBC 5.5  NEUTROABS 4.4  HGB 10.2*  HCT 32.4*  MCV 85.3  PLT 306   Basic Metabolic Panel: Recent Labs  Lab 08/03/20 1405  NA 144  K 3.1*  CL 109  CO2 20*  GLUCOSE  136*  BUN 14  CREATININE 0.80  CALCIUM 8.4*   GFR: Estimated Creatinine Clearance: 97.8 mL/min (by C-G formula based on SCr of 0.8 mg/dL).  Liver Function Tests: Recent Labs  Lab 08/03/20 1405  AST 237*  ALT 125*  ALKPHOS 106  BILITOT 0.2*  PROT 7.0  ALBUMIN 3.7   No results for input(s): LIPASE, AMYLASE in the last 168 hours. No results for input(s): AMMONIA in the last 168 hours. Coagulation Profile: No results for input(s): INR, PROTIME in the last 168 hours. Cardiac Enzymes: No results for input(s): CKTOTAL, CKMB, CKMBINDEX, TROPONINI in the last 168 hours. BNP (last 3 results) No results for input(s): PROBNP in the last 8760 hours. HbA1C: No results for input(s): HGBA1C in the last 72 hours. CBG: No results for input(s): GLUCAP in the last 168 hours. Lipid Profile: No results for input(s): CHOL, HDL, LDLCALC, TRIG, CHOLHDL, LDLDIRECT in the last 72 hours. Thyroid Function Tests: No results for input(s): TSH, T4TOTAL, FREET4, T3FREE, THYROIDAB in the last 72 hours. Anemia Panel: No results for input(s): VITAMINB12, FOLATE, FERRITIN, TIBC, IRON, RETICCTPCT in the last 72 hours.   No results found for this or any previous visit (from the past 240 hour(s)).         Radiological Exams on Admission: Personally reviewed imaging which shows: Chest x-ray shows no acute processes DG Chest 2 View  Result Date: 08/03/2020 CLINICAL DATA:  Overdose of Percocet. EXAM: CHEST - 2 VIEW COMPARISON:  None. FINDINGS: The heart size and mediastinal contours are within normal limits. Both lungs are clear. The visualized skeletal structures are unremarkable. IMPRESSION: No active cardiopulmonary disease. Electronically Signed   By: Gerome Sam III M.D   On: 08/03/2020 15:29         Assessment/Plan   1.  Unintentional narcotic/pain medication overdose -Patient presents with encephalopathy, apnea after taking significant amount of Percocet and responded to Narcan -  Currently no issues with apnea but complaining of pain - Acetaminophen level undetectable x2 - We will start on as needed oxycodone - Narcan as needed for signs of overdose - Behavioral health consulted for signs of substance abuse - Close monitoring in the stepdown unit  2.  Concern for benzo and/or alcohol withdrawal - Apparently patient has been out of her Xanax for the past 2 days. -Additionally patient had alcohol level of 200 on admission but says only occasional drinker - Currently severely anxious,  tremulous, tachycardic on exam - Started on alcohol withdrawal protocol with as needed Ativan - Started on thiamine, folate, multivitamin - Behavioral health consulted for signs of substance abuse - Close monitoring in stepdown unit  3.  Generalized anxiety - Patient appears to have significant anxiety that suspect is worsening her medical issues - Started on alcohol withdrawal protocol as above - Behavioral health consulted - No suicidal ideation at this time  4.  Chronic pain -Patient on Percocet at home for multiple chronic pain issues - See further plans above  5.  Tobacco abuse - Nicotine patch prescribed - Recommend cessation  6.  Hypokalemia -On admission potassium 3.1 - Supplementation ordered - Added on magnesium level - Follow-up labs ordered  7.  History of asthma -No signs of acute exacerbation at this time -As needed breathing treatments  8.  Elevated liver enzymes -Suspect related to alcohol abuse - No acute intervention warranted at this time - Follow-up labs ordered      DVT prophylaxis: Lovenox Code Status: Full Family Communication: None Disposition Plan: Anticipate discharge home or psych facility when medically optimized Consults called: Behavioral health contacted by ER Admission status: Stepdown for close monitoring     Medical decision making: Patient seen at 10:03 PM on 08/03/2020.  The patient was discussed with ER provider.  What  exists of the patient's chart was reviewed in depth and summarized above.  Clinical condition: Fair.        Laqueta Due [facility] Please page though AMION or Epic secure chat:  For password, contact charge nurse

## 2020-08-03 NOTE — ED Notes (Signed)
Dr speaking with patient in the room at bedside.

## 2020-08-04 DIAGNOSIS — F13230 Sedative, hypnotic or anxiolytic dependence with withdrawal, uncomplicated: Secondary | ICD-10-CM | POA: Diagnosis not present

## 2020-08-04 DIAGNOSIS — J45909 Unspecified asthma, uncomplicated: Secondary | ICD-10-CM

## 2020-08-04 DIAGNOSIS — F1010 Alcohol abuse, uncomplicated: Secondary | ICD-10-CM | POA: Diagnosis not present

## 2020-08-04 DIAGNOSIS — F411 Generalized anxiety disorder: Secondary | ICD-10-CM | POA: Diagnosis not present

## 2020-08-04 DIAGNOSIS — Z79891 Long term (current) use of opiate analgesic: Secondary | ICD-10-CM

## 2020-08-04 LAB — CBC
HCT: 28.9 % — ABNORMAL LOW (ref 36.0–46.0)
Hemoglobin: 9 g/dL — ABNORMAL LOW (ref 12.0–15.0)
MCH: 26.4 pg (ref 26.0–34.0)
MCHC: 31.1 g/dL (ref 30.0–36.0)
MCV: 84.8 fL (ref 80.0–100.0)
Platelets: 217 10*3/uL (ref 150–400)
RBC: 3.41 MIL/uL — ABNORMAL LOW (ref 3.87–5.11)
RDW: 20.7 % — ABNORMAL HIGH (ref 11.5–15.5)
WBC: 6.2 10*3/uL (ref 4.0–10.5)
nRBC: 0 % (ref 0.0–0.2)

## 2020-08-04 LAB — COMPREHENSIVE METABOLIC PANEL
ALT: 93 U/L — ABNORMAL HIGH (ref 0–44)
AST: 118 U/L — ABNORMAL HIGH (ref 15–41)
Albumin: 3.2 g/dL — ABNORMAL LOW (ref 3.5–5.0)
Alkaline Phosphatase: 83 U/L (ref 38–126)
Anion gap: 11 (ref 5–15)
BUN: 16 mg/dL (ref 6–20)
CO2: 23 mmol/L (ref 22–32)
Calcium: 8.3 mg/dL — ABNORMAL LOW (ref 8.9–10.3)
Chloride: 106 mmol/L (ref 98–111)
Creatinine, Ser: 0.64 mg/dL (ref 0.44–1.00)
GFR, Estimated: >60 mL/min (ref >60)
Glucose, Bld: 91 mg/dL (ref 70–99)
Potassium: 2.8 mmol/L — ABNORMAL LOW (ref 3.5–5.1)
Sodium: 140 mmol/L (ref 135–145)
Total Bilirubin: 0.6 mg/dL (ref 0.3–1.2)
Total Protein: 5.7 g/dL — ABNORMAL LOW (ref 6.5–8.1)

## 2020-08-04 LAB — RESP PANEL BY RT-PCR (FLU A&B, COVID) ARPGX2
Influenza A by PCR: NEGATIVE
Influenza B by PCR: NEGATIVE
SARS Coronavirus 2 by RT PCR: NEGATIVE

## 2020-08-04 LAB — MRSA PCR SCREENING: MRSA by PCR: NEGATIVE

## 2020-08-04 LAB — MAGNESIUM: Magnesium: 1.8 mg/dL (ref 1.7–2.4)

## 2020-08-04 LAB — PHOSPHORUS: Phosphorus: 3.1 mg/dL (ref 2.5–4.6)

## 2020-08-04 MED ORDER — PANTOPRAZOLE SODIUM 40 MG PO TBEC
40.0000 mg | DELAYED_RELEASE_TABLET | Freq: Every day | ORAL | Status: DC
  Administered 2020-08-04 – 2020-08-06 (×3): 40 mg via ORAL
  Filled 2020-08-04 (×3): qty 1

## 2020-08-04 MED ORDER — KETOROLAC TROMETHAMINE 30 MG/ML IJ SOLN
30.0000 mg | Freq: Four times a day (QID) | INTRAMUSCULAR | Status: DC | PRN
  Administered 2020-08-04 – 2020-08-06 (×6): 30 mg via INTRAVENOUS
  Filled 2020-08-04 (×6): qty 1

## 2020-08-04 MED ORDER — LORAZEPAM 1 MG PO TABS: 1.0000 mg | ORAL_TABLET | Freq: Every day | ORAL | Status: DC

## 2020-08-04 MED ORDER — MAGNESIUM OXIDE 400 (241.3 MG) MG PO TABS
400.0000 mg | ORAL_TABLET | Freq: Two times a day (BID) | ORAL | Status: DC
  Administered 2020-08-04 – 2020-08-06 (×5): 400 mg via ORAL
  Filled 2020-08-04 (×5): qty 1

## 2020-08-04 MED ORDER — LORAZEPAM 1 MG PO TABS
1.0000 mg | ORAL_TABLET | Freq: Three times a day (TID) | ORAL | Status: AC
  Administered 2020-08-05 – 2020-08-06 (×3): 1 mg via ORAL
  Filled 2020-08-04 (×3): qty 1

## 2020-08-04 MED ORDER — LOPERAMIDE HCL 2 MG PO CAPS: 2.0000 mg | ORAL_CAPSULE | ORAL | Status: DC | PRN

## 2020-08-04 MED ORDER — DICYCLOMINE HCL 10 MG PO CAPS
10.0000 mg | ORAL_CAPSULE | Freq: Three times a day (TID) | ORAL | Status: DC
  Administered 2020-08-04 – 2020-08-06 (×5): 10 mg via ORAL
  Filled 2020-08-04 (×8): qty 1

## 2020-08-04 MED ORDER — COVID-19 MRNA VAC-TRIS(PFIZER) 30 MCG/0.3ML IM SUSP
0.3000 mL | Freq: Once | INTRAMUSCULAR | Status: AC
  Administered 2020-08-05: 0.3 mL via INTRAMUSCULAR
  Filled 2020-08-04: qty 0.3

## 2020-08-04 MED ORDER — CYCLOBENZAPRINE HCL 10 MG PO TABS
10.0000 mg | ORAL_TABLET | Freq: Three times a day (TID) | ORAL | Status: DC | PRN
  Administered 2020-08-05 – 2020-08-06 (×4): 10 mg via ORAL
  Filled 2020-08-04 (×4): qty 1

## 2020-08-04 MED ORDER — LORAZEPAM 1 MG PO TABS
1.0000 mg | ORAL_TABLET | Freq: Three times a day (TID) | ORAL | Status: AC
  Administered 2020-08-04 (×2): 1 mg via ORAL
  Filled 2020-08-04 (×2): qty 1

## 2020-08-04 MED ORDER — CHLORHEXIDINE GLUCONATE CLOTH 2 % EX PADS
6.0000 | MEDICATED_PAD | Freq: Every day | CUTANEOUS | Status: DC
  Administered 2020-08-04 – 2020-08-06 (×3): 6 via TOPICAL

## 2020-08-04 MED ORDER — LORAZEPAM 1 MG PO TABS: 1.0000 mg | ORAL_TABLET | Freq: Three times a day (TID) | ORAL | Status: DC

## 2020-08-04 MED ORDER — LORAZEPAM 1 MG PO TABS: 1.0000 mg | ORAL_TABLET | Freq: Two times a day (BID) | ORAL | Status: DC

## 2020-08-04 MED ORDER — LORAZEPAM 1 MG PO TABS: 1.0000 mg | ORAL_TABLET | Freq: Four times a day (QID) | ORAL | Status: DC

## 2020-08-04 MED ORDER — POTASSIUM CHLORIDE CRYS ER 20 MEQ PO TBCR
40.0000 meq | EXTENDED_RELEASE_TABLET | Freq: Two times a day (BID) | ORAL | Status: AC
  Administered 2020-08-04 (×2): 40 meq via ORAL
  Filled 2020-08-04 (×2): qty 2

## 2020-08-04 MED ORDER — HYDROXYZINE HCL 25 MG PO TABS
25.0000 mg | ORAL_TABLET | Freq: Four times a day (QID) | ORAL | Status: DC | PRN
  Administered 2020-08-04 – 2020-08-06 (×3): 25 mg via ORAL
  Filled 2020-08-04 (×3): qty 1

## 2020-08-04 MED ORDER — BUPRENORPHINE HCL-NALOXONE HCL 8-2 MG SL SUBL
1.0000 | SUBLINGUAL_TABLET | Freq: Three times a day (TID) | SUBLINGUAL | Status: DC
Start: 2020-08-04 — End: 2020-08-06
  Administered 2020-08-04 – 2020-08-06 (×6): 1 via SUBLINGUAL
  Filled 2020-08-04 (×6): qty 1

## 2020-08-04 MED ORDER — ONDANSETRON 4 MG PO TBDP
4.0000 mg | ORAL_TABLET | Freq: Four times a day (QID) | ORAL | Status: DC | PRN
  Administered 2020-08-05 (×2): 4 mg via ORAL
  Filled 2020-08-04 (×2): qty 1

## 2020-08-04 NOTE — Plan of Care (Signed)
  Problem: Education: Goal: Knowledge of General Education information will improve Description: Including pain rating scale, medication(s)/side effects and non-pharmacologic comfort measures Outcome: Progressing   Problem: Health Behavior/Discharge Planning: Goal: Ability to manage health-related needs will improve Outcome: Progressing   Problem: Clinical Measurements: Goal: Ability to maintain clinical measurements within normal limits will improve Outcome: Progressing Goal: Will remain free from infection Outcome: Progressing Goal: Diagnostic test results will improve Outcome: Progressing Goal: Respiratory complications will improve Outcome: Progressing Goal: Cardiovascular complication will be avoided Outcome: Progressing   Problem: Activity: Goal: Risk for activity intolerance will decrease Outcome: Progressing   Problem: Nutrition: Goal: Adequate nutrition will be maintained Outcome: Progressing   Problem: Coping: Goal: Level of anxiety will decrease Outcome: Progressing   Problem: Elimination: Goal: Will not experience complications related to bowel motility Outcome: Progressing Goal: Will not experience complications related to urinary retention Outcome: Progressing   Problem: Pain Managment: Goal: General experience of comfort will improve Outcome: Progressing   Problem: Safety: Goal: Ability to remain free from injury will improve Outcome: Progressing   Problem: Skin Integrity: Goal: Risk for impaired skin integrity will decrease Outcome: Progressing   Problem: Education: Goal: Ability to state activities that reduce stress will improve Outcome: Progressing   Problem: Coping: Goal: Ability to identify and develop effective coping behavior will improve Outcome: Progressing   Problem: Self-Concept: Goal: Ability to identify factors that promote anxiety will improve Outcome: Progressing Goal: Level of anxiety will decrease Outcome:  Progressing Goal: Ability to modify response to factors that promote anxiety will improve Outcome: Progressing   

## 2020-08-04 NOTE — Consult Note (Addendum)
[facility] Face-to-Face Psychiatry Consult   Reason for Consult:  Overdose, unintentional Referring Physician:  Dr Dalene Seltzer Patient Identification: [name] MRN:  [id number] Principal Diagnosis: Opiate overdose Diagnosis:  Active Problems:   Anxiety state   Chronic prescription opiate use   Asthma   Polysubstance abuse (HCC)   Xanax use disorder, severe (HCC)   Benzodiazepine withdrawal (HCC)   Chronic pain   Hypokalemia   Alcohol abuse   Tobacco abuse   Total Time spent with patient: 1 hour  Subjective:   Vicki Moore is a 40 y.o. female patient admitted with unintentional overdose of opiates.  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  Today, she is sitting upright in bed with no suicidal/homicidal ideations, denies this was a suicide attempt.  Denies past suicide attempts or psychiatric hospitalizations.  Three prior incidences of over taking opiates in the chart (10/21, 1/19), all are noted as unintentional.  She reports she was given Percocets for her surgery at the end of August, per the PDMP she was given 20 of Percocet 10/325 mg tablets, filled on 9/16.  Another prescription of Xanax 2 mg TID was given on 9/27 for 90 tablets despite past history of benzodiazepine use disorder.  She reports she is still taking "left overs".  When asked about the Suboxone she was prescribed, she stated the MD gave this with the Percocet to "prevent addiction".  Educated her on the use of MAT.  She states she rarely drinks but drank the night she accidentally overdose, explained the additive effects of alcohol/benzodiazepines/opiates triggering this overdose.  Continues to voice pain, gallbladder in August.  She states they told her liver "is inflamed", these drugs are augmenting her issues.  She states she vomits and is nauseous all day.  Denies any suicidal/homicidal  ideations, hallucinations.  She is not interested in substance abuse rehab which is recommended.  Allowed this provider to speak with her fiance, Toniann Fail.  He states he has no safety concerns, "I don't think she is trying to hurt herself, just took one too many as she is in a lot of pain."  Recommend a referral to a pain clinic.  Past Psychiatric History: benzodiazepine/opiate use disorders, depression, anxiety  Risk to Self:  none Risk to Others:  none Prior Inpatient Therapy:  none Prior Outpatient Therapy:   none  Past Medical History:  Past Medical History:  Diagnosis Date  . Anxiety   . Asthma   . Asthma   . Depression   . GERD (gastroesophageal reflux disease)     Past Surgical History:  Procedure Laterality Date  . CHOLECYSTECTOMY N/A 12/08/2019   Procedure: LAPAROSCOPIC CHOLECYSTECTOMY;  Surgeon: Franky Macho, MD;  Location: [facility];  Service: General;  Laterality: N/A;   Family History: History reviewed. No pertinent family history. Family Psychiatric  History: none Social History:  Social History   Substance and Sexual Activity  Alcohol Use Yes   Comment: rare     Social History   Substance and Sexual Activity  Drug Use Not Currently    Social History   Socioeconomic History  . Marital status: Single    Spouse name: Not on file  . Number of children: 1  . Years of education: Not on file  . Highest education level: Not on file  Occupational History  . Not on file  Tobacco Use  . Smoking status: Current  Some Day Smoker    Packs/day: 0.25    Years: 10.00    Pack years: 2.50    Types: Cigarettes  . Smokeless tobacco: Never Used  Vaping Use  . Vaping Use: Never used  Substance and Sexual Activity  . Alcohol use: Yes    Comment: rare  . Drug use: Not Currently  . Sexual activity: Not on file  Other Topics Concern  . Not on file  Social History Narrative  . Not on file   Social Determinants of Health   Financial Resource Strain: Not on file   Food Insecurity: Not on file  Transportation Needs: Not on file  Physical Activity: Not on file  Stress: Not on file  Social Connections: Not on file   Additional Social History:    Allergies:   Allergies  Allergen Reactions  . Other Shortness Of Breath and Hives  . Peanut-Containing Drug Products Hives and Shortness Of Breath  . Penicillins Anaphylaxis, Hives and Nausea And Vomiting    Other reaction(s): RASH      Labs:  Results for orders placed or performed during the hospital encounter of 08/03/20 (from the past 48 hour(s))  CBC with Differential     Status: Abnormal   Collection Time: 08/03/20  2:05 PM  Result Value Ref Range   WBC 5.5 4.0 - 10.5 K/uL   RBC 3.80 (L) 3.87 - 5.11 MIL/uL   Hemoglobin 10.2 (L) 12.0 - 15.0 g/dL   HCT 16.1 (L) 09.6 - 04.5 %   MCV 85.3 80.0 - 100.0 fL   MCH 26.8 26.0 - 34.0 pg   MCHC 31.5 30.0 - 36.0 g/dL   RDW 40.9 (H) 81.1 - 91.4 %   Platelets 306 150 - 400 K/uL   nRBC 0.0 0.0 - 0.2 %   Neutrophils Relative % 81 %   Neutro Abs 4.4 1.7 - 7.7 K/uL   Lymphocytes Relative 7 %   Lymphs Abs 0.4 (L) 0.7 - 4.0 K/uL   Monocytes Relative 11 %   Monocytes Absolute 0.6 0.1 - 1.0 K/uL   Eosinophils Relative 0 %   Eosinophils Absolute 0.0 0.0 - 0.5 K/uL   Basophils Relative 0 %   Basophils Absolute 0.0 0.0 - 0.1 K/uL   Immature Granulocytes 1 %   Abs Immature Granulocytes 0.04 0.00 - 0.07 K/uL    Comment: Performed at [facility], [address] [place], [place] [address]  Comprehensive metabolic panel     Status: Abnormal   Collection Time: 08/03/20  2:05 PM  Result Value Ref Range   Sodium 144 135 - 145 mmol/L   Potassium 3.1 (L) 3.5 - 5.1 mmol/L   Chloride 109 98 - 111 mmol/L   CO2 20 (L) 22 - 32 mmol/L   Glucose, Bld 136 (H) 70 - 99 mg/dL    Comment: Glucose reference range applies only to samples taken after fasting for at least 8 hours.   BUN 14 6 - 20 mg/dL   Creatinine, Ser 6.21 0.44 - 1.00 mg/dL   Calcium  8.4 (L) 8.9 - 10.3 mg/dL   Total Protein 7.0 6.5 - 8.1 g/dL   Albumin 3.7 3.5 - 5.0 g/dL   AST 308 (H) 15 - 41 U/L   ALT 125 (H) 0 - 44 U/L   Alkaline Phosphatase 106 38 - 126 U/L   Total Bilirubin 0.2 (L) 0.3 - 1.2 mg/dL   GFR, Estimated >65 >78 mL/min    Comment: (NOTE) Calculated using the  CKD-EPI Creatinine Equation (2021)    Anion gap 15 5 - 15    Comment: Performed at [facility], [address] [place], [place] [address]  Acetaminophen level     Status: Abnormal   Collection Time: 08/03/20  2:05 PM  Result Value Ref Range   Acetaminophen (Tylenol), Serum <10 (L) 10 - 30 ug/mL    Comment: (NOTE) Therapeutic concentrations vary significantly. A range of 10-30 ug/mL  may be an effective concentration for many patients. However, some  are best treated at concentrations outside of this range. Acetaminophen concentrations >150 ug/mL at 4 hours after ingestion  and >50 ug/mL at 12 hours after ingestion are often associated with  toxic reactions.  Performed at [facility], [address], [place], [place] [address]   Salicylate level     Status: Abnormal   Collection Time: 08/03/20  2:05 PM  Result Value Ref Range   Salicylate Lvl <7.0 (L) 7.0 - 30.0 mg/dL    Comment: Performed at [facility], [address], [place], [place] [address]  Ethanol     Status: Abnormal   Collection Time: 08/03/20  2:05 PM  Result Value Ref Range   Alcohol, Ethyl (B) 200 (H) <10 mg/dL    Comment: (NOTE) Lowest detectable limit for serum alcohol is 10 mg/dL.  For medical purposes only. Performed at [facility], [address], [place], [place] [address]   I-Stat Beta hCG blood, ED ([facility], [facility], [facility] only)     Status: None   Collection Time: 08/03/20  2:49 PM  Result Value Ref Range   I-stat hCG, quantitative <5.0 <5 mIU/mL   Comment 3            Comment:   GEST. AGE      CONC.  (mIU/mL)   <=1 WEEK        5 - 50     2  WEEKS       50 - 500     3 WEEKS       100 - 10,000     4 WEEKS     1,000 - 30,000        FEMALE AND NON-PREGNANT FEMALE:     LESS THAN 5 mIU/mL   Acetaminophen level     Status: Abnormal   Collection Time: 08/03/20  6:15 PM  Result Value Ref Range   Acetaminophen (Tylenol), Serum <10 (L) 10 - 30 ug/mL    Comment: (NOTE) Therapeutic concentrations vary significantly. A range of 10-30 ug/mL  may be an effective concentration for many patients. However, some  are best treated at concentrations outside of this range. Acetaminophen concentrations >150 ug/mL at 4 hours after ingestion  and >50 ug/mL at 12 hours after ingestion are often associated with  toxic reactions.  Performed at [facility], [address] [place], [place] [address]   Rapid urine drug screen (hospital performed)     Status: Abnormal   Collection Time: 08/03/20  6:57 PM  Result Value Ref Range   Opiates POSITIVE (A) NONE DETECTED   Cocaine NONE DETECTED NONE DETECTED   Benzodiazepines NONE DETECTED NONE DETECTED   Amphetamines NONE DETECTED NONE DETECTED   Tetrahydrocannabinol NONE DETECTED NONE DETECTED   Barbiturates NONE DETECTED NONE DETECTED    Comment: (NOTE) DRUG SCREEN FOR MEDICAL PURPOSES ONLY.  IF CONFIRMATION IS NEEDED FOR ANY PURPOSE, NOTIFY LAB WITHIN 5 DAYS.  LOWEST DETECTABLE LIMITS FOR URINE DRUG  SCREEN Drug Class                     Cutoff (ng/mL) Amphetamine and metabolites    1000 Barbiturate and metabolites    200 Benzodiazepine                 200 Tricyclics and metabolites     300 Opiates and metabolites        300 Cocaine and metabolites        300 THC                            50 Performed at [facility], [address] [place], [place] [address]   Magnesium     Status: None   Collection Time: 08/03/20 10:11 PM  Result Value Ref Range   Magnesium 2.0 1.7 - 2.4 mg/dL    Comment: Performed at [facility], [address] [place], [place] [address]  Phosphorus     Status: None   Collection Time: 08/03/20 10:11 PM  Result Value Ref Range   Phosphorus 3.2 2.5 - 4.6 mg/dL    Comment: Performed at [facility], [address] [place], [place] [address]  MRSA PCR Screening     Status: None   Collection Time: 08/04/20  1:49 AM   Specimen: Nasal Mucosa; Nasopharyngeal  Result Value Ref Range   MRSA by PCR NEGATIVE NEGATIVE    Comment:        The GeneXpert MRSA Assay (FDA approved for NASAL specimens only), is one component of a comprehensive MRSA colonization surveillance program. It is not intended to diagnose MRSA infection nor to guide or monitor treatment for MRSA infections. Performed at [facility], [address] [place], [place] [address]   CBC     Status: Abnormal   Collection Time: 08/04/20  3:17 AM  Result Value Ref Range   WBC 6.2 4.0 - 10.5 K/uL   RBC 3.41 (L) 3.87 - 5.11 MIL/uL   Hemoglobin 9.0 (L) 12.0 - 15.0 g/dL   HCT 86.528.9 (L) 78.436.0 - 69.646.0 %   MCV 84.8 80.0 - 100.0 fL   MCH 26.4 26.0 - 34.0 pg   MCHC 31.1 30.0 - 36.0 g/dL   RDW 29.520.7 (H) 28.411.5 - 13.215.5 %   Platelets 217 150 - 400 K/uL   nRBC 0.0 0.0 - 0.2 %    Comment: Performed at [facility], [address] [place], [place] [address]  Comprehensive metabolic panel     Status: Abnormal   Collection Time: 08/04/20  3:17 AM  Result Value Ref Range   Sodium 140 135 - 145 mmol/L   Potassium 2.8 (L) 3.5 - 5.1 mmol/L   Chloride 106 98 - 111 mmol/L   CO2 23 22 - 32 mmol/L   Glucose, Bld 91 70 - 99 mg/dL    Comment: Glucose reference range applies only to samples taken after fasting for at least 8 hours.   BUN 16 6 - 20 mg/dL   Creatinine, Ser 2.720.64 0.44 - 1.00 mg/dL   Calcium 8.3 (L) 8.9 - 10.3 mg/dL   Total Protein 5.7 (L) 6.5 - 8.1 g/dL   Albumin 3.2 (L) 3.5 - 5.0 g/dL   AST 536118 (H) 15 - 41 U/L   ALT 93 (H) 0 - 44 U/L   Alkaline Phosphatase 83 38 - 126 U/L  Total Bilirubin 0.6 0.3 - 1.2 mg/dL   GFR, Estimated >55 >97 mL/min    Comment: (NOTE) Calculated using the CKD-EPI Creatinine Equation (2021)    Anion gap 11 5 - 15    Comment: Performed at [facility], [address] [place], [place] [address]  Magnesium     Status: None   Collection Time: 08/04/20  3:17 AM  Result Value Ref Range   Magnesium 1.8 1.7 - 2.4 mg/dL    Comment: Performed at [facility], [address], [place], [place] [address]  Phosphorus     Status: None   Collection Time: 08/04/20  3:17 AM  Result Value Ref Range   Phosphorus 3.1 2.5 - 4.6 mg/dL    Comment: Performed at [facility], [address] [place], [place] [address]  Resp Panel by RT-PCR (Flu A&B, Covid)     Status: None   Collection Time: 08/04/20  3:53 AM  Result Value Ref Range   SARS Coronavirus 2 by RT PCR NEGATIVE NEGATIVE    Comment: (NOTE) SARS-CoV-2 target nucleic acids are NOT DETECTED.  The SARS-CoV-2 RNA is generally detectable in upper respiratory specimens during the acute phase of infection. The lowest concentration of SARS-CoV-2 viral copies this assay can detect is 138 copies/mL. A negative result does not preclude SARS-Cov-2 infection and should not be used as the sole basis for treatment or other patient management decisions. A negative result may occur with  improper specimen collection/handling, submission of specimen other than nasopharyngeal swab, presence of viral mutation(s) within the areas targeted by this assay, and inadequate number of viral copies(<138 copies/mL). A negative result must be combined with clinical observations, patient history, and epidemiological information. The expected result is Negative.  Fact Sheet for Patients:  BloggerCourse.com  Fact Sheet for Healthcare Providers:  SeriousBroker.it  This test is no t yet approved or cleared by the  [place] FDA and  has been authorized for detection and/or diagnosis of SARS-CoV-2 by FDA under an Emergency Use Authorization (EUA). This EUA will remain  in effect (meaning this test can be used) for the duration of the COVID-19 declaration under Section 564(b)(1) of the Act, 21 U.S.C.section 360bbb-3(b)(1), unless the authorization is terminated  or revoked sooner.       Influenza A by PCR NEGATIVE NEGATIVE   Influenza B by PCR NEGATIVE NEGATIVE    Comment: (NOTE) The Xpert Xpress SARS-CoV-2/FLU/RSV plus assay is intended as an aid in the diagnosis of influenza from Nasopharyngeal swab specimens and should not be used as a sole basis for treatment. Nasal washings and aspirates are unacceptable for Xpert Xpress SARS-CoV-2/FLU/RSV testing.  Fact Sheet for Patients: BloggerCourse.com  Fact Sheet for Healthcare Providers: SeriousBroker.it  This test is not yet approved or cleared by the [place] FDA and has been authorized for detection and/or diagnosis of SARS-CoV-2 by FDA under an Emergency Use Authorization (EUA). This EUA will remain in effect (meaning this test can be used) for the duration of the COVID-19 declaration under Section 564(b)(1) of the Act, 21 U.S.C. section 360bbb-3(b)(1), unless the authorization is terminated or revoked.  Performed at [facility], [address] [place], [place] [address]     Current Facility-Administered Medications  Medication Dose Route Frequency Provider Last Rate Last Admin  . acetaminophen (TYLENOL) tablet 650 mg  650 mg Oral Q6H PRN Laqueta Due, DO   650 mg at 08/04/20 2500   Or  . acetaminophen (TYLENOL)  suppository 650 mg  650 mg Rectal Q6H PRN Laqueta Due, DO      . Chlorhexidine Gluconate Cloth 2 % PADS 6 each  6 each Topical Q0600 Laqueta Due, DO   6 each at 08/04/20 0210  . cyclobenzaprine (FLEXERIL) tablet 5 mg  5 mg Oral  TID PRN Laqueta Due, DO   5 mg at 08/04/20 0907  . enoxaparin (LOVENOX) injection 40 mg  40 mg Subcutaneous Q24H Laqueta Due, DO   40 mg at 08/04/20 4098  . folic acid (FOLVITE) tablet 1 mg  1 mg Oral Daily MacNeil, Richard G, DO   1 mg at 08/04/20 1191  . hydrOXYzine (ATARAX/VISTARIL) tablet 25 mg  25 mg Oral Q6H PRN Charm Rings, NP      . levalbuterol Pauline Aus) nebulizer solution 0.63 mg  0.63 mg Nebulization Q6H PRN MacNeil, Richard G, DO      . lidocaine (LIDODERM) 5 % 1 patch  1 patch Transdermal Q24H MacNeil, Richard G, DO      . loperamide (IMODIUM) capsule 2-4 mg  2-4 mg Oral PRN Charm Rings, NP      . LORazepam (ATIVAN) tablet 1 mg  1 mg Oral TID Charm Rings, NP       Followed by  . [START ON 08/05/2020] LORazepam (ATIVAN) tablet 1 mg  1 mg Oral TID Charm Rings, NP       Followed by  . [START ON 08/06/2020] LORazepam (ATIVAN) tablet 1 mg  1 mg Oral BID Charm Rings, NP       Followed by  . [START ON 08/08/2020] LORazepam (ATIVAN) tablet 1 mg  1 mg Oral Daily Takayla Baillie Y, NP      . magnesium oxide (MAG-OX) tablet 400 mg  400 mg Oral BID Pokhrel, Laxman, MD   400 mg at 08/04/20 0906  . mometasone-formoterol (DULERA) 200-5 MCG/ACT inhaler 2 puff  2 puff Inhalation BID Laqueta Due, DO   2 puff at 08/04/20 0827  . naloxone [facility]) injection 0.4 mg  0.4 mg Intravenous PRN Laqueta Due, DO      . nicotine (NICODERM CQ - dosed in mg/24 hours) patch 14 mg  14 mg Transdermal Daily Laqueta Due, DO   14 mg at 08/04/20 0909  . ondansetron (ZOFRAN-ODT) disintegrating tablet 4 mg  4 mg Oral Q6H PRN Charm Rings, NP      . oxyCODONE (Oxy IR/ROXICODONE) immediate release tablet 10 mg  10 mg Oral Q4H PRN Laqueta Due, DO   10 mg at 08/04/20 1025  . polyethylene glycol (MIRALAX / GLYCOLAX) packet 17 g  17 g Oral Daily PRN MacNeil, Richard G, DO      . potassium chloride SA (KLOR-CON) CR tablet 40 mEq  40 mEq Oral BID Audrea Muscat T, NP   40  mEq at 08/04/20 4782  . pregabalin (LYRICA) capsule 300 mg  300 mg Oral TID Laqueta Due, DO   300 mg at 08/04/20 0905  . promethazine (PHENERGAN) suppository 25 mg  25 mg Rectal Q6H PRN Laqueta Due, DO      . promethazine (PHENERGAN) tablet 12.5 mg  12.5 mg Oral Q6H PRN Laqueta Due, DO   12.5 mg at 08/04/20 0905  . thiamine tablet 100 mg  100 mg Oral BID Laqueta Due, DO   100 mg at 08/04/20 9562    Musculoskeletal: Strength & Muscle Tone: within normal limits Gait &  Station: did not witness Patient leans: N/A  Psychiatric Specialty Exam: Physical Exam Vitals and nursing note reviewed.  Constitutional:      Appearance: Normal appearance.  HENT:     Head: Normocephalic.     Nose: Nose normal.  Pulmonary:     Effort: Pulmonary effort is normal.  Musculoskeletal:        General: Normal range of motion.     Cervical back: Normal range of motion.  Neurological:     General: No focal deficit present.     Mental Status: She is alert and oriented to person, place, and time.  Psychiatric:        Attention and Perception: Attention and perception normal.        Mood and Affect: Mood is anxious.        Speech: Speech normal.        Behavior: Behavior normal. Behavior is cooperative.        Thought Content: Thought content normal.        Cognition and Memory: Cognition and memory normal.        Judgment: Judgment normal.     Review of Systems  Constitutional: Positive for diaphoresis.  Gastrointestinal: Positive for nausea.  Musculoskeletal: Positive for myalgias.  Psychiatric/Behavioral: Positive for substance abuse. The patient is nervous/anxious.   All other systems reviewed and are negative.   Blood pressure (!) 136/96, pulse (!) 151, temperature (P) 98.2 F (36.8 C), temperature source (P) Oral, resp. rate 18, height 5\' 1"  (1.549 m), weight 96.3 kg, last menstrual period 08/03/2020, SpO2 99 %.Body mass index is 40.11 kg/m.  General Appearance:  Disheveled  Eye Contact:  Good  Speech:  Normal Rate  Volume:  Normal  Mood:  Anxious  Affect:  Congruent  Thought Process:  Coherent and Descriptions of Associations: Intact  Orientation:  Full (Time, Place, and Person)  Thought Content:  WDL and Logical  Suicidal Thoughts:  No  Homicidal Thoughts:  No  Memory:  Immediate;   Good Recent;   Good Remote;   Good  Judgement:  Fair  Insight:  Lacking  Psychomotor Activity:  Decreased  Concentration:  Concentration: Fair and Attention Span: Fair  Recall:  08/05/2020 of Knowledge:  Fair  Language:  Good  Akathisia:  No  Handed:  Right  AIMS (if indicated):     Assets:  Housing Intimacy Leisure Time Resilience Social Support  ADL's:  Intact  Cognition:  WNL  Sleep:      Physical Exam: Physical Exam Vitals and nursing note reviewed.  Constitutional:      Appearance: Normal appearance.  HENT:     Head: Normocephalic.     Nose: Nose normal.  Pulmonary:     Effort: Pulmonary effort is normal.  Musculoskeletal:        General: Normal range of motion.     Cervical back: Normal range of motion.  Neurological:     General: No focal deficit present.     Mental Status: She is alert and oriented to person, place, and time.  Psychiatric:        Attention and Perception: Attention and perception normal.        Mood and Affect: Mood is anxious.        Speech: Speech normal.        Behavior: Behavior normal. Behavior is cooperative.        Thought Content: Thought content normal.        Cognition and Memory: Cognition  and memory normal.        Judgment: Judgment normal.    Review of Systems  Constitutional: Positive for diaphoresis.  Gastrointestinal: Positive for nausea.  Musculoskeletal: Positive for myalgias.  Psychiatric/Behavioral: Positive for substance abuse. The patient is nervous/anxious.   All other systems reviewed and are negative.  Blood pressure (!) 136/96, pulse (!) 151, temperature (P) 98.2 F (36.8 C),  temperature source (P) Oral, resp. rate 18, height 5\' 1"  (1.549 m), weight 96.3 kg, last menstrual period 08/03/2020, SpO2 99 %. Body mass index is 40.11 kg/m.  Treatment Plan Summary: Opiate use disorder: -Recommend restarting her Suboxone per PDMP of Buprenorphine-Nalox 8-2 Mg Tab TID and stop her opiates -Recommend substance abuse rehab, patient declined  Benzodiazepine use disorder: -Ativan detox protocol in place  Major depressive disorder, recurrent, moderate: -Recommend Cymbalta 20 mg daily for depression/anxiety/back pain  Anxiety: -Continue hydroxyzine 25 mg TID PRN  Disposition: No evidence of imminent risk to self or others at present.   Patient does not meet criteria for psychiatric inpatient admission. Supportive therapy provided about ongoing stressors.  08/05/2020, NP 08/04/2020 10:33 AM

## 2020-08-04 NOTE — TOC Progression Note (Signed)
Transition of Care [facility]) - Progression Note    Patient Details  Name: Vicki Moore MRN: [id number] Date of Birth: 04/08/1981  [facility] of [facility] CM/SW Contact  Geni Bers, RN Phone Number: 08/04/2020, 3:18 PM  Clinical Narrative:    Pt from home and plan to return home with no needs. TOC will continue to follow.    Expected Discharge Plan: Home/Self Care Barriers to Discharge: No Barriers Identified  Expected Discharge Plan and Services Expected Discharge Plan: Home/Self Care       Living arrangements for the past 2 months: Single Family Home                                       Social Determinants of Health (SDOH) Interventions    Readmission Risk Interventions No flowsheet data found.

## 2020-08-04 NOTE — Progress Notes (Signed)
PROGRESS NOTE  ALVINA STROTHER WNU:[id number] DOB: Feb 10, 1981 DOA: 08/03/2020 PCP: [name], MD   LOS: 1 day   Brief narrative: Vicki Moore is a 40 y.o. female, with PMH of asthma, chronic pain, anxiety, substance abuse  presented to hospital with unintentional narcotic/pain medication overdose due to intractable pain.  Patient stated that that she was having chronic abdominal pain which got worse so had extra Percocet alcohol and Xanax.  EMS was consulted and patient was brought into the hospital for encephalopathy and apnea.  She was given Narcan and bagged for 8 minutes and eventually regained mental alertness and consciousness.  She was then admitted to hospital for further evaluation and treatment.  In the ED, patient was mildly tachycardic.  Chest x-ray was unremarkable.  In the ED patient received Ativan, IV fluids, Robaxin.  Behavioral health was consulted and the patient was admitted to hospital for further evaluation and treatment at the stepdown unit.  Assessment/Plan:  Active Problems:   Anxiety state   Asthma   Polysubstance abuse (HCC)   Xanax use disorder, severe (HCC)   Benzodiazepine withdrawal (HCC)   Chronic pain   Hypokalemia   Alcohol abuse   Tobacco abuse   Chronic prescription opiate use   Unintentional narcotic/pain medication overdose Patient presented with encephalopathy after secondary to Percocet ingestion and required Narcan.  Behavioral health consulted and recommend treatment for Ativan overdose.  Patient was supposed to be on Suboxone as outpatient we will resume while in the hospital.  No narcotics will be initiated.  We will start the patient on benzodiazepine detox protocol.  Concern for benzo and/or alcohol withdrawal Was out of her Xanax for 2 days.  Alcohol level was high on admission.  Mild component of the withdrawal reported.  Behavioral health r recommend Ativan detox protocol.  Major depressive disorder recurrent and moderate  with generalized anxiety.   No suicidal ideation.  Continue Cymbalta and hydroxyzine.  Chronic pain Avoid opiates.  Symptomatic care.  We will resume Suboxone.  Tobacco abuse Continue nicotine patch.  Hypokalemia Potassium was 2.8 today.  Will replenish with IV potassium plus p.o. potassium.  Check levels in a.m.  History of asthma Compensated at this time.  Resume inhalers.   Elevated liver enzymes Likely secondary to alcohol ingestion.  Monitor closely   DVT prophylaxis: enoxaparin (LOVENOX) injection 40 mg Start: 08/04/20 1000  Code Status: Full code  Family Communication: None  Status is: Inpatient  Remains inpatient appropriate because:IV treatments appropriate due to intensity of illness or inability to take PO and Inpatient level of care appropriate due to severity of illness  Dispo: The patient is from: Home              Anticipated d/c is to: Home              Patient currently is not medically stable to d/c.   Difficult to place patient No   Consultants:  Behavioral health  Procedures:  None  Anti-infectives:  . None  Anti-infectives (From admission, onward)   None     Subjective: Today, patient was seen and examined at bedside.  Patient complains of pain and feels frustrated and depressed.  Tearful at the time of my exam  Objective: Vitals:   08/04/20 1300 08/04/20 1400  BP: 135/80 135/86  Pulse: 98 (!) 102  Resp:    Temp:    SpO2: 96% 93%    Intake/Output Summary (Last 24 hours) at 08/04/2020 1502 Last data filed at  08/04/2020 0123 Gross per 24 hour  Intake 1800.5 ml  Output --  Net 1800.5 ml   Filed Weights   08/03/20 1351 08/04/20 0153  Weight: 90.7 kg 96.3 kg   Body mass index is 40.11 kg/m.   Physical Exam: GENERAL: Patient is alert awake and oriented. Not in obvious distress.  Morbidly obese, tearful and depressed HENT: No scleral pallor or icterus. Pupils equally reactive to light. Oral mucosa is moist NECK: is  supple, no gross swelling noted. CHEST: Clear to auscultation. No crackles or wheezes.  Diminished breath sounds bilaterally. CVS: S1 and S2 heard, no murmur. Regular rate and rhythm.  ABDOMEN: Soft, non-tender, bowel sounds are present. EXTREMITIES: No edema. CNS: Cranial nerves are intact. No focal motor deficits. SKIN: warm and dry without rashes.  Data Review: I have personally reviewed the following laboratory data and studies,  CBC: Recent Labs  Lab 08/03/20 1405 08/04/20 0317  WBC 5.5 6.2  NEUTROABS 4.4  --   HGB 10.2* 9.0*  HCT 32.4* 28.9*  MCV 85.3 84.8  PLT 306 217   Basic Metabolic Panel: Recent Labs  Lab 08/03/20 1405 08/03/20 2211 08/04/20 0317  NA 144  --  140  K 3.1*  --  2.8*  CL 109  --  106  CO2 20*  --  23  GLUCOSE 136*  --  91  BUN 14  --  16  CREATININE 0.80  --  0.64  CALCIUM 8.4*  --  8.3*  MG  --  2.0 1.8  PHOS  --  3.2 3.1   Liver Function Tests: Recent Labs  Lab 08/03/20 1405 08/04/20 0317  AST 237* 118*  ALT 125* 93*  ALKPHOS 106 83  BILITOT 0.2* 0.6  PROT 7.0 5.7*  ALBUMIN 3.7 3.2*   No results for input(s): LIPASE, AMYLASE in the last 168 hours. No results for input(s): AMMONIA in the last 168 hours. Cardiac Enzymes: No results for input(s): CKTOTAL, CKMB, CKMBINDEX, TROPONINI in the last 168 hours. BNP (last 3 results) Recent Labs    11/09/19 0849 04/16/20 1006  BNP 29.7 77.5    ProBNP (last 3 results) No results for input(s): PROBNP in the last 8760 hours.  CBG: No results for input(s): GLUCAP in the last 168 hours. Recent Results (from the past 240 hour(s))  MRSA PCR Screening     Status: None   Collection Time: 08/04/20  1:49 AM   Specimen: Nasal Mucosa; Nasopharyngeal  Result Value Ref Range Status   MRSA by PCR NEGATIVE NEGATIVE Final    Comment:        The GeneXpert MRSA Assay (FDA approved for NASAL specimens only), is one component of a comprehensive MRSA colonization surveillance program. It is  not intended to diagnose MRSA infection nor to guide or monitor treatment for MRSA infections. Performed at [facility], [address] [place], [place] [address]   Resp Panel by RT-PCR (Flu A&B, Covid)     Status: None   Collection Time: 08/04/20  3:53 AM  Result Value Ref Range Status   SARS Coronavirus 2 by RT PCR NEGATIVE NEGATIVE Final    Comment: (NOTE) SARS-CoV-2 target nucleic acids are NOT DETECTED.  The SARS-CoV-2 RNA is generally detectable in upper respiratory specimens during the acute phase of infection. The lowest concentration of SARS-CoV-2 viral copies this assay can detect is 138 copies/mL. A negative result does not preclude SARS-Cov-2 infection and should not be used as the sole basis for treatment or  other patient management decisions. A negative result may occur with  improper specimen collection/handling, submission of specimen other than nasopharyngeal swab, presence of viral mutation(s) within the areas targeted by this assay, and inadequate number of viral copies(<138 copies/mL). A negative result must be combined with clinical observations, patient history, and epidemiological information. The expected result is Negative.  Fact Sheet for Patients:  BloggerCourse.com  Fact Sheet for Healthcare Providers:  SeriousBroker.it  This test is no t yet approved or cleared by the [place] FDA and  has been authorized for detection and/or diagnosis of SARS-CoV-2 by FDA under an Emergency Use Authorization (EUA). This EUA will remain  in effect (meaning this test can be used) for the duration of the COVID-19 declaration under Section 564(b)(1) of the Act, 21 U.S.C.section 360bbb-3(b)(1), unless the authorization is terminated  or revoked sooner.       Influenza A by PCR NEGATIVE NEGATIVE Final   Influenza B by PCR NEGATIVE NEGATIVE Final    Comment: (NOTE) The Xpert Xpress  SARS-CoV-2/FLU/RSV plus assay is intended as an aid in the diagnosis of influenza from Nasopharyngeal swab specimens and should not be used as a sole basis for treatment. Nasal washings and aspirates are unacceptable for Xpert Xpress SARS-CoV-2/FLU/RSV testing.  Fact Sheet for Patients: BloggerCourse.com  Fact Sheet for Healthcare Providers: SeriousBroker.it  This test is not yet approved or cleared by the [place] FDA and has been authorized for detection and/or diagnosis of SARS-CoV-2 by FDA under an Emergency Use Authorization (EUA). This EUA will remain in effect (meaning this test can be used) for the duration of the COVID-19 declaration under Section 564(b)(1) of the Act, 21 U.S.C. section 360bbb-3(b)(1), unless the authorization is terminated or revoked.  Performed at [facility], [address] [place], [place] [address]      Studies: DG Chest 2 View  Result Date: 08/03/2020 CLINICAL DATA:  Overdose of Percocet. EXAM: CHEST - 2 VIEW COMPARISON:  None. FINDINGS: The heart size and mediastinal contours are within normal limits. Both lungs are clear. The visualized skeletal structures are unremarkable. IMPRESSION: No active cardiopulmonary disease. Electronically Signed   By: Gerome Sam III M.D   On: 08/03/2020 15:29      Joycelyn Das, MD  [facility] 08/04/2020  If 7PM-7AM, please contact night-coverage

## 2020-08-04 NOTE — Progress Notes (Signed)
Pt slept soundly from 10am until 3pm this shift.  Awoke c/o pain 9/10, nausea, and itching.  RN observed pt rocking back and forth in bed.  Vital signs unchanged.  Acetaminophen, phenergan, and atarax given.  MD aware, additional orders placed.  RN will continue to closely monitor.  Ulis Rias, RN 08/04/2020 3:50 PM

## 2020-08-04 NOTE — ED Notes (Signed)
Walked in to room, patient was asleep. Advised she was going upstairs to her room. Patient is ready for transport.

## 2020-08-04 NOTE — Progress Notes (Signed)
1800 dose of ativan held.  Pt sleeping, O2 sat 89, 3L Nasal cannula applied, pt moved to high fowlers.  O2 sat 98-100.  Pt comfortable and eating dinner before RN leaving room.  RN will continue to monitor.

## 2020-08-05 ENCOUNTER — Inpatient Hospital Stay (HOSPITAL_COMMUNITY): Payer: Medicaid Other

## 2020-08-05 DIAGNOSIS — F13230 Sedative, hypnotic or anxiolytic dependence with withdrawal, uncomplicated: Secondary | ICD-10-CM | POA: Diagnosis not present

## 2020-08-05 DIAGNOSIS — F411 Generalized anxiety disorder: Secondary | ICD-10-CM | POA: Diagnosis not present

## 2020-08-05 DIAGNOSIS — F1010 Alcohol abuse, uncomplicated: Secondary | ICD-10-CM | POA: Diagnosis not present

## 2020-08-05 DIAGNOSIS — J45909 Unspecified asthma, uncomplicated: Secondary | ICD-10-CM | POA: Diagnosis not present

## 2020-08-05 LAB — COMPREHENSIVE METABOLIC PANEL
ALT: 75 U/L — ABNORMAL HIGH (ref 0–44)
AST: 65 U/L — ABNORMAL HIGH (ref 15–41)
Albumin: 3.2 g/dL — ABNORMAL LOW (ref 3.5–5.0)
Alkaline Phosphatase: 82 U/L (ref 38–126)
Anion gap: 9 (ref 5–15)
BUN: 16 mg/dL (ref 6–20)
CO2: 26 mmol/L (ref 22–32)
Calcium: 8.8 mg/dL — ABNORMAL LOW (ref 8.9–10.3)
Chloride: 105 mmol/L (ref 98–111)
Creatinine, Ser: 0.64 mg/dL (ref 0.44–1.00)
GFR, Estimated: >60 mL/min (ref >60)
Glucose, Bld: 107 mg/dL — ABNORMAL HIGH (ref 70–99)
Potassium: 3.4 mmol/L — ABNORMAL LOW (ref 3.5–5.1)
Sodium: 140 mmol/L (ref 135–145)
Total Bilirubin: 0.7 mg/dL (ref 0.3–1.2)
Total Protein: 5.8 g/dL — ABNORMAL LOW (ref 6.5–8.1)

## 2020-08-05 LAB — MAGNESIUM: Magnesium: 1.8 mg/dL (ref 1.7–2.4)

## 2020-08-05 LAB — CBC
HCT: 29.9 % — ABNORMAL LOW (ref 36.0–46.0)
Hemoglobin: 9.1 g/dL — ABNORMAL LOW (ref 12.0–15.0)
MCH: 26.5 pg (ref 26.0–34.0)
MCHC: 30.4 g/dL (ref 30.0–36.0)
MCV: 86.9 fL (ref 80.0–100.0)
Platelets: 207 10*3/uL (ref 150–400)
RBC: 3.44 MIL/uL — ABNORMAL LOW (ref 3.87–5.11)
RDW: 20.6 % — ABNORMAL HIGH (ref 11.5–15.5)
WBC: 4.5 10*3/uL (ref 4.0–10.5)
nRBC: 0 % (ref 0.0–0.2)

## 2020-08-05 LAB — PHOSPHORUS: Phosphorus: 4.2 mg/dL (ref 2.5–4.6)

## 2020-08-05 MED ORDER — IOHEXOL 350 MG/ML SOLN
75.0000 mL | Freq: Once | INTRAVENOUS | Status: AC | PRN
  Administered 2020-08-05: 75 mL via INTRAVENOUS

## 2020-08-05 MED ORDER — POTASSIUM CHLORIDE CRYS ER 20 MEQ PO TBCR
40.0000 meq | EXTENDED_RELEASE_TABLET | Freq: Two times a day (BID) | ORAL | Status: AC
  Administered 2020-08-05 (×2): 40 meq via ORAL
  Filled 2020-08-05 (×2): qty 2

## 2020-08-05 NOTE — Progress Notes (Signed)
PROGRESS NOTE  Vicki Moore GGE:[id number] DOB: [birth year] DOA: 08/03/2020 PCP: [name], MD   LOS: 2 days   Brief narrative: Vicki Moore is a 40 y.o. female, with PMH of asthma, chronic pain, anxiety, substance abuse  presented to hospital with unintentional narcotic/pain medication overdose due to intractable pain.  Patient stated that that she was having chronic abdominal pain which got worse so had extra Percocet alcohol and Xanax.  EMS was consulted and patient was brought into the hospital for encephalopathy and apnea.  She was given Narcan and bagged for 8 minutes and eventually regained mental alertness and consciousness.  She was then admitted to hospital for further evaluation and treatment.  In the ED, patient was mildly tachycardic.  Chest x-ray was unremarkable.  In the ED patient received Ativan, IV fluids, Robaxin.  Behavioral health was consulted and the patient was admitted to hospital for further evaluation and treatment at the stepdown unit.  Assessment/Plan:  Active Problems:   Anxiety state   Asthma   Polysubstance abuse (HCC)   Xanax use disorder, severe (HCC)   Benzodiazepine withdrawal (HCC)   Chronic pain   Hypokalemia   Alcohol abuse   Tobacco abuse   Chronic prescription opiate use   Unintentional narcotic/pain medication overdose Patient presented with encephalopathy after secondary to Percocet ingestion and required Narcan.  Behavioral health consulted and recommend treatment for Ativan overdose.  Patient was supposed to be on Suboxone as outpatient and has been resumed while in the hospital.  No narcotics will be initiated.  Discussed that this with behavioral health.   on benzodiazepine detox protocol.  Concern for benzo and/or alcohol withdrawal Was out of her Xanax for 2 days.  Alcohol level was high on admission.  Mild component of the withdrawal reported.  Behavioral health  recommend Ativan detox protocol.  Major depressive  disorder recurrent and moderate with generalized anxiety.   No suicidal ideation.  Continue  hydroxyzine.  Chronic pain, bilateral chest, back, shoulders. With the patient's husband about it.  Has been having pain for some time now.  Will avoid opiates.  Symptomatic care.  Resumed Suboxone.  Tobacco abuse Continue nicotine patch.  Hypokalemia Potassium improved compared to yesterday.  Potassium 3.4 today.  We will continue to replenish.  Check levels in a.m.  History of asthma Compensated at this time.  Resume inhalers.   Elevated liver enzymes Likely secondary to alcohol ingestion.  Monitor closely  DVT prophylaxis: enoxaparin (LOVENOX) injection 40 mg Start: 08/04/20 1000  Code Status: Full code  Family Communication:  I spoke with the patient's significant other Mr. Gabriel Rung on the phone and updated him about the clinical condition of the patient.  Answered all the queries he had  Status is: Inpatient  Remains inpatient appropriate because:IV treatments appropriate due to intensity of illness or inability to take PO and Inpatient level of care appropriate due to severity of illness  Dispo: The patient is from: Home              Anticipated d/c is to: Home              Patient currently is not medically stable to d/c.   Difficult to place patient No  Consultants:  Behavioral health  Procedures:  None  Anti-infectives:  . None  Anti-infectives (From admission, onward)   None     Subjective: Today, patient was seen and examined at bedside.  Still continues to complain of multiple areas of pain.  Expresses  desire for more Ativan.  Objective: Vitals:   08/05/20 0900 08/05/20 1000  BP: 128/82 (!) 145/100  Pulse: (!) 119 (!) 105  Resp: 20 14  Temp:    SpO2: 98% 98%    Intake/Output Summary (Last 24 hours) at 08/05/2020 1052 Last data filed at 08/05/2020 0800 Gross per 24 hour  Intake 240 ml  Output --  Net 240 ml   Filed Weights   08/03/20 1351  08/04/20 0153  Weight: 90.7 kg 96.3 kg   Body mass index is 40.11 kg/m.   Physical Exam: GENERAL: Patient is alert awake and oriented. Not in obvious distress.  Morbidly obese, anxious HENT: No scleral pallor or icterus. Pupils equally reactive to light. Oral mucosa is moist NECK: is supple, no gross swelling noted. CHEST: Clear to auscultation. No crackles or wheezes.  Diminished breath sounds bilaterally.  Nonspecific tenderness on the chest wall and shoulder. CVS: S1 and S2 heard, no murmur. Regular rate and rhythm.  ABDOMEN: Soft, non-tender, bowel sounds are present. EXTREMITIES: No edema. CNS: Cranial nerves are intact. No focal motor deficits. SKIN: warm and dry without rashes.  Data Review: I have personally reviewed the following laboratory data and studies,  CBC: Recent Labs  Lab 08/03/20 1405 08/04/20 0317 08/05/20 0343  WBC 5.5 6.2 4.5  NEUTROABS 4.4  --   --   HGB 10.2* 9.0* 9.1*  HCT 32.4* 28.9* 29.9*  MCV 85.3 84.8 86.9  PLT 306 217 207   Basic Metabolic Panel: Recent Labs  Lab 08/03/20 1405 08/03/20 2211 08/04/20 0317 08/05/20 0343  NA 144  --  140 140  K 3.1*  --  2.8* 3.4*  CL 109  --  106 105  CO2 20*  --  23 26  GLUCOSE 136*  --  91 107*  BUN 14  --  16 16  CREATININE 0.80  --  0.64 0.64  CALCIUM 8.4*  --  8.3* 8.8*  MG  --  2.0 1.8 1.8  PHOS  --  3.2 3.1 4.2   Liver Function Tests: Recent Labs  Lab 08/03/20 1405 08/04/20 0317 08/05/20 0343  AST 237* 118* 65*  ALT 125* 93* 75*  ALKPHOS 106 83 82  BILITOT 0.2* 0.6 0.7  PROT 7.0 5.7* 5.8*  ALBUMIN 3.7 3.2* 3.2*   No results for input(s): LIPASE, AMYLASE in the last 168 hours. No results for input(s): AMMONIA in the last 168 hours. Cardiac Enzymes: No results for input(s): CKTOTAL, CKMB, CKMBINDEX, TROPONINI in the last 168 hours. BNP (last 3 results) Recent Labs    11/09/19 0849 04/16/20 1006  BNP 29.7 77.5    ProBNP (last 3 results) No results for input(s): PROBNP in the  last 8760 hours.  CBG: No results for input(s): GLUCAP in the last 168 hours. Recent Results (from the past 240 hour(s))  MRSA PCR Screening     Status: None   Collection Time: 08/04/20  1:49 AM   Specimen: Nasal Mucosa; Nasopharyngeal  Result Value Ref Range Status   MRSA by PCR NEGATIVE NEGATIVE Final    Comment:        The GeneXpert MRSA Assay (FDA approved for NASAL specimens only), is one component of a comprehensive MRSA colonization surveillance program. It is not intended to diagnose MRSA infection nor to guide or monitor treatment for MRSA infections. Performed at [facility], [address], [place], [place] [address]   Resp Panel by RT-PCR (Flu A&B, Covid)     Status: None  Collection Time: 08/04/20  3:53 AM  Result Value Ref Range Status   SARS Coronavirus 2 by RT PCR NEGATIVE NEGATIVE Final    Comment: (NOTE) SARS-CoV-2 target nucleic acids are NOT DETECTED.  The SARS-CoV-2 RNA is generally detectable in upper respiratory specimens during the acute phase of infection. The lowest concentration of SARS-CoV-2 viral copies this assay can detect is 138 copies/mL. A negative result does not preclude SARS-Cov-2 infection and should not be used as the sole basis for treatment or other patient management decisions. A negative result may occur with  improper specimen collection/handling, submission of specimen other than nasopharyngeal swab, presence of viral mutation(s) within the areas targeted by this assay, and inadequate number of viral copies(<138 copies/mL). A negative result must be combined with clinical observations, patient history, and epidemiological information. The expected result is Negative.  Fact Sheet for Patients:  BloggerCourse.com  Fact Sheet for Healthcare Providers:  SeriousBroker.it  This test is no t yet approved or cleared by the [place] FDA and  has been  authorized for detection and/or diagnosis of SARS-CoV-2 by FDA under an Emergency Use Authorization (EUA). This EUA will remain  in effect (meaning this test can be used) for the duration of the COVID-19 declaration under Section 564(b)(1) of the Act, 21 U.S.C.section 360bbb-3(b)(1), unless the authorization is terminated  or revoked sooner.       Influenza A by PCR NEGATIVE NEGATIVE Final   Influenza B by PCR NEGATIVE NEGATIVE Final    Comment: (NOTE) The Xpert Xpress SARS-CoV-2/FLU/RSV plus assay is intended as an aid in the diagnosis of influenza from Nasopharyngeal swab specimens and should not be used as a sole basis for treatment. Nasal washings and aspirates are unacceptable for Xpert Xpress SARS-CoV-2/FLU/RSV testing.  Fact Sheet for Patients: BloggerCourse.com  Fact Sheet for Healthcare Providers: SeriousBroker.it  This test is not yet approved or cleared by the [place] FDA and has been authorized for detection and/or diagnosis of SARS-CoV-2 by FDA under an Emergency Use Authorization (EUA). This EUA will remain in effect (meaning this test can be used) for the duration of the COVID-19 declaration under Section 564(b)(1) of the Act, 21 U.S.C. section 360bbb-3(b)(1), unless the authorization is terminated or revoked.  Performed at [facility], [address], [place], [place] [address]      Studies: DG Chest 2 View  Result Date: 08/03/2020 CLINICAL DATA:  Overdose of Percocet. EXAM: CHEST - 2 VIEW COMPARISON:  None. FINDINGS: The heart size and mediastinal contours are within normal limits. Both lungs are clear. The visualized skeletal structures are unremarkable. IMPRESSION: No active cardiopulmonary disease. Electronically Signed   By: Gerome Sam III M.D   On: 08/03/2020 15:29      Joycelyn Das, MD  [facility] 08/05/2020  If 7PM-7AM, please contact  night-coverage

## 2020-08-06 DIAGNOSIS — F13230 Sedative, hypnotic or anxiolytic dependence with withdrawal, uncomplicated: Secondary | ICD-10-CM | POA: Diagnosis not present

## 2020-08-06 DIAGNOSIS — F1010 Alcohol abuse, uncomplicated: Secondary | ICD-10-CM | POA: Diagnosis not present

## 2020-08-06 DIAGNOSIS — F411 Generalized anxiety disorder: Secondary | ICD-10-CM | POA: Diagnosis not present

## 2020-08-06 DIAGNOSIS — J45909 Unspecified asthma, uncomplicated: Secondary | ICD-10-CM | POA: Diagnosis not present

## 2020-08-06 LAB — BASIC METABOLIC PANEL
Anion gap: 6 (ref 5–15)
BUN: 19 mg/dL (ref 6–20)
CO2: 28 mmol/L (ref 22–32)
Calcium: 9.3 mg/dL (ref 8.9–10.3)
Chloride: 104 mmol/L (ref 98–111)
Creatinine, Ser: 0.68 mg/dL (ref 0.44–1.00)
GFR, Estimated: >60 mL/min (ref >60)
Glucose, Bld: 97 mg/dL (ref 70–99)
Potassium: 5 mmol/L (ref 3.5–5.1)
Sodium: 138 mmol/L (ref 135–145)

## 2020-08-06 LAB — CBC
HCT: 30.9 % — ABNORMAL LOW (ref 36.0–46.0)
Hemoglobin: 9.3 g/dL — ABNORMAL LOW (ref 12.0–15.0)
MCH: 26.3 pg (ref 26.0–34.0)
MCHC: 30.1 g/dL (ref 30.0–36.0)
MCV: 87.3 fL (ref 80.0–100.0)
Platelets: 198 10*3/uL (ref 150–400)
RBC: 3.54 MIL/uL — ABNORMAL LOW (ref 3.87–5.11)
RDW: 20.4 % — ABNORMAL HIGH (ref 11.5–15.5)
WBC: 5.7 10*3/uL (ref 4.0–10.5)
nRBC: 0 % (ref 0.0–0.2)

## 2020-08-06 LAB — PHOSPHORUS: Phosphorus: 4.6 mg/dL (ref 2.5–4.6)

## 2020-08-06 LAB — MAGNESIUM: Magnesium: 1.7 mg/dL (ref 1.7–2.4)

## 2020-08-06 MED ORDER — DULOXETINE HCL 20 MG PO CPEP
20.0000 mg | ORAL_CAPSULE | Freq: Every day | ORAL | Status: DC
  Administered 2020-08-06: 20 mg via ORAL
  Filled 2020-08-06: qty 1

## 2020-08-06 MED ORDER — OXYCODONE-ACETAMINOPHEN 5-325 MG PO TABS
2.0000 | ORAL_TABLET | Freq: Once | ORAL | Status: AC
  Administered 2020-08-06: 2 via ORAL
  Filled 2020-08-06: qty 2

## 2020-08-06 MED ORDER — DULOXETINE HCL 20 MG PO CPEP: 40.0000 mg | ORAL_CAPSULE | Freq: Every day | ORAL | 2 refills | Status: DC

## 2020-08-06 MED ORDER — HYDROXYZINE HCL 25 MG PO TABS: 25.0000 mg | ORAL_TABLET | Freq: Three times a day (TID) | ORAL | 0 refills | Status: DC | PRN

## 2020-08-06 MED ORDER — CYCLOBENZAPRINE HCL 5 MG PO TABS: 10.0000 mg | ORAL_TABLET | Freq: Three times a day (TID) | ORAL | 1 refills | Status: DC | PRN

## 2020-08-06 MED ORDER — NICOTINE 14 MG/24HR TD PT24: 14.0000 mg | MEDICATED_PATCH | Freq: Every day | TRANSDERMAL | 0 refills | Status: AC

## 2020-08-06 NOTE — Discharge Summary (Signed)
Physician Discharge Summary  Vicki Moore MVH:[id number] DOB: [birth year] DOA: 08/03/2020  PCP: [name], MD  Admit date: 08/03/2020 Discharge date: 08/06/2020  Admitted From: Home  Discharge disposition: Home  Recommendations for Outpatient Follow-Up:   . Follow up with your primary care provider in one week.  . Check CBC, BMP, magnesium in the next visit . Follow-up with the pain management clinic to discuss about pain management Suboxone but  Discharge Diagnosis:   Active Problems:   Anxiety state   Asthma   Polysubstance abuse (HCC)   Xanax use disorder, severe (HCC)   Benzodiazepine withdrawal (HCC)   Chronic pain   Hypokalemia   Alcohol abuse   Tobacco abuse   Chronic prescription opiate use  Discharge Condition: Improved.  Diet recommendation: Low sodium, heart healthy.    Wound care: None.  Code status: Full.   History of Present Illness:   Vicki Danser Stapletonis a 40 y.o.female, with PMH ofasthma, chronic pain, anxiety, substance abuse presented to hospital with unintentional narcotic/pain medication overdose due to intractable pain.  Patient stated that that she was having chronic abdominal pain which got worse so had extra Percocet alcohol and Xanax.  EMS was consulted and patient was brought into the hospital for encephalopathy and apnea.  She was given Narcan and bagged for 8 minutes and eventually regained mental alertness and consciousness.  She was then admitted to hospital for further evaluation and treatment.  In the ED, patient was mildly tachycardic.  Chest x-ray was unremarkable.  In the ED patient received Ativan, IV fluids, Robaxin.  Behavioral health was consulted and the patient was admitted to hospital for further evaluation and treatment at the stepdown unit.  Hospital Course:   Following conditions were addressed during hospitalization as listed below,  Unintentional narcotic/pain medication overdose Patient presented with  encephalopathy after secondary to Percocet ingestion and required Narcan.  Behavioral health consulted and recommend treatment for Ativan overdose.  Patient was supposed to be on Suboxone as outpatient and has was resumed while in the hospital.  Patient was also started on benzodiazepine detox protocol.  Patient was not motivated for further detox and he wished to remain on narcotics.  States he was advised to follow-up with pain management clinic for further discussion regarding narcotics in Suboxone.  Patient has high risk of overdose so further narcotics have not been prescribed.  This was thoroughly explained to the patient.  Concern for benzo and/or alcohol withdrawal Behavioral health  recommend Ativan detox protocol.  Continue Atarax Cymbalta Lyrica on discharge.  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   No suicidal ideation.  Continue  hydroxyzine, Cymbalta, Lyrica  Chronic pain, bilateral chest, back, shoulders. Possibility of fibromyalgia.  Discussed importance of reducing stress and anxiety and medications for depression.  She is not really interested in different modalities but mostly wishes narcotics.  I have encouraged her to discuss this with her pain management clinic.  Dose of muscle relaxant was increased as well.  Continue Cymbalta and Lyrica on discharge.  Tobacco abuse Continue nicotine patch on discharge.  Hypokalemia Replenished and Improved  History of asthma Compensated at this time.    Elevated liver enzymes Likely secondary to alcohol ingestion.  Monitor closely as outpatient.  Disposition.  At this time, patient is stable for disposition home with PCP follow-up.  Medical Consultants:    None.  Procedures:    None Subjective:   Today, patient was seen and examined at bedside.  Complains of  pain of the chest shoulder back, pain on touching.  Discharge Exam:   Vitals:   08/06/20 1145 08/06/20 1200  BP:  (!)  145/128  Pulse:  (!) 121  Resp:  (!) 21  Temp: 98.2 F (36.8 C)   SpO2:  99%   Vitals:   08/06/20 1000 08/06/20 1100 08/06/20 1145 08/06/20 1200  BP: (!) 121/56 133/88  (!) 145/128  Pulse:  (!) 105  (!) 121  Resp: 10 13  (!) 21  Temp:   98.2 F (36.8 C)   TempSrc:      SpO2:  98%  99%  Weight:      Height:       General: Alert awake, not in obvious distress, anxious tearful at times, obese HENT: pupils equally reacting to light,  No scleral pallor or icterus noted. Oral mucosa is moist.  Chest:  Clear breath sounds.  Diminished breath sounds bilaterally. No crackles or wheezes.  Tenderness on palpation of the chest wall on both sides scapular region and back. CVS: S1 &S2 heard. No murmur.  Regular rate and rhythm. Abdomen: Soft, nontender, nondistended.  Bowel sounds are heard.   Extremities: No cyanosis, clubbing or edema.  Peripheral pulses are palpable. Psych: Alert, awake and oriented, normal mood CNS:  No cranial nerve deficits.  Power equal in all extremities.   Skin: Warm and dry.  No rashes noted.  The results of significant diagnostics from this hospitalization (including imaging, microbiology, ancillary and laboratory) are listed below for reference.     Diagnostic Studies:   DG Chest 2 View  Result Date: 08/03/2020 CLINICAL DATA:  Overdose of Percocet. EXAM: CHEST - 2 VIEW COMPARISON:  None. FINDINGS: The heart size and mediastinal contours are within normal limits. Both lungs are clear. The visualized skeletal structures are unremarkable. IMPRESSION: No active cardiopulmonary disease. Electronically Signed   By: Gerome Sam III M.D   On: 08/03/2020 15:29     Labs:   Basic Metabolic Panel: Recent Labs  Lab 08/03/20 1405 08/03/20 2211 08/04/20 0317 08/05/20 0343 08/06/20 0313  NA 144  --  140 140 138  K 3.1*  --  2.8* 3.4* 5.0  CL 109  --  106 105 104  CO2 20*  --  23 26 28   GLUCOSE 136*  --  91 107* 97  BUN 14  --  16 16 19   CREATININE 0.80  --   0.64 0.64 0.68  CALCIUM 8.4*  --  8.3* 8.8* 9.3  MG  --  2.0 1.8 1.8 1.7  PHOS  --  3.2 3.1 4.2 4.6   GFR Estimated Creatinine Clearance: 101.2 mL/min (by C-G formula based on SCr of 0.68 mg/dL). Liver Function Tests: Recent Labs  Lab 08/03/20 1405 08/04/20 0317 08/05/20 0343  AST 237* 118* 65*  ALT 125* 93* 75*  ALKPHOS 106 83 82  BILITOT 0.2* 0.6 0.7  PROT 7.0 5.7* 5.8*  ALBUMIN 3.7 3.2* 3.2*   No results for input(s): LIPASE, AMYLASE in the last 168 hours. No results for input(s): AMMONIA in the last 168 hours. Coagulation profile No results for input(s): INR, PROTIME in the last 168 hours.  CBC: Recent Labs  Lab 08/03/20 1405 08/04/20 0317 08/05/20 0343 08/06/20 0313  WBC 5.5 6.2 4.5 5.7  NEUTROABS 4.4  --   --   --   HGB 10.2* 9.0* 9.1* 9.3*  HCT 32.4* 28.9* 29.9* 30.9*  MCV 85.3 84.8 86.9 87.3  PLT 306 217 207 198  Cardiac Enzymes: No results for input(s): CKTOTAL, CKMB, CKMBINDEX, TROPONINI in the last 168 hours. BNP: Invalid input(s): POCBNP CBG: No results for input(s): GLUCAP in the last 168 hours. D-Dimer No results for input(s): DDIMER in the last 72 hours. Hgb A1c No results for input(s): HGBA1C in the last 72 hours. Lipid Profile No results for input(s): CHOL, HDL, LDLCALC, TRIG, CHOLHDL, LDLDIRECT in the last 72 hours. Thyroid function studies No results for input(s): TSH, T4TOTAL, T3FREE, THYROIDAB in the last 72 hours.  Invalid input(s): FREET3 Anemia work up No results for input(s): VITAMINB12, FOLATE, FERRITIN, TIBC, IRON, RETICCTPCT in the last 72 hours. Microbiology Recent Results (from the past 240 hour(s))  MRSA PCR Screening     Status: None   Collection Time: 08/04/20  1:49 AM   Specimen: Nasal Mucosa; Nasopharyngeal  Result Value Ref Range Status   MRSA by PCR NEGATIVE NEGATIVE Final    Comment:        The GeneXpert MRSA Assay (FDA approved for NASAL specimens only), is one component of a comprehensive MRSA  colonization surveillance program. It is not intended to diagnose MRSA infection nor to guide or monitor treatment for MRSA infections. Performed at [facility], [address] [place], [place] [id number]   Resp Panel by RT-PCR (Flu A&B, Covid)     Status: None   Collection Time: 08/04/20  3:53 AM  Result Value Ref Range Status   SARS Coronavirus 2 by RT PCR NEGATIVE NEGATIVE Final    Comment: (NOTE) SARS-CoV-2 target nucleic acids are NOT DETECTED.  The SARS-CoV-2 RNA is generally detectable in upper respiratory specimens during the acute phase of infection. The lowest concentration of SARS-CoV-2 viral copies this assay can detect is 138 copies/mL. A negative result does not preclude SARS-Cov-2 infection and should not be used as the sole basis for treatment or other patient management decisions. A negative result may occur with  improper specimen collection/handling, submission of specimen other than nasopharyngeal swab, presence of viral mutation(s) within the areas targeted by this assay, and inadequate number of viral copies(<138 copies/mL). A negative result must be combined with clinical observations, patient history, and epidemiological information. The expected result is Negative.  Fact Sheet for Patients:  BloggerCourse.comhttps://www.fda.gov/media/152166/download  Fact Sheet for Healthcare Providers:  SeriousBroker.ithttps://www.fda.gov/media/152162/download  This test is no t yet approved or cleared by the [place] FDA and  has been authorized for detection and/or diagnosis of SARS-CoV-2 by FDA under an Emergency Use Authorization (EUA). This EUA will remain  in effect (meaning this test can be used) for the duration of the COVID-19 declaration under Section 564(b)(1) of the Act, 21 U.S.C.section 360bbb-3(b)(1), unless the authorization is terminated  or revoked sooner.       Influenza A by PCR NEGATIVE NEGATIVE Final   Influenza B by PCR NEGATIVE NEGATIVE Final     Comment: (NOTE) The Xpert Xpress SARS-CoV-2/FLU/RSV plus assay is intended as an aid in the diagnosis of influenza from Nasopharyngeal swab specimens and should not be used as a sole basis for treatment. Nasal washings and aspirates are unacceptable for Xpert Xpress SARS-CoV-2/FLU/RSV testing.  Fact Sheet for Patients: BloggerCourse.comhttps://www.fda.gov/media/152166/download  Fact Sheet for Healthcare Providers: SeriousBroker.ithttps://www.fda.gov/media/152162/download  This test is not yet approved or cleared by the [place] FDA and has been authorized for detection and/or diagnosis of SARS-CoV-2 by FDA under an Emergency Use Authorization (EUA). This EUA will remain in effect (meaning this test can be used) for the duration of the COVID-19 declaration under Section  564(b)(1) of the Act, 21 U.S.C. section 360bbb-3(b)(1), unless the authorization is terminated or revoked.  Performed at [facility], [address], [place], [place] [address]      Discharge Instructions:   Discharge Instructions     Diet - low sodium heart healthy   Complete by: As directed    Discharge instructions   Complete by: As directed    Follow-up with pain management clinic discuss about pain medications/ Suboxone.  Please do not drink alcohol or take oxycodone without prescription.  Follow-up with your GI doctor for your stomach issues.   Increase activity slowly   Complete by: As directed       Allergies as of 08/06/2020       Reactions   Other Shortness Of Breath, Hives   Peanut-containing Drug Products Hives, Shortness Of Breath   Penicillins Anaphylaxis, Hives, Nausea And Vomiting   Other reaction(s): RASH        Medication List     STOP taking these medications    alprazolam 2 MG tablet Commonly known as: XANAX   traZODone 50 MG tablet Commonly known as: DESYREL       TAKE these medications    Advair Diskus 500-50 MCG/DOSE Aepb Generic drug: Fluticasone-Salmeterol Inhale 1  puff into the lungs 2 (two) times daily.   albuterol 108 (90 Base) MCG/ACT inhaler Commonly known as: VENTOLIN HFA Inhale 2 puffs into the lungs every 6 (six) hours as needed for wheezing or shortness of breath. For shortness of breath   cyclobenzaprine 5 MG tablet Commonly known as: FLEXERIL Take 2 tablets (10 mg total) by mouth 3 (three) times daily as needed for muscle spasms. What changed: how much to take   dicyclomine 20 MG tablet Commonly known as: BENTYL Take 1 tablet (20 mg total) by mouth 3 (three) times daily as needed for spasms (abdominal pain).   DULoxetine 20 MG capsule Commonly known as: CYMBALTA Take 2 capsules (40 mg total) by mouth daily.   hydrOXYzine 25 MG tablet Commonly known as: ATARAX/VISTARIL Take 1 tablet (25 mg total) by mouth every 8 (eight) hours as needed for anxiety (anxiety/agitation).   ipratropium-albuterol 0.5-2.5 (3) MG/3ML Soln Commonly known as: DUONEB Take 3 mLs by nebulization 4 (four) times daily as needed for wheezing or shortness of breath.   lidocaine 5 % Commonly known as: LIDODERM Place 1 patch onto the skin daily. Remove & Discard patch within 12 hours or as directed by MD   loperamide 2 MG tablet Commonly known as: IMODIUM A-D Take 2 mg by mouth 4 (four) times daily as needed for diarrhea or loose stools.   methylphenidate 10 MG tablet Commonly known as: RITALIN Take 10 mg by mouth 2 (two) times daily as needed (lack of concentration). Takes with 20 mg tablet to total 30 mg   methylphenidate 20 MG tablet Commonly known as: RITALIN Take 20 mg by mouth 2 (two) times daily as needed (lack of concentration). Takes along with 10 mg tablet to total  tablet   nicotine 14 mg/24hr patch Commonly known as: NICODERM CQ - dosed in mg/24 hours Place 1 patch (14 mg total) onto the skin daily. Start taking on: August 07, 2020   oxyCODONE-acetaminophen 10-325 MG tablet Commonly known as: PERCOCET Take 1 tablet by mouth every 4  (four) hours as needed for pain.   pantoprazole 40 MG tablet Commonly known as: PROTONIX Take 1 tablet (40 mg total) by mouth 2 (two) times daily before a meal.  What changed:   when to take this  reasons to take this   pregabalin 300 MG capsule Commonly known as: LYRICA Take 300 mg by mouth 3 (three) times daily.   promethazine 25 MG tablet Commonly known as: PHENERGAN Take 1 tablet (25 mg total) by mouth every 6 (six) hours as needed for nausea or vomiting.   promethazine 25 MG suppository Commonly known as: PHENERGAN Place 1 suppository (25 mg total) rectally every 6 (six) hours as needed for refractory nausea / vomiting.   senna-docusate 8.6-50 MG tablet Commonly known as: Senokot-S Take 2 tablets by mouth at bedtime as needed for mild constipation or moderate constipation.   simethicone 125 MG chewable tablet Commonly known as: MYLICON Chew 125 mg by mouth every 6 (six) hours as needed for flatulence.   tetrahydrozoline-zinc 0.05-0.25 % ophthalmic solution Commonly known as: VISINE-AC Place 1 drop into both eyes 3 (three) times daily as needed (dry eyes).   valACYclovir 1000 MG tablet Commonly known as: VALTREX Take 1,000 mg by mouth 2 (two) times daily as needed (breakouts).        Follow-up Information     [facility]. Go to.   Specialty: Behavioral Health Why: Please walk in between hours of 8-11 M- Thursday for medication management and out patient therapy . Please plan to arrive by 7:30 am to ensure being seen.  Contact information: [address] [place] [address] [phone]                Time coordinating discharge: 39 minutes  Signed:  Armel Rabbani  [facility] 08/06/2020, 12:42 PM

## 2020-08-06 NOTE — Progress Notes (Signed)
Pt discharged. PIV removed. AVS went over with pt. Belongings were returned. Disposable scrubs given to patient to wear home. Pt was wheeled down to front.

## 2020-08-06 NOTE — TOC Progression Note (Signed)
Transition of Care [facility]) - Progression Note    Patient Details  Name: Vicki Moore MRN: [id number] Date of Birth: 06/01/1980  Transition of Care [facility]) CM/SW Contact  Golda Acre, RN Phone Number: 08/06/2020, 9:17 AM  Clinical Narrative:    40 y.o.female, with PMH ofasthma, chronic pain, anxiety, substance abuse presented to hospital with unintentional narcotic/pain medication overdose due to intractable pain.  Patient stated that that she was having chronic abdominal pain which got worse so had extra Percocet alcohol and Xanax.  EMS was consulted and patient was brought into the hospital for encephalopathy and apnea.  She was given Narcan and bagged for 8 minutes and eventually regained mental alertness and consciousness.  She was then admitted to hospital for further evaluation and treatment.  In the ED, patient was mildly tachycardic.  Chest x-ray was unremarkable.  In the ED patient received Ativan, IV fluids, Robaxin.  Behavioral health was consulted and the patient was admitted to hospital for further evaluation and treatment at the stepdown unit.  Assessment/Plan:  Active Problems:   Anxiety state   Asthma   Polysubstance abuse (HCC)   Xanax use disorder, severe (HCC)   Benzodiazepine withdrawal (HCC)   Chronic pain   Hypokalemia   Alcohol abuse   Tobacco abuse   Chronic prescription opiate use  Unintentional narcotic/pain medication overdose Patient presented with encephalopathy after secondary to Percocet ingestion and required Narcan.  Behavioral health consulted and recommend treatment for Ativan overdose.  Patient was supposed to be on Suboxone as outpatient and has been resumed while in the hospital.  No narcotics will be initiated.  Discussed that this with behavioral health.   on benzodiazepine detox protocol.  Concern for benzo and/or alcohol withdrawal Was out of her Xanax for 2 days.  Alcohol level was high on admission.  Mild component of the  withdrawal reported.  Behavioral health  recommend Ativan detox protocol.  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   No suicidal ideation.  Continue  hydroxyzine.  Chronic pain, bilateral chest, back, shoulders. With the patient's husband about it.  Has been having pain for some time now.  Will avoid opiates.  Symptomatic care.  Resumed Suboxone.  Tobacco abuse Continue nicotine patch.  Hypokalemia Potassium improved compared to yesterday.  Potassium 3.4 today.  We will continue to replenish.  Check levels in a.m.  History of asthma Compensated at this time.  Resume inhalers.  Elevated liver enzymes Likely secondary to alcohol ingestion.  Monitor closely   Expected Discharge Plan: Home/Self Care Barriers to Discharge: No Barriers Identified  Expected Discharge Plan and Services Expected Discharge Plan: Home/Self Care       Living arrangements for the past 2 months: Single Family Home                                       Social Determinants of Health (SDOH) Interventions    Readmission Risk Interventions No flowsheet data found.

## 2020-08-07 ENCOUNTER — Telehealth: Payer: Self-pay

## 2020-08-07 NOTE — Telephone Encounter (Signed)
Transition Care Management Follow-up Telephone Call  Date of discharge and from where: 08/06/2020, [facility]   How have you been since you were released from the hospital? She said she is not feeling well.  Her asthma is bothering her.  She said she has all medications and a nebulizer to use.  She reports that she continues to have sharp pains in the right side of her abdomen that she experienced in the hospital. She also said that her anxiety level continues to increase. She has not been seeing a Economist for the anxiety.  She was given the contact information for [facility] health center. Provided her information about walk in hours for therapy and medication management, . This CM strongly encouraged her to go to the Behavioral health center and to call them if she would like additional information   Any questions or concerns? Yes - noted above  Items Reviewed:  Did the pt receive and understand the discharge instructions provided? Yes   Medications obtained and verified? She said she has all of her medications including the new ones and did not have any questions about the med regime.  She did state that she does not have any xanax and this CM reminded her that the orders on AVS state to stop taking xanax.   Other? No   Any new allergies since your discharge? No   Dietary orders reviewed? No  Do you have support at home? Yes , her fiance  Home Care and Equipment/Supplies: Were home health services ordered? no If so, what is the name of the agency? n/a  Has the agency set up a time to come to the patient's home? not applicable Were any new equipment or medical supplies ordered?  No What is the name of the medical supply agency? n/a Were you able to get the supplies/equipment? not applicable Do you have any questions related to the use of the equipment or supplies? No   Has nebulizer.  Functional Questionnaire: (I = Independent and D =  Dependent) ADLs: independent; but she said it is tough getting around due to her pain.     Follow up appointments reviewed:   PCP Hospital f/u appt confirmed? Yes  Scheduled to see Gwinda Passe, NP at [facility].  Dr Alvis Lemmings did not have any appointments available and the patient wanted to be seen soon.  She preferred [facility] instead of [facility] due to location.   Specialist Hospital f/u appt confirmed? No , none scheduled   Are transportation arrangements needed? No   If their condition worsens, is the pt aware to call PCP or go to the Emergency Dept.? Yes; but she said she doesn't want to return to the hospital   Was the patient provided with contact information for the PCP's office or ED? Yes  Was to pt encouraged to call back with questions or concerns? Yes

## 2020-08-20 ENCOUNTER — Telehealth (INDEPENDENT_AMBULATORY_CARE_PROVIDER_SITE_OTHER): Payer: Medicaid Other | Admitting: Primary Care

## 2020-11-10 ENCOUNTER — Emergency Department (HOSPITAL_COMMUNITY): Payer: Medicaid Other

## 2020-11-10 ENCOUNTER — Other Ambulatory Visit: Payer: Self-pay

## 2020-11-10 ENCOUNTER — Encounter (HOSPITAL_COMMUNITY): Payer: Self-pay | Admitting: Oncology

## 2020-11-10 ENCOUNTER — Inpatient Hospital Stay (HOSPITAL_COMMUNITY)
Admission: EM | Admit: 2020-11-10 | Discharge: 2020-11-12 | DRG: 202 | Disposition: A | Discharge status: Home / Self Care | Payer: Medicaid Other | Attending: Internal Medicine | Admitting: Internal Medicine

## 2020-11-10 DIAGNOSIS — Z79899 Other long term (current) drug therapy: Secondary | ICD-10-CM

## 2020-11-10 DIAGNOSIS — K219 Gastro-esophageal reflux disease without esophagitis: Secondary | ICD-10-CM | POA: Diagnosis present

## 2020-11-10 DIAGNOSIS — J9601 Acute respiratory failure with hypoxia: Secondary | ICD-10-CM | POA: Diagnosis not present

## 2020-11-10 DIAGNOSIS — G8929 Other chronic pain: Secondary | ICD-10-CM

## 2020-11-10 DIAGNOSIS — R0689 Other abnormalities of breathing: Secondary | ICD-10-CM | POA: Diagnosis not present

## 2020-11-10 DIAGNOSIS — F1110 Opioid abuse, uncomplicated: Secondary | ICD-10-CM | POA: Diagnosis present

## 2020-11-10 DIAGNOSIS — Z8616 Personal history of COVID-19: Secondary | ICD-10-CM

## 2020-11-10 DIAGNOSIS — J4541 Moderate persistent asthma with (acute) exacerbation: Secondary | ICD-10-CM | POA: Diagnosis not present

## 2020-11-10 DIAGNOSIS — R7989 Other specified abnormal findings of blood chemistry: Secondary | ICD-10-CM | POA: Diagnosis not present

## 2020-11-10 DIAGNOSIS — F1910 Other psychoactive substance abuse, uncomplicated: Secondary | ICD-10-CM | POA: Diagnosis not present

## 2020-11-10 DIAGNOSIS — Z9049 Acquired absence of other specified parts of digestive tract: Secondary | ICD-10-CM

## 2020-11-10 DIAGNOSIS — J80 Acute respiratory distress syndrome: Secondary | ICD-10-CM | POA: Diagnosis not present

## 2020-11-10 DIAGNOSIS — Z91010 Allergy to peanuts: Secondary | ICD-10-CM

## 2020-11-10 DIAGNOSIS — R0600 Dyspnea, unspecified: Secondary | ICD-10-CM | POA: Diagnosis not present

## 2020-11-10 DIAGNOSIS — R0902 Hypoxemia: Secondary | ICD-10-CM | POA: Diagnosis not present

## 2020-11-10 DIAGNOSIS — Z20822 Contact with and (suspected) exposure to covid-19: Secondary | ICD-10-CM | POA: Diagnosis present

## 2020-11-10 DIAGNOSIS — J45901 Unspecified asthma with (acute) exacerbation: Principal | ICD-10-CM | POA: Diagnosis present

## 2020-11-10 DIAGNOSIS — K429 Umbilical hernia without obstruction or gangrene: Secondary | ICD-10-CM | POA: Diagnosis present

## 2020-11-10 DIAGNOSIS — F17210 Nicotine dependence, cigarettes, uncomplicated: Secondary | ICD-10-CM | POA: Diagnosis present

## 2020-11-10 DIAGNOSIS — F419 Anxiety disorder, unspecified: Secondary | ICD-10-CM | POA: Diagnosis present

## 2020-11-10 DIAGNOSIS — Z880 Allergy status to penicillin: Secondary | ICD-10-CM

## 2020-11-10 DIAGNOSIS — R000 Tachycardia, unspecified: Secondary | ICD-10-CM | POA: Diagnosis not present

## 2020-11-10 DIAGNOSIS — K529 Noninfective gastroenteritis and colitis, unspecified: Secondary | ICD-10-CM | POA: Diagnosis present

## 2020-11-10 DIAGNOSIS — F1010 Alcohol abuse, uncomplicated: Secondary | ICD-10-CM | POA: Diagnosis present

## 2020-11-10 DIAGNOSIS — F32A Depression, unspecified: Secondary | ICD-10-CM | POA: Diagnosis present

## 2020-11-10 LAB — CBC WITH DIFFERENTIAL/PLATELET
Abs Immature Granulocytes: 0.02 10*3/uL (ref 0.00–0.07)
Basophils Absolute: 0 10*3/uL (ref 0.0–0.1)
Basophils Relative: 0 %
Eosinophils Absolute: 0.2 10*3/uL (ref 0.0–0.5)
Eosinophils Relative: 2 %
HCT: 36.9 % (ref 36.0–46.0)
Hemoglobin: 11.2 g/dL — ABNORMAL LOW (ref 12.0–15.0)
Immature Granulocytes: 0 %
Lymphocytes Relative: 14 %
Lymphs Abs: 1.3 10*3/uL (ref 0.7–4.0)
MCH: 25.9 pg — ABNORMAL LOW (ref 26.0–34.0)
MCHC: 30.4 g/dL (ref 30.0–36.0)
MCV: 85.4 fL (ref 80.0–100.0)
Monocytes Absolute: 0.6 10*3/uL (ref 0.1–1.0)
Monocytes Relative: 6 %
Neutro Abs: 6.8 10*3/uL (ref 1.7–7.7)
Neutrophils Relative %: 78 %
Platelets: 316 10*3/uL (ref 150–400)
RBC: 4.32 MIL/uL (ref 3.87–5.11)
RDW: 19.4 % — ABNORMAL HIGH (ref 11.5–15.5)
WBC: 8.8 10*3/uL (ref 4.0–10.5)
nRBC: 0 % (ref 0.0–0.2)

## 2020-11-10 LAB — BLOOD GAS, VENOUS
Acid-base deficit: 4.6 mmol/L — ABNORMAL HIGH (ref 0.0–2.0)
Bicarbonate: 20.8 mmol/L (ref 20.0–28.0)
O2 Saturation: 89 %
Patient temperature: 98.6
pCO2, Ven: 42.3 mmHg — ABNORMAL LOW (ref 44.0–60.0)
pH, Ven: 7.313 (ref 7.250–7.430)
pO2, Ven: 65.2 mmHg — ABNORMAL HIGH (ref 32.0–45.0)

## 2020-11-10 LAB — RESP PANEL BY RT-PCR (FLU A&B, COVID) ARPGX2
Influenza A by PCR: NEGATIVE
Influenza B by PCR: NEGATIVE
SARS Coronavirus 2 by RT PCR: NEGATIVE

## 2020-11-10 LAB — COMPREHENSIVE METABOLIC PANEL
ALT: 114 U/L — ABNORMAL HIGH (ref 0–44)
AST: 164 U/L — ABNORMAL HIGH (ref 15–41)
Albumin: 3.7 g/dL (ref 3.5–5.0)
Alkaline Phosphatase: 167 U/L — ABNORMAL HIGH (ref 38–126)
Anion gap: 14 (ref 5–15)
BUN: 9 mg/dL (ref 6–20)
CO2: 21 mmol/L — ABNORMAL LOW (ref 22–32)
Calcium: 8.5 mg/dL — ABNORMAL LOW (ref 8.9–10.3)
Chloride: 105 mmol/L (ref 98–111)
Creatinine, Ser: 0.5 mg/dL (ref 0.44–1.00)
GFR, Estimated: >60 mL/min (ref >60)
Glucose, Bld: 138 mg/dL — ABNORMAL HIGH (ref 70–99)
Potassium: 3.6 mmol/L (ref 3.5–5.1)
Sodium: 140 mmol/L (ref 135–145)
Total Bilirubin: 0.6 mg/dL (ref 0.3–1.2)
Total Protein: 6.8 g/dL (ref 6.5–8.1)

## 2020-11-10 LAB — I-STAT BETA HCG BLOOD, ED (MC, WL, AP ONLY): I-stat hCG, quantitative: <5 m[IU]/mL (ref <5)

## 2020-11-10 LAB — LIPASE, BLOOD: Lipase: 43 U/L (ref 11–51)

## 2020-11-10 LAB — MAGNESIUM: Magnesium: 1.7 mg/dL (ref 1.7–2.4)

## 2020-11-10 MED ORDER — LEVALBUTEROL HCL 1.25 MG/0.5ML IN NEBU
1.2500 mg | INHALATION_SOLUTION | Freq: Four times a day (QID) | RESPIRATORY_TRACT | Status: DC
  Administered 2020-11-11 – 2020-11-12 (×6): 1.25 mg via RESPIRATORY_TRACT
  Filled 2020-11-10 (×6): qty 0.5

## 2020-11-10 MED ORDER — ALBUTEROL SULFATE HFA 108 (90 BASE) MCG/ACT IN AERS
2.0000 | INHALATION_SPRAY | Freq: Once | RESPIRATORY_TRACT | Status: AC
  Administered 2020-11-10: 2 via RESPIRATORY_TRACT
  Filled 2020-11-10: qty 6.7

## 2020-11-10 MED ORDER — LEVALBUTEROL HCL 1.25 MG/0.5ML IN NEBU: 1.2500 mg | INHALATION_SOLUTION | Freq: Four times a day (QID) | RESPIRATORY_TRACT | Status: DC

## 2020-11-10 MED ORDER — METHYLPREDNISOLONE SODIUM SUCC 40 MG IJ SOLR
40.0000 mg | Freq: Every day | INTRAMUSCULAR | Status: DC
  Administered 2020-11-11: 40 mg via INTRAVENOUS
  Filled 2020-11-10: qty 1

## 2020-11-10 MED ORDER — LORAZEPAM 2 MG/ML IJ SOLN
1.0000 mg | Freq: Once | INTRAMUSCULAR | Status: AC
  Administered 2020-11-10: 1 mg via INTRAVENOUS
  Filled 2020-11-10: qty 1

## 2020-11-10 MED ORDER — ENOXAPARIN SODIUM 40 MG/0.4ML IJ SOSY
40.0000 mg | PREFILLED_SYRINGE | INTRAMUSCULAR | Status: DC
  Administered 2020-11-10 – 2020-11-11 (×2): 40 mg via SUBCUTANEOUS
  Filled 2020-11-10 (×2): qty 0.4

## 2020-11-10 MED ORDER — MORPHINE SULFATE (PF) 4 MG/ML IV SOLN
4.0000 mg | Freq: Once | INTRAVENOUS | Status: AC
  Administered 2020-11-10: 4 mg via INTRAVENOUS
  Filled 2020-11-10: qty 1

## 2020-11-10 MED ORDER — KETOROLAC TROMETHAMINE 15 MG/ML IJ SOLN
15.0000 mg | Freq: Once | INTRAMUSCULAR | Status: AC
  Administered 2020-11-10: 15 mg via INTRAVENOUS
  Filled 2020-11-10: qty 1

## 2020-11-10 MED ORDER — CALCIUM CARBONATE ANTACID 500 MG PO CHEW
1.0000 | CHEWABLE_TABLET | ORAL | Status: DC | PRN
  Administered 2020-11-11 (×3): 200 mg via ORAL
  Filled 2020-11-10 (×3): qty 1

## 2020-11-10 MED ORDER — ACETAMINOPHEN 325 MG PO TABS
650.0000 mg | ORAL_TABLET | Freq: Four times a day (QID) | ORAL | Status: DC | PRN
  Administered 2020-11-11 (×2): 650 mg via ORAL
  Filled 2020-11-10 (×2): qty 2

## 2020-11-10 MED ORDER — MAGNESIUM SULFATE 2 GM/50ML IV SOLN
2.0000 g | Freq: Once | INTRAVENOUS | Status: AC
  Administered 2020-11-10: 2 g via INTRAVENOUS
  Filled 2020-11-10: qty 50

## 2020-11-10 MED ORDER — LORAZEPAM 2 MG/ML IJ SOLN: 1.0000 mg | INTRAMUSCULAR | Status: DC | PRN

## 2020-11-10 MED ORDER — LORAZEPAM 1 MG PO TABS
1.0000 mg | ORAL_TABLET | ORAL | Status: DC | PRN
  Administered 2020-11-10: 1 mg via ORAL
  Administered 2020-11-11: 3 mg via ORAL
  Administered 2020-11-11: 1 mg via ORAL
  Administered 2020-11-11: 2 mg via ORAL
  Administered 2020-11-11: 1 mg via ORAL
  Administered 2020-11-11: 3 mg via ORAL
  Administered 2020-11-11 (×2): 1 mg via ORAL
  Administered 2020-11-11 – 2020-11-12 (×3): 2 mg via ORAL
  Filled 2020-11-10 (×5): qty 1
  Filled 2020-11-10: qty 3
  Filled 2020-11-10: qty 2
  Filled 2020-11-10: qty 1
  Filled 2020-11-10: qty 3
  Filled 2020-11-10: qty 2
  Filled 2020-11-10: qty 1
  Filled 2020-11-10: qty 2

## 2020-11-10 MED ORDER — PANTOPRAZOLE SODIUM 40 MG PO TBEC
40.0000 mg | DELAYED_RELEASE_TABLET | Freq: Two times a day (BID) | ORAL | Status: DC | PRN
  Administered 2020-11-11 (×3): 40 mg via ORAL
  Filled 2020-11-10 (×3): qty 1

## 2020-11-10 MED ORDER — SODIUM CHLORIDE 0.9 % IV BOLUS
1000.0000 mL | Freq: Once | INTRAVENOUS | Status: AC
  Administered 2020-11-10: 1000 mL via INTRAVENOUS

## 2020-11-10 NOTE — ED Provider Notes (Signed)
[facility] Note   CSN: [id number] Arrival date & time: 11/10/20  1814     History Chief Complaint  Patient presents with   Respiratory Distress    Vicki Moore is a 40 y.o. female history of polysubstance abuse, asthma, COVID infections here presenting with shortness of breath and cough. Patient has a onset of shortness of breath and wheezing.  Patient was noted to be tachypneic and has diffuse wheezing.  Patient was given 15 mg of albuterol and 1 mg of Atrovent and Solu-Medrol prior to arrival.  Patient states that she has a history of asthma.  She states that she only received 1 dose of Pfizer vaccine.  She had COVID in April.  Patient was admitted in April for benzo withdrawal.  The history is provided by the patient.      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     History reviewed. No pertinent family history.  Social History   Tobacco Use   Smoking status: Some Days    Packs/day: 0.25    Years: 10.00    Pack years: 2.50    Types: Cigarettes   Smokeless tobacco: Never  Vaping Use   Vaping Use: Never used  Substance Use Topics   Alcohol use: Yes    Comment: rare   Drug use: Not Currently    Home Medications Prior to Admission medications   Medication Sig Start Date End Date Taking? Authorizing Provider  ADVAIR DISKUS 500-50 MCG/DOSE AEPB Inhale 1 puff into the lungs 2 (two) times daily. 10/11/19   [provider]  albuterol (PROVENTIL HFA;VENTOLIN HFA) 108 (90 BASE) MCG/ACT inhaler Inhale 2 puffs into the lungs every 6 (six) hours as needed for wheezing or shortness of breath. For shortness of breath    [provider]  cyclobenzaprine (FLEXERIL) 5 MG tablet Take 2 tablets (10 mg total) by mouth 3 (three) times daily as needed for muscle spasms. 08/06/20   Pokhrel, Rebekah Chesterfield, MD  dicyclomine (BENTYL) 20 MG tablet Take 1 tablet (20 mg total) by mouth 3 (three) times daily as needed for spasms (abdominal pain). Patient not taking: No sig reported 04/23/20   Long, Arlyss Repress, MD  DULoxetine (CYMBALTA) 20 MG capsule Take  2 capsules (40 mg total) by mouth daily. 08/06/20 09/05/20  Pokhrel, Rebekah ChesterfieldLaxman, MD  hydrOXYzine (ATARAX/VISTARIL) 25 MG tablet Take 1 tablet (25 mg total) by mouth every 8 (eight) hours as needed for anxiety (anxiety/agitation). 08/06/20   Pokhrel, Rebekah ChesterfieldLaxman, MD  ipratropium-albuterol (DUONEB) 0.5-2.5 (3) MG/3ML SOLN Take 3 mLs by nebulization 4 (four) times daily as needed for wheezing or shortness of breath. 10/11/19   [provider]  lidocaine (LIDODERM) 5 % Place 1 patch onto the skin daily. Remove & Discard patch within 12 hours or as directed by MD    [provider]  loperamide (IMODIUM A-D) 2 MG tablet  Take 2 mg by mouth 4 (four) times daily as needed for diarrhea or loose stools.    [provider]  methylphenidate (RITALIN) 10 MG tablet Take 10 mg by mouth 2 (two) times daily as needed (lack of concentration). Takes with 20 mg tablet to total 30 mg    [provider]  methylphenidate (RITALIN) 20 MG tablet Take 20 mg by mouth 2 (two) times daily as needed (lack of concentration). Takes along with 10 mg tablet to total 30mg  tablet    [provider]  nicotine (NICODERM CQ - DOSED IN MG/24 HOURS) 14 mg/24hr patch Place 1 patch (14 mg total) onto the skin daily. 08/07/20   Pokhrel, Rebekah ChesterfieldLaxman, MD  oxyCODONE-acetaminophen (PERCOCET) 10-325 MG tablet Take 1 tablet by mouth every 4 (four) hours as needed for pain.    [provider]  pantoprazole (PROTONIX) 40 MG tablet Take 1 tablet (40 mg total) by mouth 2 (two) times daily before a meal. Patient taking differently: Take 40 mg by mouth 2 (two) times daily as needed (heartburn). 11/15/19   Amin, Loura HaltAnkit Chirag, MD  pregabalin (LYRICA) 300 MG capsule Take 300 mg by mouth 3 (three) times daily. 03/15/20   [provider]  promethazine (PHENERGAN) 25 MG suppository Place 1 suppository (25 mg total) rectally every 6 (six) hours as needed for refractory nausea / vomiting. 04/23/20   Long, Arlyss RepressJoshua G, MD  promethazine (PHENERGAN) 25 MG tablet Take 1 tablet (25 mg total) by mouth every 6 (six) hours as needed for nausea or vomiting. 04/23/20   Long, Arlyss RepressJoshua G, MD  senna-docusate (SENOKOT-S) 8.6-50 MG tablet Take 2 tablets by mouth at bedtime as needed for mild constipation or moderate constipation. Patient not taking: No sig reported 11/15/19   Dimple NanasAmin, Ankit Chirag, MD  simethicone (MYLICON) 125 MG chewable tablet Chew 125 mg by mouth every 6 (six) hours as needed for flatulence.    [provider]  tetrahydrozoline-zinc (VISINE-AC) 0.05-0.25 % ophthalmic solution Place 1 drop into both eyes 3 (three) times daily as needed  (dry eyes).    [provider]  valACYclovir (VALTREX) 1000 MG tablet Take 1,000 mg by mouth 2 (two) times daily as needed (breakouts). 02/09/20   [provider]    Allergies    Other, Peanut-containing drug products, and Penicillins  Review of Systems   Review of Systems  Respiratory:  Positive for shortness of breath.   All other systems reviewed and are negative.  Physical Exam Updated Vital Signs BP 135/74   Pulse (!) 116   Temp 98.5 F (36.9 C) (Axillary)   Resp 14   Ht 5\' 1"  (1.549 m)   Wt 86.2 kg   LMP 10/14/2020 (Approximate)   SpO2 99%   BMI 35.90 kg/m   Physical Exam Vitals and nursing note reviewed.  Constitutional:  Comments: Tachypneic and moderate distress and talking in 2-3 word sentences  HENT:     Head: Normocephalic.     Nose: Nose normal.     Mouth/Throat:     Mouth: Mucous membranes are moist.  Eyes:     Extraocular Movements: Extraocular movements intact.     Pupils: Pupils are equal, round, and reactive to light.  Cardiovascular:     Rate and Rhythm: Regular rhythm. Tachycardia present.  Pulmonary:     Comments: Tachypneic, mild retractions, diffuse wheezing Abdominal:     General: Abdomen is flat.     Palpations: Abdomen is soft.  Musculoskeletal:        General: Normal range of motion.     Cervical back: Normal range of motion and neck supple.  Skin:    General: Skin is warm.  Neurological:     General: No focal deficit present.     Mental Status: She is oriented to person, place, and time.  Psychiatric:        Mood and Affect: Mood normal.        Behavior: Behavior normal.        Thought Content: Thought content normal.    ED Results / Procedures / Treatments   Labs (all labs ordered are listed, but only abnormal results are displayed) Labs Reviewed  BLOOD GAS, VENOUS - Abnormal; Notable for the following components:      Result Value   pCO2, Ven 42.3 (*)    pO2, Ven 65.2 (*)    Acid-base deficit 4.6  (*)    All other components within normal limits  CBC WITH DIFFERENTIAL/PLATELET - Abnormal; Notable for the following components:   Hemoglobin 11.2 (*)    MCH 25.9 (*)    RDW 19.4 (*)    All other components within normal limits  COMPREHENSIVE METABOLIC PANEL - Abnormal; Notable for the following components:   CO2 21 (*)    Glucose, Bld 138 (*)    Calcium 8.5 (*)    AST 164 (*)    ALT 114 (*)    Alkaline Phosphatase 167 (*)    All other components within normal limits  RESP PANEL BY RT-PCR (FLU A&B, COVID) ARPGX2  MAGNESIUM  LIPASE, BLOOD  I-STAT BETA HCG BLOOD, ED ([facility], [facility], [facility] ONLY)    EKG None  Radiology DG Chest Port 1 View  Result Date: 11/10/2020 CLINICAL DATA:  Dyspnea. Respiratory distress today. History of asthma. Smoker. EXAM: PORTABLE CHEST 1 VIEW COMPARISON:  08/03/2020 FINDINGS: The heart size and mediastinal contours are within normal limits. Both lungs are clear. The visualized skeletal structures are unremarkable. IMPRESSION: No active disease. Electronically Signed   By: Helyn Numbers MD   On: 11/10/2020 20:12    Procedures Procedures   CRITICAL CARE Performed by: Richardean Canal   Total critical care time: 30 minutes  Critical care time was exclusive of separately billable procedures and treating other patients.  Critical care was necessary to treat or prevent imminent or life-threatening deterioration.  Critical care was time spent personally by me on the following activities: development of treatment plan with patient and/or surrogate as well as nursing, discussions with consultants, evaluation of patient's response to treatment, examination of patient, obtaining history from patient or surrogate, ordering and performing treatments and interventions, ordering and review of laboratory studies, ordering and review of radiographic studies, pulse oximetry and re-evaluation of patient's condition.   Medications Ordered in ED Medications  albuterol (VENTOLIN  HFA) 108 (90 Base)  MCG/ACT inhaler 2 puff (has no administration in time range)  sodium chloride 0.9 % bolus 1,000 mL (0 mLs Intravenous Stopped 11/10/20 1958)  magnesium sulfate IVPB 2 g 50 mL (0 g Intravenous Stopped 11/10/20 1959)  LORazepam (ATIVAN) injection 1 mg (1 mg Intravenous Given 11/10/20 1900)  morphine 4 MG/ML injection 4 mg (4 mg Intravenous Given 11/10/20 1959)    ED Course  I have reviewed the triage vital signs and the nursing notes.  Pertinent labs & imaging results that were available during my care of the patient were reviewed by me and considered in my medical decision making (see chart for details).    MDM Rules/Calculators/A&P                          AMBER WILLIARD is a 40 y.o. female here presenting with cough and shortness of breath.  Patient has diffuse wheezing.  History of asthma and likely asthma exacerbation versus COVID.  Patient does have history of chronic pain and also anxiety.  Plan to get CBC and CMP and chest x-ray and COVID test.  Patient received Solu-Medrol and continuous neb prior to arrival.  Will give magnesium now.  8:41 PM Labs are unremarkable and chest x-ray unremarkable and COVID is negative.  Patient is still wheezing.  I was able to titrate her down to about 2 L nasal cannula.  However she is still tachycardic.  At this point will admit for asthma exacerbation with hypoxia.  She is on 10 mg of oxycodone at baseline and gave her pain medicine for her chronic pain.    Final Clinical Impression(s) / ED Diagnoses Final diagnoses:  None    Rx / DC Orders ED Discharge Orders     None        Charlynne Pander, MD 11/10/20 2042

## 2020-11-10 NOTE — ED Notes (Signed)
Patient states she is still in pain rated 10/10. Reports the pain as agonizing. Patient states she takes percocet at home

## 2020-11-10 NOTE — ED Notes (Signed)
Patient has some mild tremors when hands are extended and feels anxious. Also states she takes protonix for heartburn.

## 2020-11-10 NOTE — ED Notes (Signed)
Patient rate pain 10/10 in the RUQ. Patient states its excruciating. Currently on 2L of O2 [place]

## 2020-11-10 NOTE — H&P (Signed)
History and Physical    [name] PRF:[id number] DOB: September 20, 1980 DOA: 11/10/2020  PCP: [name], MD  Patient coming from: Home  I have personally briefly reviewed patient's old medical records in [facility]  Chief Complaint: shortness of breath   HPI: Vicki Moore is a 40 y.o. female with medical history significant for asthma, polysubstance abuse (opioids, alcohol, tobacco), GERD, chronic pain, COVID infection in January who presents with increasing worsening shortness of breath.  Patient reports that she was watching TV today when she had sudden onset of acute shortness of breath.  She also felt very hot and had to pour ice over her head.  States she has been having some coughing with productive sputum for the past few days.  Reports she has not an asthma attack for least 5 years.  She continues to smoke about 2 to 3 cigarettes/day. No vaping. States she has not felt well in the past year since she had her gallbladder removed. Has constant pain under her right rib and today they are acutely worse. Feels her abdominal hernias have been more prominent and she has been gaining weight. Has felt nausea and also has had chronic diarrhea since her cholecystectomy. Reports drinking "several long-islands and shots of whiskey" last night. Pt asking for pain medication for her RUQ pain.  Of note, pt recently admitted in April for reportedly unintentional opioid overdose requiring narcan and also had benzo withdrawal. She told pharmacy technician while I was in the room that she continues to take Percocet prescribed from a local pharmacy.   On arrival with EMS, she was reported to be hypoxic down to 84% on room air.  She was given Solu-Medrol and continuous neb prior to arrival.  In the ED she was placed on NRB and was given magnesium and unable to wean down to 2L.  Review of Systems: Constitutional: No Weight Change, No Fever ENT/Mouth: No sore throat, No Rhinorrhea Eyes:  No Eye Pain, No Vision Changes Cardiovascular: No Chest Pain, + SOB, No PND, +Dyspnea on Exertion, No Orthopnea, No Edema, No Palpitations Respiratory: + Cough, + Sputum Gastrointestinal: No Nausea, No Vomiting, No Diarrhea, No Constipation, + Pain Genitourinary: no Urinary Incontinence,  Musculoskeletal: No Arthralgias, No Myalgias Skin: No Skin Lesions, No Pruritus, Neuro: no Weakness, No Numbness Psych: No Anxiety/Panic, No Depression, no decrease appetite Heme/Lymph: No Bruising, No Bleeding  Past Medical History:  Diagnosis Date   Anxiety    Asthma    Asthma    Depression    GERD (gastroesophageal reflux disease)     Past Surgical History:  Procedure Laterality Date   CHOLECYSTECTOMY N/A 12/08/2019   Procedure: LAPAROSCOPIC CHOLECYSTECTOMY;  Surgeon: Franky Macho, MD;  Location: [facility];  Service: General;  Laterality: N/A;     reports that she has been smoking cigarettes. She has a 2.50 pack-year smoking history. She has never used smokeless tobacco. She reports current alcohol use. She reports previous drug use. Social History  Allergies  Allergen Reactions   Peanut-Containing Drug Products Hives and Shortness Of Breath   Penicillins Anaphylaxis, Hives, Nausea And Vomiting and Rash          History reviewed. No pertinent family history.   Prior to Admission medications   Medication Sig Start Date End Date Taking? Authorizing Provider  albuterol (PROVENTIL HFA;VENTOLIN HFA) 108 (90 BASE) MCG/ACT inhaler Inhale 2 puffs into the lungs every 6 (six) hours as needed for wheezing or shortness of breath. For shortness of breath  Yes [provider]  alprazolam Prudy Feeler) 2 MG tablet Take 2 mg by mouth 3 (three) times daily.   Yes [provider]  oxyCODONE-acetaminophen (PERCOCET) 10-325 MG tablet Take 1 tablet by mouth every 4 (four) hours as needed for pain.   Yes [provider]  pantoprazole (PROTONIX) 40 MG tablet Take 1 tablet (40 mg total)  by mouth 2 (two) times daily before a meal. Patient taking differently: Take 40 mg by mouth 2 (two) times daily as needed (heartburn). 11/15/19  Yes Amin, Loura Halt, MD  valACYclovir (VALTREX) 1000 MG tablet Take 1,000 mg by mouth 2 (two) times daily as needed (breakouts). 02/09/20  Yes [provider]  ADVAIR DISKUS 500-50 MCG/DOSE AEPB Inhale 1 puff into the lungs 2 (two) times daily. Patient not taking: Reported on 11/10/2020 10/11/19   [provider]  cyclobenzaprine (FLEXERIL) 5 MG tablet Take 2 tablets (10 mg total) by mouth 3 (three) times daily as needed for muscle spasms. Patient not taking: Reported on 11/10/2020 08/06/20   Joycelyn Das, MD  dicyclomine (BENTYL) 20 MG tablet Take 1 tablet (20 mg total) by mouth 3 (three) times daily as needed for spasms (abdominal pain). Patient not taking: No sig reported 04/23/20   Long, Arlyss Repress, MD  DULoxetine (CYMBALTA) 20 MG capsule Take 2 capsules (40 mg total) by mouth daily. Patient not taking: Reported on 11/10/2020 08/06/20 09/05/20  Joycelyn Das, MD  hydrOXYzine (ATARAX/VISTARIL) 25 MG tablet Take 1 tablet (25 mg total) by mouth every 8 (eight) hours as needed for anxiety (anxiety/agitation). Patient not taking: Reported on 11/10/2020 08/06/20   Pokhrel, Rebekah Chesterfield, MD  ipratropium-albuterol (DUONEB) 0.5-2.5 (3) MG/3ML SOLN Take 3 mLs by nebulization 4 (four) times daily as needed for wheezing or shortness of breath. 10/11/19   [provider]  methylphenidate (RITALIN) 10 MG tablet Take 10 mg by mouth 2 (two) times daily as needed (lack of concentration). Takes with 20 mg tablet to total 30 mg    [provider]  methylphenidate (RITALIN) 20 MG tablet Take 20 mg by mouth 2 (two) times daily as needed (lack of concentration). Takes along with 10 mg tablet to total 30mg  tablet    [provider]  nicotine (NICODERM CQ - DOSED IN MG/24 HOURS) 14 mg/24hr patch Place 1 patch (14 mg total) onto the skin  daily. Patient taking differently: Place 14 mg onto the skin daily as needed (smoking cessation). 08/07/20   Pokhrel, 08/09/20, MD  pregabalin (LYRICA) 300 MG capsule Take 300 mg by mouth 3 (three) times daily. 03/15/20   [provider]  promethazine (PHENERGAN) 25 MG suppository Place 1 suppository (25 mg total) rectally every 6 (six) hours as needed for refractory nausea / vomiting. Patient not taking: Reported on 11/10/2020 04/23/20   Long, 06/21/20, MD  promethazine (PHENERGAN) 25 MG tablet Take 1 tablet (25 mg total) by mouth every 6 (six) hours as needed for nausea or vomiting. Patient not taking: Reported on 11/10/2020 04/23/20   Long, 06/21/20, MD  senna-docusate (SENOKOT-S) 8.6-50 MG tablet Take 2 tablets by mouth at bedtime as needed for mild constipation or moderate constipation. Patient not taking: No sig reported 11/15/19   Amin, 01/15/20, MD  tetrahydrozoline-zinc (VISINE-AC) 0.05-0.25 % ophthalmic solution Place 1 drop into both eyes 3 (three) times daily as needed (dry eyes).    [provider]    Physical Exam: Vitals:   11/10/20 2015 11/10/20 2030 11/10/20 2045 11/10/20 2100  BP: 135/74 124/64  118/79 124/70  Pulse: (!) 116 (!) 110 (!) 113 (!) 113  Resp: 14 14 11 10   Temp:      TempSrc:      SpO2: 99% 95% 94% 95%  Weight:      Height:        Constitutional: NAD, calm, comfortable, young female sitting upright in bed Vitals:   11/10/20 2015 11/10/20 2030 11/10/20 2045 11/10/20 2100  BP: 135/74 124/64 118/79 124/70  Pulse: (!) 116 (!) 110 (!) 113 (!) 113  Resp: 14 14 11 10   Temp:      TempSrc:      SpO2: 99% 95% 94% 95%  Weight:      Height:       Eyes: PERRL, lids and conjunctivae normal ENMT: Mucous membranes are moist.  Neck: normal, supple Respiratory: diffuse wheezing throughout, no crackles. Normal respiratory effort on 2L. No accessory muscle use.  Cardiovascular: Regular rate and rhythm, no murmurs / rubs / gallops. No extremity edema.   Abdomen: reports RUQ tenderness, easily reducible umbilical hernia. Bowel sounds positive.  Musculoskeletal: no clubbing / cyanosis. No joint deformity upper and lower extremities. Good ROM, no contractures. Normal muscle tone.  Skin: no rashes, lesions, ulcers. No induration Neurologic: CN 2-12 grossly intact. Sensation intact, Strength 5/5 in all 4.  Psychiatric: Normal judgment and insight. Alert and oriented x 3. Normal mood.     Labs on Admission: I have personally reviewed following labs and imaging studies  CBC: Recent Labs  Lab 11/10/20 1857  WBC 8.8  NEUTROABS 6.8  HGB 11.2*  HCT 36.9  MCV 85.4  PLT 316   Basic Metabolic Panel: Recent Labs  Lab 11/10/20 1857  NA 140  K 3.6  CL 105  CO2 21*  GLUCOSE 138*  BUN 9  CREATININE 0.50  CALCIUM 8.5*  MG 1.7   GFR: Estimated Creatinine Clearance: 94.2 mL/min (by C-G formula based on SCr of 0.5 mg/dL). Liver Function Tests: Recent Labs  Lab 11/10/20 1857  AST 164*  ALT 114*  ALKPHOS 167*  BILITOT 0.6  PROT 6.8  ALBUMIN 3.7   Recent Labs  Lab 11/10/20 1857  LIPASE 43   No results for input(s): AMMONIA in the last 168 hours. Coagulation Profile: No results for input(s): INR, PROTIME in the last 168 hours. Cardiac Enzymes: No results for input(s): CKTOTAL, CKMB, CKMBINDEX, TROPONINI in the last 168 hours. BNP (last 3 results) No results for input(s): PROBNP in the last 8760 hours. HbA1C: No results for input(s): HGBA1C in the last 72 hours. CBG: No results for input(s): GLUCAP in the last 168 hours. Lipid Profile: No results for input(s): CHOL, HDL, LDLCALC, TRIG, CHOLHDL, LDLDIRECT in the last 72 hours. Thyroid Function Tests: No results for input(s): TSH, T4TOTAL, FREET4, T3FREE, THYROIDAB in the last 72 hours. Anemia Panel: No results for input(s): VITAMINB12, FOLATE, FERRITIN, TIBC, IRON, RETICCTPCT in the last 72 hours. Urine analysis:    Component Value Date/Time   COLORURINE STRAW (A)  01/24/2020 0205   APPEARANCEUR CLEAR 01/24/2020 0205   APPEARANCEUR Clear 01/10/2020 1349   LABSPEC 1.010 01/24/2020 0205   PHURINE 7.0 01/24/2020 0205   GLUCOSEU NEGATIVE 01/24/2020 0205   HGBUR NEGATIVE 01/24/2020 0205   BILIRUBINUR NEGATIVE 01/24/2020 0205   BILIRUBINUR Negative 01/10/2020 1349   KETONESUR NEGATIVE 01/24/2020 0205   PROTEINUR NEGATIVE 01/24/2020 0205   UROBILINOGEN 0.2 08/15/2010 2057   NITRITE NEGATIVE 01/24/2020 0205   LEUKOCYTESUR NEGATIVE 01/24/2020 0205    Radiological Exams on Admission:  DG Chest Port 1 View  Result Date: 11/10/2020 CLINICAL DATA:  Dyspnea. Respiratory distress today. History of asthma. Smoker. EXAM: PORTABLE CHEST 1 VIEW COMPARISON:  08/03/2020 FINDINGS: The heart size and mediastinal contours are within normal limits. Both lungs are clear. The visualized skeletal structures are unremarkable. IMPRESSION: No active disease. Electronically Signed   By: Helyn Numbers MD   On: 11/10/2020 20:12      Assessment/Plan  Acute hypoxic respiratory failure secondary to asthma exacerbation -initially on NRB able to wean down to 2L -scheduled xopenex q6hr  -daily solu-medrol -suspect tachycardia is from continuous albuterol neb and low suspicion for pulmonary embolism at this time- She had hx of COVID in January but had CTA chest in January and April without findings of PE  RUQ pain/umbilical hernia -has been constant but worse today. LFTs no worse than usual. -umbilical hernia easily reductible  -No leukocytosis to suggest infection - hold off on imaging at this time - Toradol x 1 for pain. Will need to avoid narcotic/opioid as much as possible due to hx of abuse  Elevated LFTS/Alcohol abuse -has hx of fatty liver and is chronically elevated -reports drinking several drinks last night-place on CIWA   Hx of opioid abuse -Patient noted to have had opioid overdose admission going back several years -She reports today that she is still being  prescribed Percocet from a local pharmacy.  Pharmacy technician was able to call the pharmacy and notes that she has not been prescribed any Percocet in 2022.  -[place] substance database also does not show any recent narcotic Rx. Last one was in Feb for 3 days of Suboxone.  DVT prophylaxis:.Lovenox Code Status: Full Family Communication: Plan discussed with patient at bedside  disposition Plan: Home with observation Consults called:  Admission status: Observation  Level of care: Telemetry  Status is: Observation  The patient remains OBS appropriate and will d/c before 2 midnights.  Dispo: The patient is from: Home              Anticipated d/c is to: Home              Patient currently is not medically stable to d/c.   Difficult to place patient No         Anselm Jungling DO [facility]   If 7PM-7AM, please contact night-coverage [email or web address]   11/10/2020, 9:36 PM

## 2020-11-10 NOTE — ED Triage Notes (Signed)
Pt bib [facility] d/t respiratory distress.  Given 3 nebs, solumedrol in the field.

## 2020-11-11 ENCOUNTER — Observation Stay (HOSPITAL_COMMUNITY): Payer: Medicaid Other

## 2020-11-11 DIAGNOSIS — Z8616 Personal history of COVID-19: Secondary | ICD-10-CM | POA: Diagnosis not present

## 2020-11-11 DIAGNOSIS — J45901 Unspecified asthma with (acute) exacerbation: Secondary | ICD-10-CM | POA: Diagnosis not present

## 2020-11-11 DIAGNOSIS — F1010 Alcohol abuse, uncomplicated: Secondary | ICD-10-CM | POA: Diagnosis present

## 2020-11-11 DIAGNOSIS — Z880 Allergy status to penicillin: Secondary | ICD-10-CM | POA: Diagnosis not present

## 2020-11-11 DIAGNOSIS — K429 Umbilical hernia without obstruction or gangrene: Secondary | ICD-10-CM | POA: Diagnosis present

## 2020-11-11 DIAGNOSIS — J4541 Moderate persistent asthma with (acute) exacerbation: Secondary | ICD-10-CM | POA: Diagnosis not present

## 2020-11-11 DIAGNOSIS — Z9049 Acquired absence of other specified parts of digestive tract: Secondary | ICD-10-CM | POA: Diagnosis not present

## 2020-11-11 DIAGNOSIS — F17210 Nicotine dependence, cigarettes, uncomplicated: Secondary | ICD-10-CM | POA: Diagnosis present

## 2020-11-11 DIAGNOSIS — R0600 Dyspnea, unspecified: Secondary | ICD-10-CM | POA: Diagnosis not present

## 2020-11-11 DIAGNOSIS — J9601 Acute respiratory failure with hypoxia: Secondary | ICD-10-CM | POA: Diagnosis not present

## 2020-11-11 DIAGNOSIS — F1110 Opioid abuse, uncomplicated: Secondary | ICD-10-CM | POA: Diagnosis present

## 2020-11-11 DIAGNOSIS — G8929 Other chronic pain: Secondary | ICD-10-CM | POA: Diagnosis present

## 2020-11-11 DIAGNOSIS — Z20822 Contact with and (suspected) exposure to covid-19: Secondary | ICD-10-CM | POA: Diagnosis present

## 2020-11-11 DIAGNOSIS — Z79899 Other long term (current) drug therapy: Secondary | ICD-10-CM | POA: Diagnosis not present

## 2020-11-11 DIAGNOSIS — K529 Noninfective gastroenteritis and colitis, unspecified: Secondary | ICD-10-CM | POA: Diagnosis present

## 2020-11-11 DIAGNOSIS — R1011 Right upper quadrant pain: Secondary | ICD-10-CM | POA: Diagnosis not present

## 2020-11-11 DIAGNOSIS — K219 Gastro-esophageal reflux disease without esophagitis: Secondary | ICD-10-CM | POA: Diagnosis present

## 2020-11-11 DIAGNOSIS — Z91010 Allergy to peanuts: Secondary | ICD-10-CM | POA: Diagnosis not present

## 2020-11-11 DIAGNOSIS — F32A Depression, unspecified: Secondary | ICD-10-CM | POA: Diagnosis present

## 2020-11-11 DIAGNOSIS — F419 Anxiety disorder, unspecified: Secondary | ICD-10-CM | POA: Diagnosis present

## 2020-11-11 DIAGNOSIS — R0781 Pleurodynia: Secondary | ICD-10-CM | POA: Diagnosis not present

## 2020-11-11 DIAGNOSIS — R7989 Other specified abnormal findings of blood chemistry: Secondary | ICD-10-CM | POA: Diagnosis present

## 2020-11-11 DIAGNOSIS — K760 Fatty (change of) liver, not elsewhere classified: Secondary | ICD-10-CM | POA: Diagnosis not present

## 2020-11-11 LAB — COMPREHENSIVE METABOLIC PANEL
ALT: 96 U/L — ABNORMAL HIGH (ref 0–44)
AST: 88 U/L — ABNORMAL HIGH (ref 15–41)
Albumin: 3.5 g/dL (ref 3.5–5.0)
Alkaline Phosphatase: 138 U/L — ABNORMAL HIGH (ref 38–126)
Anion gap: 7 (ref 5–15)
BUN: 12 mg/dL (ref 6–20)
CO2: 26 mmol/L (ref 22–32)
Calcium: 8.3 mg/dL — ABNORMAL LOW (ref 8.9–10.3)
Chloride: 101 mmol/L (ref 98–111)
Creatinine, Ser: 0.47 mg/dL (ref 0.44–1.00)
GFR, Estimated: >60 mL/min (ref >60)
Glucose, Bld: 140 mg/dL — ABNORMAL HIGH (ref 70–99)
Potassium: 4 mmol/L (ref 3.5–5.1)
Sodium: 134 mmol/L — ABNORMAL LOW (ref 135–145)
Total Bilirubin: 0.5 mg/dL (ref 0.3–1.2)
Total Protein: 6.5 g/dL (ref 6.5–8.1)

## 2020-11-11 LAB — HIV ANTIBODY (ROUTINE TESTING W REFLEX): HIV Screen 4th Generation wRfx: NONREACTIVE

## 2020-11-11 MED ORDER — KETOROLAC TROMETHAMINE 15 MG/ML IJ SOLN: 15.0000 mg | Freq: Four times a day (QID) | INTRAMUSCULAR | Status: DC | PRN

## 2020-11-11 MED ORDER — TRAMADOL HCL 50 MG PO TABS
50.0000 mg | ORAL_TABLET | Freq: Once | ORAL | Status: AC
  Administered 2020-11-11: 50 mg via ORAL
  Filled 2020-11-11: qty 1

## 2020-11-11 MED ORDER — KETOROLAC TROMETHAMINE 15 MG/ML IJ SOLN
15.0000 mg | Freq: Four times a day (QID) | INTRAMUSCULAR | Status: DC | PRN
Start: 2020-11-11 — End: 2020-11-12
  Administered 2020-11-11 (×2): 15 mg via INTRAVENOUS
  Filled 2020-11-11 (×3): qty 1

## 2020-11-11 MED ORDER — TRAMADOL HCL 50 MG PO TABS
50.0000 mg | ORAL_TABLET | Freq: Four times a day (QID) | ORAL | Status: DC | PRN
  Administered 2020-11-11 – 2020-11-12 (×3): 50 mg via ORAL
  Filled 2020-11-11 (×4): qty 1

## 2020-11-11 MED ORDER — LIDOCAINE 5 % EX PTCH
1.0000 | MEDICATED_PATCH | CUTANEOUS | Status: DC
  Administered 2020-11-11 – 2020-11-12 (×2): 1 via TRANSDERMAL
  Filled 2020-11-11 (×2): qty 1

## 2020-11-11 NOTE — ED Notes (Signed)
Patient is asking for more pain medication and rubbing her right side. I advised I would have to contact the Dr.

## 2020-11-11 NOTE — ED Notes (Signed)
Patient called out for assistance using the bathroom. Patient was placed on a bed pan, due to stating she was having pain on her side. Patient stated she was feeling anxious and appeared somewhat agitated that she could not receive more pain medication.

## 2020-11-11 NOTE — ED Notes (Signed)
[place] taken off to see how patient tolerates room air

## 2020-11-11 NOTE — Progress Notes (Signed)
Alerted by RN that pt has complaint regarding pain management.  Essie Hart RN has alerted Alvino Chapel MD who refuses to order additional pain medications.  Pt has an extensive history of substance abuse and overdose. Pt stating the ordered Ultram and Tordal is not relieving pain.  Pt offered warm compress to abd and provided. Leadership recommended prescribed Tylenol and turning off lights and closing blinds for relaxation. Pt declined. No other issues at this time.

## 2020-11-11 NOTE — ED Notes (Signed)
Patient moved to room 18. Called RT for Nebulizer.

## 2020-11-11 NOTE — ED Notes (Signed)
Walked past room, patient is asleep.

## 2020-11-11 NOTE — ED Notes (Addendum)
Patient states she is anxious and having pain that has not changed. Patient is states that nothing in working for her pain control. She also states she takes Xanax 2mg  TID. I advised the patient that tylenol was available and that I would message the Dr about her pain control. She also states she is having heartburn and needed her protonix.

## 2020-11-11 NOTE — ED Notes (Signed)
MD is aware of patients pain.

## 2020-11-11 NOTE — Progress Notes (Signed)
PROGRESS NOTE    Vicki Moore  FOY:[id number] DOB: 10/12/1980 DOA: 11/10/2020 PCP: [name], MD     Brief Narrative:  Vicki Moore is a 40 y.o. female with medical history significant for asthma, polysubstance abuse (opioids, alcohol, tobacco), GERD, chronic pain, COVID infection in January who presents with increasing worsening shortness of breath.   Patient reports that she was watching TV today when she had sudden onset of acute shortness of breath.  She also felt very hot and had to pour ice over her head.  States she has been having some coughing with productive sputum for the past few days.  Reports she has not an asthma attack for least 5 years.  She continues to smoke about 2 to 3 cigarettes/day. No vaping. States she has not felt well in the past year since she had her gallbladder removed. Has constant pain under her right rib and today they are acutely worse. Feels her abdominal hernias have been more prominent and she has been gaining weight. Has felt nausea and also has had chronic diarrhea since her cholecystectomy. Reports drinking "several long-islands and shots of whiskey" last night. Pt asking for pain medication for her RUQ pain.   Of note, pt recently admitted in April for reportedly unintentional opioid overdose requiring narcan and also had benzo withdrawal. She told pharmacy technician that she continues to take Percocet prescribed from a local pharmacy.    On arrival with EMS, she was reported to be hypoxic down to 84% on room air.  She was given Solu-Medrol and continuous neb prior to arrival.  In the ED she was placed on NRB and was given magnesium and unable to wean down to 2L.  New events last 24 hours / Subjective: Patient states that her breathing has improved, but continues to have severe right-sided chest pain overlying her ribs on the right side radiating to her back.  States that it is constant, sharp.  This has been present since her gallbladder  surgery last summer.  Assessment & Plan:   Principal Problem:   Acute respiratory failure with hypoxia (HCC) Active Problems:   Polysubstance abuse (HCC)   Elevated LFTs   Opioid abuse (HCC)   Acute hypoxic respiratory failure secondary to asthma exacerbation -On room air on my examination today -Continue Solu-Medrol and breathing treatments  Chronic right-sided chest/abdominal pain -Rib x-ray on right negative  -Right upper quadrant ultrasound showed prior cholecystectomy, hepatic steatosis, no acute finding   Elevated LFT -Has history of fatty liver and alcohol use -Improving   Alcohol abuse -CIWA protocol   History of opioid abuse -Patient noted to have had opioid overdose admission going back several years. She reported on admission that she is still being prescribed Percocet from a local pharmacy.  Pharmacy technician was able to call the pharmacy and notes that she has not been prescribed any Percocet in 2022. [place] substance database also does not show any recent narcotic Rx. Last one was in Feb for 3 days of Suboxone.   DVT prophylaxis:  enoxaparin (LOVENOX) injection 40 mg Start: 11/10/20 2200  Code Status:     Code Status Orders  (From admission, onward)           Start     Ordered   11/10/20 2117  Full code  Continuous        11/10/20 2116           Code Status History     Date Active Date Inactive Code  Status Order ID Comments User Context   08/03/2020 2212 08/06/2020 1953 Full Code [id number]  Laqueta Due, DO ED   11/07/2019 1910 11/15/2019 1832 Full Code [id number]  Jae Dire, MD ED      Family Communication: None at bedside Disposition Plan:  Status is: Inpatient  Remains inpatient appropriate because:Ongoing active pain requiring inpatient pain management and Inpatient level of care appropriate due to severity of illness  Dispo: The patient is from: Home              Anticipated d/c is to: Home              Patient currently is  not medically stable to d/c.   Difficult to place patient No      Consultants:  None  Procedures:  None   Antimicrobials:  Anti-infectives (From admission, onward)    None        Objective: Vitals:   11/11/20 1000 11/11/20 1142 11/11/20 1200 11/11/20 1230  BP: 132/86 (!) 151/94 (!) 143/95 (!) 149/93  Pulse: (!) 118 (!) 105 (!) 101 93  Resp: 14 10 12 12   Temp:   98 F (36.7 C)   TempSrc:      SpO2: 97% 93% 94% 94%  Weight:      Height:        Intake/Output Summary (Last 24 hours) at 11/11/2020 1311 Last data filed at 11/10/2020 1959 Gross per 24 hour  Intake 994.05 ml  Output --  Net 994.05 ml   Filed Weights   11/10/20 1840  Weight: 86.2 kg    Examination:  General exam: Appears uncomfortable, in pain  Respiratory system: Clear to auscultation. Respiratory effort normal. No respiratory distress. No conversational dyspnea. On room air  Cardiovascular system: S1 & S2 heard, RRR. No murmurs. No pedal edema. Gastrointestinal system: Abdomen is nondistended, soft and TTP RUQ  Central nervous system: Alert and oriented. No focal neurological deficits. Speech clear.  Extremities: Symmetric in appearance  Skin: No rashes, lesions or ulcers on exposed skin  Psychiatry: Judgement and insight appear stable   Data Reviewed: I have personally reviewed following labs and imaging studies  CBC: Recent Labs  Lab 11/10/20 1857  WBC 8.8  NEUTROABS 6.8  HGB 11.2*  HCT 36.9  MCV 85.4  PLT 316   Basic Metabolic Panel: Recent Labs  Lab 11/10/20 1857 11/11/20 0622  NA 140 134*  K 3.6 4.0  CL 105 101  CO2 21* 26  GLUCOSE 138* 140*  BUN 9 12  CREATININE 0.50 0.47  CALCIUM 8.5* 8.3*  MG 1.7  --    GFR: Estimated Creatinine Clearance: 94.2 mL/min (by C-G formula based on SCr of 0.47 mg/dL). Liver Function Tests: Recent Labs  Lab 11/10/20 1857 11/11/20 0622  AST 164* 88*  ALT 114* 96*  ALKPHOS 167* 138*  BILITOT 0.6 0.5  PROT 6.8 6.5  ALBUMIN 3.7 3.5    Recent Labs  Lab 11/10/20 1857  LIPASE 43   No results for input(s): AMMONIA in the last 168 hours. Coagulation Profile: No results for input(s): INR, PROTIME in the last 168 hours. Cardiac Enzymes: No results for input(s): CKTOTAL, CKMB, CKMBINDEX, TROPONINI in the last 168 hours. BNP (last 3 results) No results for input(s): PROBNP in the last 8760 hours. HbA1C: No results for input(s): HGBA1C in the last 72 hours. CBG: No results for input(s): GLUCAP in the last 168 hours. Lipid Profile: No results for input(s): CHOL, HDL, LDLCALC, TRIG, CHOLHDL, LDLDIRECT  in the last 72 hours. Thyroid Function Tests: No results for input(s): TSH, T4TOTAL, FREET4, T3FREE, THYROIDAB in the last 72 hours. Anemia Panel: No results for input(s): VITAMINB12, FOLATE, FERRITIN, TIBC, IRON, RETICCTPCT in the last 72 hours. Sepsis Labs: No results for input(s): PROCALCITON, LATICACIDVEN in the last 168 hours.  Recent Results (from the past 240 hour(s))  Resp Panel by RT-PCR (Flu A&B, Covid) Nasopharyngeal Swab     Status: None   Collection Time: 11/10/20  6:57 PM   Specimen: Nasopharyngeal Swab; Nasopharyngeal(NP) swabs in vial transport medium  Result Value Ref Range Status   SARS Coronavirus 2 by RT PCR NEGATIVE NEGATIVE Final    Comment: (NOTE) SARS-CoV-2 target nucleic acids are NOT DETECTED.  The SARS-CoV-2 RNA is generally detectable in upper respiratory specimens during the acute phase of infection. The lowest concentration of SARS-CoV-2 viral copies this assay can detect is 138 copies/mL. A negative result does not preclude SARS-Cov-2 infection and should not be used as the sole basis for treatment or other patient management decisions. A negative result may occur with  improper specimen collection/handling, submission of specimen other than nasopharyngeal swab, presence of viral mutation(s) within the areas targeted by this assay, and inadequate number of viral copies(<138  copies/mL). A negative result must be combined with clinical observations, patient history, and epidemiological information. The expected result is Negative.  Fact Sheet for Patients:  BloggerCourse.com  Fact Sheet for Healthcare Providers:  SeriousBroker.it  This test is no t yet approved or cleared by the [place] FDA and  has been authorized for detection and/or diagnosis of SARS-CoV-2 by FDA under an Emergency Use Authorization (EUA). This EUA will remain  in effect (meaning this test can be used) for the duration of the COVID-19 declaration under Section 564(b)(1) of the Act, 21 U.S.C.section 360bbb-3(b)(1), unless the authorization is terminated  or revoked sooner.       Influenza A by PCR NEGATIVE NEGATIVE Final   Influenza B by PCR NEGATIVE NEGATIVE Final    Comment: (NOTE) The Xpert Xpress SARS-CoV-2/FLU/RSV plus assay is intended as an aid in the diagnosis of influenza from Nasopharyngeal swab specimens and should not be used as a sole basis for treatment. Nasal washings and aspirates are unacceptable for Xpert Xpress SARS-CoV-2/FLU/RSV testing.  Fact Sheet for Patients: BloggerCourse.com  Fact Sheet for Healthcare Providers: SeriousBroker.it  This test is not yet approved or cleared by the [place] FDA and has been authorized for detection and/or diagnosis of SARS-CoV-2 by FDA under an Emergency Use Authorization (EUA). This EUA will remain in effect (meaning this test can be used) for the duration of the COVID-19 declaration under Section 564(b)(1) of the Act, 21 U.S.C. section 360bbb-3(b)(1), unless the authorization is terminated or revoked.  Performed at [facility], [address], [place], [place] [address]       Radiology Studies: DG Ribs Unilateral Right  Result Date: 11/11/2020 CLINICAL DATA:  Right rib pain EXAM: RIGHT  RIBS - 2 VIEW COMPARISON:  11/10/2020 FINDINGS: No fracture or other bone lesions are seen involving the ribs. IMPRESSION: Negative. Electronically Signed   By: Charlett Nose M.D.   On: 11/11/2020 09:58   DG Chest Port 1 View  Result Date: 11/10/2020 CLINICAL DATA:  Dyspnea. Respiratory distress today. History of asthma. Smoker. EXAM: PORTABLE CHEST 1 VIEW COMPARISON:  08/03/2020 FINDINGS: The heart size and mediastinal contours are within normal limits. Both lungs are clear. The visualized skeletal structures are unremarkable. IMPRESSION: No active disease.  Electronically Signed   By: Helyn NumbersAshesh  Parikh MD   On: 11/10/2020 20:12   US Abdomen Limited RUQ (LIVER/GB)  Result Date: 11/11/2020 CLINICAL DATA:  Chronic right upper quadrant pain EXAM: ULTRASOUND ABDOMEN LIMITED RIGHT UPPER QUADRANT COMPARISON:  04/23/2020 FINDINGS: Gallbladder: Prior cholecystectomy Common bile duct: Diameter: Mildly prominent measuring 8 mm, likely related to post cholecystectomy state. Liver: Increased echotexture compatible with fatty infiltration. No focal abnormality or biliary ductal dilatation. Portal vein is patent on color Doppler imaging with normal direction of blood flow towards the liver. Other: None. IMPRESSION: Prior cholecystectomy. Hepatic steatosis. No acute findings. Electronically Signed   By: Charlett NoseKevin  Dover M.D.   On: 11/11/2020 09:44      Scheduled Meds:  enoxaparin (LOVENOX) injection  40 mg Subcutaneous Q24H   levalbuterol  1.25 mg Nebulization Q6H   lidocaine  1 patch Transdermal Q24H   methylPREDNISolone (SOLU-MEDROL) injection  40 mg Intravenous Daily   Continuous Infusions:   LOS: 0 days      Time spent: 25 minutes   Noralee StainJennifer Ericah Scotto, DO [facility] 11/11/2020, 1:11 PM   Available via Epic secure chat 7am-7pm After these hours, please refer to coverage provider listed on [email or web address]

## 2020-11-11 NOTE — ED Notes (Signed)
[facility] Coverage, [place], NP contacted for pain medicine

## 2020-11-11 NOTE — ED Notes (Signed)
Patient requesting pain medication, offered tordol and patient refused. Patient states why cant I have morphine like I did yesterday. Explained to patient the MD doesn't have that ordered.

## 2020-11-11 NOTE — ED Notes (Signed)
Patient rates pain 10/10 and is falling asleep during conversation

## 2020-11-11 NOTE — Progress Notes (Signed)
   11/11/20 1406  Assess: MEWS Score  BP (!) 140/91  Pulse Rate (!) 113  Resp 20  SpO2 97 %  O2 Device Room Air  Assess: MEWS Score  MEWS Temp 0  MEWS Systolic 0  MEWS Pulse 2  MEWS RR 0  MEWS LOC 0  MEWS Score 2  MEWS Score Color Yellow  Assess: if the MEWS score is Yellow or Red  Were vital signs taken at a resting state? Yes  Focused Assessment No change from prior assessment  Does the patient meet 2 or more of the SIRS criteria? No  MEWS guidelines implemented *See Row Information* Yes  Treat  Pain Scale 0-10  Pain Score 10  Pain Type Acute pain  Pain Location Abdomen  Pain Orientation Right;Upper  Pain Descriptors / Indicators Sharp  Pain Frequency Constant  Pain Onset On-going  Patients Stated Pain Goal 9  Pain Intervention(s) Relaxation;Emotional support;Medication (See eMAR)  Multiple Pain Sites No  Take Vital Signs  Increase Vital Sign Frequency  Yellow: Q 2hr X 2 then Q 4hr X 2, if remains yellow, continue Q 4hrs  Escalate  MEWS: Escalate Yellow: discuss with charge nurse/RN and consider discussing with provider and RRT  Notify: Charge Nurse/RN  Name of Charge Nurse/RN Notified Kasia, RN  Document  Patient Outcome Stabilized after interventions  Progress note created (see row info) Yes  Assess: SIRS CRITERIA  SIRS Temperature  0  SIRS Pulse 1  SIRS Respirations  0  SIRS WBC 0  SIRS Score Sum  1

## 2020-11-11 NOTE — ED Notes (Signed)
Patient is back on 2L [place]. Patient o2 drops between 88-90 when asleep on room air. Patient also requesting something for heartburn.

## 2020-11-11 NOTE — ED Notes (Signed)
Walked into room, patient was asleep. Neb Complete.

## 2020-11-12 DIAGNOSIS — J9601 Acute respiratory failure with hypoxia: Secondary | ICD-10-CM | POA: Diagnosis not present

## 2020-11-12 LAB — COMPREHENSIVE METABOLIC PANEL
ALT: 62 U/L — ABNORMAL HIGH (ref 0–44)
AST: 38 U/L (ref 15–41)
Albumin: 3.2 g/dL — ABNORMAL LOW (ref 3.5–5.0)
Alkaline Phosphatase: 119 U/L (ref 38–126)
Anion gap: 6 (ref 5–15)
BUN: 20 mg/dL (ref 6–20)
CO2: 23 mmol/L (ref 22–32)
Calcium: 8.6 mg/dL — ABNORMAL LOW (ref 8.9–10.3)
Chloride: 109 mmol/L (ref 98–111)
Creatinine, Ser: 0.57 mg/dL (ref 0.44–1.00)
GFR, Estimated: >60 mL/min (ref >60)
Glucose, Bld: 90 mg/dL (ref 70–99)
Potassium: 4 mmol/L (ref 3.5–5.1)
Sodium: 138 mmol/L (ref 135–145)
Total Bilirubin: 0.4 mg/dL (ref 0.3–1.2)
Total Protein: 6 g/dL — ABNORMAL LOW (ref 6.5–8.1)

## 2020-11-12 MED ORDER — FLUTICASONE-SALMETEROL 500-50 MCG/ACT IN AEPB: 1.0000 | INHALATION_SPRAY | Freq: Two times a day (BID) | RESPIRATORY_TRACT | 3 refills | Status: DC

## 2020-11-12 MED ORDER — PREDNISONE 20 MG PO TABS: 40.0000 mg | ORAL_TABLET | Freq: Every day | ORAL | 0 refills | Status: AC

## 2020-11-12 NOTE — Discharge Summary (Addendum)
Physician Discharge Summary  SIMRAN BOMKAMP QIO:[id number] DOB: Aug 16, 1980 DOA: 11/10/2020  PCP: [name], MD  Admit date: 11/10/2020 Discharge date: 11/12/2020  Admitted From: home Disposition:  home  Recommendations for Outpatient Follow-up:  Follow up with PCP in 1 week  Discharge Condition: Stable CODE STATUS: Full  Diet recommendation: Regular diet   Brief/Interim Summary: Vicki Moore is a 40 y.o. female with medical history significant for asthma, polysubstance abuse (opioids, alcohol, tobacco), GERD, chronic pain, COVID infection in January who presents with increasing worsening shortness of breath.   Patient reports that she was watching TV today when she had sudden onset of acute shortness of breath.  She also felt very hot and had to pour ice over her head.  States she has been having some coughing with productive sputum for the past few days.  Reports she has not an asthma attack for least 5 years.  She continues to smoke about 2 to 3 cigarettes/day. No vaping. States she has not felt well in the past year since she had her gallbladder removed. Has constant pain under her right rib and today they are acutely worse. Feels her abdominal hernias have been more prominent and she has been gaining weight. Has felt nausea and also has had chronic diarrhea since her cholecystectomy. Reports drinking "several long-islands and shots of whiskey" last night. Pt asking for pain medication for her RUQ pain.   Of note, pt recently admitted in April for reportedly unintentional opioid overdose requiring narcan and also had benzo withdrawal. She told pharmacy technician that she continues to take Percocet prescribed from a local pharmacy.   On arrival with EMS, she was reported to be hypoxic down to 84% on room air.  She was given Solu-Medrol and continuous neb prior to arrival.  In the ED she was placed on NRB and was given magnesium and unable to wean down to 2L.  Patient's  breathing continued to improve. Work up for chronic right sided pain have been negative. Medication options limited due to patient's history of opioid, benzo abuse and overdose.   Discharge Diagnoses:  Principal Problem:   Acute respiratory failure with hypoxia (HCC) Active Problems:   Polysubstance abuse (HCC)   Elevated LFTs   Opioid abuse (HCC)   Acute hypoxic respiratory failure secondary to asthma exacerbation -On room air now -Solumedrol --> Prednisone on discharge   Chronic right-sided chest/abdominal pain -Rib x-ray on right negative  -Right upper quadrant ultrasound showed prior cholecystectomy, hepatic steatosis, no acute finding -Follow up with PCP    Elevated LFT -Has history of fatty liver and alcohol use -Improving   Alcohol abuse -CIWA protocol    History of opioid abuse -Patient noted to have had opioid overdose admission going back several years. She reported on admission that she is still being prescribed Percocet from a local pharmacy.  Pharmacy technician was able to call the pharmacy and notes that she has not been prescribed any Percocet in 2022. [place] substance database also does not show any recent narcotic Rx. Last one was in Feb for 3 days of Suboxone.   Discharge Instructions  Discharge Instructions     Call MD for:  difficulty breathing, headache or visual disturbances   Complete by: As directed    Call MD for:  extreme fatigue   Complete by: As directed    Call MD for:  persistant dizziness or light-headedness   Complete by: As directed    Call MD for:  persistant nausea and  vomiting   Complete by: As directed    Call MD for:  severe uncontrolled pain   Complete by: As directed    Call MD for:  temperature >100.4   Complete by: As directed    Discharge instructions   Complete by: As directed    You were cared for by a hospitalist during your hospital stay. If you have any questions about your discharge medications or the care you received  while you were in the hospital after you are discharged, you can call the unit and ask to speak with the hospitalist on call if the hospitalist that took care of you is not available. Once you are discharged, your primary care physician will handle any further medical issues. Please note that NO REFILLS for any discharge medications will be authorized once you are discharged, as it is imperative that you return to your primary care physician (or establish a relationship with a primary care physician if you do not have one) for your aftercare needs so that they can reassess your need for medications and monitor your lab values.   Increase activity slowly   Complete by: As directed       Allergies as of 11/12/2020       Reactions   Peanut-containing Drug Products Hives, Shortness Of Breath   Penicillins Anaphylaxis, Hives, Nausea And Vomiting, Rash           Medication List     STOP taking these medications    alprazolam 2 MG tablet Commonly known as: XANAX   cyclobenzaprine 5 MG tablet Commonly known as: FLEXERIL   dicyclomine 20 MG tablet Commonly known as: BENTYL   DULoxetine 20 MG capsule Commonly known as: CYMBALTA   hydrOXYzine 25 MG tablet Commonly known as: ATARAX/VISTARIL   methylphenidate 10 MG tablet Commonly known as: RITALIN   methylphenidate 20 MG tablet Commonly known as: RITALIN   oxyCODONE-acetaminophen 10-325 MG tablet Commonly known as: PERCOCET   pregabalin 300 MG capsule Commonly known as: LYRICA   promethazine 25 MG suppository Commonly known as: PHENERGAN   promethazine 25 MG tablet Commonly known as: PHENERGAN   senna-docusate 8.6-50 MG tablet Commonly known as: Senokot-S       TAKE these medications    albuterol 108 (90 Base) MCG/ACT inhaler Commonly known as: VENTOLIN HFA Inhale 2 puffs into the lungs every 6 (six) hours as needed for wheezing or shortness of breath. For shortness of breath   fluticasone-salmeterol 500-50 MCG/ACT  Aepb Commonly known as: Advair Diskus Inhale 1 puff into the lungs 2 (two) times daily. What changed: medication strength   ipratropium-albuterol 0.5-2.5 (3) MG/3ML Soln Commonly known as: DUONEB Take 3 mLs by nebulization 3 (three) times daily as needed for wheezing or shortness of breath.   nicotine 14 mg/24hr patch Commonly known as: NICODERM CQ - dosed in mg/24 hours Place 1 patch (14 mg total) onto the skin daily. What changed:  when to take this reasons to take this   pantoprazole 40 MG tablet Commonly known as: PROTONIX Take 1 tablet (40 mg total) by mouth 2 (two) times daily before a meal. What changed:  when to take this reasons to take this   predniSONE 20 MG tablet Commonly known as: DELTASONE Take 2 tablets (40 mg total) by mouth daily for 3 days.   tetrahydrozoline-zinc 0.05-0.25 % ophthalmic solution Commonly known as: VISINE-AC Place 1 drop into both eyes 3 (three) times daily as needed (dry eyes).   TUMS PO Take 1-2  tablets by mouth every hour as needed (acid reflux/heartburn).   valACYclovir 1000 MG tablet Commonly known as: VALTREX Take 1,000 mg by mouth 2 (two) times daily as needed (breakouts).        Follow-up Information     Hoy RegisterNewlin, Enobong, MD. Schedule an appointment as soon as possible for a visit in 1 week(s).   Specialty: Family Medicine Contact information: [address] [place] [id number] [phone]                Allergies  Allergen Reactions   Peanut-Containing Drug Products Hives and Shortness Of Breath   Penicillins Anaphylaxis, Hives, Nausea And Vomiting and Rash          Consultations: None    Procedures/Studies: DG Ribs Unilateral Right  Result Date: 11/11/2020 CLINICAL DATA:  Right rib pain EXAM: RIGHT RIBS - 2 VIEW COMPARISON:  11/10/2020 FINDINGS: No fracture or other bone lesions are seen involving the ribs. IMPRESSION: Negative. Electronically Signed   By: Charlett NoseKevin  Dover M.D.   On: 11/11/2020  09:58   DG Chest Port 1 View  Result Date: 11/10/2020 CLINICAL DATA:  Dyspnea. Respiratory distress today. History of asthma. Smoker. EXAM: PORTABLE CHEST 1 VIEW COMPARISON:  08/03/2020 FINDINGS: The heart size and mediastinal contours are within normal limits. Both lungs are clear. The visualized skeletal structures are unremarkable. IMPRESSION: No active disease. Electronically Signed   By: Helyn NumbersAshesh  Parikh MD   On: 11/10/2020 20:12   US Abdomen Limited RUQ (LIVER/GB)  Result Date: 11/11/2020 CLINICAL DATA:  Chronic right upper quadrant pain EXAM: ULTRASOUND ABDOMEN LIMITED RIGHT UPPER QUADRANT COMPARISON:  04/23/2020 FINDINGS: Gallbladder: Prior cholecystectomy Common bile duct: Diameter: Mildly prominent measuring 8 mm, likely related to post cholecystectomy state. Liver: Increased echotexture compatible with fatty infiltration. No focal abnormality or biliary ductal dilatation. Portal vein is patent on color Doppler imaging with normal direction of blood flow towards the liver. Other: None. IMPRESSION: Prior cholecystectomy. Hepatic steatosis. No acute findings. Electronically Signed   By: Charlett NoseKevin  Dover M.D.   On: 11/11/2020 09:44       Discharge Exam: Vitals:   11/12/20 0408 11/12/20 0841  BP: 116/76   Pulse: (!) 104   Resp: 20   Temp: 98 F (36.7 C)   SpO2: 97% 98%    General: Pt is alert, awake, not in acute distress, comfortably sleeping on exam  Cardiovascular: RRR, S1/S2 +, no edema Respiratory: CTA bilaterally, no wheezing, no rhonchi, no respiratory distress, no conversational dyspnea, on room air  Abdominal: Soft, NT, ND, bowel sounds + Extremities: no edema, no cyanosis Psych: Normal mood and affect, stable judgement and insight     The results of significant diagnostics from this hospitalization (including imaging, microbiology, ancillary and laboratory) are listed below for reference.     Microbiology: Recent Results (from the past 240 hour(s))  Resp Panel by RT-PCR  (Flu A&B, Covid) Nasopharyngeal Swab     Status: None   Collection Time: 11/10/20  6:57 PM   Specimen: Nasopharyngeal Swab; Nasopharyngeal(NP) swabs in vial transport medium  Result Value Ref Range Status   SARS Coronavirus 2 by RT PCR NEGATIVE NEGATIVE Final    Comment: (NOTE) SARS-CoV-2 target nucleic acids are NOT DETECTED.  The SARS-CoV-2 RNA is generally detectable in upper respiratory specimens during the acute phase of infection. The lowest concentration of SARS-CoV-2 viral copies this assay can detect is 138 copies/mL. A negative result does not preclude SARS-Cov-2 infection and should not be used as the sole  basis for treatment or other patient management decisions. A negative result may occur with  improper specimen collection/handling, submission of specimen other than nasopharyngeal swab, presence of viral mutation(s) within the areas targeted by this assay, and inadequate number of viral copies(<138 copies/mL). A negative result must be combined with clinical observations, patient history, and epidemiological information. The expected result is Negative.  Fact Sheet for Patients:  BloggerCourse.com  Fact Sheet for Healthcare Providers:  SeriousBroker.it  This test is no t yet approved or cleared by the [place] FDA and  has been authorized for detection and/or diagnosis of SARS-CoV-2 by FDA under an Emergency Use Authorization (EUA). This EUA will remain  in effect (meaning this test can be used) for the duration of the COVID-19 declaration under Section 564(b)(1) of the Act, 21 U.S.C.section 360bbb-3(b)(1), unless the authorization is terminated  or revoked sooner.       Influenza A by PCR NEGATIVE NEGATIVE Final   Influenza B by PCR NEGATIVE NEGATIVE Final    Comment: (NOTE) The Xpert Xpress SARS-CoV-2/FLU/RSV plus assay is intended as an aid in the diagnosis of influenza from Nasopharyngeal swab specimens  and should not be used as a sole basis for treatment. Nasal washings and aspirates are unacceptable for Xpert Xpress SARS-CoV-2/FLU/RSV testing.  Fact Sheet for Patients: BloggerCourse.com  Fact Sheet for Healthcare Providers: SeriousBroker.it  This test is not yet approved or cleared by the [place] FDA and has been authorized for detection and/or diagnosis of SARS-CoV-2 by FDA under an Emergency Use Authorization (EUA). This EUA will remain in effect (meaning this test can be used) for the duration of the COVID-19 declaration under Section 564(b)(1) of the Act, 21 U.S.C. section 360bbb-3(b)(1), unless the authorization is terminated or revoked.  Performed at [facility], [address], [place], [place] [address]      Labs: BNP (last 3 results) Recent Labs    04/16/20 1006  BNP 77.5   Basic Metabolic Panel: Recent Labs  Lab 11/10/20 1857 11/11/20 0622 11/12/20 0441  NA 140 134* 138  K 3.6 4.0 4.0  CL 105 101 109  CO2 21* 26 23  GLUCOSE 138* 140* 90  BUN 9 12 20   CREATININE 0.50 0.47 0.57  CALCIUM 8.5* 8.3* 8.6*  MG 1.7  --   --    Liver Function Tests: Recent Labs  Lab 11/10/20 1857 11/11/20 0622 11/12/20 0441  AST 164* 88* 38  ALT 114* 96* 62*  ALKPHOS 167* 138* 119  BILITOT 0.6 0.5 0.4  PROT 6.8 6.5 6.0*  ALBUMIN 3.7 3.5 3.2*   Recent Labs  Lab 11/10/20 1857  LIPASE 43   No results for input(s): AMMONIA in the last 168 hours. CBC: Recent Labs  Lab 11/10/20 1857  WBC 8.8  NEUTROABS 6.8  HGB 11.2*  HCT 36.9  MCV 85.4  PLT 316   Cardiac Enzymes: No results for input(s): CKTOTAL, CKMB, CKMBINDEX, TROPONINI in the last 168 hours. BNP: Invalid input(s): POCBNP CBG: No results for input(s): GLUCAP in the last 168 hours. D-Dimer No results for input(s): DDIMER in the last 72 hours. Hgb A1c No results for input(s): HGBA1C in the last 72 hours. Lipid Profile No  results for input(s): CHOL, HDL, LDLCALC, TRIG, CHOLHDL, LDLDIRECT in the last 72 hours. Thyroid function studies No results for input(s): TSH, T4TOTAL, T3FREE, THYROIDAB in the last 72 hours.  Invalid input(s): FREET3 Anemia work up No results for input(s): VITAMINB12, FOLATE, FERRITIN, TIBC, IRON, RETICCTPCT in the last  72 hours. Urinalysis    Component Value Date/Time   COLORURINE STRAW (A) 01/24/2020 0205   APPEARANCEUR CLEAR 01/24/2020 0205   APPEARANCEUR Clear 01/10/2020 1349   LABSPEC 1.010 01/24/2020 0205   PHURINE 7.0 01/24/2020 0205   GLUCOSEU NEGATIVE 01/24/2020 0205   HGBUR NEGATIVE 01/24/2020 0205   BILIRUBINUR NEGATIVE 01/24/2020 0205   BILIRUBINUR Negative 01/10/2020 1349   KETONESUR NEGATIVE 01/24/2020 0205   PROTEINUR NEGATIVE 01/24/2020 0205   UROBILINOGEN 0.2 08/15/2010 2057   NITRITE NEGATIVE 01/24/2020 0205   LEUKOCYTESUR NEGATIVE 01/24/2020 0205   Sepsis Labs Invalid input(s): PROCALCITONIN,  WBC,  LACTICIDVEN Microbiology Recent Results (from the past 240 hour(s))  Resp Panel by RT-PCR (Flu A&B, Covid) Nasopharyngeal Swab     Status: None   Collection Time: 11/10/20  6:57 PM   Specimen: Nasopharyngeal Swab; Nasopharyngeal(NP) swabs in vial transport medium  Result Value Ref Range Status   SARS Coronavirus 2 by RT PCR NEGATIVE NEGATIVE Final    Comment: (NOTE) SARS-CoV-2 target nucleic acids are NOT DETECTED.  The SARS-CoV-2 RNA is generally detectable in upper respiratory specimens during the acute phase of infection. The lowest concentration of SARS-CoV-2 viral copies this assay can detect is 138 copies/mL. A negative result does not preclude SARS-Cov-2 infection and should not be used as the sole basis for treatment or other patient management decisions. A negative result may occur with  improper specimen collection/handling, submission of specimen other than nasopharyngeal swab, presence of viral mutation(s) within the areas targeted by this  assay, and inadequate number of viral copies(<138 copies/mL). A negative result must be combined with clinical observations, patient history, and epidemiological information. The expected result is Negative.  Fact Sheet for Patients:  BloggerCourse.com  Fact Sheet for Healthcare Providers:  SeriousBroker.it  This test is no t yet approved or cleared by the [place] FDA and  has been authorized for detection and/or diagnosis of SARS-CoV-2 by FDA under an Emergency Use Authorization (EUA). This EUA will remain  in effect (meaning this test can be used) for the duration of the COVID-19 declaration under Section 564(b)(1) of the Act, 21 U.S.C.section 360bbb-3(b)(1), unless the authorization is terminated  or revoked sooner.       Influenza A by PCR NEGATIVE NEGATIVE Final   Influenza B by PCR NEGATIVE NEGATIVE Final    Comment: (NOTE) The Xpert Xpress SARS-CoV-2/FLU/RSV plus assay is intended as an aid in the diagnosis of influenza from Nasopharyngeal swab specimens and should not be used as a sole basis for treatment. Nasal washings and aspirates are unacceptable for Xpert Xpress SARS-CoV-2/FLU/RSV testing.  Fact Sheet for Patients: BloggerCourse.com  Fact Sheet for Healthcare Providers: SeriousBroker.it  This test is not yet approved or cleared by the [place] FDA and has been authorized for detection and/or diagnosis of SARS-CoV-2 by FDA under an Emergency Use Authorization (EUA). This EUA will remain in effect (meaning this test can be used) for the duration of the COVID-19 declaration under Section 564(b)(1) of the Act, 21 U.S.C. section 360bbb-3(b)(1), unless the authorization is terminated or revoked.  Performed at [facility], [address], [place], [place] [address]      Patient was seen and examined on the day of discharge and was  found to be in stable condition. Time coordinating discharge: 25 minutes including assessment and coordination of care, as well as examination of the patient.   SIGNED:  Noralee Stain, DO [facility] 11/12/2020, 10:04 AM

## 2020-11-12 NOTE — Progress Notes (Signed)
Pt asked for stronger pain medicine throughout the night. Educated pt on why stronger pain medication would not be ordered. Offered pts PRN tramadol. Pt refused and requested I ask the dr. for stronger meds. Johann Capers NP notified. No new orders given. Educated pt. PRN tramadol and ativan given.

## 2020-11-13 ENCOUNTER — Telehealth: Payer: Self-pay

## 2020-11-13 NOTE — Telephone Encounter (Signed)
Transition Care Management Unsuccessful Follow-up Telephone Call  Date of discharge and from where:  11/12/2020, [facility]   Attempts:  1st Attempt  Reason for unsuccessful TCM follow-up call:  Left voice message on # [phone].  Need to discuss scheduling a hospital follow up appointment

## 2020-11-14 ENCOUNTER — Telehealth: Payer: Self-pay

## 2020-11-14 NOTE — Telephone Encounter (Signed)
Transition Care Management Unsuccessful Follow-up Telephone Call  Date of discharge and from where:  11/12/2020, [facility]   Attempts:  2nd Attempt  Reason for unsuccessful TCM follow-up call:  Left voice message on # [phone].   Need to discuss scheduling a follow up appointment

## 2020-11-15 ENCOUNTER — Telehealth: Payer: Self-pay

## 2020-11-15 NOTE — Telephone Encounter (Signed)
Transition Care Management Unsuccessful Follow-up Telephone Call   Date of discharge and from where:  11/12/2020, [facility]    Attempts:  3rd Attempt   Reason for unsuccessful TCM follow-up call:  unable to reach pt at this time, call does not go trough # [phone].     Need to discuss scheduling a follow up appointment

## 2020-11-18 ENCOUNTER — Telehealth: Payer: Self-pay

## 2020-11-18 NOTE — Telephone Encounter (Signed)
Letter sent to patient requesting she contact [facility] to schedule a hospital follow up appointment

## 2020-11-19 ENCOUNTER — Emergency Department (HOSPITAL_COMMUNITY): Payer: Medicaid Other

## 2020-11-19 ENCOUNTER — Inpatient Hospital Stay (HOSPITAL_COMMUNITY)
Admission: EM | Admit: 2020-11-19 | Discharge: 2020-11-28 | DRG: 202 | Disposition: A | Discharge status: Home / Self Care | Payer: Medicaid Other | Attending: Internal Medicine | Admitting: Internal Medicine

## 2020-11-19 ENCOUNTER — Other Ambulatory Visit: Payer: Self-pay

## 2020-11-19 ENCOUNTER — Encounter (HOSPITAL_COMMUNITY): Payer: Self-pay | Admitting: Emergency Medicine

## 2020-11-19 ENCOUNTER — Emergency Department (HOSPITAL_COMMUNITY)
Admission: EM | Admit: 2020-11-19 | Discharge: 2020-11-19 | Disposition: A | Discharge status: Home / Self Care | Payer: Medicaid Other | Source: Home / Self Care | Attending: Emergency Medicine | Admitting: Emergency Medicine

## 2020-11-19 ENCOUNTER — Encounter (HOSPITAL_COMMUNITY): Payer: Self-pay | Admitting: Pharmacy Technician

## 2020-11-19 DIAGNOSIS — J4541 Moderate persistent asthma with (acute) exacerbation: Principal | ICD-10-CM | POA: Diagnosis present

## 2020-11-19 DIAGNOSIS — R0603 Acute respiratory distress: Secondary | ICD-10-CM

## 2020-11-19 DIAGNOSIS — K2100 Gastro-esophageal reflux disease with esophagitis, without bleeding: Secondary | ICD-10-CM | POA: Diagnosis present

## 2020-11-19 DIAGNOSIS — R0602 Shortness of breath: Secondary | ICD-10-CM | POA: Insufficient documentation

## 2020-11-19 DIAGNOSIS — E669 Obesity, unspecified: Secondary | ICD-10-CM | POA: Diagnosis present

## 2020-11-19 DIAGNOSIS — R1013 Epigastric pain: Secondary | ICD-10-CM | POA: Diagnosis not present

## 2020-11-19 DIAGNOSIS — J45901 Unspecified asthma with (acute) exacerbation: Secondary | ICD-10-CM | POA: Diagnosis present

## 2020-11-19 DIAGNOSIS — Z9049 Acquired absence of other specified parts of digestive tract: Secondary | ICD-10-CM | POA: Insufficient documentation

## 2020-11-19 DIAGNOSIS — R109 Unspecified abdominal pain: Secondary | ICD-10-CM

## 2020-11-19 DIAGNOSIS — F17210 Nicotine dependence, cigarettes, uncomplicated: Secondary | ICD-10-CM | POA: Insufficient documentation

## 2020-11-19 DIAGNOSIS — Z20822 Contact with and (suspected) exposure to covid-19: Secondary | ICD-10-CM | POA: Diagnosis present

## 2020-11-19 DIAGNOSIS — G8929 Other chronic pain: Secondary | ICD-10-CM | POA: Diagnosis present

## 2020-11-19 DIAGNOSIS — R1084 Generalized abdominal pain: Secondary | ICD-10-CM | POA: Diagnosis not present

## 2020-11-19 DIAGNOSIS — Z7951 Long term (current) use of inhaled steroids: Secondary | ICD-10-CM

## 2020-11-19 DIAGNOSIS — T486X6D Underdosing of antiasthmatics, subsequent encounter: Secondary | ICD-10-CM

## 2020-11-19 DIAGNOSIS — J9601 Acute respiratory failure with hypoxia: Secondary | ICD-10-CM | POA: Diagnosis present

## 2020-11-19 DIAGNOSIS — Z880 Allergy status to penicillin: Secondary | ICD-10-CM

## 2020-11-19 DIAGNOSIS — Z79899 Other long term (current) drug therapy: Secondary | ICD-10-CM

## 2020-11-19 DIAGNOSIS — F1010 Alcohol abuse, uncomplicated: Secondary | ICD-10-CM | POA: Diagnosis present

## 2020-11-19 DIAGNOSIS — F32A Depression, unspecified: Secondary | ICD-10-CM | POA: Diagnosis present

## 2020-11-19 DIAGNOSIS — I10 Essential (primary) hypertension: Secondary | ICD-10-CM | POA: Diagnosis not present

## 2020-11-19 DIAGNOSIS — Z6837 Body mass index (BMI) 37.0-37.9, adult: Secondary | ICD-10-CM

## 2020-11-19 DIAGNOSIS — F1120 Opioid dependence, uncomplicated: Secondary | ICD-10-CM | POA: Diagnosis present

## 2020-11-19 DIAGNOSIS — Z7952 Long term (current) use of systemic steroids: Secondary | ICD-10-CM | POA: Insufficient documentation

## 2020-11-19 DIAGNOSIS — K3184 Gastroparesis: Secondary | ICD-10-CM | POA: Diagnosis present

## 2020-11-19 DIAGNOSIS — E876 Hypokalemia: Secondary | ICD-10-CM | POA: Diagnosis present

## 2020-11-19 DIAGNOSIS — R0789 Other chest pain: Secondary | ICD-10-CM | POA: Diagnosis not present

## 2020-11-19 DIAGNOSIS — R112 Nausea with vomiting, unspecified: Secondary | ICD-10-CM | POA: Diagnosis present

## 2020-11-19 DIAGNOSIS — R000 Tachycardia, unspecified: Secondary | ICD-10-CM | POA: Insufficient documentation

## 2020-11-19 DIAGNOSIS — E872 Acidosis, unspecified: Secondary | ICD-10-CM

## 2020-11-19 DIAGNOSIS — I456 Pre-excitation syndrome: Secondary | ICD-10-CM

## 2020-11-19 DIAGNOSIS — K219 Gastro-esophageal reflux disease without esophagitis: Secondary | ICD-10-CM | POA: Insufficient documentation

## 2020-11-19 DIAGNOSIS — J45909 Unspecified asthma, uncomplicated: Secondary | ICD-10-CM | POA: Insufficient documentation

## 2020-11-19 DIAGNOSIS — N920 Excessive and frequent menstruation with regular cycle: Secondary | ICD-10-CM | POA: Diagnosis present

## 2020-11-19 DIAGNOSIS — K589 Irritable bowel syndrome without diarrhea: Secondary | ICD-10-CM | POA: Diagnosis present

## 2020-11-19 DIAGNOSIS — Z91138 Patient's unintentional underdosing of medication regimen for other reason: Secondary | ICD-10-CM

## 2020-11-19 DIAGNOSIS — R197 Diarrhea, unspecified: Secondary | ICD-10-CM | POA: Insufficient documentation

## 2020-11-19 DIAGNOSIS — K449 Diaphragmatic hernia without obstruction or gangrene: Secondary | ICD-10-CM

## 2020-11-19 DIAGNOSIS — R079 Chest pain, unspecified: Secondary | ICD-10-CM | POA: Diagnosis not present

## 2020-11-19 DIAGNOSIS — Z91010 Allergy to peanuts: Secondary | ICD-10-CM | POA: Insufficient documentation

## 2020-11-19 DIAGNOSIS — Z803 Family history of malignant neoplasm of breast: Secondary | ICD-10-CM

## 2020-11-19 DIAGNOSIS — K429 Umbilical hernia without obstruction or gangrene: Secondary | ICD-10-CM

## 2020-11-19 DIAGNOSIS — R1110 Vomiting, unspecified: Secondary | ICD-10-CM

## 2020-11-19 DIAGNOSIS — D649 Anemia, unspecified: Secondary | ICD-10-CM | POA: Diagnosis present

## 2020-11-19 DIAGNOSIS — I471 Supraventricular tachycardia, unspecified: Secondary | ICD-10-CM

## 2020-11-19 DIAGNOSIS — N9489 Other specified conditions associated with female genital organs and menstrual cycle: Secondary | ICD-10-CM | POA: Insufficient documentation

## 2020-11-19 DIAGNOSIS — F419 Anxiety disorder, unspecified: Secondary | ICD-10-CM | POA: Diagnosis present

## 2020-11-19 DIAGNOSIS — R944 Abnormal results of kidney function studies: Secondary | ICD-10-CM | POA: Diagnosis present

## 2020-11-19 DIAGNOSIS — D72828 Other elevated white blood cell count: Secondary | ICD-10-CM | POA: Diagnosis present

## 2020-11-19 DIAGNOSIS — N179 Acute kidney failure, unspecified: Secondary | ICD-10-CM

## 2020-11-19 DIAGNOSIS — R42 Dizziness and giddiness: Secondary | ICD-10-CM | POA: Diagnosis present

## 2020-11-19 LAB — CBC WITH DIFFERENTIAL/PLATELET
Abs Immature Granulocytes: 0.18 10*3/uL — ABNORMAL HIGH (ref 0.00–0.07)
Basophils Absolute: 0 10*3/uL (ref 0.0–0.1)
Basophils Relative: 0 %
Eosinophils Absolute: 0 10*3/uL (ref 0.0–0.5)
Eosinophils Relative: 0 %
HCT: 37.3 % (ref 36.0–46.0)
Hemoglobin: 11 g/dL — ABNORMAL LOW (ref 12.0–15.0)
Immature Granulocytes: 2 %
Lymphocytes Relative: 14 %
Lymphs Abs: 1.7 10*3/uL (ref 0.7–4.0)
MCH: 25.5 pg — ABNORMAL LOW (ref 26.0–34.0)
MCHC: 29.5 g/dL — ABNORMAL LOW (ref 30.0–36.0)
MCV: 86.5 fL (ref 80.0–100.0)
Monocytes Absolute: 0.4 10*3/uL (ref 0.1–1.0)
Monocytes Relative: 3 %
Neutro Abs: 9.7 10*3/uL — ABNORMAL HIGH (ref 1.7–7.7)
Neutrophils Relative %: 81 %
Platelets: 439 10*3/uL — ABNORMAL HIGH (ref 150–400)
RBC: 4.31 MIL/uL (ref 3.87–5.11)
RDW: 20 % — ABNORMAL HIGH (ref 11.5–15.5)
WBC: 11.9 10*3/uL — ABNORMAL HIGH (ref 4.0–10.5)
nRBC: 0 % (ref 0.0–0.2)

## 2020-11-19 LAB — COMPREHENSIVE METABOLIC PANEL
ALT: 83 U/L — ABNORMAL HIGH (ref 0–44)
ALT: 84 U/L — ABNORMAL HIGH (ref 0–44)
AST: 81 U/L — ABNORMAL HIGH (ref 15–41)
AST: 85 U/L — ABNORMAL HIGH (ref 15–41)
Albumin: 3.5 g/dL (ref 3.5–5.0)
Albumin: 3.9 g/dL (ref 3.5–5.0)
Alkaline Phosphatase: 120 U/L (ref 38–126)
Alkaline Phosphatase: 128 U/L — ABNORMAL HIGH (ref 38–126)
Anion gap: 12 (ref 5–15)
Anion gap: 27 — ABNORMAL HIGH (ref 5–15)
BUN: 9 mg/dL (ref 6–20)
BUN: 7 mg/dL (ref 6–20)
CO2: 26 mmol/L (ref 22–32)
CO2: 9 mmol/L — ABNORMAL LOW (ref 22–32)
Calcium: 9.8 mg/dL (ref 8.9–10.3)
Calcium: 10 mg/dL (ref 8.9–10.3)
Chloride: 102 mmol/L (ref 98–111)
Chloride: 102 mmol/L (ref 98–111)
Creatinine, Ser: 0.64 mg/dL (ref 0.44–1.00)
Creatinine, Ser: 1.09 mg/dL — ABNORMAL HIGH (ref 0.44–1.00)
GFR, Estimated: >60 mL/min (ref >60)
GFR, Estimated: >60 mL/min (ref >60)
Glucose, Bld: 101 mg/dL — ABNORMAL HIGH (ref 70–99)
Glucose, Bld: 163 mg/dL — ABNORMAL HIGH (ref 70–99)
Potassium: 3.3 mmol/L — ABNORMAL LOW (ref 3.5–5.1)
Potassium: 3.4 mmol/L — ABNORMAL LOW (ref 3.5–5.1)
Sodium: 140 mmol/L (ref 135–145)
Sodium: 138 mmol/L (ref 135–145)
Total Bilirubin: 0.8 mg/dL (ref 0.3–1.2)
Total Bilirubin: 0.6 mg/dL (ref 0.3–1.2)
Total Protein: 7 g/dL (ref 6.5–8.1)
Total Protein: 7.8 g/dL (ref 6.5–8.1)

## 2020-11-19 LAB — I-STAT VENOUS BLOOD GAS, ED
Acid-base deficit: 11 mmol/L — ABNORMAL HIGH (ref 0.0–2.0)
Bicarbonate: 13.1 mmol/L — ABNORMAL LOW (ref 20.0–28.0)
Calcium, Ion: 1.19 mmol/L (ref 1.15–1.40)
HCT: 35 % — ABNORMAL LOW (ref 36.0–46.0)
Hemoglobin: 11.9 g/dL — ABNORMAL LOW (ref 12.0–15.0)
O2 Saturation: 97 %
Potassium: 3.2 mmol/L — ABNORMAL LOW (ref 3.5–5.1)
Sodium: 140 mmol/L (ref 135–145)
TCO2: 14 mmol/L — ABNORMAL LOW (ref 22–32)
pCO2, Ven: 25.3 mmHg — ABNORMAL LOW (ref 44.0–60.0)
pH, Ven: 7.321 (ref 7.250–7.430)
pO2, Ven: 98 mmHg — ABNORMAL HIGH (ref 32.0–45.0)

## 2020-11-19 LAB — CBC
HCT: 31.6 % — ABNORMAL LOW (ref 36.0–46.0)
Hemoglobin: 9.9 g/dL — ABNORMAL LOW (ref 12.0–15.0)
MCH: 25.7 pg — ABNORMAL LOW (ref 26.0–34.0)
MCHC: 31.3 g/dL (ref 30.0–36.0)
MCV: 82.1 fL (ref 80.0–100.0)
Platelets: 257 10*3/uL (ref 150–400)
RBC: 3.85 MIL/uL — ABNORMAL LOW (ref 3.87–5.11)
RDW: 19.7 % — ABNORMAL HIGH (ref 11.5–15.5)
WBC: 6.6 10*3/uL (ref 4.0–10.5)
nRBC: 0 % (ref 0.0–0.2)

## 2020-11-19 LAB — RESP PANEL BY RT-PCR (FLU A&B, COVID) ARPGX2
Influenza A by PCR: NEGATIVE
Influenza B by PCR: NEGATIVE
SARS Coronavirus 2 by RT PCR: NEGATIVE

## 2020-11-19 LAB — MAGNESIUM: Magnesium: 1.7 mg/dL (ref 1.7–2.4)

## 2020-11-19 LAB — LIPASE, BLOOD
Lipase: 55 U/L — ABNORMAL HIGH (ref 11–51)
Lipase: 43 U/L (ref 11–51)

## 2020-11-19 LAB — TROPONIN I (HIGH SENSITIVITY)
Troponin I (High Sensitivity): 7 ng/L (ref <18)
Troponin I (High Sensitivity): 8 ng/L (ref <18)

## 2020-11-19 LAB — I-STAT BETA HCG BLOOD, ED (MC, WL, AP ONLY): I-stat hCG, quantitative: <5 m[IU]/mL (ref <5)

## 2020-11-19 MED ORDER — DICYCLOMINE HCL 20 MG PO TABS: 20.0000 mg | ORAL_TABLET | Freq: Two times a day (BID) | ORAL | 0 refills | Status: AC

## 2020-11-19 MED ORDER — ONDANSETRON 4 MG PO TBDP
4.0000 mg | ORAL_TABLET | Freq: Once | ORAL | Status: AC
  Administered 2020-11-19: 4 mg via ORAL
  Filled 2020-11-19: qty 1

## 2020-11-19 MED ORDER — IOHEXOL 300 MG/ML  SOLN
100.0000 mL | Freq: Once | INTRAMUSCULAR | Status: AC | PRN
  Administered 2020-11-19: 100 mL via INTRAVENOUS

## 2020-11-19 MED ORDER — METHYLPREDNISOLONE SODIUM SUCC 125 MG IJ SOLR
80.0000 mg | Freq: Once | INTRAMUSCULAR | Status: AC
  Administered 2020-11-19: 80 mg via INTRAVENOUS
  Filled 2020-11-19: qty 2

## 2020-11-19 MED ORDER — ONDANSETRON 4 MG PO TBDP: 4.0000 mg | ORAL_TABLET | Freq: Three times a day (TID) | ORAL | 0 refills | Status: DC | PRN

## 2020-11-19 MED ORDER — HYDROMORPHONE HCL 1 MG/ML IJ SOLN
1.0000 mg | Freq: Once | INTRAMUSCULAR | Status: AC
  Administered 2020-11-19: 1 mg via SUBCUTANEOUS
  Filled 2020-11-19: qty 1

## 2020-11-19 MED ORDER — IPRATROPIUM-ALBUTEROL 0.5-2.5 (3) MG/3ML IN SOLN
3.0000 mL | Freq: Once | RESPIRATORY_TRACT | Status: AC
  Administered 2020-11-19: 3 mL via RESPIRATORY_TRACT
  Filled 2020-11-19: qty 3

## 2020-11-19 MED ORDER — MORPHINE SULFATE (PF) 4 MG/ML IV SOLN
4.0000 mg | Freq: Once | INTRAVENOUS | Status: AC
Start: 2020-11-19 — End: 2020-11-19
  Administered 2020-11-19: 4 mg via INTRAVENOUS
  Filled 2020-11-19: qty 1

## 2020-11-19 MED ORDER — DICYCLOMINE HCL 10 MG/ML IM SOLN
20.0000 mg | Freq: Once | INTRAMUSCULAR | Status: AC
  Administered 2020-11-19: 20 mg via INTRAMUSCULAR
  Filled 2020-11-19: qty 2

## 2020-11-19 MED ORDER — HALOPERIDOL LACTATE 5 MG/ML IJ SOLN
5.0000 mg | Freq: Once | INTRAMUSCULAR | Status: AC
  Administered 2020-11-19: 5 mg via INTRAMUSCULAR
  Filled 2020-11-19: qty 1

## 2020-11-19 MED ORDER — SODIUM CHLORIDE 0.9 % IV BOLUS
500.0000 mL | Freq: Once | INTRAVENOUS | Status: AC
  Administered 2020-11-19: 500 mL via INTRAVENOUS

## 2020-11-19 MED ORDER — LORAZEPAM 2 MG/ML IJ SOLN
0.5000 mg | Freq: Once | INTRAMUSCULAR | Status: AC
  Administered 2020-11-19: 0.5 mg via INTRAVENOUS
  Filled 2020-11-19: qty 1

## 2020-11-19 MED ORDER — ACETAMINOPHEN 325 MG PO TABS: 650.0000 mg | ORAL_TABLET | Freq: Four times a day (QID) | ORAL | 0 refills | Status: AC | PRN

## 2020-11-19 MED ORDER — HYDROCODONE-ACETAMINOPHEN 5-325 MG PO TABS
2.0000 | ORAL_TABLET | Freq: Once | ORAL | Status: AC
Start: 2020-11-19 — End: 2020-11-19
  Administered 2020-11-19: 2 via ORAL
  Filled 2020-11-19: qty 2

## 2020-11-19 MED ORDER — SODIUM CHLORIDE 0.9 % IV BOLUS
1000.0000 mL | Freq: Once | INTRAVENOUS | Status: AC
  Administered 2020-11-19: 1000 mL via INTRAVENOUS

## 2020-11-19 MED ORDER — LORAZEPAM 2 MG/ML IJ SOLN
1.0000 mg | Freq: Once | INTRAMUSCULAR | Status: AC
  Administered 2020-11-19: 1 mg via INTRAVENOUS
  Filled 2020-11-19: qty 1

## 2020-11-19 NOTE — ED Notes (Signed)
Discharge paperwork given to pt along with prescriptions. Pt agreeable to discharge and understands instructions. Esignature pad not working. 

## 2020-11-19 NOTE — ED Notes (Signed)
Pt complaining of chest pain, troponin added on to blood work.

## 2020-11-19 NOTE — ED Provider Notes (Signed)
[facility] Provider Note   CSN: [id number] Arrival date & time: 11/19/20  [id number]     History Chief Complaint  Patient presents with   Abdominal Pain    Vicki Moore is a 40 y.o. female.  This is a 40 year old female with history as below presented to the ER secondary to dyspnea, abdominal pain.  Patient is underlying issue of asthma, recently quit smoking.  She has been using Nebulizer at home more frequently which has improved her symptoms.  She is coughing up green phlegm over the past week.  Reports that she has not gotten any better since being discharged in the hospital approximately 2 weeks ago.  She has ongoing concerns over her periumbilical hernia, she believes the pain is gotten worse over the past 24 hours and is having nausea and vomiting.  Intermittent diarrhea.  No change urination. Denies Melena. She has been taking some left over Percocet at home which has not improved her pain, last dose was this AM around 0500. She has known history of opiate dependence/withdrawal per EMR. No fevers, chills. No suspicious oral intake, no sick contacts.  No concern for STI. No chest pain.   She has history of prior opiate overdose/abuse.    The history is provided by the patient. No language interpreter was used.  Abdominal Pain Associated symptoms: cough, diarrhea, nausea, shortness of breath and vomiting   Associated symptoms: no chest pain, no chills, no fever and no hematuria       Past Medical History:  Diagnosis Date   Anxiety    Asthma    Asthma    Depression    GERD (gastroesophageal reflux disease)     Patient Active Problem List   Diagnosis Date Noted   Acute respiratory failure with hypoxia (HCC) 11/10/2020   Elevated LFTs 11/10/2020   Opioid abuse (HCC) 11/10/2020   Chronic prescription opiate use 08/04/2020   Benzodiazepine withdrawal (HCC) 08/03/2020   Chronic pain 08/03/2020   Hypokalemia 08/03/2020   Alcohol  abuse 08/03/2020   Tobacco abuse 08/03/2020   Anxiety disorder    Nephrolithiasis 01/10/2020   Biliary colic    Right upper quadrant pain 11/08/2019   RUQ pain 11/07/2019   GERD (gastroesophageal reflux disease) 11/07/2019   Bilateral foot pain 12/08/2017   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     History reviewed. No pertinent family history.  Social History   Tobacco Use   Smoking status: Some Days    Packs/day: 0.25    Years: 10.00    Pack years: 2.50    Types: Cigarettes   Smokeless tobacco: Never  Vaping Use   Vaping Use: Never used  Substance Use Topics   Alcohol use: Yes    Comment: rare   Drug use: Not Currently    Home Medications Prior to Admission medications   Medication Sig Start Date End Date Taking? Authorizing  Provider  acetaminophen (TYLENOL) 325 MG tablet Take 2 tablets (650 mg total) by mouth every 6 (six) hours as needed. 11/19/20  Yes Tanda Rockers A, DO  albuterol (PROVENTIL HFA;VENTOLIN HFA) 108 (90 BASE) MCG/ACT inhaler Inhale 2 puffs into the lungs every 6 (six) hours as needed for wheezing or shortness of breath. For shortness of breath   Yes [provider]  alprazolam Prudy Feeler) 2 MG tablet Take 2 mg by mouth in the morning, at noon, and at bedtime.   Yes [provider]  Calcium Carbonate Antacid (TUMS PO) Take 1-2 tablets by mouth every hour as needed (acid  reflux/heartburn).   Yes [provider]  dicyclomine (BENTYL) 20 MG tablet Take 1 tablet (20 mg total) by mouth 2 (two) times daily. 11/19/20  Yes Tanda Rockers A, DO  fluticasone-salmeterol (ADVAIR DISKUS) 500-50 MCG/ACT AEPB Inhale 1 puff into the lungs 2 (two) times daily. 11/12/20  Yes Noralee Stain, DO  ipratropium-albuterol (DUONEB) 0.5-2.5 (3) MG/3ML SOLN Take 3 mLs by nebulization 3 (three) times daily as needed for wheezing or shortness of breath. 10/11/19  Yes [provider]  ondansetron (ZOFRAN ODT) 4 MG disintegrating tablet Take 1 tablet (4 mg total) by mouth every 8 (eight) hours as needed for nausea or vomiting. 11/19/20  Yes Tanda Rockers A, DO  pantoprazole (PROTONIX) 40 MG tablet Take 1 tablet (40 mg total) by mouth 2 (two) times daily before a meal. 11/15/19  Yes Amin, Ankit Chirag, MD  tetrahydrozoline-zinc (VISINE-AC) 0.05-0.25 % ophthalmic solution Place 1 drop into both eyes 3 (three) times daily as needed (dry eyes).   Yes [provider]  valACYclovir (VALTREX) 1000 MG tablet Take 1,000 mg by mouth 2 (two) times daily as needed (breakouts). 02/09/20  Yes [provider]  nicotine (NICODERM CQ - DOSED IN MG/24 HOURS) 14 mg/24hr patch Place 1 patch (14 mg total) onto the skin daily. Patient not taking: No sig reported 08/07/20   Joycelyn Das, MD    Allergies    Peanut-containing drug products and Penicillins  Review of Systems   Review of Systems  Constitutional:  Negative for chills and fever.  HENT:  Negative for facial swelling and trouble swallowing.   Eyes:  Negative for photophobia and visual disturbance.  Respiratory:  Positive for cough and shortness of breath.   Cardiovascular:  Negative for chest pain and palpitations.  Gastrointestinal:  Positive for abdominal pain, diarrhea, nausea and vomiting.  Endocrine: Negative for polydipsia and polyuria.  Genitourinary:  Negative for difficulty urinating and hematuria.  Musculoskeletal:   Negative for gait problem and joint swelling.  Skin:  Negative for pallor and rash.  Neurological:  Negative for syncope and headaches.  Psychiatric/Behavioral:  Negative for agitation and confusion.    Physical Exam Updated Vital Signs BP 118/81   Pulse 87   Temp 98.6 F (37 C) (Oral)   Resp 15   SpO2 99%   Physical Exam Vitals and nursing note reviewed.  Constitutional:      General: She is not in acute distress.    Appearance: Normal appearance.  HENT:     Head: Normocephalic and atraumatic.     Right Ear: External ear normal.     Left Ear: External ear normal.     Nose: Nose normal.     Mouth/Throat:     Mouth: Mucous membranes are moist.  Eyes:     General: No scleral icterus.       Right eye: No discharge.  Left eye: No discharge.  Cardiovascular:     Rate and Rhythm: Normal rate and regular rhythm.     Pulses: Normal pulses.     Heart sounds: Normal heart sounds.  Pulmonary:     Effort: Pulmonary effort is normal. Tachypnea present. No accessory muscle usage, respiratory distress or retractions.     Breath sounds: Decreased breath sounds and wheezing present.     Comments: Wheeze b/l Abdominal:     General: Abdomen is flat.     Palpations: Abdomen is soft.     Tenderness: There is generalized abdominal tenderness.       Comments: Generalized tenderness, pain at periumbilical hernia site.  Musculoskeletal:        General: Normal range of motion.     Cervical back: Normal range of motion.     Right lower leg: No edema.     Left lower leg: No edema.  Skin:    General: Skin is warm and dry.     Capillary Refill: Capillary refill takes less than 2 seconds.  Neurological:     Mental Status: She is alert.  Psychiatric:        Mood and Affect: Mood normal.        Behavior: Behavior normal.    ED Results / Procedures / Treatments   Labs (all labs ordered are listed, but only abnormal results are displayed) Labs Reviewed  LIPASE, BLOOD - Abnormal;  Notable for the following components:      Result Value   Lipase 55 (*)    All other components within normal limits  COMPREHENSIVE METABOLIC PANEL - Abnormal; Notable for the following components:   Potassium 3.3 (*)    Glucose, Bld 101 (*)    AST 81 (*)    ALT 83 (*)    All other components within normal limits  CBC - Abnormal; Notable for the following components:   RBC 3.85 (*)    Hemoglobin 9.9 (*)    HCT 31.6 (*)    MCH 25.7 (*)    RDW 19.7 (*)    All other components within normal limits  RESP PANEL BY RT-PCR (FLU A&B, COVID) ARPGX2  URINALYSIS, ROUTINE W REFLEX MICROSCOPIC  I-STAT BETA HCG BLOOD, ED ([facility], [facility], [facility] ONLY)  TROPONIN I (HIGH SENSITIVITY)    EKG EKG Interpretation  Date/Time:  Tuesday November 19 2020 07:20:44 EDT Ventricular Rate:  101 PR Interval:  80 QRS Duration: 104 QT Interval:  374 QTC Calculation: 484 R Axis:   5 Text Interpretation: Sinus tachycardia Similar to prior tracing Confirmed by Tanda Rockers ([id number]) on 11/19/2020 11:14:52 AM  Radiology CT ABDOMEN PELVIS W CONTRAST  Result Date: 11/19/2020 CLINICAL DATA:  Abdominal pain, nonlocalized periumbilical pain EXAM: CT ABDOMEN AND PELVIS WITH CONTRAST TECHNIQUE: Multidetector CT imaging of the abdomen and pelvis was performed using the standard protocol following bolus administration of intravenous contrast. CONTRAST:  OMNIPAQUE IOHEXOL 300 MG/ML  SOLN COMPARISON:  CT abdomen/pelvis 04/23/2020 FINDINGS: Lower chest: The lung bases are clear. The imaged heart is unremarkable. Hepatobiliary: The liver is diffusely hypoattenuating consistent with fatty infiltration, similar to priors. The gallbladder is surgically absent. There is no biliary ductal dilatation. Pancreas: Normal. Spleen: Normal. Adrenals/Urinary Tract: The adrenals are unremarkable. A hypodense lesion in the left mid kidney present since 2011 likely reflects a cyst. There are no other lesions. There are no calculi. There is no hydronephrosis  or hydroureter. The bladder is unremarkable. Stomach/Bowel: There is a small hiatal hernia, unchanged.  The stomach is otherwise unremarkable. There is no evidence of bowel obstruction. There is no abnormal bowel wall thickening or inflammatory change. The appendix is not definitively identified, but there is no pericecal inflammatory change. Vascular/Lymphatic: The abdominal aorta is nonaneurysmal. The main portal and splenic veins are patent. There is no abdominal or pelvic lymphadenopathy. Reproductive: The uterus and adnexa are unremarkable. Other: There is a fat containing umbilical hernia, unchanged. There is no ascites or free air. Musculoskeletal: There is no acute osseous abnormality. IMPRESSION: 1. No acute findings in the abdomen or pelvis to explain the patient's symptoms. 2. Fatty infiltration of the liver, unchanged. 3. Small hiatal hernia, unchanged. Electronically Signed   By: Lesia Hausen MD   On: 11/19/2020 12:17   DG Chest Portable 1 View  Result Date: 11/19/2020 CLINICAL DATA:  Wheezing, asthma, dyspnea. EXAM: PORTABLE CHEST 1 VIEW COMPARISON:  11/10/2020 FINDINGS: The cardiomediastinal silhouette is unchanged with normal heart size. The lungs are mildly hypoinflated with mild accentuation of the interstitial markings. No overt pulmonary edema, confluent airspace opacity, sizeable pleural effusion, or pneumothorax is identified. No acute osseous abnormality is seen. IMPRESSION: No active disease. Electronically Signed   By: Sebastian Ache M.D.   On: 11/19/2020 11:07    Procedures Procedures   Medications Ordered in ED Medications  ondansetron (ZOFRAN-ODT) disintegrating tablet 4 mg (4 mg Oral Given 11/19/20 0932)  ipratropium-albuterol (DUONEB) 0.5-2.5 (3) MG/3ML nebulizer solution 3 mL (3 mLs Nebulization Given 11/19/20 0932)  methylPREDNISolone sodium succinate (SOLU-MEDROL) 125 mg/2 mL injection 80 mg (80 mg Intravenous Given 11/19/20 1033)  HYDROmorphone (DILAUDID) injection 1 mg (1 mg  Subcutaneous Given 11/19/20 0932)  ondansetron (ZOFRAN-ODT) disintegrating tablet 4 mg (4 mg Oral Given 11/19/20 1018)  morphine 4 MG/ML injection 4 mg (4 mg Intravenous Given 11/19/20 1032)  sodium chloride 0.9 % bolus 1,000 mL (0 mLs Intravenous Stopped 11/19/20 1501)  haloperidol lactate (HALDOL) injection 5 mg (5 mg Intramuscular Given 11/19/20 1120)  iohexol (OMNIPAQUE) 300 MG/ML solution 100 mL (100 mLs Intravenous Contrast Given 11/19/20 1144)  ipratropium-albuterol (DUONEB) 0.5-2.5 (3) MG/3ML nebulizer solution 3 mL (3 mLs Nebulization Given 11/19/20 1204)  HYDROcodone-acetaminophen (NORCO/VICODIN) 5-325 MG per tablet 2 tablet (2 tablets Oral Given 11/19/20 1336)  dicyclomine (BENTYL) injection 20 mg (20 mg Intramuscular Given 11/19/20 1351)    ED Course  I have reviewed the triage vital signs and the nursing notes.  Pertinent labs & imaging results that were available during my care of the patient were reviewed by me and considered in my medical decision making (see chart for details).  Clinical Course as of 11/19/20 1725  Tue Nov 19, 2020  1014 Delay in obtaining labs as patient with poor IV access, awaiting IV team for IV access.  [SG]    Clinical Course User Index [SG] Sloan Leiter, DO   MDM Rules/Calculators/A&P                           This is 40 yo female with history as above presenting to ED for multiple complaints. Wheezing noted on exam, tachypnea, she is not hypoxic, no accessory muscle use. Known history of asthma and tobacco use. Abdominal pain, non surgical abdomen. She is also tachycardic. Vital signs otherwise stable. Afebrile. Serious etiology considered.  Obtain CXR, give duonebs and steroids for wheezing and dyspnea. Concern for possible infectious process. ECG reviewed and is similar to prior, concern for ventricular pre-excitation. HR is stable, no chest pain,  normotensive.  Obtain CT abdomen/pelvis with IV contrast to evaluate abdominal pain.  11:20 AM Patient with  ongoing abdominal pain, emesis. Given second dose of analgesic. ECG reviewed, given Haldol as patient with worsening anxiousness and ongoing emesis. IVF are in progress.  Labs reviewed. Mild hypokalemia, mild elevation to AST/ALT and Lipase which is chronic. History of fatty liver disease. Hgb is similar to her baseline. COVID 19 negative, CXR negative. Wheezing improved after steroids/duonebs. She is not hypoxic. Awaiting CT at this time.  CT imaging reviewed and is non-acute. Her symptoms have improved and she is able to tolerate oral intake. She has a small reducible peri-umbilical hernia. Labs are stable.   Patient is requesting opiates at discharge but given history of prior opiate overdose/abuse am unable to discharge with opiates at this time. Will give her bentyl, anti-emetics. Given general surgery follow up regarding hernia.      The patient's overall condition has improved, the patient presents with abdominal pain without signs of peritonitis, or other life-threatening serious etiology. Repeat abdominal exam is soft, non-peritoneal.The patient understands that at this time there is no evidence for a more malignant underlying process, but the patient also understands that early in the process of an illness, an emergency department workup can be falsely reassuring. Detailed discussions were had with the patient regarding current findings, and need for close f/u with PCP or on call doctor. The patient appears stable for discharge and has been instructed to return immediately if the symptoms worsen in any way, or in 8-12hr if not improved for re-evaluation. Patient verbalized understanding and is in agreement with current care plan.  All questions answered prior to discharge.  Final Clinical Impression(s) / ED Diagnoses Final diagnoses:  Abdominal pain, unspecified abdominal location  Periumbilical hernia    Rx / DC Orders ED Discharge Orders          Ordered    dicyclomine (BENTYL) 20  MG tablet  2 times daily        11/19/20 1458    acetaminophen (TYLENOL) 325 MG tablet  Every 6 hours PRN        11/19/20 1458    ondansetron (ZOFRAN ODT) 4 MG disintegrating tablet  Every 8 hours PRN        11/19/20 1507             Sloan Leiter, DO 11/19/20 1725

## 2020-11-19 NOTE — ED Provider Notes (Signed)
Emergency Department Provider Note   I have reviewed the triage vital signs and the nursing notes.   HISTORY  Chief Complaint Shortness of Breath   HPI Vicki Moore is a 40 y.o. female with past medical history reviewed below including anxiety, asthma, chronic abdominal pain, opiate abuse presents to the emergency department by EMS who were called out for respiratory distress.  Patient was seen in the emergency department earlier today with abdominal pain along with wheezing and shortness of breath.  She had CT imaging of the abdomen and pelvis along with labs, chest x-ray. Patient given nebs and seemed to clinically improved.  EMS arrived to find the patient with reportedly silent lung sounds.  She was given 2 albuterol nebs and a DuoNeb prior to arrival.  She arrives on BiPAP.  They report having to cardiovert the patient out of SVT in route when she had a heart rate in the 170s for around 2 minutes.  No hypotension.   Room air O2 sats with EMS reportedly 85% RA.   Past Medical History:  Diagnosis Date   Anxiety    Asthma    Asthma    Depression    GERD (gastroesophageal reflux disease)     Patient Active Problem List   Diagnosis Date Noted   Acute asthma exacerbation 11/20/2020   Acute hypoxemic respiratory failure (HCC) 11/20/2020   Abdominal pain 11/20/2020   SVT (supraventricular tachycardia) (HCC) 11/20/2020   Lactic acidosis 11/20/2020   Acute respiratory failure with hypoxia (HCC) 11/10/2020   Elevated LFTs 11/10/2020   Opioid abuse (HCC) 11/10/2020   Chronic prescription opiate use 08/04/2020   Benzodiazepine withdrawal (HCC) 08/03/2020   Chronic pain 08/03/2020   Hypokalemia 08/03/2020   Alcohol abuse 08/03/2020   Tobacco abuse 08/03/2020   Anxiety disorder    Nephrolithiasis 01/10/2020   Biliary colic    Right upper quadrant pain 11/08/2019   RUQ pain 11/07/2019   GERD (gastroesophageal reflux disease) 11/07/2019   Bilateral foot pain 12/08/2017    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 No pertinent family history.  Social History Social History   Tobacco Use   Smoking status: Some Days    Packs/day: 0.25    Years: 10.00    Pack years: 2.50    Types: Cigarettes   Smokeless tobacco: Never  Vaping Use   Vaping Use: Never used  Substance Use Topics   Alcohol use: Yes    Comment: rare   Drug use: Not Currently    Review of Systems  Constitutional: No fever/chills Eyes: No visual changes. ENT: No sore throat. Cardiovascular: Denies chest pain. Respiratory: Positive shortness of breath. Gastrointestinal: Positive chronic abdominal pain.  No nausea, no vomiting.  No diarrhea.  No constipation. Genitourinary: Negative for dysuria. Musculoskeletal: Negative for back pain. Skin: Negative for rash. Neurological: Negative for headaches, focal weakness or numbness.  10-point ROS otherwise negative.  ____________________________________________   PHYSICAL  EXAM:  VITAL SIGNS: ED Triage Vitals  Enc Vitals Group     BP 11/19/20 2045 134/67     Pulse Rate 11/19/20 2043 (!) 112     Resp 11/19/20 2045 19     Temp 11/19/20 2043 (!) 97 F (36.1 C)     Temp Source 11/19/20 2043 Temporal     SpO2 11/19/20 2043 100 %    Constitutional: Alert and oriented. Some increased WOB on arrival.  Eyes: Conjunctivae are normal.   Head: Atraumatic. Nose: No congestion/rhinnorhea. Mouth/Throat: Mucous membranes are moist.  Oropharynx non-erythematous. Neck: No stridor.  Cardiovascular: Normal rate, regular rhythm. Good peripheral circulation. Grossly normal heart sounds.   Respiratory: Normal respiratory effort.  No retractions. Lungs CTAB. Gastrointestinal: Soft with focal, periumbilical tenderness. No distention.  Musculoskeletal: No lower extremity tenderness nor edema. No gross deformities of extremities. Neurologic:  Normal speech and language. No gross focal neurologic deficits are appreciated.  Skin:  Skin is warm, dry and intact. No rash noted.  ____________________________________________   LABS (all labs ordered are listed, but only abnormal results are displayed)  Labs Reviewed  COMPREHENSIVE METABOLIC PANEL - Abnormal; Notable for the following components:      Result Value   Potassium 3.4 (*)    CO2 9 (*)    Glucose, Bld 163 (*)    Creatinine, Ser 1.09 (*)    AST 85 (*)    ALT 84 (*)    Alkaline Phosphatase 128 (*)    Anion gap 27 (*)    All other components within normal limits  CBC WITH DIFFERENTIAL/PLATELET - Abnormal; Notable for the following components:   WBC 11.9 (*)    Hemoglobin 11.0 (*)    MCH 25.5 (*)    MCHC 29.5 (*)    RDW 20.0 (*)    Platelets 439 (*)    Neutro Abs 9.7 (*)    Abs Immature Granulocytes 0.18 (*)    All other components within normal limits  LACTIC ACID, PLASMA - Abnormal; Notable for the following components:   Lactic Acid, Venous >11.0 (*)    All other components within normal limits  LACTIC ACID, PLASMA - Abnormal; Notable for the following components:   Lactic Acid, Venous 6.5 (*)    All other components within normal limits  BASIC METABOLIC PANEL - Abnormal; Notable for the following components:   Potassium 3.0 (*)    CO2 18 (*)    Glucose, Bld 132 (*)    Anion gap 16 (*)    All other components within normal limits  RAPID URINE DRUG SCREEN, HOSP  PERFORMED - Abnormal; Notable for the following components:   Opiates POSITIVE (*)    Benzodiazepines POSITIVE (*)    All other components within normal limits  URINALYSIS, COMPLETE (UACMP) WITH MICROSCOPIC - Abnormal; Notable for the following components:   Ketones, ur 5 (*)    Bacteria, UA RARE (*)    All other components within normal limits  CBC - Abnormal; Notable for the following components:   RBC 3.84 (*)    Hemoglobin 9.8 (*)    HCT 31.7 (*)    MCH 25.5 (*)    RDW 20.0 (*)    All other components within normal limits  COMPREHENSIVE METABOLIC PANEL - Abnormal; Notable for the following components:   AST 54 (*)    ALT 79 (*)    All other components within normal limits  I-STAT VENOUS BLOOD GAS, ED - Abnormal; Notable for the following components:  pCO2, Ven 25.3 (*)    pO2, Ven 98.0 (*)    Bicarbonate 13.1 (*)    TCO2 14 (*)    Acid-base deficit 11.0 (*)    Potassium 3.2 (*)    HCT 35.0 (*)    Hemoglobin 11.9 (*)    All other components within normal limits  CULTURE, BLOOD (ROUTINE X 2)  CULTURE, BLOOD (ROUTINE X 2) W REFLEX TO ID PANEL  LIPASE, BLOOD  MAGNESIUM  D-DIMER, QUANTITATIVE  LACTIC ACID, PLASMA  TSH  OCCULT BLOOD X 1 CARD TO LAB, STOOL  TROPONIN I (HIGH SENSITIVITY)  TROPONIN I (HIGH SENSITIVITY)   ____________________________________________  EKG   EKG Interpretation  Date/Time:  Tuesday November 19 2020 20:44:57 EDT Ventricular Rate:  119 PR Interval:  109 QRS Duration: 102 QT Interval:  348 QTC Calculation: 490 R Axis:   30 Text Interpretation: Sinus tachycardia Abnormal R-wave progression, early transition Borderline prolonged QT interval Confirmed by Alona BeneLong, Sahira Cataldi [phone]([id number]) on 11/19/2020 10:06:20 PM Also confirmed by Alona BeneLong, Labrandon Knoch [phone]), editor Jillene BucksBarham, Susan [phone])  on 11/20/2020 9:41:33 AM        ____________________________________________  RADIOLOGY  CXR reviewed.    ____________________________________________   PROCEDURES  Procedure(s) performed:   Procedures  CRITICAL CARE Performed by: Maia PlanJoshua G Keiandra Sullenger Total critical care time: 35 minutes Critical care time was exclusive of separately billable procedures and treating other patients. Critical care was necessary to treat or prevent imminent or life-threatening deterioration. Critical care was time spent personally by me on the following activities: development of treatment plan with patient and/or surrogate as well as nursing, discussions with consultants, evaluation of patient's response to treatment, examination of patient, obtaining history from patient or surrogate, ordering and performing treatments and interventions, ordering and review of laboratory studies, ordering and review of radiographic studies, pulse oximetry and re-evaluation of patient's condition.  Alona BeneJoshua Taro Hidrogo, MD Emergency Medicine  ____________________________________________   INITIAL IMPRESSION / ASSESSMENT AND PLAN / ED COURSE  Pertinent labs & imaging results that were available during my care of the patient were reviewed by me and considered in my medical decision making (see chart for details).   Patient presents emergency department with shortness of breath.  No appreciable wheezing on my exam.  Patient's work of breathing seems increased.  She had reportedly very restricted lungs to EMS and improved with albuterol did require cardioversion as she did go into SVT.  Patient's abdominal pain seems chronic.  Exam seems similar to her recent ED presentation earlier today.  CT imaging at that time of the abdomen showed no acute process.  Patient has had multiple CTA PE scans this year.  Do not plan on repeating that here.  Plan for Ativan thinking there could be some anxiety component.  Patient has already been treated for possible asthma exacerbation.  We will continue on BiPAP and wean as we are able.  11:20 PM  Labs  reviewed. Spoke with [facility]. Discussed CTA PE and call back. Patient with very small, peripheral IV. Multiple CTA PE studies this year. Will add d-dimer and repeat chemistry.   Discussed patient's case with [facility] to request admission. Patient and family (if present) updated with plan. [facility].  I reviewed all nursing notes, vitals, pertinent old records, EKGs, labs, imaging (as available).  ____________________________________________  FINAL CLINICAL IMPRESSION(S) / ED DIAGNOSES  Final diagnoses:  Respiratory distress  AKI (acute kidney injury) (HCC)     MEDICATIONS GIVEN DURING THIS VISIT:  Medications  predniSONE (DELTASONE)  tablet 40 mg (40 mg Oral Given 11/21/20 1001)  0.9 %  sodium chloride infusion (0 mLs Intravenous Hold 11/20/20 0700)  budesonide (PULMICORT) nebulizer solution 0.25 mg (0.25 mg Nebulization Given 11/22/20 0728)  scopolamine (TRANSDERM-SCOP) 1 MG/3DAYS 1.5 mg (1.5 mg Transdermal Patch Applied 11/21/20 0042)  oxyCODONE-acetaminophen (PERCOCET/ROXICET) 5-325 MG per tablet 1 tablet (1 tablet Oral Given 11/22/20 0351)  acetaminophen (TYLENOL) tablet 650 mg (has no administration in time range)  ALPRAZolam (XANAX) tablet 2 mg (2 mg Oral Given 11/22/20 0605)  dicyclomine (BENTYL) tablet 20 mg (20 mg Oral Given 11/21/20 2135)  calcium carbonate (TUMS - dosed in mg elemental calcium) chewable tablet 200 mg of elemental calcium (has no administration in time range)  ipratropium-albuterol (DUONEB) 0.5-2.5 (3) MG/3ML nebulizer solution 3 mL (3 mLs Nebulization Given 11/21/20 0153)  nicotine (NICODERM CQ - dosed in mg/24 hours) patch 14 mg (14 mg Transdermal Patch Applied 11/21/20 0807)  ondansetron (ZOFRAN-ODT) disintegrating tablet 4 mg (4 mg Oral Given 11/21/20 1609)  pantoprazole (PROTONIX) EC tablet 40 mg (40 mg Oral Given 11/21/20 1715)  valACYclovir (VALTREX) tablet 1,000 mg (has no administration in time range)  ketorolac (TORADOL) 15 MG/ML injection 15 mg  (15 mg Intravenous Given 11/22/20 0617)  zolpidem (AMBIEN) tablet 5 mg (5 mg Oral Given 11/21/20 2136)  ondansetron (ZOFRAN) injection 4 mg (4 mg Intravenous Given 11/22/20 0526)  promethazine (PHENERGAN) 12.5 mg in sodium chloride 0.9 % 50 mL IVPB (12.5 mg Intravenous New Bag/Given 11/21/20 0401)  ipratropium (ATROVENT) nebulizer solution 0.5 mg (0.5 mg Nebulization Given 11/22/20 0728)  levalbuterol (XOPENEX) nebulizer solution 0.63 mg (0.63 mg Nebulization Given 11/22/20 0728)  lidocaine (LIDODERM) 5 % 1 patch (1 patch Transdermal Patient Refused/Not Given 11/21/20 2135)  polyethylene glycol (MIRALAX / GLYCOLAX) packet 17 g (has no administration in time range)  ketorolac (TORADOL) 15 MG/ML injection 15 mg (has no administration in time range)  COVID-19 mRNA Vac-TriS ([employer]) injection 0.3 mL (has no administration in time range)  sodium chloride 0.9 % bolus 500 mL (0 mLs Intravenous Stopped 11/19/20 2235)  LORazepam (ATIVAN) injection 0.5 mg (0.5 mg Intravenous Given 11/19/20 2114)  LORazepam (ATIVAN) injection 1 mg (1 mg Intravenous Given 11/19/20 2234)  sodium chloride 0.9 % bolus 500 mL (0 mLs Intravenous Stopped 11/20/20 0004)  ketorolac (TORADOL) 15 MG/ML injection 15 mg (15 mg Intravenous Given 11/20/20 0543)  potassium chloride SA (KLOR-CON) CR tablet 40 mEq (40 mEq Oral Given 11/20/20 0640)  LORazepam (ATIVAN) injection 1 mg (1 mg Intravenous Given 11/20/20 0917)  potassium chloride SA (KLOR-CON) CR tablet 40 mEq (40 mEq Oral Given 11/20/20 1416)      Note:  This document was prepared using Dragon voice recognition software and may include unintentional dictation errors.  Alona Bene, MD, [facility] Emergency Medicine    Hill Mackie, Arlyss Repress, MD 11/22/20 [phone]

## 2020-11-19 NOTE — ED Triage Notes (Signed)
Pt BIB [facility] from home, c/o increased shortness of breath, given albuterol treatments x 2 and 1 duoneb by EMS. Initially pt's HR 130, and increased to 180s, cardioverted by EMS. SpO2 88% on room air, started on CPAP pta.

## 2020-11-19 NOTE — ED Triage Notes (Signed)
Pt bib ems from home with adb pain NV. Pain X1 week with worsening last night. Hx asthma, given 5mg  albuterol and 0.5mg  atrovent due to wheezing. Pt taking percocet at home without relief. Pt took odt zofran approx 0600 today.  152/80 HR 104 RR 20 98% RA 96.98F

## 2020-11-20 ENCOUNTER — Encounter (HOSPITAL_COMMUNITY): Payer: Self-pay | Admitting: Internal Medicine

## 2020-11-20 DIAGNOSIS — R112 Nausea with vomiting, unspecified: Secondary | ICD-10-CM | POA: Diagnosis not present

## 2020-11-20 DIAGNOSIS — E669 Obesity, unspecified: Secondary | ICD-10-CM | POA: Diagnosis present

## 2020-11-20 DIAGNOSIS — K3184 Gastroparesis: Secondary | ICD-10-CM | POA: Diagnosis present

## 2020-11-20 DIAGNOSIS — I456 Pre-excitation syndrome: Secondary | ICD-10-CM | POA: Diagnosis not present

## 2020-11-20 DIAGNOSIS — J9601 Acute respiratory failure with hypoxia: Secondary | ICD-10-CM | POA: Diagnosis not present

## 2020-11-20 DIAGNOSIS — J4541 Moderate persistent asthma with (acute) exacerbation: Secondary | ICD-10-CM | POA: Diagnosis present

## 2020-11-20 DIAGNOSIS — I471 Supraventricular tachycardia: Secondary | ICD-10-CM

## 2020-11-20 DIAGNOSIS — K429 Umbilical hernia without obstruction or gangrene: Secondary | ICD-10-CM | POA: Diagnosis not present

## 2020-11-20 DIAGNOSIS — N179 Acute kidney failure, unspecified: Secondary | ICD-10-CM | POA: Diagnosis not present

## 2020-11-20 DIAGNOSIS — R109 Unspecified abdominal pain: Secondary | ICD-10-CM | POA: Diagnosis not present

## 2020-11-20 DIAGNOSIS — R0602 Shortness of breath: Secondary | ICD-10-CM | POA: Diagnosis not present

## 2020-11-20 DIAGNOSIS — F1120 Opioid dependence, uncomplicated: Secondary | ICD-10-CM | POA: Diagnosis present

## 2020-11-20 DIAGNOSIS — J45901 Unspecified asthma with (acute) exacerbation: Secondary | ICD-10-CM | POA: Diagnosis not present

## 2020-11-20 DIAGNOSIS — F419 Anxiety disorder, unspecified: Secondary | ICD-10-CM | POA: Diagnosis present

## 2020-11-20 DIAGNOSIS — F418 Other specified anxiety disorders: Secondary | ICD-10-CM | POA: Diagnosis not present

## 2020-11-20 DIAGNOSIS — F1010 Alcohol abuse, uncomplicated: Secondary | ICD-10-CM | POA: Diagnosis present

## 2020-11-20 DIAGNOSIS — Z20822 Contact with and (suspected) exposure to covid-19: Secondary | ICD-10-CM | POA: Diagnosis present

## 2020-11-20 DIAGNOSIS — Z6837 Body mass index (BMI) 37.0-37.9, adult: Secondary | ICD-10-CM | POA: Diagnosis not present

## 2020-11-20 DIAGNOSIS — R1084 Generalized abdominal pain: Secondary | ICD-10-CM | POA: Diagnosis not present

## 2020-11-20 DIAGNOSIS — F32A Depression, unspecified: Secondary | ICD-10-CM | POA: Diagnosis present

## 2020-11-20 DIAGNOSIS — R1013 Epigastric pain: Secondary | ICD-10-CM | POA: Diagnosis not present

## 2020-11-20 DIAGNOSIS — R000 Tachycardia, unspecified: Secondary | ICD-10-CM | POA: Diagnosis not present

## 2020-11-20 DIAGNOSIS — K449 Diaphragmatic hernia without obstruction or gangrene: Secondary | ICD-10-CM | POA: Diagnosis not present

## 2020-11-20 DIAGNOSIS — K589 Irritable bowel syndrome without diarrhea: Secondary | ICD-10-CM | POA: Diagnosis present

## 2020-11-20 DIAGNOSIS — J4540 Moderate persistent asthma, uncomplicated: Secondary | ICD-10-CM | POA: Diagnosis not present

## 2020-11-20 DIAGNOSIS — E872 Acidosis, unspecified: Secondary | ICD-10-CM

## 2020-11-20 DIAGNOSIS — D649 Anemia, unspecified: Secondary | ICD-10-CM | POA: Diagnosis not present

## 2020-11-20 DIAGNOSIS — R42 Dizziness and giddiness: Secondary | ICD-10-CM | POA: Diagnosis present

## 2020-11-20 DIAGNOSIS — E876 Hypokalemia: Secondary | ICD-10-CM | POA: Diagnosis not present

## 2020-11-20 DIAGNOSIS — R0603 Acute respiratory distress: Secondary | ICD-10-CM | POA: Diagnosis not present

## 2020-11-20 DIAGNOSIS — R069 Unspecified abnormalities of breathing: Secondary | ICD-10-CM | POA: Diagnosis not present

## 2020-11-20 DIAGNOSIS — K209 Esophagitis, unspecified without bleeding: Secondary | ICD-10-CM | POA: Diagnosis not present

## 2020-11-20 DIAGNOSIS — K2100 Gastro-esophageal reflux disease with esophagitis, without bleeding: Secondary | ICD-10-CM | POA: Diagnosis not present

## 2020-11-20 DIAGNOSIS — F17210 Nicotine dependence, cigarettes, uncomplicated: Secondary | ICD-10-CM | POA: Diagnosis present

## 2020-11-20 DIAGNOSIS — R1111 Vomiting without nausea: Secondary | ICD-10-CM | POA: Diagnosis not present

## 2020-11-20 DIAGNOSIS — D72828 Other elevated white blood cell count: Secondary | ICD-10-CM | POA: Diagnosis present

## 2020-11-20 DIAGNOSIS — G8929 Other chronic pain: Secondary | ICD-10-CM | POA: Diagnosis present

## 2020-11-20 DIAGNOSIS — R110 Nausea: Secondary | ICD-10-CM | POA: Diagnosis not present

## 2020-11-20 LAB — RAPID URINE DRUG SCREEN, HOSP PERFORMED
Amphetamines: NOT DETECTED
Barbiturates: NOT DETECTED
Benzodiazepines: POSITIVE — AB
Cocaine: NOT DETECTED
Opiates: POSITIVE — AB
Tetrahydrocannabinol: NOT DETECTED

## 2020-11-20 LAB — URINALYSIS, COMPLETE (UACMP) WITH MICROSCOPIC
Bilirubin Urine: NEGATIVE
Glucose, UA: NEGATIVE mg/dL
Hgb urine dipstick: NEGATIVE
Ketones, ur: 5 mg/dL — AB
Leukocytes,Ua: NEGATIVE
Nitrite: NEGATIVE
Protein, ur: NEGATIVE mg/dL
Specific Gravity, Urine: 1.017 (ref 1.005–1.030)
pH: 6 (ref 5.0–8.0)

## 2020-11-20 LAB — LACTIC ACID, PLASMA
Lactic Acid, Venous: 0.8 mmol/L (ref 0.5–1.9)
Lactic Acid, Venous: 6.5 mmol/L (ref 0.5–1.9)
Lactic Acid, Venous: >11 mmol/L (ref 0.5–1.9)

## 2020-11-20 LAB — BASIC METABOLIC PANEL
Anion gap: 16 — ABNORMAL HIGH (ref 5–15)
BUN: 6 mg/dL (ref 6–20)
CO2: 18 mmol/L — ABNORMAL LOW (ref 22–32)
Calcium: 9.6 mg/dL (ref 8.9–10.3)
Chloride: 106 mmol/L (ref 98–111)
Creatinine, Ser: 0.86 mg/dL (ref 0.44–1.00)
GFR, Estimated: >60 mL/min (ref >60)
Glucose, Bld: 132 mg/dL — ABNORMAL HIGH (ref 70–99)
Potassium: 3 mmol/L — ABNORMAL LOW (ref 3.5–5.1)
Sodium: 140 mmol/L (ref 135–145)

## 2020-11-20 LAB — TROPONIN I (HIGH SENSITIVITY): Troponin I (High Sensitivity): 7 ng/L (ref <18)

## 2020-11-20 LAB — D-DIMER, QUANTITATIVE: D-Dimer, Quant: <0.27 ug/mL-FEU (ref 0.00–0.50)

## 2020-11-20 LAB — TSH: TSH: 0.611 u[IU]/mL (ref 0.350–4.500)

## 2020-11-20 MED ORDER — DICYCLOMINE HCL 20 MG PO TABS
20.0000 mg | ORAL_TABLET | Freq: Two times a day (BID) | ORAL | Status: DC
  Administered 2020-11-20 – 2020-11-28 (×17): 20 mg via ORAL
  Filled 2020-11-20 (×17): qty 1

## 2020-11-20 MED ORDER — BUDESONIDE 0.25 MG/2ML IN SUSP
0.2500 mg | Freq: Two times a day (BID) | RESPIRATORY_TRACT | Status: DC
  Administered 2020-11-20 – 2020-11-28 (×15): 0.25 mg via RESPIRATORY_TRACT
  Filled 2020-11-20 (×16): qty 2

## 2020-11-20 MED ORDER — ACETAMINOPHEN 325 MG PO TABS
650.0000 mg | ORAL_TABLET | Freq: Four times a day (QID) | ORAL | Status: DC | PRN
  Administered 2020-11-23 – 2020-11-28 (×5): 650 mg via ORAL
  Filled 2020-11-20 (×8): qty 2

## 2020-11-20 MED ORDER — ONDANSETRON HCL 4 MG/2ML IJ SOLN
4.0000 mg | Freq: Four times a day (QID) | INTRAMUSCULAR | Status: DC | PRN
  Administered 2020-11-20 – 2020-11-25 (×8): 4 mg via INTRAVENOUS
  Filled 2020-11-20 (×8): qty 2

## 2020-11-20 MED ORDER — SCOPOLAMINE 1 MG/3DAYS TD PT72
1.0000 | MEDICATED_PATCH | TRANSDERMAL | Status: DC
  Administered 2020-11-21 – 2020-11-26 (×3): 1.5 mg via TRANSDERMAL
  Filled 2020-11-20 (×5): qty 1

## 2020-11-20 MED ORDER — PANTOPRAZOLE SODIUM 40 MG PO TBEC
40.0000 mg | DELAYED_RELEASE_TABLET | Freq: Two times a day (BID) | ORAL | Status: DC
  Administered 2020-11-20 – 2020-11-22 (×4): 40 mg via ORAL
  Filled 2020-11-20 (×5): qty 1

## 2020-11-20 MED ORDER — OXYCODONE-ACETAMINOPHEN 5-325 MG PO TABS
1.0000 | ORAL_TABLET | Freq: Four times a day (QID) | ORAL | Status: DC | PRN
  Administered 2020-11-20 – 2020-11-28 (×26): 1 via ORAL
  Filled 2020-11-20 (×26): qty 1

## 2020-11-20 MED ORDER — LEVALBUTEROL HCL 0.63 MG/3ML IN NEBU
0.6300 mg | INHALATION_SOLUTION | Freq: Two times a day (BID) | RESPIRATORY_TRACT | Status: DC
  Administered 2020-11-21 – 2020-11-23 (×6): 0.63 mg via RESPIRATORY_TRACT
  Filled 2020-11-20 (×6): qty 3

## 2020-11-20 MED ORDER — POTASSIUM CHLORIDE CRYS ER 20 MEQ PO TBCR
40.0000 meq | EXTENDED_RELEASE_TABLET | Freq: Once | ORAL | Status: AC
  Administered 2020-11-20: 40 meq via ORAL
  Filled 2020-11-20: qty 2

## 2020-11-20 MED ORDER — NICOTINE 14 MG/24HR TD PT24
14.0000 mg | MEDICATED_PATCH | Freq: Every day | TRANSDERMAL | Status: DC
  Administered 2020-11-20 – 2020-11-28 (×10): 14 mg via TRANSDERMAL
  Filled 2020-11-20 (×10): qty 1

## 2020-11-20 MED ORDER — LORAZEPAM 2 MG/ML IJ SOLN
1.0000 mg | Freq: Four times a day (QID) | INTRAMUSCULAR | Status: DC | PRN
  Administered 2020-11-20: 1 mg via INTRAVENOUS
  Filled 2020-11-20: qty 1

## 2020-11-20 MED ORDER — LEVALBUTEROL HCL 0.63 MG/3ML IN NEBU
0.6300 mg | INHALATION_SOLUTION | Freq: Four times a day (QID) | RESPIRATORY_TRACT | Status: DC
  Administered 2020-11-20 (×3): 0.63 mg via RESPIRATORY_TRACT
  Filled 2020-11-20 (×3): qty 3

## 2020-11-20 MED ORDER — VALACYCLOVIR HCL 500 MG PO TABS: 1000.0000 mg | ORAL_TABLET | Freq: Two times a day (BID) | ORAL | Status: DC | PRN

## 2020-11-20 MED ORDER — KETOROLAC TROMETHAMINE 15 MG/ML IJ SOLN
INTRAMUSCULAR | Status: AC
  Administered 2020-11-20: 15 mg via INTRAVENOUS
  Filled 2020-11-20: qty 1

## 2020-11-20 MED ORDER — SODIUM CHLORIDE 0.9 % IV SOLN: INTRAVENOUS | Status: AC

## 2020-11-20 MED ORDER — ZOLPIDEM TARTRATE 5 MG PO TABS
5.0000 mg | ORAL_TABLET | Freq: Every evening | ORAL | Status: DC | PRN
  Administered 2020-11-20 – 2020-11-27 (×8): 5 mg via ORAL
  Filled 2020-11-20 (×8): qty 1

## 2020-11-20 MED ORDER — SODIUM CHLORIDE 0.9 % IV SOLN
12.5000 mg | Freq: Four times a day (QID) | INTRAVENOUS | Status: DC | PRN
  Administered 2020-11-20 – 2020-11-26 (×7): 12.5 mg via INTRAVENOUS
  Filled 2020-11-20 (×7): qty 0.5

## 2020-11-20 MED ORDER — LORAZEPAM 2 MG/ML IJ SOLN
1.0000 mg | INTRAMUSCULAR | Status: DC | PRN
Start: 2020-11-20 — End: 2020-11-20

## 2020-11-20 MED ORDER — ONDANSETRON 4 MG PO TBDP
4.0000 mg | ORAL_TABLET | Freq: Three times a day (TID) | ORAL | Status: DC | PRN
  Administered 2020-11-20 – 2020-11-27 (×6): 4 mg via ORAL
  Filled 2020-11-20 (×6): qty 1

## 2020-11-20 MED ORDER — KETOROLAC TROMETHAMINE 15 MG/ML IJ SOLN: 15.0000 mg | Freq: Once | INTRAMUSCULAR | Status: AC | PRN

## 2020-11-20 MED ORDER — PREDNISONE 20 MG PO TABS
40.0000 mg | ORAL_TABLET | Freq: Every day | ORAL | Status: DC
  Administered 2020-11-20 – 2020-11-22 (×3): 40 mg via ORAL
  Filled 2020-11-20: qty 4
  Filled 2020-11-20 (×2): qty 2

## 2020-11-20 MED ORDER — IPRATROPIUM BROMIDE 0.02 % IN SOLN
0.5000 mg | Freq: Four times a day (QID) | RESPIRATORY_TRACT | Status: DC
  Administered 2020-11-20 (×3): 0.5 mg via RESPIRATORY_TRACT
  Filled 2020-11-20 (×3): qty 2.5

## 2020-11-20 MED ORDER — LORAZEPAM 2 MG/ML IJ SOLN
1.0000 mg | Freq: Once | INTRAMUSCULAR | Status: AC
  Administered 2020-11-20: 1 mg via INTRAVENOUS
  Filled 2020-11-20: qty 1

## 2020-11-20 MED ORDER — CALCIUM CARBONATE ANTACID 500 MG PO CHEW
1.0000 | CHEWABLE_TABLET | ORAL | Status: DC | PRN
  Administered 2020-11-24 – 2020-11-27 (×5): 200 mg via ORAL
  Filled 2020-11-20 (×5): qty 1

## 2020-11-20 MED ORDER — IPRATROPIUM BROMIDE 0.02 % IN SOLN
0.5000 mg | Freq: Two times a day (BID) | RESPIRATORY_TRACT | Status: DC
  Administered 2020-11-21 – 2020-11-23 (×6): 0.5 mg via RESPIRATORY_TRACT
  Filled 2020-11-20 (×6): qty 2.5

## 2020-11-20 MED ORDER — KETOROLAC TROMETHAMINE 15 MG/ML IJ SOLN
15.0000 mg | Freq: Three times a day (TID) | INTRAMUSCULAR | Status: AC | PRN
  Administered 2020-11-20 – 2020-11-22 (×5): 15 mg via INTRAVENOUS
  Filled 2020-11-20 (×8): qty 1

## 2020-11-20 MED ORDER — ALPRAZOLAM 0.5 MG PO TABS
2.0000 mg | ORAL_TABLET | Freq: Three times a day (TID) | ORAL | Status: DC | PRN
  Administered 2020-11-20 – 2020-11-28 (×23): 2 mg via ORAL
  Filled 2020-11-20 (×21): qty 4
  Filled 2020-11-20: qty 8
  Filled 2020-11-20: qty 4

## 2020-11-20 MED ORDER — IPRATROPIUM-ALBUTEROL 0.5-2.5 (3) MG/3ML IN SOLN
3.0000 mL | Freq: Three times a day (TID) | RESPIRATORY_TRACT | Status: DC | PRN
  Administered 2020-11-21: 3 mL via RESPIRATORY_TRACT
  Filled 2020-11-20 (×2): qty 3

## 2020-11-20 NOTE — ED Notes (Signed)
Upon rounding on pt, pt complained on IV site pain. IV site swollen so IV removed.

## 2020-11-20 NOTE — Progress Notes (Addendum)
Same day note  Patient seen and examined at bedside.  Patient was admitted to the hospital for shortness of breath  At the time of my evaluation, patient complains of abdominal pain.  Patient does have history of chronic abdominal pain and opiate abuse.  Physical examination reveals nonspecific abdominal pain.  Decreased breath sounds bilaterally.  Laboratory data and imaging was reviewed  Assessment and Plan.  Acute hypoxemic respiratory failure secondary to asthma exacerbation.  Was not on Advair for a month.  Initially was on BiPAP.  Currently on 2 and half liters of oxygen.  Continue steroids Xopenex Pulmicort nebs oxygen.  Lactate was slightly elevated likely secondary to increased work of breathing.  This has improved at this time.  D-dimer was negative.  Hypokalemia.  We will replenish.  Check levels in a.m.  Chronic abdominal pain nausea vomiting.  Disproportionate pain.  Not present when the patient is unattended.  CT head scan was negative.  Beta-hCG negative, lipase negative.  Has a reducible periumbilical hernia.  History of opiate use and chronic abdominal pain.  We will try to avoid strong narcotics.   continue Toradol.  Patient states that he was on Percocet at home but is not on our medication list.  High narcotic seeking tendencies.  Supraventricular tachycardia.  Was cardioverted enroute to the hospital.  Currently in sinus rhythm.  Magnesium within normal range.  Continue to replenish potassium.  Reactive leukocytosis.  No signs of infection so far.   Urine analysis pending.  Elevated lactate.  Resolved after IV fluid hydration and oxygenation.  No Charge  Signed,  Tenny Craw, MD [facility]

## 2020-11-20 NOTE — Progress Notes (Signed)
NEW ADMISSION NOTE New Admission Note:   Arrival Method: stretcher Mental Orientation: A & O X3 Telemetry: 5M06 Assessment: Completed Skin: intact. Bruising bilateral arms, redness chest and back IV: Right upper arm  Pain: 10/10 Tubes: none Safety Measures: Safety Fall Prevention Plan has been given, discussed and signed Admission: Completed [facility] Orientation: Patient has been orientated to the room, unit and staff.  Family: none at bedside   Orders have been reviewed and implemented. Will continue to monitor the patient. Call light has been placed within reach and bed alarm has been activated.   Roxann Vierra S Avonell Lenig, RN

## 2020-11-20 NOTE — Progress Notes (Signed)
Placed patient on 2lpm nasal cannula.

## 2020-11-20 NOTE — H&P (Addendum)
History and Physical    Vicki Moore ERX:[id number] DOB: 24-Jun-1980 DOA: 11/19/2020  PCP: [name], MD Patient coming from: Home  Chief Complaint: Shortness of breath  HPI: Vicki Moore is a 40 y.o. female with medical history significant of asthma, depression, anxiety, chronic abdominal pain, opiate abuse. Patient was seen in the emergency department yesterday with abdominal pain along with wheezing and shortness of breath.  She had CT imaging of her abdomen pelvis along with labs, chest x-ray done.  She was given nebulizer treatments and seemed to clinically improve and was discharged from the ED. EMS called out again for respiratory distress.  She was satting 85% on room air.  She was given 2 albuterol's and a DuoNeb treatment, started on CPAP.  In route to the hospital patient went into SVT with heart rate in the 170s for around 2 minutes and had to be cardioverted.  In the ED, patient was placed on BiPAP initially.  Persistently tachycardic.  Labs showing WBC 11.9, hemoglobin 11.0 (at baseline), platelet count 439.  Sodium 138, potassium 3.4, chloride 102, bicarb 9, anion gap 27, BUN 7, creatinine 1.0, glucose 163.  Transaminases mildly elevated, no significant change compared to prior labs.  No significant elevation of alkaline phosphatase.  T bili normal.  Lipase normal.  BNP pending.  High-sensitivity troponin negative x2.  Magnesium within normal range.  Lactic acid >11.0.  VBG with pH 7.32.  BMP repeated and showing sodium 140, potassium 3.0, chloride 106, bicarb 18, anion gap 16, BUN 6, creatinine 0.8, glucose 132.  D-dimer negative.  Lactic acid improved to 6.5 after 1 L IV fluid.  Chest x-ray showing no active disease.  CT abdomen pelvis done during ED visit earlier yesterday did not show any acute findings.  Patient states she ran out of her Advair a month ago.  Since then she is only using albuterol as needed.  For the past week she has had dyspnea and wheezing, became much  worse yesterday.  Also had chest tightness.  Also complaining of periumbilical abdominal pain, nausea, and vomiting for the past 2 days.  States she had a bowel movement yesterday which was slightly bloody.    Review of Systems:  All systems reviewed and apart from history of presenting illness, are negative.  Past Medical History:  Diagnosis Date   Anxiety    Asthma    Asthma    Depression    GERD (gastroesophageal reflux disease)     Past Surgical History:  Procedure Laterality Date   CHOLECYSTECTOMY N/A 12/08/2019   Procedure: LAPAROSCOPIC CHOLECYSTECTOMY;  Surgeon: Franky Macho, MD;  Location: [facility];  Service: General;  Laterality: N/A;     reports that she has been smoking cigarettes. She has a 2.50 pack-year smoking history. She has never used smokeless tobacco. She reports current alcohol use. She reports previous drug use.  Allergies  Allergen Reactions   Peanut-Containing Drug Products Hives and Shortness Of Breath   Penicillins Anaphylaxis, Hives, Nausea And Vomiting and Rash          No family history on file.  Prior to Admission medications   Medication Sig Start Date End Date Taking? Authorizing Provider  acetaminophen (TYLENOL) 325 MG tablet Take 2 tablets (650 mg total) by mouth every 6 (six) hours as needed. 11/19/20   Sloan Leiter, DO  albuterol (PROVENTIL HFA;VENTOLIN HFA) 108 (90 BASE) MCG/ACT inhaler Inhale 2 puffs into the lungs every 6 (six) hours as needed for wheezing or shortness  of breath. For shortness of breath    [provider]  alprazolam Prudy Feeler) 2 MG tablet Take 2 mg by mouth in the morning, at noon, and at bedtime.    [provider]  Calcium Carbonate Antacid (TUMS PO) Take 1-2 tablets by mouth every hour as needed (acid reflux/heartburn).    [provider]  dicyclomine (BENTYL) 20 MG tablet Take 1 tablet (20 mg total) by mouth 2 (two) times daily. 11/19/20   Sloan Leiter, DO  fluticasone-salmeterol (ADVAIR DISKUS)  500-50 MCG/ACT AEPB Inhale 1 puff into the lungs 2 (two) times daily. 11/12/20   Noralee Stain, DO  ipratropium-albuterol (DUONEB) 0.5-2.5 (3) MG/3ML SOLN Take 3 mLs by nebulization 3 (three) times daily as needed for wheezing or shortness of breath. 10/11/19   [provider]  nicotine (NICODERM CQ - DOSED IN MG/24 HOURS) 14 mg/24hr patch Place 1 patch (14 mg total) onto the skin daily. Patient not taking: No sig reported 08/07/20   Pokhrel, Rebekah Chesterfield, MD  ondansetron (ZOFRAN ODT) 4 MG disintegrating tablet Take 1 tablet (4 mg total) by mouth every 8 (eight) hours as needed for nausea or vomiting. 11/19/20   Sloan Leiter, DO  pantoprazole (PROTONIX) 40 MG tablet Take 1 tablet (40 mg total) by mouth 2 (two) times daily before a meal. 11/15/19   Amin, Loura Halt, MD  tetrahydrozoline-zinc (VISINE-AC) 0.05-0.25 % ophthalmic solution Place 1 drop into both eyes 3 (three) times daily as needed (dry eyes).    [provider]  valACYclovir (VALTREX) 1000 MG tablet Take 1,000 mg by mouth 2 (two) times daily as needed (breakouts). 02/09/20   [provider]    Physical Exam: Vitals:   11/20/20 0400 11/20/20 0430 11/20/20 0530 11/20/20 0545  BP: 127/75 126/69 117/62 96/72  Pulse: (!) 121 (!) 109 (!) 122 (!) 108  Resp: (!) Temp:      TempSrc:      SpO2: 100% 100% 100% 98%    Physical Exam Constitutional:      General: She is not in acute distress. HENT:     Head: Normocephalic and atraumatic.  Eyes:     Extraocular Movements: Extraocular movements intact.     Conjunctiva/sclera: Conjunctivae normal.  Cardiovascular:     Rate and Rhythm: Normal rate and regular rhythm.     Pulses: Normal pulses.  Pulmonary:     Effort: Pulmonary effort is normal. No respiratory distress.     Breath sounds: Normal breath sounds. No wheezing or rales.  Abdominal:     General: Bowel sounds are normal. There is no distension.     Palpations: Abdomen is soft.     Tenderness:  There is no guarding or rebound.     Comments: Generalized tenderness to palpation Small periumbilical hernia reducible on exam  Musculoskeletal:        General: No swelling or tenderness.     Cervical back: Normal range of motion and neck supple.  Skin:    General: Skin is warm and dry.  Neurological:     General: No focal deficit present.     Mental Status: She is alert and oriented to person, place, and time.     Labs on Admission: I have personally reviewed following labs and imaging studies  CBC: Recent Labs  Lab 11/19/20 0936 11/19/20 2125 11/19/20 2256  WBC 6.6 11.9*  --   NEUTROABS  --  9.7*  --   HGB 9.9* 11.0* 11.9*  HCT 31.6* 37.3 35.0*  MCV 82.1 86.5  --   PLT 257 439*  --    Basic Metabolic Panel: Recent Labs  Lab 11/19/20 0936 11/19/20 2125 11/19/20 2256 11/19/20 2318  NA 140 138 140 140  K 3.3* 3.4* 3.2* 3.0*  CL 102 102  --  106  CO2 26 9*  --  18*  GLUCOSE 101* 163*  --  132*  BUN 9 7  --  6  CREATININE 0.64 1.09*  --  0.86  CALCIUM 9.8 10.0  --  9.6  MG  --  1.7  --   --    GFR: Estimated Creatinine Clearance: 87.6 mL/min (by C-G formula based on SCr of 0.86 mg/dL). Liver Function Tests: Recent Labs  Lab 11/19/20 0936 11/19/20 2125  AST 81* 85*  ALT 83* 84*  ALKPHOS 120 128*  BILITOT 0.8 0.6  PROT 7.0 7.8  ALBUMIN 3.5 3.9   Recent Labs  Lab 11/19/20 0936 11/19/20 2125  LIPASE 55* 43   No results for input(s): AMMONIA in the last 168 hours. Coagulation Profile: No results for input(s): INR, PROTIME in the last 168 hours. Cardiac Enzymes: No results for input(s): CKTOTAL, CKMB, CKMBINDEX, TROPONINI in the last 168 hours. BNP (last 3 results) No results for input(s): PROBNP in the last 8760 hours. HbA1C: No results for input(s): HGBA1C in the last 72 hours. CBG: No results for input(s): GLUCAP in the last 168 hours. Lipid Profile: No results for input(s): CHOL, HDL, LDLCALC, TRIG, CHOLHDL, LDLDIRECT in the last 72  hours. Thyroid Function Tests: No results for input(s): TSH, T4TOTAL, FREET4, T3FREE, THYROIDAB in the last 72 hours. Anemia Panel: No results for input(s): VITAMINB12, FOLATE, FERRITIN, TIBC, IRON, RETICCTPCT in the last 72 hours. Urine analysis:    Component Value Date/Time   COLORURINE STRAW (A) 01/24/2020 0205   APPEARANCEUR CLEAR 01/24/2020 0205   APPEARANCEUR Clear 01/10/2020 1349   LABSPEC 1.010 01/24/2020 0205   PHURINE 7.0 01/24/2020 0205   GLUCOSEU NEGATIVE 01/24/2020 0205   HGBUR NEGATIVE 01/24/2020 0205   BILIRUBINUR NEGATIVE 01/24/2020 0205   BILIRUBINUR Negative 01/10/2020 1349   KETONESUR NEGATIVE 01/24/2020 0205   PROTEINUR NEGATIVE 01/24/2020 0205   UROBILINOGEN 0.2 08/15/2010 2057   NITRITE NEGATIVE 01/24/2020 0205   LEUKOCYTESUR NEGATIVE 01/24/2020 0205    Radiological Exams on Admission: CT ABDOMEN PELVIS W CONTRAST  Result Date: 11/19/2020 CLINICAL DATA:  Abdominal pain, nonlocalized periumbilical pain EXAM: CT ABDOMEN AND PELVIS WITH CONTRAST TECHNIQUE: Multidetector CT imaging of the abdomen and pelvis was performed using the standard protocol following bolus administration of intravenous contrast. CONTRAST:  OMNIPAQUE IOHEXOL 300 MG/ML  SOLN COMPARISON:  CT abdomen/pelvis 04/23/2020 FINDINGS: Lower chest: The lung bases are clear. The imaged heart is unremarkable. Hepatobiliary: The liver is diffusely hypoattenuating consistent with fatty infiltration, similar to priors. The gallbladder is surgically absent. There is no biliary ductal dilatation. Pancreas: Normal. Spleen: Normal. Adrenals/Urinary Tract: The adrenals are unremarkable. A hypodense lesion in the left mid kidney present since 2011 likely reflects a cyst. There are no other lesions. There are no calculi. There is no hydronephrosis or hydroureter. The bladder is unremarkable. Stomach/Bowel: There is a small hiatal hernia, unchanged. The stomach is otherwise unremarkable. There is no evidence of bowel  obstruction. There is no abnormal bowel wall thickening or inflammatory change. The appendix is not definitively identified, but there is no pericecal inflammatory change. Vascular/Lymphatic: The abdominal aorta is nonaneurysmal. The main portal and splenic veins are patent. There  is no abdominal or pelvic lymphadenopathy. Reproductive: The uterus and adnexa are unremarkable. Other: There is a fat containing umbilical hernia, unchanged. There is no ascites or free air. Musculoskeletal: There is no acute osseous abnormality. IMPRESSION: 1. No acute findings in the abdomen or pelvis to explain the patient's symptoms. 2. Fatty infiltration of the liver, unchanged. 3. Small hiatal hernia, unchanged. Electronically Signed   By: Lesia HausenPeter  Noone MD   On: 11/19/2020 12:17   DG Chest Portable 1 View  Result Date: 11/19/2020 CLINICAL DATA:  Shortness of breath EXAM: PORTABLE CHEST 1 VIEW COMPARISON:  Film from earlier in the same day. FINDINGS: Cardiac shadow is stable. The lungs are clear bilaterally. No focal infiltrate or effusion is noted. No bony abnormality is seen. IMPRESSION: No active disease. Electronically Signed   By: Alcide CleverMark  Lukens M.D.   On: 11/19/2020 21:08   DG Chest Portable 1 View  Result Date: 11/19/2020 CLINICAL DATA:  Wheezing, asthma, dyspnea. EXAM: PORTABLE CHEST 1 VIEW COMPARISON:  11/10/2020 FINDINGS: The cardiomediastinal silhouette is unchanged with normal heart size. The lungs are mildly hypoinflated with mild accentuation of the interstitial markings. No overt pulmonary edema, confluent airspace opacity, sizeable pleural effusion, or pneumothorax is identified. No acute osseous abnormality is seen. IMPRESSION: No active disease. Electronically Signed   By: Sebastian AcheAllen  Grady M.D.   On: 11/19/2020 11:07    EKG: Independently reviewed.  Sinus tachycardia, no acute ischemic changes.  Borderline QT prolongation.  Assessment/Plan Principal Problem:   Acute asthma exacerbation Active Problems:    Acute hypoxemic respiratory failure (HCC)   Abdominal pain   SVT (supraventricular tachycardia) (HCC)   Lactic acidosis   Acute hypoxemic respiratory failure secondary to acute asthma exacerbation Likely secondary to medication nonadherence, ran out of Advair a month ago.  Initially satting 85% on room air with EMS.  Initially required BiPAP in the ED but currently satting in the upper 90s on 2.5 L supplemental oxygen with no respiratory distress.  Not wheezing at present. -Start prednisone 40 mg daily, Xopenex neb every 6 hours, Atrovent neb every 6 hours, Pulmicort neb twice daily. Continue supplemental oxygen, wean as tolerated.  Abdominal pain, nausea, vomiting Patient is complaining of periumbilical abdominal pain, nausea, and vomiting.  Transaminases mildly elevated, no significant change compared to prior labs.  No significant elevation of alkaline phosphatase.  T bili normal.  Lipase normal. Beta-hCG negative.  She has a small reducible periumbilical hernia.  CT showing no acute findings.  Complained of bloody stool yesterday but hemoglobin stable.  Patient was sleeping very comfortably when I entered the room but then woke up and started complaining of severe abdominal pain and requesting pain medications. -One-time dose of Toradol ordered.  Avoid QT prolonging antiemetics.  Ativan as needed for nausea/vomiting, scopolamine patch.  Check FOBT, UDS, UA.  SVT Patient went into SVT in route to the hospital with rate in the 170s and had to be cardioverted.  Currently in sinus rhythm but continues to be slightly tachycardic.  Magnesium level within normal range.  PE less likely as D-dimer normal. -IV fluid hydration.  Check TSH level.  Avoid albuterol.  Mild leukocytosis Likely reactive.  Imaging of chest and abdomen showing no acute infectious process.  Not febrile. -Check UA, repeat CBC in a.m.  Lactic acidosis Sepsis less likely as no acute infectious process identified on imaging of  chest and abdomen.  WBC count only mildly elevated.  Not febrile.  Lactic acid level improved significantly after fluid bolus. -IV  fluid hydration, trend lactate Addendum 8/10 at 6:11 AM: Blood cultures ordered.  High anion gap metabolic acidosis Likely due to lactic acidosis. -IV fluid hydration, continue to monitor  Hypokalemia Magnesium within normal range. -Replace potassium, continue to monitor electrolytes  DVT prophylaxis: SCDs at this time, FOBT pending Code Status: Full code Family Communication: No family available at this time. Disposition Plan: Status is: Inpatient  Remains inpatient appropriate because:Inpatient level of care appropriate due to severity of illness  Dispo: The patient is from: Home              Anticipated d/c is to: Home              Patient currently is not medically stable to d/c.   Difficult to place patient No  Level of care: Level of care: Progressive  The medical decision making on this patient was of high complexity and the patient is at high risk for clinical deterioration, therefore this is a level 3 visit.  John Giovanni MD [facility]  If 7PM-7AM, please contact night-coverage [email or web address]  11/20/2020, 6:10 AM

## 2020-11-20 NOTE — Progress Notes (Signed)
Attempted to wean patient from bipap numerous time but patient refuses for bipap to be removed.

## 2020-11-21 LAB — CBC
HCT: 31.7 % — ABNORMAL LOW (ref 36.0–46.0)
Hemoglobin: 9.8 g/dL — ABNORMAL LOW (ref 12.0–15.0)
MCH: 25.5 pg — ABNORMAL LOW (ref 26.0–34.0)
MCHC: 30.9 g/dL (ref 30.0–36.0)
MCV: 82.6 fL (ref 80.0–100.0)
Platelets: 263 10*3/uL (ref 150–400)
RBC: 3.84 MIL/uL — ABNORMAL LOW (ref 3.87–5.11)
RDW: 20 % — ABNORMAL HIGH (ref 11.5–15.5)
WBC: 9.5 10*3/uL (ref 4.0–10.5)
nRBC: 0 % (ref 0.0–0.2)

## 2020-11-21 LAB — COMPREHENSIVE METABOLIC PANEL
ALT: 79 U/L — ABNORMAL HIGH (ref 0–44)
AST: 54 U/L — ABNORMAL HIGH (ref 15–41)
Albumin: 3.6 g/dL (ref 3.5–5.0)
Alkaline Phosphatase: 105 U/L (ref 38–126)
Anion gap: 11 (ref 5–15)
BUN: 7 mg/dL (ref 6–20)
CO2: 24 mmol/L (ref 22–32)
Calcium: 9.2 mg/dL (ref 8.9–10.3)
Chloride: 103 mmol/L (ref 98–111)
Creatinine, Ser: 0.73 mg/dL (ref 0.44–1.00)
GFR, Estimated: >60 mL/min (ref >60)
Glucose, Bld: 87 mg/dL (ref 70–99)
Potassium: 3.7 mmol/L (ref 3.5–5.1)
Sodium: 138 mmol/L (ref 135–145)
Total Bilirubin: 0.9 mg/dL (ref 0.3–1.2)
Total Protein: 6.8 g/dL (ref 6.5–8.1)

## 2020-11-21 MED ORDER — LIDOCAINE 5 % EX PTCH
1.0000 | MEDICATED_PATCH | Freq: Every day | CUTANEOUS | Status: AC
  Filled 2020-11-21: qty 1

## 2020-11-21 MED ORDER — KETOROLAC TROMETHAMINE 15 MG/ML IJ SOLN
15.0000 mg | Freq: Once | INTRAMUSCULAR | Status: AC | PRN
  Administered 2020-11-22: 15 mg via INTRAVENOUS
  Filled 2020-11-21: qty 1

## 2020-11-21 MED ORDER — COVID-19 MRNA VAC-TRIS(PFIZER) 30 MCG/0.3ML IM SUSP
0.3000 mL | Freq: Once | INTRAMUSCULAR | Status: AC
  Filled 2020-11-21: qty 0.3

## 2020-11-21 MED ORDER — POLYETHYLENE GLYCOL 3350 17 G PO PACK: 17.0000 g | PACK | Freq: Every day | ORAL | Status: DC | PRN

## 2020-11-21 NOTE — Progress Notes (Signed)
PROGRESS NOTE  ELONA YINGER QTM:[id number] DOB: 02-Oct-1980 DOA: 11/19/2020 PCP: [name], MD   LOS: 1 day   Brief narrative:  Vicki Moore is a 40 y.o. female with medical history significant of asthma, depression, anxiety, chronic abdominal pain, opiate abuse presented to hospital with wheezing shortness of breath and abdominal pain.  Patient was initially seen in the ED and had felt better so she was discharged home after negative CT scan but then had increasing respiratory distress with pulse ox of 85% on room air so she was brought into the hospital and was started on CPAP.  In the ED patient was initially put on BiPAP and was admitted to hospital for further evaluation and treatment.  Patient stated that she had ran out of her Advair for a month.  Patient was then admitted to the hospital for further evaluation and treatment.  Assessment/Plan:  Principal Problem:   Acute asthma exacerbation Active Problems:   Acute respiratory failure with hypoxia (HCC)   Acute hypoxemic respiratory failure (HCC)   Abdominal pain   SVT (supraventricular tachycardia) (HCC)   Lactic acidosis   Acute hypoxemic respiratory failure secondary to asthma exacerbation.  Was not on Advair for a month.  Initially was on BiPAP.   Continue steroids Xopenex Pulmicort nebs oxygen.  Lactate was slightly elevated likely secondary to increased work of breathing.  This has improved at this time.  D-dimer was negative.  Breathing has improved overall.   Hypokalemia.  Replenished.  Check levels .   Chronic abdominal pain nausea vomiting.  Disproportionate pain.  Not present when the patient is unattended.  CT abdominal/pelvis was negative.  Beta-hCG negative, lipase negative.  Has a reducible periumbilical hernia.  History of opiate use and chronic abdominal pain.  We will try to avoid strong narcotics.   continue Toradol.  Patient states that he was on Percocet at home but is not on our medication list.   High narcotic seeking tendency noted.  Urine drug screen was positive for opiates and benzodiazepines.   Supraventricular tachycardia.  Was cardioverted enroute to the hospital.  Currently in sinus rhythm.  Magnesium within normal range.  We will monitor electrolytes closely.   Reactive leukocytosis.  No signs of infection so far.   Urine analysis negative.   Elevated lactate.  Resolved after IV fluid hydration and oxygenation.  DVT prophylaxis: SCDs Start: 11/20/20 0602    Code Status: Full code  Family Communication: None  Status is: Inpatient  Remains inpatient appropriate because:IV treatments appropriate due to intensity of illness or inability to take PO and Inpatient level of care appropriate due to severity of illness  Dispo: The patient is from: Home              Anticipated d/c is to: Home              Patient currently is not medically stable to d/c.   Difficult to place patient No  Consultants: None  Procedures: None  Anti-infectives:  Valtrex  Anti-infectives (From admission, onward)    Start     Dose/Rate Route Frequency Ordered Stop   11/20/20 1400  valACYclovir (VALTREX) tablet 1,000 mg        1,000 mg Oral 2 times daily PRN 11/20/20 1402        Subjective: Today, patient was seen and examined at bedside.  States that she is very fatigued and weak and not at her baseline.  Required medication to go to sleep yesterday.  Breathing little better.  Objective: Vitals:   11/21/20 0819 11/21/20 1013  BP:  (!) 131/8  Pulse:  88  Resp:  20  Temp:  98.8 F (37.1 C)  SpO2: 96% 98%    Intake/Output Summary (Last 24 hours) at 11/21/2020 1429 Last data filed at 11/21/2020 0952 Gross per 24 hour  Intake 815 ml  Output 200 ml  Net 615 ml   Filed Weights   11/21/20 0533  Weight: 85 kg   Body mass index is 35.41 kg/m.   Physical Exam: GENERAL: Patient is alert awake and oriented. Not in obvious distress.  Obese HENT: No scleral pallor or icterus.  Pupils equally reactive to light. Oral mucosa is moist NECK: is supple, no gross swelling noted. CHEST: Diminished respirations bilaterally.  Mild rhonchi noted. CVS: S1 and S2 heard, no murmur. Regular rate and rhythm.  ABDOMEN: Soft, nonspecific tenderness on palpation, small reducible umbilical hernia, bowel sounds are present. EXTREMITIES: No edema. CNS: Cranial nerves are intact. No focal motor deficits. SKIN: warm and dry without rashes.  Data Review: I have personally reviewed the following laboratory data and studies,  CBC: Recent Labs  Lab 11/19/20 0936 11/19/20 2125 11/19/20 2256 11/21/20 0756  WBC 6.6 11.9*  --  9.5  NEUTROABS  --  9.7*  --   --   HGB 9.9* 11.0* 11.9* 9.8*  HCT 31.6* 37.3 35.0* 31.7*  MCV 82.1 86.5  --  82.6  PLT 257 439*  --  263   Basic Metabolic Panel: Recent Labs  Lab 11/19/20 0936 11/19/20 2125 11/19/20 2256 11/19/20 2318 11/21/20 0756  NA 140 138 140 140 138  K 3.3* 3.4* 3.2* 3.0* 3.7  CL 102 102  --  106 103  CO2 26 9*  --  18* 24  GLUCOSE 101* 163*  --  132* 87  BUN 9 7  --  6 7  CREATININE 0.64 1.09*  --  0.86 0.73  CALCIUM 9.8 10.0  --  9.6 9.2  MG  --  1.7  --   --   --    Liver Function Tests: Recent Labs  Lab 11/19/20 0936 11/19/20 2125 11/21/20 0756  AST 81* 85* 54*  ALT 83* 84* 79*  ALKPHOS 120 128* 105  BILITOT 0.8 0.6 0.9  PROT 7.0 7.8 6.8  ALBUMIN 3.5 3.9 3.6   Recent Labs  Lab 11/19/20 0936 11/19/20 2125  LIPASE 55* 43   No results for input(s): AMMONIA in the last 168 hours. Cardiac Enzymes: No results for input(s): CKTOTAL, CKMB, CKMBINDEX, TROPONINI in the last 168 hours. BNP (last 3 results) Recent Labs    04/16/20 1006  BNP 77.5    ProBNP (last 3 results) No results for input(s): PROBNP in the last 8760 hours.  CBG: No results for input(s): GLUCAP in the last 168 hours. Recent Results (from the past 240 hour(s))  Resp Panel by RT-PCR (Flu A&B, Covid) Nasopharyngeal Swab     Status: None    Collection Time: 11/19/20  9:36 AM   Specimen: Nasopharyngeal Swab; Nasopharyngeal(NP) swabs in vial transport medium  Result Value Ref Range Status   SARS Coronavirus 2 by RT PCR NEGATIVE NEGATIVE Final    Comment: (NOTE) SARS-CoV-2 target nucleic acids are NOT DETECTED.  The SARS-CoV-2 RNA is generally detectable in upper respiratory specimens during the acute phase of infection. The lowest concentration of SARS-CoV-2 viral copies this assay can detect is 138 copies/mL. A negative result does not preclude SARS-Cov-2 infection and should  not be used as the sole basis for treatment or other patient management decisions. A negative result may occur with  improper specimen collection/handling, submission of specimen other than nasopharyngeal swab, presence of viral mutation(s) within the areas targeted by this assay, and inadequate number of viral copies(<138 copies/mL). A negative result must be combined with clinical observations, patient history, and epidemiological information. The expected result is Negative.  Fact Sheet for Patients:  BloggerCourse.com  Fact Sheet for Healthcare Providers:  SeriousBroker.it  This test is no t yet approved or cleared by the [place] FDA and  has been authorized for detection and/or diagnosis of SARS-CoV-2 by FDA under an Emergency Use Authorization (EUA). This EUA will remain  in effect (meaning this test can be used) for the duration of the COVID-19 declaration under Section 564(b)(1) of the Act, 21 U.S.C.section 360bbb-3(b)(1), unless the authorization is terminated  or revoked sooner.       Influenza A by PCR NEGATIVE NEGATIVE Final   Influenza B by PCR NEGATIVE NEGATIVE Final    Comment: (NOTE) The Xpert Xpress SARS-CoV-2/FLU/RSV plus assay is intended as an aid in the diagnosis of influenza from Nasopharyngeal swab specimens and should not be used as a sole basis for treatment.  Nasal washings and aspirates are unacceptable for Xpert Xpress SARS-CoV-2/FLU/RSV testing.  Fact Sheet for Patients: BloggerCourse.com  Fact Sheet for Healthcare Providers: SeriousBroker.it  This test is not yet approved or cleared by the [place] FDA and has been authorized for detection and/or diagnosis of SARS-CoV-2 by FDA under an Emergency Use Authorization (EUA). This EUA will remain in effect (meaning this test can be used) for the duration of the COVID-19 declaration under Section 564(b)(1) of the Act, 21 U.S.C. section 360bbb-3(b)(1), unless the authorization is terminated or revoked.  Performed at [facility], [address], [place], [place] [address]   Culture, blood (routine x 2)     Status: None (Preliminary result)   Collection Time: 11/20/20  9:07 AM   Specimen: BLOOD  Result Value Ref Range Status   Specimen Description BLOOD SITE NOT SPECIFIED  Final   Special Requests   Final    BOTTLES DRAWN AEROBIC ONLY Blood Culture results may not be optimal due to an inadequate volume of blood received in culture bottles   Culture   Final    NO GROWTH 1 DAY Performed at [facility], [address], [place], [place] [address]    Report Status PENDING  Incomplete  Culture, blood (Routine X 2) w Reflex to ID Panel     Status: None (Preliminary result)   Collection Time: 11/21/20  7:57 AM   Specimen: BLOOD  Result Value Ref Range Status   Specimen Description BLOOD RIGHT ANTECUBITAL  Final   Special Requests   Final    BOTTLES DRAWN AEROBIC AND ANAEROBIC Blood Culture adequate volume   Culture   Final    NO GROWTH <12 HOURS Performed at [facility], [address] [place], [place] [address]    Report Status PENDING  Incomplete     Studies: DG Chest Portable 1 View  Result Date: 11/19/2020 CLINICAL DATA:  Shortness of breath EXAM: PORTABLE CHEST 1 VIEW COMPARISON:  Film from earlier in the  same day. FINDINGS: Cardiac shadow is stable. The lungs are clear bilaterally. No focal infiltrate or effusion is noted. No bony abnormality is seen. IMPRESSION: No active disease. Electronically Signed   By: Eulah Pont.D.  On: 11/19/2020 21:08      Joycelyn Das, MD  [facility] 11/21/2020  If 7PM-7AM, please contact night-coverage

## 2020-11-22 ENCOUNTER — Inpatient Hospital Stay (HOSPITAL_COMMUNITY): Payer: Medicaid Other

## 2020-11-22 DIAGNOSIS — R112 Nausea with vomiting, unspecified: Secondary | ICD-10-CM | POA: Diagnosis not present

## 2020-11-22 DIAGNOSIS — R1084 Generalized abdominal pain: Secondary | ICD-10-CM | POA: Diagnosis not present

## 2020-11-22 DIAGNOSIS — R1110 Vomiting, unspecified: Secondary | ICD-10-CM

## 2020-11-22 DIAGNOSIS — J45901 Unspecified asthma with (acute) exacerbation: Secondary | ICD-10-CM

## 2020-11-22 MED ORDER — PANTOPRAZOLE SODIUM 40 MG IV SOLR: 40.0000 mg | Freq: Two times a day (BID) | INTRAVENOUS | Status: DC

## 2020-11-22 MED ORDER — PANTOPRAZOLE SODIUM 40 MG IV SOLR
40.0000 mg | Freq: Two times a day (BID) | INTRAVENOUS | Status: DC
  Administered 2020-11-22 – 2020-11-27 (×11): 40 mg via INTRAVENOUS
  Filled 2020-11-22 (×11): qty 40

## 2020-11-22 MED ORDER — SODIUM CHLORIDE 0.9 % IV SOLN: INTRAVENOUS | Status: AC

## 2020-11-22 NOTE — Progress Notes (Signed)
Echo attempted. Patient nauseous and vomiting. Patient that we attempt echo again at a later time. Will attempt again as time and schedule permit.

## 2020-11-22 NOTE — Progress Notes (Signed)
PROGRESS NOTE  Vicki Moore BMW:[id number] DOB: 08-05-1980 DOA: 11/19/2020 PCP: [name], MD   LOS: 2 days   Brief narrative:  Vicki Moore is a 40 y.o. female with medical history significant of asthma, depression, anxiety, chronic abdominal pain, opiate abuse presented to hospital with wheezing shortness of breath and abdominal pain.  Patient was initially seen in the ED and had felt better so she was discharged home after negative CT scan but then had increasing respiratory distress with pulse ox of 85% on room air so she was brought into the hospital and was started on CPAP.  In the ED, patient was initially put on BiPAP and was admitted to hospital for further evaluation and treatment.  Patient stated that she had ran out of her Advair for a month.  Patient was then admitted to the hospital for further evaluation and treatment.  Assessment/Plan:  Principal Problem:   Acute asthma exacerbation Active Problems:   Acute respiratory failure with hypoxia (HCC)   Acute hypoxemic respiratory failure (HCC)   Abdominal pain   SVT (supraventricular tachycardia) (HCC)   Lactic acidosis   Acute hypoxemic respiratory failure secondary to asthma exacerbation.  Was not on Advair for a month.  Initially was on BiPAP.   Continue steroids Xopenex Pulmicort nebs oxygen.  Lactate was slightly elevated likely secondary to increased work of breathing.  This has improved at this time.  D-dimer was negative.  Breathing has improved overall.  Currently off oxygen at this time.  Continue bronchodilators.  Nausea vomiting persistent abdominal pain.  Patient has had cholecystectomy in the past for abdominal pain.  GI has been consulted for further opinion since patient had been having frequent episodes of vomiting and poor intolerance.  Continue Protonix.  We will follow GI recommendations.  Patient has disproportionate pain.  Care of the abdomen and pelvis was negative.Beta-hCG negative, lipase  negative.  Has a reducible periumbilical hernia.  History of opiate use High narcotic seeking tendency noted.  Urine drug screen was positive for opiates and benzodiazepines.   Hypokalemia.  Improved after replacement..   Supraventricular tachycardia.  Was cardioverted enroute to the hospital.  Currently in sinus rhythm.  Magnesium within normal range.  We will monitor electrolytes closely.  Check 2D echocardiogram.  Add TSH.   Reactive leukocytosis.  No signs of infection so far.   Urine analysis negative.  WBC at 9.5.   Elevated lactate.  Resolved after IV fluid hydration and oxygenation.  DVT prophylaxis: SCDs Start: 11/20/20 0602   Code Status: Full code  Family Communication: Spoke with the patient's mom on the phone.  Status is: Inpatient  Remains inpatient appropriate because:IV treatments appropriate due to intensity of illness or inability to take PO and Inpatient level of care appropriate due to severity of illness  Dispo: The patient is from: Home              Anticipated d/c is to: Home              Patient currently is not medically stable to d/c.   Difficult to place patient No  Consultants: GI  Procedures: None  Anti-infectives:  Valtrex  Anti-infectives (From admission, onward)    Start     Dose/Rate Route Frequency Ordered Stop   11/20/20 1400  valACYclovir (VALTREX) tablet 1,000 mg        1,000 mg Oral 2 times daily PRN 11/20/20 1402        Subjective: Today, patient was seen and  examined at bedside.  He states that her pain is better and has been having frequent episodes of vomiting.  Has not been able to tolerate any p.o. diet.  Objective: Vitals:   11/22/20 0730 11/22/20 0952  BP:  117/78  Pulse:  (!) 56  Resp:  18  Temp:  97.9 F (36.6 C)  SpO2: 95% 94%    Intake/Output Summary (Last 24 hours) at 11/22/2020 1506 Last data filed at 11/22/2020 1300 Gross per 24 hour  Intake 1597 ml  Output --  Net 1597 ml    Filed Weights   11/21/20  0533 11/21/20 2254  Weight: 85 kg 87 kg   Body mass index is 36.24 kg/m.   Physical Exam:  GENERAL: Patient is alert awake and oriented.  Anxious,   Obese HENT: No scleral pallor or icterus. Pupils equally reactive to light. Oral mucosa is moist NECK: is supple, no gross swelling noted. CHEST: Diminished respirations bilaterally.  No wheezes or rhonchi. CVS: S1 and S2 heard, no murmur. Regular rate and rhythm.  ABDOMEN: Soft, nonspecific tenderness on palpation, small reducible umbilical hernia, bowel sounds are present. EXTREMITIES: No edema. CNS: Cranial nerves are intact. No focal motor deficits. SKIN: warm and dry without rashes.  Data Review: I have personally reviewed the following laboratory data and studies,  CBC: Recent Labs  Lab 11/19/20 0936 11/19/20 2125 11/19/20 2256 11/21/20 0756  WBC 6.6 11.9*  --  9.5  NEUTROABS  --  9.7*  --   --   HGB 9.9* 11.0* 11.9* 9.8*  HCT 31.6* 37.3 35.0* 31.7*  MCV 82.1 86.5  --  82.6  PLT 257 439*  --  263    Basic Metabolic Panel: Recent Labs  Lab 11/19/20 0936 11/19/20 2125 11/19/20 2256 11/19/20 2318 11/21/20 0756  NA 140 138 140 140 138  K 3.3* 3.4* 3.2* 3.0* 3.7  CL 102 102  --  106 103  CO2 26 9*  --  18* 24  GLUCOSE 101* 163*  --  132* 87  BUN 9 7  --  6 7  CREATININE 0.64 1.09*  --  0.86 0.73  CALCIUM 9.8 10.0  --  9.6 9.2  MG  --  1.7  --   --   --     Liver Function Tests: Recent Labs  Lab 11/19/20 0936 11/19/20 2125 11/21/20 0756  AST 81* 85* 54*  ALT 83* 84* 79*  ALKPHOS 120 128* 105  BILITOT 0.8 0.6 0.9  PROT 7.0 7.8 6.8  ALBUMIN 3.5 3.9 3.6    Recent Labs  Lab 11/19/20 0936 11/19/20 2125  LIPASE 55* 43    No results for input(s): AMMONIA in the last 168 hours. Cardiac Enzymes: No results for input(s): CKTOTAL, CKMB, CKMBINDEX, TROPONINI in the last 168 hours. BNP (last 3 results) Recent Labs    04/16/20 1006  BNP 77.5     ProBNP (last 3 results) No results for input(s):  PROBNP in the last 8760 hours.  CBG: No results for input(s): GLUCAP in the last 168 hours. Recent Results (from the past 240 hour(s))  Resp Panel by RT-PCR (Flu A&B, Covid) Nasopharyngeal Swab     Status: None   Collection Time: 11/19/20  9:36 AM   Specimen: Nasopharyngeal Swab; Nasopharyngeal(NP) swabs in vial transport medium  Result Value Ref Range Status   SARS Coronavirus 2 by RT PCR NEGATIVE NEGATIVE Final    Comment: (NOTE) SARS-CoV-2 target nucleic acids are NOT DETECTED.  The SARS-CoV-2 RNA is  generally detectable in upper respiratory specimens during the acute phase of infection. The lowest concentration of SARS-CoV-2 viral copies this assay can detect is 138 copies/mL. A negative result does not preclude SARS-Cov-2 infection and should not be used as the sole basis for treatment or other patient management decisions. A negative result may occur with  improper specimen collection/handling, submission of specimen other than nasopharyngeal swab, presence of viral mutation(s) within the areas targeted by this assay, and inadequate number of viral copies(<138 copies/mL). A negative result must be combined with clinical observations, patient history, and epidemiological information. The expected result is Negative.  Fact Sheet for Patients:  BloggerCourse.com  Fact Sheet for Healthcare Providers:  SeriousBroker.it  This test is no t yet approved or cleared by the [place] FDA and  has been authorized for detection and/or diagnosis of SARS-CoV-2 by FDA under an Emergency Use Authorization (EUA). This EUA will remain  in effect (meaning this test can be used) for the duration of the COVID-19 declaration under Section 564(b)(1) of the Act, 21 U.S.C.section 360bbb-3(b)(1), unless the authorization is terminated  or revoked sooner.       Influenza A by PCR NEGATIVE NEGATIVE Final   Influenza B by PCR NEGATIVE NEGATIVE  Final    Comment: (NOTE) The Xpert Xpress SARS-CoV-2/FLU/RSV plus assay is intended as an aid in the diagnosis of influenza from Nasopharyngeal swab specimens and should not be used as a sole basis for treatment. Nasal washings and aspirates are unacceptable for Xpert Xpress SARS-CoV-2/FLU/RSV testing.  Fact Sheet for Patients: BloggerCourse.com  Fact Sheet for Healthcare Providers: SeriousBroker.it  This test is not yet approved or cleared by the [place] FDA and has been authorized for detection and/or diagnosis of SARS-CoV-2 by FDA under an Emergency Use Authorization (EUA). This EUA will remain in effect (meaning this test can be used) for the duration of the COVID-19 declaration under Section 564(b)(1) of the Act, 21 U.S.C. section 360bbb-3(b)(1), unless the authorization is terminated or revoked.  Performed at [facility], [address] [place], [place] [address]   Culture, blood (routine x 2)     Status: None (Preliminary result)   Collection Time: 11/20/20  9:07 AM   Specimen: BLOOD  Result Value Ref Range Status   Specimen Description BLOOD SITE NOT SPECIFIED  Final   Special Requests   Final    BOTTLES DRAWN AEROBIC ONLY Blood Culture results may not be optimal due to an inadequate volume of blood received in culture bottles   Culture   Final    NO GROWTH 2 DAYS Performed at [facility], [address], [place], [place] [address]    Report Status PENDING  Incomplete  Culture, blood (Routine X 2) w Reflex to ID Panel     Status: None (Preliminary result)   Collection Time: 11/21/20  7:57 AM   Specimen: BLOOD  Result Value Ref Range Status   Specimen Description BLOOD RIGHT ANTECUBITAL  Final   Special Requests   Final    BOTTLES DRAWN AEROBIC AND ANAEROBIC Blood Culture adequate volume   Culture   Final    NO GROWTH 1 DAY Performed at [facility], [address], [place], [place]  [address]    Report Status PENDING  Incomplete      Studies: No results found.    Joycelyn Das, MD  [facility] 11/22/2020  If 7PM-7AM, please contact night-coverage

## 2020-11-22 NOTE — Consult Note (Addendum)
Attending physician's note   I have taken an interval history, reviewed the chart and examined the patient. I agree with the Advanced Practitioner's note, impression, and recommendations as outlined.   40 year old female with medical history as outlined below.  GI service was consulted for evaluation of abdominal pain and nausea/vomiting.  She was not very conversive and provides a pretty minimal history when I evaluated her today.  Points to her periumbilical area as site of pain.  Pain is intermittent.  Does have nausea with nonbloody emesis.  1) Abdominal pain 2) Nausea/Vomiting 3) History of GERD - No change in pain after cholecystectomy - Based on duration of symptoms and essentially unrevealing work-up to date, plan for endoscopic evaluation with EGD tomorrow to evaluate for mucosal/luminal pathology - Can evaluate for erosive esophagitis and other reflux features at time of endoscopy. - Continue PPI for now - N.p.o. at midnight - Potentially related to history of substance/opioid use as below - CT abdomen/pelvis on 8/9 unrevealing - Abdominal ultrasound in 8/1 with hepatic steatosis prior CCY, otherwise no acute pathology  4) History of substance abuse 5) Anxiety/Depression  6) Normocytic anemia - Chronic normocytic anemia - Can evaluate for evidence of bleed with EGD - Otherwise normal iron indices, B12, folate last year  7) Asthma exacerbation - Management per primary Hospitalist service  The indications, risks, and benefits of EGD were explained to the patient in detail. Risks include but are not limited to bleeding, perforation, adverse reaction to medications, and cardiopulmonary compromise. Sequelae include but are not limited to the possibility of surgery, hospitalization, and mortality. The patient verbalized understanding and wished to proceed.   [address], DO,  FACG [phone] office                                             [facility] Consult: 1:33 PM 11/22/2020  LOS: 2 days    Referring Provider: Dr Tyson Babinski  Primary Care Physician:  [name], MD Primary Gastroenterologist:  unassigned    Reason for Consultation: Vomiting, abdominal pain.   HPI: Vicki Moore is a 40 y.o. female.  Hx anxiety.  Depression.  GERD.  Asthma.  Laparoscopic cholecystectomy 11/2019.  Polysubstance abuse with opioids, alcohol, tobacco.  Chronic pain.  COVID infection January 2022 Home meds include Protonix 40 mg bid, Zofran prn.   Patient has not been going to doctors on a regular basis for many months.  She relays disappointment with most of her encounters with medical providers.  Frequent visits to the ED.  For anxiety, nonspecific chest pain, benzodiazepine withdrawal.  Chronic, recurrent right upper quadrant pain dating back to 2011. Multiple prior GI imagings of ultrasound, CT scans HIDA scans over the past few years.  CT scans consistently showing fatty liver.  Most recently these include:  10/2019 upper GI series.  Showed small, sliding HH.  Otherwise unremarkable study.  13 mm tablet passed freely 04/23/2020 abdominal ultrasound for right upper quadrant pain, elevated LFTs was a limited exam.  CBD measured 6 mm.  Liver parenchyma normal.  Doppler studies normal.  Postsurgical cholecystectomy appearance. [facility], Dr. Ewing Schlein and Bosie Clos evaluated her in July 2021 for RUQ pain, intractable nausea vomiting.  They attributed this to biliary colic.  And she ultimately underwent laparoscopic cholecystectomy.  Patient did not undergo endoscopic evaluation.  Patient does not see her PCP regularly.  Takes her Protonix faithfully.  Since gallbladder surgery she has had new location of abdominal pain in periumbilical area.  In the past few days this has become excruciating.  Pain is refractory to Tylenol, Goody's powders which she takes once or  twice a week, primarily for headaches.  Accompanying the pain is clear emesis, nausea.  She has not been able to tolerate p.o.  In general she does not have a great appetite however she reports weighing 120 to 130 pounds prior to gallbladder surgery and she now weighs 196 pounds, 1 year later.  Bowel habits alternate between diarrhea and constipation.  No history of blood per rectum.  Patient is a vegetarian.  Hospital admission 7/31 -8/2.  For acute hypoxic respiratory failure, asthma exacerbation.  Had/1/22 ultrasound for evaluation of right chest/abdominal pain elevated LFTs. Showed previous cholecystectomy, fatty liver, 8 mm CBD.  Doppler studies normal. Readmitted 8/10 with 2 days.  Umbilicus abdominal pain, nausea vomiting.  Leftover Percocet at home, Tylenol, Goody powders x2 did not help.  Lactic acid elevated > 11.  WBCs 11.9.  Hb 11.9  >> 9.8 once she was hydrated.  Potassium of 3.2 has now normalized.  Likewise creatinine slightly elevated at 1.09 has normalized.  Troponin I is normal.  UA unremarkable.  Tox screen positive for benzos and opiates.  hCG not elevated.  CTAP with contrast shows fatty liver similar to previous.  Surgically absent gallbladder.  Bile ducts not dilated.  Small hiatal hernia.  No evidence for intestinal or gastric wall thickening.  Appendix not definitively identified but no pericecal inflammation.  Small, fat-containing umbilical hernia is stable.  Family history of severe GERD in her father.  Ovarian cancer in 1 grandmother, breast cancer in the other grandmother. Social history.  Patient is not currently employed.  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  She has a 39 year old son with whom she lives.  She is behind on the rent.  Was smoking half a pack of cigarettes a day but in the last couple of weeks she has tapered down and actually quit smoking just prior to this admission.  She denies use of illicit drugs.  She will drink a shot of [place]  whiskey every several days probably no more than once per week.  Reports that [place] whiskey helps her pain.   Past Medical History:  Diagnosis Date   Anxiety    Asthma    Asthma    Depression    GERD (gastroesophageal reflux disease)     Past Surgical History:  Procedure Laterality Date   CHOLECYSTECTOMY N/A 12/08/2019   Procedure: LAPAROSCOPIC CHOLECYSTECTOMY;  Surgeon: Franky MachoJenkins, Mark, MD;  Location: [facility];  Service: General;  Laterality: N/A;    Prior to Admission medications   Medication Sig Start Date End Date Taking? Authorizing Provider  acetaminophen (TYLENOL) 325 MG tablet Take 2 tablets (650 mg total) by mouth every 6 (six) hours as needed. 11/19/20   Sloan LeiterGray, Samuel A, DO  albuterol (PROVENTIL HFA;VENTOLIN HFA) 108 (90 BASE) MCG/ACT inhaler Inhale 2 puffs into the lungs every 6 (six) hours as needed for wheezing or shortness of breath. For shortness of breath    [provider]  alprazolam Prudy Feeler(XANAX) 2 MG tablet Take 2 mg by mouth in the morning, at noon, and at bedtime.    [provider]  Calcium Carbonate Antacid (TUMS PO) Take 1-2 tablets by mouth every hour as needed (acid reflux/heartburn).    [provider]  dicyclomine (  BENTYL) 20 MG tablet Take 1 tablet (20 mg total) by mouth 2 (two) times daily. 11/19/20   Sloan Leiter, DO  fluticasone-salmeterol (ADVAIR DISKUS) 500-50 MCG/ACT AEPB Inhale 1 puff into the lungs 2 (two) times daily. 11/12/20   Noralee Stain, DO  ipratropium-albuterol (DUONEB) 0.5-2.5 (3) MG/3ML SOLN Take 3 mLs by nebulization 3 (three) times daily as needed for wheezing or shortness of breath. 10/11/19   [provider]  nicotine (NICODERM CQ - DOSED IN MG/24 HOURS) 14 mg/24hr patch Place 1 patch (14 mg total) onto the skin daily. Patient not taking: No sig reported 08/07/20   Pokhrel, Rebekah Chesterfield, MD  ondansetron (ZOFRAN ODT) 4 MG disintegrating tablet Take 1 tablet (4 mg total) by mouth every 8 (eight) hours as needed for nausea or  vomiting. 11/19/20   Sloan Leiter, DO  pantoprazole (PROTONIX) 40 MG tablet Take 1 tablet (40 mg total) by mouth 2 (two) times daily before a meal. 11/15/19   Amin, Loura Halt, MD  tetrahydrozoline-zinc (VISINE-AC) 0.05-0.25 % ophthalmic solution Place 1 drop into both eyes 3 (three) times daily as needed (dry eyes).    [provider]  valACYclovir (VALTREX) 1000 MG tablet Take 1,000 mg by mouth 2 (two) times daily as needed (breakouts). 02/09/20   [provider]    Scheduled Meds:  budesonide (PULMICORT) nebulizer solution  0.25 mg Nebulization BID   COVID-19 mRNA Vac-TriS (Pfizer)  0.3 mL Intramuscular ONCE-1600   dicyclomine  20 mg Oral BID   ipratropium  0.5 mg Nebulization BID   levalbuterol  0.63 mg Nebulization BID   lidocaine  1 patch Transdermal QHS   nicotine  14 mg Transdermal Daily   pantoprazole  40 mg Oral BID AC   predniSONE  40 mg Oral Q breakfast   scopolamine  1 patch Transdermal Q72H   Infusions:  promethazine (PHENERGAN) injection (IM or IVPB) 12.5 mg (11/22/20 0942)   PRN Meds: acetaminophen, alprazolam, calcium carbonate, ipratropium-albuterol, ketorolac, ketorolac, ondansetron (ZOFRAN) IV, ondansetron, oxyCODONE-acetaminophen, polyethylene glycol, promethazine (PHENERGAN) injection (IM or IVPB), valACYclovir, zolpidem   Allergies as of 11/19/2020 - Review Complete 11/19/2020  Allergen Reaction Noted   Peanut-containing drug products Hives and Shortness Of Breath 03/18/2011   Penicillins Anaphylaxis, Hives, Nausea And Vomiting, and Rash 09/29/2014    History reviewed. No pertinent family history.  Social History   Socioeconomic History   Marital status: Single    Spouse name: Not on file   Number of children: 1   Years of education: Not on file   Highest education level: Not on file  Occupational History   Not on file  Tobacco Use   Smoking status: Some Days    Packs/day: 0.25    Years: 10.00    Pack years: 2.50    Types:  Cigarettes   Smokeless tobacco: Never  Vaping Use   Vaping Use: Never used  Substance and Sexual Activity   Alcohol use: Yes    Comment: rare   Drug use: Not Currently   Sexual activity: Not on file  Other Topics Concern   Not on file  Social History Narrative   Not on file   Social Determinants of Health   Financial Resource Strain: Not on file  Food Insecurity: Not on file  Transportation Needs: Not on file  Physical Activity: Not on file  Stress: Not on file  Social Connections: Not on file  Intimate Partner Violence: Not on file    REVIEW OF SYSTEMS: Constitutional: Some fatigue  this is not profound. ENT:  No nose bleeds Pulm: Mostly nonproductive cough.  Shortness of breath. CV:  No palpitations, no LE edema.  No angina GU:  No hematuria, no frequency GI: See HPI. GYN: Menorrhagia and menstrual cramps.  Not seen a GYN for routine visits since the birth of her son 12 years ago. Heme: During the course of the hospitalization she is had a lot of bruising, hematomas from phlebotomy attempts but normally does not have excessive bleeding or bruising. Transfusions: None. Neuro:  No headaches, no peripheral tingling or numbness Psych: Takes Xanax as needed for her anxiety but is not on any antidepressants or regular mood altering agents. Derm:  No itching, no rash or sores.  Endocrine:  No sweats or chills.  No polyuria or dysuria Immunization: Reviewed.   PHYSICAL EXAM: Vital signs in last 24 hours: Vitals:   11/22/20 0730 11/22/20 0952  BP:  117/78  Pulse:  (!) 56  Resp:  18  Temp:  97.9 F (36.6 C)  SpO2: 95% 94%   Wt Readings from Last 3 Encounters:  11/21/20 87 kg  11/10/20 86.2 kg  08/04/20 96.3 kg    General: Anxious, overweight, does not look acutely ill but she is uncomfortable. Head: No facial asymmetry or swelling.  No signs of head trauma.  No scleral icterus. Eyes: No scleral icterus.  No conjunctival pallor. Ears: Not hard of hearing Nose: No  congestion or discharge Mouth: Fair dentition.  Oral mucosa is moist, pink, clear.  Tongue midline. Neck: No mass, no thyromegaly. Lungs: Clear bilaterally.  No labored breathing.  Vocal quality is a bit raspy. Heart: RRR.  No MRG.  S1, S2 present. Abdomen: Obese.  Soft.  Tender to even the most minimal touch.  Active bowel sounds.  Did not appreciate organomegaly, bruits, hernias but she was not palpated with deep pressure..   Rectal: Not performed Musc/Skeltl: No joint redness, swelling or gross deformity. Extremities: No CCE.  Feet are warm. Neurologic: Oriented x3.  Moves all 4 limbs. Skin: No rash, no sores.  Significant purpura, bruising spots on her arms consistent with phlebotomy. Nodes: No cervical adenopathy Psych: Anxious, seems preoccupied.  Pressured speech.  Intake/Output from previous day: 08/11 0701 - 08/12 0700 In: 1620 [P.O.:1620] Out: -  Intake/Output this shift: Total I/O In: 457 [P.O.:457] Out: -   LAB RESULTS: Recent Labs    11/19/20 2125 11/19/20 2256 11/21/20 0756  WBC 11.9*  --  9.5  HGB 11.0* 11.9* 9.8*  HCT 37.3 35.0* 31.7*  PLT 439*  --  263   BMET Lab Results  Component Value Date   NA 138 11/21/2020   NA 140 11/19/2020   NA 140 11/19/2020   K 3.7 11/21/2020   K 3.0 (L) 11/19/2020   K 3.2 (L) 11/19/2020   CL 103 11/21/2020   CL 106 11/19/2020   CL 102 11/19/2020   CO2 24 11/21/2020   CO2 18 (L) 11/19/2020   CO2 9 (L) 11/19/2020   GLUCOSE 87 11/21/2020   GLUCOSE 132 (H) 11/19/2020   GLUCOSE 163 (H) 11/19/2020   BUN 7 11/21/2020   BUN 6 11/19/2020   BUN 7 11/19/2020   CREATININE 0.73 11/21/2020   CREATININE 0.86 11/19/2020   CREATININE 1.09 (H) 11/19/2020   CALCIUM 9.2 11/21/2020   CALCIUM 9.6 11/19/2020   CALCIUM 10.0 11/19/2020   LFT Recent Labs    11/19/20 2125 11/21/20 0756  PROT 7.8 6.8  ALBUMIN 3.9 3.6  AST 85* 54*  ALT 84* 79*  ALKPHOS 128* 105  BILITOT 0.6 0.9   PT/INR No results found for: INR,  PROTIME Hepatitis Panel No results for input(s): HEPBSAG, HCVAB, HEPAIGM, HEPBIGM in the last 72 hours. C-Diff No components found for: CDIFF Lipase     Component Value Date/Time   LIPASE 43 11/19/2020 2125    Drugs of Abuse     Component Value Date/Time   LABOPIA POSITIVE (A) 11/20/2020 1358   COCAINSCRNUR NONE DETECTED 11/20/2020 1358   LABBENZ POSITIVE (A) 11/20/2020 1358   AMPHETMU NONE DETECTED 11/20/2020 1358   THCU NONE DETECTED 11/20/2020 1358   LABBARB NONE DETECTED 11/20/2020 1358     RADIOLOGY STUDIES: No results found.    IMPRESSION:   Acute on chronic periumbilical pain.  Patient has history of right upper quadrant pain which was chronic leading up to her cholecystectomy a year ago.  Diagnosed with GERD but has not had upper endoscopy.  Takes Protonix twice daily.  Accompanying pain is nonbloody emesis.  Likely does have GERD but there is significant somatic overlay in patient with significant, suboptimally treated, anxiety.  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  Normocytic anemia.  Baseline Hgb 9 in April.  At arrival Hgb up to 11.9 in setting of nausea, vomiting, decreased p.o. intake and now measuring 9.8.  MCV 82.  Iron studies were obtained 10/2019 and showed no iron, folate, B12 deficiency.  She reports menorrhagia.     Reported history of substance abuse, alcohol abuse per notes.  Tox screen is positive for opiates and benzodiazepines and patient admits that she took some Percocet left over from surgery and she has prescription for and uses Xanax prn.  Admits to at most moderate consumption of whiskey no more than a few times a month.     Anxiety.  Depression suspected.    PLAN:        EGD?  Suspect this will show esophagitis and unlikely to change management but should probably pursue.     Needs to follow-up with a PCP who can manage her anxiety/depression.   Jennye Moccasin  11/22/2020, 1:33 PM Phone [phone]

## 2020-11-22 NOTE — H&P (View-Only) (Signed)
Attending physician's note   I have taken an interval history, reviewed the chart and examined the patient. I agree with the Advanced Practitioner's note, impression, and recommendations as outlined.   40 year old female with medical history as outlined below.  GI service was consulted for evaluation of abdominal pain and nausea/vomiting.  She was not very conversive and provides a pretty minimal history when I evaluated her today.  Points to her periumbilical area as site of pain.  Pain is intermittent.  Does have nausea with nonbloody emesis.  1) Abdominal pain 2) Nausea/Vomiting 3) History of GERD - No change in pain after cholecystectomy - Based on duration of symptoms and essentially unrevealing work-up to date, plan for endoscopic evaluation with EGD tomorrow to evaluate for mucosal/luminal pathology - Can evaluate for erosive esophagitis and other reflux features at time of endoscopy. - Continue PPI for now - N.p.o. at midnight - Potentially related to history of substance/opioid use as below - CT abdomen/pelvis on 8/9 unrevealing - Abdominal ultrasound in 8/1 with hepatic steatosis prior CCY, otherwise no acute pathology  4) History of substance abuse 5) Anxiety/Depression  6) Normocytic anemia - Chronic normocytic anemia - Can evaluate for evidence of bleed with EGD - Otherwise normal iron indices, B12, folate last year  7) Asthma exacerbation - Management per primary Hospitalist service  The indications, risks, and benefits of EGD were explained to the patient in detail. Risks include but are not limited to bleeding, perforation, adverse reaction to medications, and cardiopulmonary compromise. Sequelae include but are not limited to the possibility of surgery, hospitalization, and mortality. The patient verbalized understanding and wished to proceed.   [address], DO,  FACG [phone] office                                             [facility] Consult: 1:33 PM 11/22/2020  LOS: 2 days    Referring Provider: Dr Tyson Babinski  Primary Care Physician:  [name], MD Primary Gastroenterologist:  unassigned    Reason for Consultation: Vomiting, abdominal pain.   HPI: Vicki Moore is a 40 y.o. female.  Hx anxiety.  Depression.  GERD.  Asthma.  Laparoscopic cholecystectomy 11/2019.  Polysubstance abuse with opioids, alcohol, tobacco.  Chronic pain.  COVID infection January 2022 Home meds include Protonix 40 mg bid, Zofran prn.   Patient has not been going to doctors on a regular basis for many months.  She relays disappointment with most of her encounters with medical providers.  Frequent visits to the ED.  For anxiety, nonspecific chest pain, benzodiazepine withdrawal.  Chronic, recurrent right upper quadrant pain dating back to 2011. Multiple prior GI imagings of ultrasound, CT scans HIDA scans over the past few years.  CT scans consistently showing fatty liver.  Most recently these include:  10/2019 upper GI series.  Showed small, sliding HH.  Otherwise unremarkable study.  13 mm tablet passed freely 04/23/2020 abdominal ultrasound for right upper quadrant pain, elevated LFTs was a limited exam.  CBD measured 6 mm.  Liver parenchyma normal.  Doppler studies normal.  Postsurgical cholecystectomy appearance. [facility], Dr. Ewing Schlein and Bosie Clos evaluated her in July 2021 for RUQ pain, intractable nausea vomiting.  They attributed this to biliary colic.  And she ultimately underwent laparoscopic cholecystectomy.  Patient did not undergo endoscopic evaluation.  Patient does not see her PCP regularly.  Takes her Protonix faithfully.  Since gallbladder surgery she has had new location of abdominal pain in periumbilical area.  In the past few days this has become excruciating.  Pain is refractory to Tylenol, Goody's powders which she takes once or  twice a week, primarily for headaches.  Accompanying the pain is clear emesis, nausea.  She has not been able to tolerate p.o.  In general she does not have a great appetite however she reports weighing 120 to 130 pounds prior to gallbladder surgery and she now weighs 196 pounds, 1 year later.  Bowel habits alternate between diarrhea and constipation.  No history of blood per rectum.  Patient is a vegetarian.  Hospital admission 7/31 -8/2.  For acute hypoxic respiratory failure, asthma exacerbation.  Had/1/22 ultrasound for evaluation of right chest/abdominal pain elevated LFTs. Showed previous cholecystectomy, fatty liver, 8 mm CBD.  Doppler studies normal. Readmitted 8/10 with 2 days.  Umbilicus abdominal pain, nausea vomiting.  Leftover Percocet at home, Tylenol, Goody powders x2 did not help.  Lactic acid elevated > 11.  WBCs 11.9.  Hb 11.9  >> 9.8 once she was hydrated.  Potassium of 3.2 has now normalized.  Likewise creatinine slightly elevated at 1.09 has normalized.  Troponin I is normal.  UA unremarkable.  Tox screen positive for benzos and opiates.  hCG not elevated.  CTAP with contrast shows fatty liver similar to previous.  Surgically absent gallbladder.  Bile ducts not dilated.  Small hiatal hernia.  No evidence for intestinal or gastric wall thickening.  Appendix not definitively identified but no pericecal inflammation.  Small, fat-containing umbilical hernia is stable.  Family history of severe GERD in her father.  Ovarian cancer in 1 grandmother, breast cancer in the other grandmother. Social history.  Patient is not currently employed.  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  She has a 12-year-old son with whom she lives.  She is behind on the rent.  Was smoking half a pack of cigarettes a day but in the last couple of weeks she has tapered down and actually quit smoking just prior to this admission.  She denies use of illicit drugs.  She will drink a shot of Irish  whiskey every several days probably no more than once per week.  Reports that Irish whiskey helps her pain.   Past Medical History:  Diagnosis Date   Anxiety    Asthma    Asthma    Depression    GERD (gastroesophageal reflux disease)     Past Surgical History:  Procedure Laterality Date   CHOLECYSTECTOMY N/A 12/08/2019   Procedure: LAPAROSCOPIC CHOLECYSTECTOMY;  Surgeon: Jenkins, Mark, MD;  Location: [facility];  Service: General;  Laterality: N/A;    Prior to Admission medications   Medication Sig Start Date End Date Taking? Authorizing Provider  acetaminophen (TYLENOL) 325 MG tablet Take 2 tablets (650 mg total) by mouth every 6 (six) hours as needed. 11/19/20   Gray, Samuel A, DO  albuterol (PROVENTIL HFA;VENTOLIN HFA) 108 (90 BASE) MCG/ACT inhaler Inhale 2 puffs into the lungs every 6 (six) hours as needed for wheezing or shortness of breath. For shortness of breath    [provider]  alprazolam (XANAX) 2 MG tablet Take 2 mg by mouth in the morning, at noon, and at bedtime.    [provider]  Calcium Carbonate Antacid (TUMS PO) Take 1-2 tablets by mouth every hour as needed (acid reflux/heartburn).    [provider]  dicyclomine (  BENTYL) 20 MG tablet Take 1 tablet (20 mg total) by mouth 2 (two) times daily. 11/19/20   Gray, Samuel A, DO  fluticasone-salmeterol (ADVAIR DISKUS) 500-50 MCG/ACT AEPB Inhale 1 puff into the lungs 2 (two) times daily. 11/12/20   Choi, Jennifer, DO  ipratropium-albuterol (DUONEB) 0.5-2.5 (3) MG/3ML SOLN Take 3 mLs by nebulization 3 (three) times daily as needed for wheezing or shortness of breath. 10/11/19   [provider]  nicotine (NICODERM CQ - DOSED IN MG/24 HOURS) 14 mg/24hr patch Place 1 patch (14 mg total) onto the skin daily. Patient not taking: No sig reported 08/07/20   Pokhrel, Laxman, MD  ondansetron (ZOFRAN ODT) 4 MG disintegrating tablet Take 1 tablet (4 mg total) by mouth every 8 (eight) hours as needed for nausea or  vomiting. 11/19/20   Gray, Samuel A, DO  pantoprazole (PROTONIX) 40 MG tablet Take 1 tablet (40 mg total) by mouth 2 (two) times daily before a meal. 11/15/19   Amin, Ankit Chirag, MD  tetrahydrozoline-zinc (VISINE-AC) 0.05-0.25 % ophthalmic solution Place 1 drop into both eyes 3 (three) times daily as needed (dry eyes).    [provider]  valACYclovir (VALTREX) 1000 MG tablet Take 1,000 mg by mouth 2 (two) times daily as needed (breakouts). 02/09/20   [provider]    Scheduled Meds:  budesonide (PULMICORT) nebulizer solution  0.25 mg Nebulization BID   COVID-19 mRNA Vac-TriS (Pfizer)  0.3 mL Intramuscular ONCE-1600   dicyclomine  20 mg Oral BID   ipratropium  0.5 mg Nebulization BID   levalbuterol  0.63 mg Nebulization BID   lidocaine  1 patch Transdermal QHS   nicotine  14 mg Transdermal Daily   pantoprazole  40 mg Oral BID AC   predniSONE  40 mg Oral Q breakfast   scopolamine  1 patch Transdermal Q72H   Infusions:  promethazine (PHENERGAN) injection (IM or IVPB) 12.5 mg (11/22/20 0942)   PRN Meds: acetaminophen, alprazolam, calcium carbonate, ipratropium-albuterol, ketorolac, ketorolac, ondansetron (ZOFRAN) IV, ondansetron, oxyCODONE-acetaminophen, polyethylene glycol, promethazine (PHENERGAN) injection (IM or IVPB), valACYclovir, zolpidem   Allergies as of 11/19/2020 - Review Complete 11/19/2020  Allergen Reaction Noted   Peanut-containing drug products Hives and Shortness Of Breath 03/18/2011   Penicillins Anaphylaxis, Hives, Nausea And Vomiting, and Rash 09/29/2014    History reviewed. No pertinent family history.  Social History   Socioeconomic History   Marital status: Single    Spouse name: Not on file   Number of children: 1   Years of education: Not on file   Highest education level: Not on file  Occupational History   Not on file  Tobacco Use   Smoking status: Some Days    Packs/day: 0.25    Years: 10.00    Pack years: 2.50    Types:  Cigarettes   Smokeless tobacco: Never  Vaping Use   Vaping Use: Never used  Substance and Sexual Activity   Alcohol use: Yes    Comment: rare   Drug use: Not Currently   Sexual activity: Not on file  Other Topics Concern   Not on file  Social History Narrative   Not on file   Social Determinants of Health   Financial Resource Strain: Not on file  Food Insecurity: Not on file  Transportation Needs: Not on file  Physical Activity: Not on file  Stress: Not on file  Social Connections: Not on file  Intimate Partner Violence: Not on file    REVIEW OF SYSTEMS: Constitutional: Some fatigue   this is not profound. ENT:  No nose bleeds Pulm: Mostly nonproductive cough.  Shortness of breath. CV:  No palpitations, no LE edema.  No angina GU:  No hematuria, no frequency GI: See HPI. GYN: Menorrhagia and menstrual cramps.  Not seen a GYN for routine visits since the birth of her son 12 years ago. Heme: During the course of the hospitalization she is had a lot of bruising, hematomas from phlebotomy attempts but normally does not have excessive bleeding or bruising. Transfusions: None. Neuro:  No headaches, no peripheral tingling or numbness Psych: Takes Xanax as needed for her anxiety but is not on any antidepressants or regular mood altering agents. Derm:  No itching, no rash or sores.  Endocrine:  No sweats or chills.  No polyuria or dysuria Immunization: Reviewed.   PHYSICAL EXAM: Vital signs in last 24 hours: Vitals:   11/22/20 0730 11/22/20 0952  BP:  117/78  Pulse:  (!) 56  Resp:  18  Temp:  97.9 F (36.6 C)  SpO2: 95% 94%   Wt Readings from Last 3 Encounters:  11/21/20 87 kg  11/10/20 86.2 kg  08/04/20 96.3 kg    General: Anxious, overweight, does not look acutely ill but she is uncomfortable. Head: No facial asymmetry or swelling.  No signs of head trauma.  No scleral icterus. Eyes: No scleral icterus.  No conjunctival pallor. Ears: Not hard of hearing Nose: No  congestion or discharge Mouth: Fair dentition.  Oral mucosa is moist, pink, clear.  Tongue midline. Neck: No mass, no thyromegaly. Lungs: Clear bilaterally.  No labored breathing.  Vocal quality is a bit raspy. Heart: RRR.  No MRG.  S1, S2 present. Abdomen: Obese.  Soft.  Tender to even the most minimal touch.  Active bowel sounds.  Did not appreciate organomegaly, bruits, hernias but she was not palpated with deep pressure..   Rectal: Not performed Musc/Skeltl: No joint redness, swelling or gross deformity. Extremities: No CCE.  Feet are warm. Neurologic: Oriented x3.  Moves all 4 limbs. Skin: No rash, no sores.  Significant purpura, bruising spots on her arms consistent with phlebotomy. Nodes: No cervical adenopathy Psych: Anxious, seems preoccupied.  Pressured speech.  Intake/Output from previous day: 08/11 0701 - 08/12 0700 In: 1620 [P.O.:1620] Out: -  Intake/Output this shift: Total I/O In: 457 [P.O.:457] Out: -   LAB RESULTS: Recent Labs    11/19/20 2125 11/19/20 2256 11/21/20 0756  WBC 11.9*  --  9.5  HGB 11.0* 11.9* 9.8*  HCT 37.3 35.0* 31.7*  PLT 439*  --  263   BMET Lab Results  Component Value Date   NA 138 11/21/2020   NA 140 11/19/2020   NA 140 11/19/2020   K 3.7 11/21/2020   K 3.0 (L) 11/19/2020   K 3.2 (L) 11/19/2020   CL 103 11/21/2020   CL 106 11/19/2020   CL 102 11/19/2020   CO2 24 11/21/2020   CO2 18 (L) 11/19/2020   CO2 9 (L) 11/19/2020   GLUCOSE 87 11/21/2020   GLUCOSE 132 (H) 11/19/2020   GLUCOSE 163 (H) 11/19/2020   BUN 7 11/21/2020   BUN 6 11/19/2020   BUN 7 11/19/2020   CREATININE 0.73 11/21/2020   CREATININE 0.86 11/19/2020   CREATININE 1.09 (H) 11/19/2020   CALCIUM 9.2 11/21/2020   CALCIUM 9.6 11/19/2020   CALCIUM 10.0 11/19/2020   LFT Recent Labs    11/19/20 2125 11/21/20 0756  PROT 7.8 6.8  ALBUMIN 3.9 3.6  AST 85* 54*    ALT 84* 79*  ALKPHOS 128* 105  BILITOT 0.6 0.9   PT/INR No results found for: INR,  PROTIME Hepatitis Panel No results for input(s): HEPBSAG, HCVAB, HEPAIGM, HEPBIGM in the last 72 hours. C-Diff No components found for: CDIFF Lipase     Component Value Date/Time   LIPASE 43 11/19/2020 2125    Drugs of Abuse     Component Value Date/Time   LABOPIA POSITIVE (A) 11/20/2020 1358   COCAINSCRNUR NONE DETECTED 11/20/2020 1358   LABBENZ POSITIVE (A) 11/20/2020 1358   AMPHETMU NONE DETECTED 11/20/2020 1358   THCU NONE DETECTED 11/20/2020 1358   LABBARB NONE DETECTED 11/20/2020 1358     RADIOLOGY STUDIES: No results found.    IMPRESSION:   Acute on chronic periumbilical pain.  Patient has history of right upper quadrant pain which was chronic leading up to her cholecystectomy a year ago.  Diagnosed with GERD but has not had upper endoscopy.  Takes Protonix twice daily.  Accompanying pain is nonbloody emesis.  Likely does have GERD but there is significant somatic overlay in patient with significant, suboptimally treated, anxiety.  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  Normocytic anemia.  Baseline Hgb 9 in April.  At arrival Hgb up to 11.9 in setting of nausea, vomiting, decreased p.o. intake and now measuring 9.8.  MCV 82.  Iron studies were obtained 10/2019 and showed no iron, folate, B12 deficiency.  She reports menorrhagia.     Reported history of substance abuse, alcohol abuse per notes.  Tox screen is positive for opiates and benzodiazepines and patient admits that she took some Percocet left over from surgery and she has prescription for and uses Xanax prn.  Admits to at most moderate consumption of whiskey no more than a few times a month.     Anxiety.  Depression suspected.    PLAN:        EGD?  Suspect this will show esophagitis and unlikely to change management but should probably pursue.     Needs to follow-up with a PCP who can manage her anxiety/depression.   Sarah Gribbin  11/22/2020, 1:33 PM Phone [phone]      

## 2020-11-22 NOTE — Plan of Care (Signed)
  Problem: Education: Goal: Knowledge of General Education information will improve Description Including pain rating scale, medication(s)/side effects and non-pharmacologic comfort measures Outcome: Progressing   Problem: Health Behavior/Discharge Planning: Goal: Ability to manage health-related needs will improve Outcome: Progressing   

## 2020-11-23 ENCOUNTER — Inpatient Hospital Stay (HOSPITAL_COMMUNITY): Payer: Medicaid Other

## 2020-11-23 ENCOUNTER — Encounter (HOSPITAL_COMMUNITY): Payer: Self-pay | Admitting: Internal Medicine

## 2020-11-23 ENCOUNTER — Inpatient Hospital Stay (HOSPITAL_COMMUNITY): Payer: Medicaid Other | Admitting: Anesthesiology

## 2020-11-23 ENCOUNTER — Encounter (HOSPITAL_COMMUNITY)
Admission: EM | Disposition: A | Discharge status: Home / Self Care | Payer: Self-pay | Source: Home / Self Care | Attending: Internal Medicine

## 2020-11-23 DIAGNOSIS — K449 Diaphragmatic hernia without obstruction or gangrene: Secondary | ICD-10-CM

## 2020-11-23 DIAGNOSIS — R069 Unspecified abnormalities of breathing: Secondary | ICD-10-CM | POA: Diagnosis not present

## 2020-11-23 DIAGNOSIS — R1111 Vomiting without nausea: Secondary | ICD-10-CM

## 2020-11-23 DIAGNOSIS — K2100 Gastro-esophageal reflux disease with esophagitis, without bleeding: Secondary | ICD-10-CM | POA: Diagnosis not present

## 2020-11-23 HISTORY — PX: ESOPHAGOGASTRODUODENOSCOPY (EGD) WITH PROPOFOL: SHX5813

## 2020-11-23 HISTORY — PX: BIOPSY: SHX5522

## 2020-11-23 LAB — ECHOCARDIOGRAM COMPLETE
Area-P 1/2: 3.53 cm2
Calc EF: 55 %
Height: 61 in
S' Lateral: 3.3 cm
Single Plane A2C EF: 52.2 %
Single Plane A4C EF: 61.1 %
Weight: 3213.42 oz

## 2020-11-23 SURGERY — ESOPHAGOGASTRODUODENOSCOPY (EGD) WITH PROPOFOL
Anesthesia: Monitor Anesthesia Care

## 2020-11-23 MED ORDER — LACTATED RINGERS IV SOLN: INTRAVENOUS | Status: DC

## 2020-11-23 MED ORDER — PROPOFOL 500 MG/50ML IV EMUL
INTRAVENOUS | Status: DC | PRN
  Administered 2020-11-23: 150 ug/kg/min via INTRAVENOUS

## 2020-11-23 MED ORDER — LIDOCAINE 2% (20 MG/ML) 5 ML SYRINGE
INTRAMUSCULAR | Status: DC | PRN
  Administered 2020-11-23: 60 mg via INTRAVENOUS

## 2020-11-23 MED ORDER — IPRATROPIUM BROMIDE 0.02 % IN SOLN: 0.5000 mg | Freq: Four times a day (QID) | RESPIRATORY_TRACT | Status: DC | PRN

## 2020-11-23 MED ORDER — IPRATROPIUM BROMIDE 0.02 % IN SOLN
0.5000 mg | Freq: Four times a day (QID) | RESPIRATORY_TRACT | Status: DC | PRN
  Administered 2020-11-24: 0.5 mg via RESPIRATORY_TRACT
  Filled 2020-11-23: qty 2.5

## 2020-11-23 MED ORDER — ONDANSETRON HCL 4 MG/2ML IJ SOLN
INTRAMUSCULAR | Status: DC | PRN
  Administered 2020-11-23: 4 mg via INTRAVENOUS

## 2020-11-23 MED ORDER — METOCLOPRAMIDE HCL 5 MG/ML IJ SOLN
10.0000 mg | Freq: Three times a day (TID) | INTRAMUSCULAR | Status: DC
  Administered 2020-11-23 – 2020-11-24 (×3): 10 mg via INTRAVENOUS
  Filled 2020-11-23 (×4): qty 2

## 2020-11-23 MED ORDER — IPRATROPIUM BROMIDE 0.02 % IN SOLN: 0.5000 mg | Freq: Four times a day (QID) | RESPIRATORY_TRACT | Status: DC

## 2020-11-23 MED ORDER — LEVALBUTEROL HCL 0.63 MG/3ML IN NEBU
0.6300 mg | INHALATION_SOLUTION | Freq: Four times a day (QID) | RESPIRATORY_TRACT | Status: DC | PRN
  Administered 2020-11-24: 0.63 mg via RESPIRATORY_TRACT
  Filled 2020-11-23: qty 3

## 2020-11-23 MED ORDER — LEVALBUTEROL HCL 0.63 MG/3ML IN NEBU: 0.6300 mg | INHALATION_SOLUTION | Freq: Four times a day (QID) | RESPIRATORY_TRACT | Status: DC | PRN

## 2020-11-23 MED ORDER — PROPOFOL 10 MG/ML IV BOLUS
INTRAVENOUS | Status: DC | PRN
  Administered 2020-11-23 (×2): 20 mg via INTRAVENOUS

## 2020-11-23 SURGICAL SUPPLY — 15 items

## 2020-11-23 NOTE — Progress Notes (Signed)
  Echocardiogram 2D Echocardiogram has been performed.  Vicki Moore 11/23/2020, 9:51 AM

## 2020-11-23 NOTE — Anesthesia Postprocedure Evaluation (Signed)
Anesthesia Post Note  Patient: Vicki Moore  Procedure(s) Performed: ESOPHAGOGASTRODUODENOSCOPY (EGD) WITH PROPOFOL BIOPSY     Patient location during evaluation: PACU Anesthesia Type: MAC Level of consciousness: awake and alert Pain management: pain level controlled Vital Signs Assessment: post-procedure vital signs reviewed and stable Respiratory status: spontaneous breathing, nonlabored ventilation and respiratory function stable Cardiovascular status: stable and blood pressure returned to baseline Anesthetic complications: no   No notable events documented.  Last Vitals:  Vitals:   11/23/20 1300 11/23/20 1309  BP: 117/79 129/85  Pulse: 92 79  Resp: 15 17  Temp:    SpO2: 98% 99%    Last Pain:  Vitals:   11/23/20 1309  TempSrc:   PainSc: 10-Worst pain ever                 Audry Pili

## 2020-11-23 NOTE — Anesthesia Preprocedure Evaluation (Addendum)
Anesthesia Evaluation  Patient identified by MRN, date of birth, ID band Patient awake    Reviewed: Allergy & Precautions, NPO status , Patient's Chart, lab work & pertinent test results  History of Anesthesia Complications Negative for: history of anesthetic complications  Airway Mallampati: II  TM Distance: >3 FB Neck ROM: Full    Dental  (+) Dental Advisory Given   Pulmonary asthma , Current Smoker and Patient abstained from smoking.,    Pulmonary exam normal        Cardiovascular negative cardio ROS Normal cardiovascular exam     Neuro/Psych PSYCHIATRIC DISORDERS Anxiety Depression negative neurological ROS     GI/Hepatic GERD  Medicated and Controlled,(+)     substance abuse (benzodiazepine abuse)  alcohol use,   Endo/Other   Obesity   Renal/GU negative Renal ROS     Musculoskeletal negative musculoskeletal ROS (+) narcotic dependent  Abdominal   Peds  Hematology  (+) anemia ,   Anesthesia Other Findings HSV   Reproductive/Obstetrics                            Anesthesia Physical Anesthesia Plan  ASA: 3  Anesthesia Plan: MAC   Post-op Pain Management:    Induction:   PONV Risk Score and Plan: 2 and Propofol infusion and Treatment may vary due to age or medical condition  Airway Management Planned: Nasal Cannula and Natural Airway  Additional Equipment: None  Intra-op Plan:   Post-operative Plan:   Informed Consent: I have reviewed the patients History and Physical, chart, labs and discussed the procedure including the risks, benefits and alternatives for the proposed anesthesia with the patient or authorized representative who has indicated his/her understanding and acceptance.       Plan Discussed with: CRNA and Anesthesiologist  Anesthesia Plan Comments:        Anesthesia Quick Evaluation

## 2020-11-23 NOTE — Transfer of Care (Signed)
Immediate Anesthesia Transfer of Care Note  Patient: Vicki Moore  Procedure(s) Performed: ESOPHAGOGASTRODUODENOSCOPY (EGD) WITH PROPOFOL BIOPSY  Patient Location: Endoscopy Unit  Anesthesia Type:MAC  Level of Consciousness: awake, alert  and oriented  Airway & Oxygen Therapy: Patient Spontanous Breathing and Patient connected to nasal cannula oxygen  Post-op Assessment: Report given to RN and Post -op Vital signs reviewed and stable  Post vital signs: Reviewed and stable  Last Vitals:  Vitals Value Taken Time  BP 101/83 11/23/20 1246  Temp    Pulse 71 11/23/20 1246  Resp 22 11/23/20 1246  SpO2 99 % 11/23/20 1246  Vitals shown include unvalidated device data.  Last Pain:  Vitals:   11/23/20 1201  TempSrc: Oral  PainSc: 10-Worst pain ever      Patients Stated Pain Goal: 0 (12/75/17 0017)  Complications: No notable events documented.

## 2020-11-23 NOTE — Op Note (Signed)
[facility] Patient Name: Vicki PeacockLeanne Stai Procedure Date : 11/23/2020 MRN: [id number] Attending MD: Doristine LocksVito Oziah Vitanza , MD Date of Birth: 11/06/1980 CSN: [id number] Age: 40 Admit Type: Inpatient Procedure:                Upper GI endoscopy Indications:              Epigastric abdominal pain, Periumbilical abdominal                            pain, Nausea with vomiting Providers:                Doristine LocksVito Kace Hartje, MD, Estella HuskJarmila Fucs RN, RN, Lawson Radararlene                            Davis, Technician, Dairl PonderFudan Jiang, CRNA Referring MD:              Medicines:                Monitored Anesthesia Care Complications:            No immediate complications. Estimated Blood Loss:     Estimated blood loss was minimal. Procedure:                Pre-Anesthesia Assessment:                           - Prior to the procedure, a History and Physical                            was performed, and patient medications and                            allergies were reviewed. The patient's tolerance of                            previous anesthesia was also reviewed. The risks                            and benefits of the procedure and the sedation                            options and risks were discussed with the patient.                            All questions were answered, and informed consent                            was obtained. Prior Anticoagulants: The patient has                            taken no previous anticoagulant or antiplatelet                            agents. ASA Grade Assessment: III - A patient with  severe systemic disease. After reviewing the risks                            and benefits, the patient was deemed in                            satisfactory condition to undergo the procedure.                           After obtaining informed consent, the endoscope was                            passed under direct vision. Throughout the                             procedure, the patient's blood pressure, pulse, and                            oxygen saturations were monitored continuously. The                            [id number]-[id number] ([id number]) Olympus endoscope was introduced                            through the mouth, and advanced to the second part                            of duodenum. The upper GI endoscopy was                            accomplished without difficulty. The patient                            tolerated the procedure well. Scope In: Scope Out: Findings:      LA Grade C (one or more mucosal breaks continuous between tops of 2 or       more mucosal folds, less than 75% circumference) esophagitis with no       bleeding was found in the lower third of the esophagus. Biopsies were       taken with a cold forceps for histology. Estimated blood loss was       minimal.      A 4 cm hiatal hernia was present.      The entire examined stomach was normal. The pylorus was patent and       easily traversed. Biopsies were taken with a cold forceps for       Helicobacter pylori testing. Estimated blood loss was minimal.      The examined duodenum was normal. Impression:               - LA Grade C reflux esophagitis with no bleeding.                            Biopsied.                           -  4 cm hiatal hernia.                           - Normal stomach. Biopsied.                           - Normal examined duodenum. Recommendation:           - Return patient to hospital ward for ongoing care.                           - Advance diet as tolerated.                           - Use Protonix (pantoprazole) 40 mg PO BID for 8                            weeks to promote mucosal healing and treat                            underlying reflux. After 8 weeks, can reduce to 40                            mg daily and continue to titrate to the lowest                            effective dose to treat reflux.                           - Await  pathology results.                           - Inpatient GI service will remain available as                            needed. Procedure Code(s):        --- Professional ---                           [phone], Esophagogastroduodenoscopy, flexible,                            transoral; with biopsy, single or multiple Diagnosis Code(s):        --- Professional ---                           K21.00, Gastro-esophageal reflux disease with                            esophagitis, without bleeding                           K44.9, Diaphragmatic hernia without obstruction or                            gangrene  R10.13, Epigastric pain                           R10.33, Periumbilical pain                           R11.2, Nausea with vomiting, unspecified CPT copyright 2019 [employer] Association. All rights reserved. The codes documented in this report are preliminary and upon coder review may  be revised to meet current compliance requirements. Doristine Locks, MD 11/23/2020 12:47:21 PM Number of Addenda: 0

## 2020-11-23 NOTE — Interval H&P Note (Signed)
History and Physical Interval Note:  TTE completed yesterday and was normal with EF 60-65%.   11/23/2020 12:16 PM  Vicki Moore  has presented today for surgery, with the diagnosis of abdominal pain and nausea..  The various methods of treatment have been discussed with the patient and family. After consideration of risks, benefits and other options for treatment, the patient has consented to  Procedure(s): ESOPHAGOGASTRODUODENOSCOPY (EGD) WITH PROPOFOL (N/A) as a surgical intervention.  The patient's history has been reviewed, patient examined, no change in status, stable for surgery.  I have reviewed the patient's chart and labs.  Questions were answered to the patient's satisfaction.     Verlin Dike Shellie Goettl

## 2020-11-23 NOTE — Progress Notes (Signed)
PROGRESS NOTE  ASHAKI FROSCH ZOX:[id number] DOB: 1981/01/11 DOA: 11/19/2020 PCP: [name], MD   LOS: 3 days   Brief narrative:  ALANNIE AMODIO is a 40 y.o. female with medical history significant of asthma, depression, anxiety, chronic abdominal pain, opiate abuse presented to hospital with wheezing shortness of breath and abdominal pain.  Patient was initially seen in the ED and had felt better so she was discharged home after negative CT scan but then had increasing respiratory distress with pulse ox of 85% on room air so she was brought into the hospital and was started on CPAP.  In the ED, patient was initially put on BiPAP and was admitted to hospital for further evaluation and treatment.  Patient stated that she had ran out of her Advair for a month.  Patient was then admitted to the hospital for further evaluation and treatment.  Assessment/Plan:  Principal Problem:   Acute asthma exacerbation Active Problems:   Acute respiratory failure with hypoxia (HCC)   Acute hypoxemic respiratory failure (HCC)   Abdominal pain   SVT (supraventricular tachycardia) (HCC)   Lactic acidosis   Non-intractable vomiting   Hiatal hernia   Acute hypoxemic respiratory failure secondary to asthma exacerbation.  Was not on Advair for a month.  Initially was on BiPAP.   Continue  Xopenex Pulmicort nebs oxygen.  Lactate was slightly elevated likely secondary to increased work of breathing.  This has improved at this time.  D-dimer was negative.  Breathing has improved overall.  Currently off oxygen at this time.  Continue bronchodilators.  Will discontinue prednisone starting today.  Nausea vomiting persistent abdominal pain.  Patient has had cholecystectomy in the past for abdominal pain.  GI was consulted for frequent episodes of vomiting and poor intolerance.  Continue Protonix, IV twice daily.  CT scan abdomen and pelvis was negative.Beta-hCG negative, lipase negative.  Has a reducible  periumbilical hernia.  History of opiate use High narcotic seeking tendency noted.  Urine drug screen was positive for opiates and benzodiazepines.  Patient underwent EGD today with findings of esophagitis but no gastric ulceration.  GI recommends trial of Reglan IV 3 times daily for possible gastroparesis.  Will restart today.  Continue IV fluid hydration.  Patient might have a component of anxiety and depression causing excessive feeling of abdominal pain.   Hypokalemia.  Improved after replacement..   Supraventricular tachycardia.  Was cardioverted enroute to the hospital.  Currently in sinus rhythm.  Electrolytes within normal limits.  Check 2D echocardiogram with LV ejection fraction of 60 to 65%.Marland Kitchen  TSH within normal limits.   Reactive leukocytosis.  No signs of infection so far.  Blood cultures negative in 3 days.  Urine analysis negative.  WBC at 9.5.   Elevated lactate.  Resolved after IV fluid hydration and oxygenation.  DVT prophylaxis: SCDs Start: 11/20/20 0602   Code Status: Full code  Family Communication: None today.  I spoke with the patient's mom on the phone yesterday.  Status is: Inpatient  Remains inpatient appropriate because:IV treatments appropriate due to intensity of illness or inability to take PO and Inpatient level of care appropriate due to severity of illness  Dispo: The patient is from: Home              Anticipated d/c is to: Home likely in 1 to 2 days              Patient currently is not medically stable to d/c.   Difficult to place  patient No  Consultants: GI  Procedures: None  Anti-infectives:  Valtrex  Anti-infectives (From admission, onward)    Start     Dose/Rate Route Frequency Ordered Stop   11/20/20 1400  valACYclovir (VALTREX) tablet 1,000 mg        1,000 mg Oral 2 times daily PRN 11/20/20 1402        Subjective: Today, patient was seen and examined before endoscopic evaluation.  Complains of abdominal pain, awaiting for EGD this  morning  Objective: Vitals:   11/23/20 1300 11/23/20 1309  BP: 117/79 129/85  Pulse: 92 79  Resp: 15 17  Temp:    SpO2: 98% 99%    Intake/Output Summary (Last 24 hours) at 11/23/2020 1448 Last data filed at 11/23/2020 1239 Gross per 24 hour  Intake 1250 ml  Output --  Net 1250 ml    Filed Weights   11/21/20 0533 11/21/20 2254 11/23/20 0532  Weight: 85 kg 87 kg 91.1 kg   Body mass index is 37.95 kg/m.   Physical Exam:  General:  Average built, not in obvious distress, obese HENT:   No scleral pallor or icterus noted. Oral mucosa is moist.  Chest:    Diminished breath sounds bilaterally. No crackles or wheezes.  CVS: S1 &S2 heard. No murmur.  Regular rate and rhythm. Abdomen: Soft, nonspecific tenderness on palpation, small reducible umbilical hernia, nondistended.  Bowel sounds are heard.   Extremities: No cyanosis, clubbing or edema.  Peripheral pulses are palpable. Psych: Alert, awake and oriented, anxious. CNS:  No cranial nerve deficits.  Power equal in all extremities.   Skin: Warm and dry.  No rashes noted.  Data Review: I have personally reviewed the following laboratory data and studies,  CBC: Recent Labs  Lab 11/19/20 0936 11/19/20 2125 11/19/20 2256 11/21/20 0756  WBC 6.6 11.9*  --  9.5  NEUTROABS  --  9.7*  --   --   HGB 9.9* 11.0* 11.9* 9.8*  HCT 31.6* 37.3 35.0* 31.7*  MCV 82.1 86.5  --  82.6  PLT 257 439*  --  263    Basic Metabolic Panel: Recent Labs  Lab 11/19/20 0936 11/19/20 2125 11/19/20 2256 11/19/20 2318 11/21/20 0756  NA 140 138 140 140 138  K 3.3* 3.4* 3.2* 3.0* 3.7  CL 102 102  --  106 103  CO2 26 9*  --  18* 24  GLUCOSE 101* 163*  --  132* 87  BUN 9 7  --  6 7  CREATININE 0.64 1.09*  --  0.86 0.73  CALCIUM 9.8 10.0  --  9.6 9.2  MG  --  1.7  --   --   --     Liver Function Tests: Recent Labs  Lab 11/19/20 0936 11/19/20 2125 11/21/20 0756  AST 81* 85* 54*  ALT 83* 84* 79*  ALKPHOS 120 128* 105  BILITOT 0.8 0.6  0.9  PROT 7.0 7.8 6.8  ALBUMIN 3.5 3.9 3.6    Recent Labs  Lab 11/19/20 0936 11/19/20 2125  LIPASE 55* 43    No results for input(s): AMMONIA in the last 168 hours. Cardiac Enzymes: No results for input(s): CKTOTAL, CKMB, CKMBINDEX, TROPONINI in the last 168 hours. BNP (last 3 results) Recent Labs    04/16/20 1006  BNP 77.5     ProBNP (last 3 results) No results for input(s): PROBNP in the last 8760 hours.  CBG: No results for input(s): GLUCAP in the last 168 hours. Recent Results (from the past  240 hour(s))  Resp Panel by RT-PCR (Flu A&B, Covid) Nasopharyngeal Swab     Status: None   Collection Time: 11/19/20  9:36 AM   Specimen: Nasopharyngeal Swab; Nasopharyngeal(NP) swabs in vial transport medium  Result Value Ref Range Status   SARS Coronavirus 2 by RT PCR NEGATIVE NEGATIVE Final    Comment: (NOTE) SARS-CoV-2 target nucleic acids are NOT DETECTED.  The SARS-CoV-2 RNA is generally detectable in upper respiratory specimens during the acute phase of infection. The lowest concentration of SARS-CoV-2 viral copies this assay can detect is 138 copies/mL. A negative result does not preclude SARS-Cov-2 infection and should not be used as the sole basis for treatment or other patient management decisions. A negative result may occur with  improper specimen collection/handling, submission of specimen other than nasopharyngeal swab, presence of viral mutation(s) within the areas targeted by this assay, and inadequate number of viral copies(<138 copies/mL). A negative result must be combined with clinical observations, patient history, and epidemiological information. The expected result is Negative.  Fact Sheet for Patients:  BloggerCourse.com  Fact Sheet for Healthcare Providers:  SeriousBroker.it  This test is no t yet approved or cleared by the [place] FDA and  has been authorized for detection and/or  diagnosis of SARS-CoV-2 by FDA under an Emergency Use Authorization (EUA). This EUA will remain  in effect (meaning this test can be used) for the duration of the COVID-19 declaration under Section 564(b)(1) of the Act, 21 U.S.C.section 360bbb-3(b)(1), unless the authorization is terminated  or revoked sooner.       Influenza A by PCR NEGATIVE NEGATIVE Final   Influenza B by PCR NEGATIVE NEGATIVE Final    Comment: (NOTE) The Xpert Xpress SARS-CoV-2/FLU/RSV plus assay is intended as an aid in the diagnosis of influenza from Nasopharyngeal swab specimens and should not be used as a sole basis for treatment. Nasal washings and aspirates are unacceptable for Xpert Xpress SARS-CoV-2/FLU/RSV testing.  Fact Sheet for Patients: BloggerCourse.com  Fact Sheet for Healthcare Providers: SeriousBroker.it  This test is not yet approved or cleared by the [place] FDA and has been authorized for detection and/or diagnosis of SARS-CoV-2 by FDA under an Emergency Use Authorization (EUA). This EUA will remain in effect (meaning this test can be used) for the duration of the COVID-19 declaration under Section 564(b)(1) of the Act, 21 U.S.C. section 360bbb-3(b)(1), unless the authorization is terminated or revoked.  Performed at [facility], [address] [place], [place] [address]   Culture, blood (routine x 2)     Status: None (Preliminary result)   Collection Time: 11/20/20  9:07 AM   Specimen: BLOOD  Result Value Ref Range Status   Specimen Description BLOOD SITE NOT SPECIFIED  Final   Special Requests   Final    BOTTLES DRAWN AEROBIC ONLY Blood Culture results may not be optimal due to an inadequate volume of blood received in culture bottles   Culture   Final    NO GROWTH 3 DAYS Performed at [facility], [address] [place], [place] [address]    Report Status PENDING  Incomplete  Culture, blood (Routine X 2) w  Reflex to ID Panel     Status: None (Preliminary result)   Collection Time: 11/21/20  7:57 AM   Specimen: BLOOD  Result Value Ref Range Status   Specimen Description BLOOD RIGHT ANTECUBITAL  Final   Special Requests   Final    BOTTLES DRAWN AEROBIC AND ANAEROBIC Blood Culture  adequate volume   Culture   Final    NO GROWTH 2 DAYS Performed at [facility], [address] [place], [place] [address]    Report Status PENDING  Incomplete      Studies: ECHOCARDIOGRAM COMPLETE  Result Date: 11/23/2020    ECHOCARDIOGRAM REPORT   Patient Name:   Vicki PinesLEANNE M Kunka Date of Exam: 11/23/2020 Medical Rec #:  [id number]          Height:       61.0 in Accession #:    [id number]         Weight:       200.8 lb Date of Birth:  03/17/1981           BSA:          1.892 m Patient Age:    40 years           BP:           115/72 mmHg Patient Gender: F                  HR:           20 bpm. Exam Location:  Inpatient Procedure: 2D Echo, Cardiac Doppler and Color Doppler Indications:    Dyspnea R06.00  History:        Patient has prior history of Echocardiogram examinations, most                 recent 11/12/2019.  Sonographer:    Renella CunasJulia Swaim RDCS Referring Phys: [id number] [facility] Christianjames Soule IMPRESSIONS  1. Left ventricular ejection fraction, by estimation, is 60 to 65%. The left ventricle has normal function. The left ventricle has no regional wall motion abnormalities. Left ventricular diastolic parameters were normal.  2. Right ventricular systolic function is normal. The right ventricular size is normal.  3. The mitral valve is normal in structure. Trivial mitral valve regurgitation. No evidence of mitral stenosis.  4. The aortic valve is tricuspid. Aortic valve regurgitation is not visualized. No aortic stenosis is present.  5. The inferior vena cava is normal in size with greater than 50% respiratory variability, suggesting right atrial pressure of 3 mmHg. FINDINGS  Left Ventricle: Left ventricular ejection fraction, by  estimation, is 60 to 65%. The left ventricle has normal function. The left ventricle has no regional wall motion abnormalities. The left ventricular internal cavity size was normal in size. There is  no left ventricular hypertrophy. Left ventricular diastolic parameters were normal. Right Ventricle: The right ventricular size is normal. Right ventricular systolic function is normal. Left Atrium: Left atrial size was normal in size. Right Atrium: Right atrial size was normal in size. Pericardium: There is no evidence of pericardial effusion. Mitral Valve: The mitral valve is normal in structure. Trivial mitral valve regurgitation. No evidence of mitral valve stenosis. Tricuspid Valve: The tricuspid valve is normal in structure. Tricuspid valve regurgitation is trivial. No evidence of tricuspid stenosis. Aortic Valve: The aortic valve is tricuspid. Aortic valve regurgitation is not visualized. No aortic stenosis is present. Pulmonic Valve: The pulmonic valve was normal in structure. Pulmonic valve regurgitation is not visualized. No evidence of pulmonic stenosis. Aorta: The aortic root is normal in size and structure. Venous: The inferior vena cava is normal in size with greater than 50% respiratory variability, suggesting right atrial pressure of 3 mmHg. IAS/Shunts: No atrial level shunt detected by color flow Doppler.  LEFT VENTRICLE PLAX 2D LVIDd:  4.50 cm      Diastology LVIDs:         3.30 cm      LV e' medial:    8.31 cm/s LV PW:         0.80 cm      LV E/e' medial:  10.5 LV IVS:        0.80 cm      LV e' lateral:   15.30 cm/s LVOT diam:     2.00 cm      LV E/e' lateral: 5.7 LV SV:         60 LV SV Index:   32 LVOT Area:     3.14 cm  LV Volumes (MOD) LV vol d, MOD A2C: 101.0 ml LV vol d, MOD A4C: 101.0 ml LV vol s, MOD A2C: 48.3 ml LV vol s, MOD A4C: 39.3 ml LV SV MOD A2C:     52.7 ml LV SV MOD A4C:     101.0 ml LV SV MOD BP:      55.8 ml RIGHT VENTRICLE RV S prime:     12.20 cm/s TAPSE (M-mode): 2.4 cm  LEFT ATRIUM           Index       RIGHT ATRIUM           Index LA diam:      3.20 cm 1.69 cm/m  RA Area:     12.10 cm LA Vol (A2C): 25.3 ml 13.37 ml/m RA Volume:   28.80 ml  15.22 ml/m LA Vol (A4C): 30.8 ml 16.28 ml/m  AORTIC VALVE LVOT Vmax:   87.80 cm/s LVOT Vmean:  65.400 cm/s LVOT VTI:    0.192 m  AORTA Ao Root diam: 3.10 cm Ao Asc diam:  3.10 cm MITRAL VALVE MV Area (PHT): 3.53 cm    SHUNTS MV Decel Time: 215 msec    Systemic VTI:  0.19 m MV E velocity: 87.40 cm/s  Systemic Diam: 2.00 cm MV A velocity: 68.80 cm/s MV E/A ratio:  1.27 Olga Millers MD Electronically signed by Olga Millers MD Signature Date/Time: 11/23/2020/10:27:36 AM    Final       Joycelyn Das, MD  [facility] 11/23/2020  If 7PM-7AM, please contact night-coverage

## 2020-11-24 DIAGNOSIS — I471 Supraventricular tachycardia: Secondary | ICD-10-CM

## 2020-11-24 DIAGNOSIS — I456 Pre-excitation syndrome: Secondary | ICD-10-CM

## 2020-11-24 MED ORDER — METOCLOPRAMIDE HCL 5 MG/ML IJ SOLN
5.0000 mg | Freq: Three times a day (TID) | INTRAMUSCULAR | Status: DC
  Administered 2020-11-24: 5 mg via INTRAVENOUS
  Filled 2020-11-24 (×2): qty 2

## 2020-11-24 MED ORDER — METOPROLOL TARTRATE 12.5 MG HALF TABLET: 12.5000 mg | ORAL_TABLET | Freq: Two times a day (BID) | ORAL | Status: DC

## 2020-11-24 MED ORDER — SODIUM CHLORIDE 0.9 % IV SOLN: INTRAVENOUS | Status: AC

## 2020-11-24 MED ORDER — SIMETHICONE 80 MG PO CHEW
80.0000 mg | CHEWABLE_TABLET | Freq: Four times a day (QID) | ORAL | Status: DC | PRN
  Administered 2020-11-24 – 2020-11-25 (×3): 80 mg via ORAL
  Filled 2020-11-24 (×3): qty 1

## 2020-11-24 NOTE — Plan of Care (Signed)
?  Problem: Clinical Measurements: ?Goal: Will remain free from infection ?Outcome: Progressing ?  ?

## 2020-11-24 NOTE — Plan of Care (Signed)
  Problem: Education: Goal: Knowledge of General Education information will improve Description: Including pain rating scale, medication(s)/side effects and non-pharmacologic comfort measures Outcome: Progressing   Problem: Activity: Goal: Risk for activity intolerance will decrease Outcome: Progressing   Problem: Coping: Goal: Level of anxiety will decrease Outcome: Not Progressing

## 2020-11-24 NOTE — Progress Notes (Signed)
PROGRESS NOTE  Vicki Moore NAT:[id number] DOB: 09-16-1980 DOA: 11/19/2020 PCP: [name], MD   LOS: 4 days   Brief narrative:  Vicki Moore is a 40 y.o. female with medical history significant of asthma, depression, anxiety, chronic abdominal pain, opiate abuse presented to hospital with wheezing shortness of breath and abdominal pain.  Patient was initially seen in the ED and had felt better so she was discharged home after negative CT scan but then had increasing respiratory distress with pulse ox of 85% on room air so she was brought into the hospital and was started on CPAP.  In the ED, patient was initially put on BiPAP and was admitted to hospital for further evaluation and treatment.  Patient stated that she had ran out of her Advair for a month.  Patient was then admitted to the hospital for further evaluation and treatment.  Assessment/Plan:  Principal Problem:   Acute asthma exacerbation Active Problems:   Acute respiratory failure with hypoxia (HCC)   Acute hypoxemic respiratory failure (HCC)   Abdominal pain   SVT (supraventricular tachycardia) (HCC)   Lactic acidosis   Non-intractable vomiting   Hiatal hernia   Acute hypoxemic respiratory failure secondary to asthma exacerbation.   Was not on Advair for a month.  Initially was on BiPAP.   Continue  Xopenex, Pulmicort nebs oxygen.  Lactate was slightly elevated likely secondary to increased work of breathing.  This has improved at this time.  D-dimer was negative.  Breathing has improved overall.  Currently off oxygen at this time.  Continue bronchodilators.  Patient has been discontinued.  Patient stating that she wishes to see a pulmonary in the hospital and wants a bronchoscopy.  I explained to her that that might not be necessary at this time since she is clinically improving.  She is adamant about it.  We will discuss with pulmonary in AM.  Nausea, vomiting, persistent abdominal pain.   Patient has had  cholecystectomy in the past for abdominal pain.  GI was consulted for frequent episodes of vomiting and poor intolerance.  Continue Protonix IV twice daily.  CT scan abdomen and pelvis was negative. Beta-hCG negative, lipase negative.  Has a reducible periumbilical hernia.  History of opiate use. High narcotic seeking tendency noted.  Urine drug screen was positive for opiates and benzodiazepines.  Patient underwent EGD on 8/13/ 2022 with findings of esophagitis but no gastric ulceration.  GI recommends trial of Reglan IV 3 times daily for possible gastroparesis but continues to have diarrhea today.  We will decrease the dose of Reglan. Continue IV fluid hydration.  Patient might have a component of anxiety and depression causing excessive feeling of abdominal pain.   Hypokalemia.  Improved after replacement..  Check BMP in a.m.   Supraventricular tachycardia.  Was cardioverted enroute to the hospital.  Currently in sinus rhythm.  Electrolytes within normal limits.  Check 2D echocardiogram with LV ejection fraction of 60 to 65%.Marland Kitchen  TSH within normal limits.   Reactive leukocytosis.  No signs of infection so far.  Blood cultures negative in 3 days.  Urine analysis negative.  WBC at 9.5.   Elevated lactate.  Resolved after IV fluid hydration and oxygenation.  DVT prophylaxis: SCDs Start: 11/20/20 0602   Code Status: Full code  Family Communication:  None today.  Status is: Inpatient  Remains inpatient appropriate because:IV treatments appropriate due to intensity of illness or inability to take PO and Inpatient level of care appropriate due to severity of  illness  Dispo: The patient is from: Home              Anticipated d/c is to: Home likely in 1 to 2 days              Patient currently is not medically stable to d/c.   Difficult to place patient No  Consultants: GI  Procedures: EGD on 11/23/2020  Anti-infectives:  Valtrex  Anti-infectives (From admission, onward)    Start      Dose/Rate Route Frequency Ordered Stop   11/20/20 1400  valACYclovir (VALTREX) tablet 1,000 mg        1,000 mg Oral 2 times daily PRN 11/20/20 1402        Subjective: Today, patient was seen and examined at bedside.  Still has poor oral intolerance.  Complains of pain.  She also stated that she wishes to have a bronchoscopy evaluation during hospitalization.  Patient has remained stable breathing wise.  Objective: Vitals:   11/23/20 2142 11/24/20 0445  BP: 120/80 (!) 131/93  Pulse: 83 83  Resp: 18 18  Temp: 98 F (36.7 C) 99.7 F (37.6 C)  SpO2: 97% 97%    Intake/Output Summary (Last 24 hours) at 11/24/2020 1402 Last data filed at 11/24/2020 0600 Gross per 24 hour  Intake 340 ml  Output --  Net 340 ml    Filed Weights   11/21/20 0533 11/21/20 2254 11/23/20 0532  Weight: 85 kg 87 kg 91.1 kg   Body mass index is 37.95 kg/m.   Physical Exam:  General:  Average built, not in obvious distress, obese HENT:   No scleral pallor or icterus noted. Oral mucosa is moist.  Chest:    Diminished breath sounds bilaterally. No crackles or wheezes.  CVS: S1 &S2 heard. No murmur.  Regular rate and rhythm. Abdomen: Soft, nonspecific tenderness on palpation, small reducible umbilical hernia.  Extremities: No cyanosis, clubbing or edema.  Peripheral pulses are palpable. Psych: Alert, awake and oriented, anxious. CNS:  No cranial nerve deficits.  Power equal in all extremities.   Skin: Warm and dry.  No rashes noted.  Data Review: I have personally reviewed the following laboratory data and studies,  CBC: Recent Labs  Lab 11/19/20 0936 11/19/20 2125 11/19/20 2256 11/21/20 0756  WBC 6.6 11.9*  --  9.5  NEUTROABS  --  9.7*  --   --   HGB 9.9* 11.0* 11.9* 9.8*  HCT 31.6* 37.3 35.0* 31.7*  MCV 82.1 86.5  --  82.6  PLT 257 439*  --  263    Basic Metabolic Panel: Recent Labs  Lab 11/19/20 0936 11/19/20 2125 11/19/20 2256 11/19/20 2318 11/21/20 0756  NA 140 138 140 140 138  K  3.3* 3.4* 3.2* 3.0* 3.7  CL 102 102  --  106 103  CO2 26 9*  --  18* 24  GLUCOSE 101* 163*  --  132* 87  BUN 9 7  --  6 7  CREATININE 0.64 1.09*  --  0.86 0.73  CALCIUM 9.8 10.0  --  9.6 9.2  MG  --  1.7  --   --   --     Liver Function Tests: Recent Labs  Lab 11/19/20 0936 11/19/20 2125 11/21/20 0756  AST 81* 85* 54*  ALT 83* 84* 79*  ALKPHOS 120 128* 105  BILITOT 0.8 0.6 0.9  PROT 7.0 7.8 6.8  ALBUMIN 3.5 3.9 3.6    Recent Labs  Lab 11/19/20 0936 11/19/20 2125  LIPASE 55* 43    No results for input(s): AMMONIA in the last 168 hours. Cardiac Enzymes: No results for input(s): CKTOTAL, CKMB, CKMBINDEX, TROPONINI in the last 168 hours. BNP (last 3 results) Recent Labs    04/16/20 1006  BNP 77.5     ProBNP (last 3 results) No results for input(s): PROBNP in the last 8760 hours.  CBG: No results for input(s): GLUCAP in the last 168 hours. Recent Results (from the past 240 hour(s))  Resp Panel by RT-PCR (Flu A&B, Covid) Nasopharyngeal Swab     Status: None   Collection Time: 11/19/20  9:36 AM   Specimen: Nasopharyngeal Swab; Nasopharyngeal(NP) swabs in vial transport medium  Result Value Ref Range Status   SARS Coronavirus 2 by RT PCR NEGATIVE NEGATIVE Final    Comment: (NOTE) SARS-CoV-2 target nucleic acids are NOT DETECTED.  The SARS-CoV-2 RNA is generally detectable in upper respiratory specimens during the acute phase of infection. The lowest concentration of SARS-CoV-2 viral copies this assay can detect is 138 copies/mL. A negative result does not preclude SARS-Cov-2 infection and should not be used as the sole basis for treatment or other patient management decisions. A negative result may occur with  improper specimen collection/handling, submission of specimen other than nasopharyngeal swab, presence of viral mutation(s) within the areas targeted by this assay, and inadequate number of viral copies(<138 copies/mL). A negative result must be combined  with clinical observations, patient history, and epidemiological information. The expected result is Negative.  Fact Sheet for Patients:  BloggerCourse.com  Fact Sheet for Healthcare Providers:  SeriousBroker.it  This test is no t yet approved or cleared by the [place] FDA and  has been authorized for detection and/or diagnosis of SARS-CoV-2 by FDA under an Emergency Use Authorization (EUA). This EUA will remain  in effect (meaning this test can be used) for the duration of the COVID-19 declaration under Section 564(b)(1) of the Act, 21 U.S.C.section 360bbb-3(b)(1), unless the authorization is terminated  or revoked sooner.       Influenza A by PCR NEGATIVE NEGATIVE Final   Influenza B by PCR NEGATIVE NEGATIVE Final    Comment: (NOTE) The Xpert Xpress SARS-CoV-2/FLU/RSV plus assay is intended as an aid in the diagnosis of influenza from Nasopharyngeal swab specimens and should not be used as a sole basis for treatment. Nasal washings and aspirates are unacceptable for Xpert Xpress SARS-CoV-2/FLU/RSV testing.  Fact Sheet for Patients: BloggerCourse.com  Fact Sheet for Healthcare Providers: SeriousBroker.it  This test is not yet approved or cleared by the [place] FDA and has been authorized for detection and/or diagnosis of SARS-CoV-2 by FDA under an Emergency Use Authorization (EUA). This EUA will remain in effect (meaning this test can be used) for the duration of the COVID-19 declaration under Section 564(b)(1) of the Act, 21 U.S.C. section 360bbb-3(b)(1), unless the authorization is terminated or revoked.  Performed at [facility], [address], [place], [place] [address]   Culture, blood (routine x 2)     Status: None (Preliminary result)   Collection Time: 11/20/20  9:07 AM   Specimen: BLOOD  Result Value Ref Range Status   Specimen Description  BLOOD SITE NOT SPECIFIED  Final   Special Requests   Final    BOTTLES DRAWN AEROBIC ONLY Blood Culture results may not be optimal due to an inadequate volume of blood received in culture bottles   Culture   Final    NO GROWTH 4 DAYS Performed at [facility], [address] [place], [place] [address]    Report Status PENDING  Incomplete  Culture, blood (Routine X 2) w Reflex to ID Panel     Status: None (Preliminary result)   Collection Time: 11/21/20  7:57 AM   Specimen: BLOOD  Result Value Ref Range Status   Specimen Description BLOOD RIGHT ANTECUBITAL  Final   Special Requests   Final    BOTTLES DRAWN AEROBIC AND ANAEROBIC Blood Culture adequate volume   Culture   Final    NO GROWTH 3 DAYS Performed at [facility], [address] [place], [place] [address]    Report Status PENDING  Incomplete      Studies: ECHOCARDIOGRAM COMPLETE  Result Date: 11/23/2020    ECHOCARDIOGRAM REPORT   Patient Name:   Rainey PinesLEANNE M Mcgough Date of Exam: 11/23/2020 Medical Rec #:  [id number]          Height:       61.0 in Accession #:    [id number]         Weight:       200.8 lb Date of Birth:  04/05/1981           BSA:          1.892 m Patient Age:    40 years           BP:           115/72 mmHg Patient Gender: F                  HR:           20 bpm. Exam Location:  Inpatient Procedure: 2D Echo, Cardiac Doppler and Color Doppler Indications:    Dyspnea R06.00  History:        Patient has prior history of Echocardiogram examinations, most                 recent 11/12/2019.  Sonographer:    Renella CunasJulia Swaim RDCS Referring Phys: [id number] [facility] Jamey Harman IMPRESSIONS  1. Left ventricular ejection fraction, by estimation, is 60 to 65%. The left ventricle has normal function. The left ventricle has no regional wall motion abnormalities. Left ventricular diastolic parameters were normal.  2. Right ventricular systolic function is normal. The right ventricular size is normal.  3. The mitral valve is normal in  structure. Trivial mitral valve regurgitation. No evidence of mitral stenosis.  4. The aortic valve is tricuspid. Aortic valve regurgitation is not visualized. No aortic stenosis is present.  5. The inferior vena cava is normal in size with greater than 50% respiratory variability, suggesting right atrial pressure of 3 mmHg. FINDINGS  Left Ventricle: Left ventricular ejection fraction, by estimation, is 60 to 65%. The left ventricle has normal function. The left ventricle has no regional wall motion abnormalities. The left ventricular internal cavity size was normal in size. There is  no left ventricular hypertrophy. Left ventricular diastolic parameters were normal. Right Ventricle: The right ventricular size is normal. Right ventricular systolic function is normal. Left Atrium: Left atrial size was normal in size. Right Atrium: Right atrial size was normal in size. Pericardium: There is no evidence of pericardial effusion. Mitral Valve: The mitral valve is normal in structure. Trivial mitral valve regurgitation. No evidence of mitral valve stenosis. Tricuspid Valve: The tricuspid valve is normal in structure. Tricuspid valve regurgitation is trivial. No evidence of tricuspid stenosis. Aortic Valve: The aortic valve is tricuspid. Aortic valve regurgitation is not visualized. No  aortic stenosis is present. Pulmonic Valve: The pulmonic valve was normal in structure. Pulmonic valve regurgitation is not visualized. No evidence of pulmonic stenosis. Aorta: The aortic root is normal in size and structure. Venous: The inferior vena cava is normal in size with greater than 50% respiratory variability, suggesting right atrial pressure of 3 mmHg. IAS/Shunts: No atrial level shunt detected by color flow Doppler.  LEFT VENTRICLE PLAX 2D LVIDd:         4.50 cm      Diastology LVIDs:         3.30 cm      LV e' medial:    8.31 cm/s LV PW:         0.80 cm      LV E/e' medial:  10.5 LV IVS:        0.80 cm      LV e' lateral:    15.30 cm/s LVOT diam:     2.00 cm      LV E/e' lateral: 5.7 LV SV:         60 LV SV Index:   32 LVOT Area:     3.14 cm  LV Volumes (MOD) LV vol d, MOD A2C: 101.0 ml LV vol d, MOD A4C: 101.0 ml LV vol s, MOD A2C: 48.3 ml LV vol s, MOD A4C: 39.3 ml LV SV MOD A2C:     52.7 ml LV SV MOD A4C:     101.0 ml LV SV MOD BP:      55.8 ml RIGHT VENTRICLE RV S prime:     12.20 cm/s TAPSE (M-mode): 2.4 cm LEFT ATRIUM           Index       RIGHT ATRIUM           Index LA diam:      3.20 cm 1.69 cm/m  RA Area:     12.10 cm LA Vol (A2C): 25.3 ml 13.37 ml/m RA Volume:   28.80 ml  15.22 ml/m LA Vol (A4C): 30.8 ml 16.28 ml/m  AORTIC VALVE LVOT Vmax:   87.80 cm/s LVOT Vmean:  65.400 cm/s LVOT VTI:    0.192 m  AORTA Ao Root diam: 3.10 cm Ao Asc diam:  3.10 cm MITRAL VALVE MV Area (PHT): 3.53 cm    SHUNTS MV Decel Time: 215 msec    Systemic VTI:  0.19 m MV E velocity: 87.40 cm/s  Systemic Diam: 2.00 cm MV A velocity: 68.80 cm/s MV E/A ratio:  1.27 Olga Millers MD Electronically signed by Olga Millers MD Signature Date/Time: 11/23/2020/10:27:36 AM    Final       Joycelyn Das, MD  [facility] 11/24/2020  If 7PM-7AM, please contact night-coverage

## 2020-11-24 NOTE — Progress Notes (Signed)
Patient was claiming she has had multiple loose bowel movement and keep insisting to have a Lomotil or Loperamide. I mentioned to her that the M.D would not give immodium to her unless she has a watery bowel movement several times per day. Patient said '' my diarrhea will coming out now and run to the bathroom,nurse heard a loud long passing out of flatus ,instructed patient not flushed the bowl to assess her stool,in the bowl is mushy , well formed moderate amount of  stool but definitely not loose bowel movement  as witnessed by the N.T.Patient said this are her third bowel movement for the day since last night.

## 2020-11-24 NOTE — Consult Note (Signed)
CONSULTATION NOTE   Patient Name: Vicki Moore Date of Encounter: 11/24/2020 Cardiologist: None Electrophysiologist: None Advanced Heart Failure: None   Chief Complaint   Palpitations  Patient Profile   40 yo female with abdominal pain, nausea and vomiting, presented with dyspnea and hypoxia suggestive of asthma exacerbation, complicated by SVT.  HPI   Vicki Moore is a 40 y.o. female who is being seen today for the evaluation of SVT at the request of Dr. Tyson Babinski. This is a 40 year old female, originally from [place], with a history of asthma, anxiety, recent frequent episodes of vomiting and poor p.o. tolerance, history of opiate use and prior cholecystectomy.  She presented today with shortness of breath and hypoxia consistent with asthma exacerbation.  In route via EMS she apparently had narrow complex tachycardia with rate around 150 bpm.  I personally reviewed the EMS run sheets and this appears to be a short RP tachycardia with a concern for delta wave suggestive of WPW.  She was cardioverted successfully in the ambulance with termination of her rhythm. This afternoon, she had recurrent narrow-complex tachycardia into the 140-150's which ultimately self-terminated. EKG demonstrates NSR at 94 with delta wave, very suggestive of WPW.  PMHx   Past Medical History:  Diagnosis Date   Anxiety    Asthma    Asthma    Depression    GERD (gastroesophageal reflux disease)     Past Surgical History:  Procedure Laterality Date   CHOLECYSTECTOMY N/A 12/08/2019   Procedure: LAPAROSCOPIC CHOLECYSTECTOMY;  Surgeon: Franky Macho, MD;  Location: [facility];  Service: General;  Laterality: N/A;    FAMHx   History reviewed. No pertinent family history.  No history of tachyarrhythmia  SOCHx    reports that she has been smoking cigarettes. She has a 2.50 pack-year smoking history. She has never used smokeless tobacco. She reports current alcohol use. She reports that she does  not currently use drugs.  Outpatient Medications   No current facility-administered medications on file prior to encounter.   Current Outpatient Medications on File Prior to Encounter  Medication Sig Dispense Refill   acetaminophen (TYLENOL) 325 MG tablet Take 2 tablets (650 mg total) by mouth every 6 (six) hours as needed. 30 tablet 0   albuterol (PROVENTIL HFA;VENTOLIN HFA) 108 (90 BASE) MCG/ACT inhaler Inhale 2 puffs into the lungs every 6 (six) hours as needed for wheezing or shortness of breath. For shortness of breath     alprazolam (XANAX) 2 MG tablet Take 2 mg by mouth in the morning, at noon, and at bedtime.     Calcium Carbonate Antacid (TUMS PO) Take 1-2 tablets by mouth every hour as needed (acid reflux/heartburn).     dicyclomine (BENTYL) 20 MG tablet Take 1 tablet (20 mg total) by mouth 2 (two) times daily. 20 tablet 0   fluticasone-salmeterol (ADVAIR DISKUS) 500-50 MCG/ACT AEPB Inhale 1 puff into the lungs 2 (two) times daily. 14 each 3   ipratropium-albuterol (DUONEB) 0.5-2.5 (3) MG/3ML SOLN Take 3 mLs by nebulization 3 (three) times daily as needed for wheezing or shortness of breath.     nicotine (NICODERM CQ - DOSED IN MG/24 HOURS) 14 mg/24hr patch Place 1 patch (14 mg total) onto the skin daily. (Patient not taking: No sig reported) 28 patch 0   ondansetron (ZOFRAN ODT) 4 MG disintegrating tablet Take 1 tablet (4 mg total) by mouth every 8 (eight) hours as needed for nausea or vomiting. 12 tablet 0   pantoprazole (PROTONIX) 40 MG  tablet Take 1 tablet (40 mg total) by mouth 2 (two) times daily before a meal. 60 tablet 0   tetrahydrozoline-zinc (VISINE-AC) 0.05-0.25 % ophthalmic solution Place 1 drop into both eyes 3 (three) times daily as needed (dry eyes).     valACYclovir (VALTREX) 1000 MG tablet Take 1,000 mg by mouth 2 (two) times daily as needed (breakouts).      Inpatient Medications    Scheduled Meds:  budesonide (PULMICORT) nebulizer solution  0.25 mg Nebulization  BID   dicyclomine  20 mg Oral BID   metoCLOPramide (REGLAN) injection  5 mg Intravenous Q8H   nicotine  14 mg Transdermal Daily   pantoprazole (PROTONIX) IV  40 mg Intravenous Q12H   scopolamine  1 patch Transdermal Q72H    Continuous Infusions:  sodium chloride 100 mL/hr at 11/24/20 1637   promethazine (PHENERGAN) injection (IM or IVPB) 12.5 mg (11/23/20 0804)    PRN Meds: acetaminophen, alprazolam, calcium carbonate, levalbuterol **AND** ipratropium, ondansetron (ZOFRAN) IV, ondansetron, oxyCODONE-acetaminophen, promethazine (PHENERGAN) injection (IM or IVPB), simethicone, valACYclovir, zolpidem   ALLERGIES   Allergies  Allergen Reactions   Peanut-Containing Drug Products Hives and Shortness Of Breath   Penicillins Anaphylaxis, Hives, Nausea And Vomiting and Rash          ROS   Pertinent items noted in HPI and remainder of comprehensive ROS otherwise negative.  Vitals   Vitals:   11/23/20 2142 11/24/20 0445 11/24/20 1526 11/24/20 1751  BP: 120/80 (!) 131/93 102/62 (!) 95/59  Pulse: 83 83 (!) 129 97  Resp: 18 18 20 16   Temp: 98 F (36.7 C) 99.7 F (37.6 C) 98 F (36.7 C) 98.3 F (36.8 C)  TempSrc: Oral Oral    SpO2: 97% 97%  99%  Weight:      Height:        Intake/Output Summary (Last 24 hours) at 11/24/2020 1756 Last data filed at 11/24/2020 0600 Gross per 24 hour  Intake 340 ml  Output --  Net 340 ml   Filed Weights   11/21/20 0533 11/21/20 2254 11/23/20 0532  Weight: 85 kg 87 kg 91.1 kg    Physical Exam   General appearance: alert, no distress, and moderately obese Neck: no carotid bruit, no JVD, and thyroid not enlarged, symmetric, no tenderness/mass/nodules Lungs: clear to auscultation bilaterally Heart: regular rate and rhythm, S1, S2 normal, no murmur, click, rub or gallop Abdomen: soft, non-tender; bowel sounds normal; no masses,  no organomegaly and obese Extremities: extremities normal, atraumatic, no cyanosis or edema Pulses: 2+ and  symmetric Skin: Skin color, texture, turgor normal. No rashes or lesions Neurologic: Grossly normal Psych: Pleasant  Labs   No results found for this or any previous visit (from the past 48 hour(s)).  ECG   Short RP tachycardia, narrow complex at rate of around 150 (EMS run sheet)- EKG from the hospital shows NSR at 94, delta waves suggestive of WPW - personally reviewed  Telemetry   Sinus rhythm/tachycardia around 100- Personally Reviewed  Radiology   ECHOCARDIOGRAM COMPLETE  Result Date: 11/23/2020    ECHOCARDIOGRAM REPORT   Patient Name:   OLUWASEMILORE PASCUZZI Date of Exam: 11/23/2020 Medical Rec #:  11/25/2020          Height:       61.0 in Accession #:    [id number]         Weight:       200.8 lb Date of Birth:  1980/07/14  BSA:          1.892 m Patient Age:    40 years           BP:           115/72 mmHg Patient Gender: F                  HR:           20 bpm. Exam Location:  Inpatient Procedure: 2D Echo, Cardiac Doppler and Color Doppler Indications:    Dyspnea R06.00  History:        Patient has prior history of Echocardiogram examinations, most                 recent 11/12/2019.  Sonographer:    Renella CunasJulia Swaim RDCS Referring Phys: [id number] [facility] POKHREL IMPRESSIONS  1. Left ventricular ejection fraction, by estimation, is 60 to 65%. The left ventricle has normal function. The left ventricle has no regional wall motion abnormalities. Left ventricular diastolic parameters were normal.  2. Right ventricular systolic function is normal. The right ventricular size is normal.  3. The mitral valve is normal in structure. Trivial mitral valve regurgitation. No evidence of mitral stenosis.  4. The aortic valve is tricuspid. Aortic valve regurgitation is not visualized. No aortic stenosis is present.  5. The inferior vena cava is normal in size with greater than 50% respiratory variability, suggesting right atrial pressure of 3 mmHg. FINDINGS  Left Ventricle: Left ventricular ejection fraction, by  estimation, is 60 to 65%. The left ventricle has normal function. The left ventricle has no regional wall motion abnormalities. The left ventricular internal cavity size was normal in size. There is  no left ventricular hypertrophy. Left ventricular diastolic parameters were normal. Right Ventricle: The right ventricular size is normal. Right ventricular systolic function is normal. Left Atrium: Left atrial size was normal in size. Right Atrium: Right atrial size was normal in size. Pericardium: There is no evidence of pericardial effusion. Mitral Valve: The mitral valve is normal in structure. Trivial mitral valve regurgitation. No evidence of mitral valve stenosis. Tricuspid Valve: The tricuspid valve is normal in structure. Tricuspid valve regurgitation is trivial. No evidence of tricuspid stenosis. Aortic Valve: The aortic valve is tricuspid. Aortic valve regurgitation is not visualized. No aortic stenosis is present. Pulmonic Valve: The pulmonic valve was normal in structure. Pulmonic valve regurgitation is not visualized. No evidence of pulmonic stenosis. Aorta: The aortic root is normal in size and structure. Venous: The inferior vena cava is normal in size with greater than 50% respiratory variability, suggesting right atrial pressure of 3 mmHg. IAS/Shunts: No atrial level shunt detected by color flow Doppler.  LEFT VENTRICLE PLAX 2D LVIDd:         4.50 cm      Diastology LVIDs:         3.30 cm      LV e' medial:    8.31 cm/s LV PW:         0.80 cm      LV E/e' medial:  10.5 LV IVS:        0.80 cm      LV e' lateral:   15.30 cm/s LVOT diam:     2.00 cm      LV E/e' lateral: 5.7 LV SV:         60 LV SV Index:   32 LVOT Area:     3.14 cm  LV Volumes (MOD) LV vol d, MOD  A2C: 101.0 ml LV vol d, MOD A4C: 101.0 ml LV vol s, MOD A2C: 48.3 ml LV vol s, MOD A4C: 39.3 ml LV SV MOD A2C:     52.7 ml LV SV MOD A4C:     101.0 ml LV SV MOD BP:      55.8 ml RIGHT VENTRICLE RV S prime:     12.20 cm/s TAPSE (M-mode): 2.4 cm  LEFT ATRIUM           Index       RIGHT ATRIUM           Index LA diam:      3.20 cm 1.69 cm/m  RA Area:     12.10 cm LA Vol (A2C): 25.3 ml 13.37 ml/m RA Volume:   28.80 ml  15.22 ml/m LA Vol (A4C): 30.8 ml 16.28 ml/m  AORTIC VALVE LVOT Vmax:   87.80 cm/s LVOT Vmean:  65.400 cm/s LVOT VTI:    0.192 m  AORTA Ao Root diam: 3.10 cm Ao Asc diam:  3.10 cm MITRAL VALVE MV Area (PHT): 3.53 cm    SHUNTS MV Decel Time: 215 msec    Systemic VTI:  0.19 m MV E velocity: 87.40 cm/s  Systemic Diam: 2.00 cm MV A velocity: 68.80 cm/s MV E/A ratio:  1.27 Olga Millers MD Electronically signed by Olga Millers MD Signature Date/Time: 11/23/2020/10:27:36 AM    Final     Cardiac Studies   See echo above  Impression   Principal Problem:   Acute asthma exacerbation Active Problems:   Acute respiratory failure with hypoxia (HCC)   Acute hypoxemic respiratory failure (HCC)   Abdominal pain   SVT (supraventricular tachycardia) (HCC)   Lactic acidosis   Non-intractable vomiting   Hiatal hernia   WPW (Wolff-Parkinson-White syndrome)   Recommendation   Telemetry and EMS run strips demonstrate a short RP tachycardia, likely AVRT and the EKG is classically showing a delta wave and findings of pre-excitation consistent with WPW.  She has never had palpitations such as this before. This may have been exacerbated by hypoxemia or beta agonists and treatment for acute asthma exacerbation. She was successfully cardioverted by EMS and self-terminated today. Echo performed yesterday demonstrates normal LVEF 60 to 65% with normal atrial size and no significant valvular pathology. She is currently maintaining sinus rhythm. Will plan to start low dose selective beta blocker.  I will ask cardiac EP to see her in the AM tomorrow - would strongly consider evaluation for WPW ablation.  Thanks for the consultation.  Time Spent Directly with Patient:  I have spent a total of 45 minutes with the patient reviewing hospital  notes, telemetry, EKGs, labs and examining the patient as well as establishing an assessment and plan that was discussed personally with the patient.  > 50% of time was spent in direct patient care.  Length of Stay:  LOS: 4 days   Chrystie Nose, MD, [facility], FACP  [place]  [facility]  Medical Director of the [facility]  Card[facility]ovascular [facility] Reduction Clinic Diplomate of the American Board of Clinical Lipidology Attending Cardiologist  Direct Dial: [phone]  Fax: [phone]  Website:  [email or web address] 11/24/2020, 5:56 PM

## 2020-11-25 DIAGNOSIS — J4540 Moderate persistent asthma, uncomplicated: Secondary | ICD-10-CM | POA: Diagnosis not present

## 2020-11-25 LAB — BASIC METABOLIC PANEL
Anion gap: 13 (ref 5–15)
BUN: 8 mg/dL (ref 6–20)
CO2: 17 mmol/L — ABNORMAL LOW (ref 22–32)
Calcium: 8.4 mg/dL — ABNORMAL LOW (ref 8.9–10.3)
Chloride: 107 mmol/L (ref 98–111)
Creatinine, Ser: 0.73 mg/dL (ref 0.44–1.00)
GFR, Estimated: >60 mL/min (ref >60)
Glucose, Bld: 70 mg/dL (ref 70–99)
Potassium: 3.5 mmol/L (ref 3.5–5.1)
Sodium: 137 mmol/L (ref 135–145)

## 2020-11-25 LAB — CBC
HCT: 31.7 % — ABNORMAL LOW (ref 36.0–46.0)
Hemoglobin: 9.7 g/dL — ABNORMAL LOW (ref 12.0–15.0)
MCH: 25.2 pg — ABNORMAL LOW (ref 26.0–34.0)
MCHC: 30.6 g/dL (ref 30.0–36.0)
MCV: 82.3 fL (ref 80.0–100.0)
Platelets: 336 10*3/uL (ref 150–400)
RBC: 3.85 MIL/uL — ABNORMAL LOW (ref 3.87–5.11)
RDW: 19.9 % — ABNORMAL HIGH (ref 11.5–15.5)
WBC: 7.3 10*3/uL (ref 4.0–10.5)
nRBC: 0 % (ref 0.0–0.2)

## 2020-11-25 LAB — CULTURE, BLOOD (ROUTINE X 2): Culture: NO GROWTH

## 2020-11-25 LAB — PHOSPHORUS: Phosphorus: 3.8 mg/dL (ref 2.5–4.6)

## 2020-11-25 LAB — MAGNESIUM: Magnesium: 1.6 mg/dL — ABNORMAL LOW (ref 1.7–2.4)

## 2020-11-25 MED ORDER — POTASSIUM CHLORIDE 10 MEQ/100ML IV SOLN
10.0000 meq | INTRAVENOUS | Status: DC
Start: 2020-11-25 — End: 2020-11-25
  Administered 2020-11-25: 10 meq via INTRAVENOUS
  Filled 2020-11-25: qty 100

## 2020-11-25 MED ORDER — METOPROLOL SUCCINATE ER 25 MG PO TB24
25.0000 mg | ORAL_TABLET | Freq: Every day | ORAL | Status: DC
  Administered 2020-11-25 – 2020-11-28 (×4): 25 mg via ORAL
  Filled 2020-11-25 (×4): qty 1

## 2020-11-25 MED ORDER — POTASSIUM CHLORIDE CRYS ER 20 MEQ PO TBCR
40.0000 meq | EXTENDED_RELEASE_TABLET | Freq: Once | ORAL | Status: AC
  Administered 2020-11-25: 40 meq via ORAL
  Filled 2020-11-25: qty 2

## 2020-11-25 MED ORDER — METOCLOPRAMIDE HCL 5 MG/ML IJ SOLN: 5.0000 mg | Freq: Two times a day (BID) | INTRAMUSCULAR | Status: DC

## 2020-11-25 MED ORDER — ARFORMOTEROL TARTRATE 15 MCG/2ML IN NEBU
15.0000 ug | INHALATION_SOLUTION | Freq: Two times a day (BID) | RESPIRATORY_TRACT | Status: DC
  Administered 2020-11-25 – 2020-11-28 (×5): 15 ug via RESPIRATORY_TRACT
  Filled 2020-11-25 (×8): qty 2

## 2020-11-25 MED ORDER — HYDROXYZINE HCL 10 MG PO TABS
10.0000 mg | ORAL_TABLET | Freq: Once | ORAL | Status: AC
  Administered 2020-11-25: 10 mg via ORAL
  Filled 2020-11-25: qty 1

## 2020-11-25 MED ORDER — KETOROLAC TROMETHAMINE 15 MG/ML IJ SOLN
15.0000 mg | Freq: Once | INTRAMUSCULAR | Status: AC
  Administered 2020-11-25: 15 mg via INTRAVENOUS
  Filled 2020-11-25: qty 1

## 2020-11-25 MED ORDER — MAGNESIUM SULFATE 2 GM/50ML IV SOLN
2.0000 g | Freq: Once | INTRAVENOUS | Status: AC
  Administered 2020-11-25: 2 g via INTRAVENOUS
  Filled 2020-11-25: qty 50

## 2020-11-25 MED ORDER — LOPERAMIDE HCL 2 MG PO CAPS
2.0000 mg | ORAL_CAPSULE | ORAL | Status: DC | PRN
  Administered 2020-11-25 – 2020-11-28 (×3): 2 mg via ORAL
  Filled 2020-11-25 (×3): qty 1

## 2020-11-25 NOTE — Consult Note (Signed)
NAME:  Vicki Moore, MRN:  [id number], DOB:  [birth year], LOS: 5 ADMISSION DATE:  11/19/2020, CONSULTATION DATE:  11/25/20 REFERRING MD:  TRH, CHIEF COMPLAINT:  asthma    History of Present Illness:  40yo female with hx ashtma, anxiety, chronic pain, opioid abuse who initially presented 8/9 with c/o SOB, wheezing and abd pain after running out of advair. She was admitted and treated for asthma exacerbation, initially requiring bipap. Course has been c/b abd pain/nausea/vomiting and tachycardia. Pt requested pulmonary consultation.   Pertinent  Medical History   has a past medical history of Anxiety, Asthma, Asthma, Depression, and GERD (gastroesophageal reflux disease).   Significant Hospital Events: Including procedures, antibiotic start and stop dates in addition to other pertinent events     Interim History / Subjective:  Respiratory status improved. On RA Told by urgent care several years ago that she has "unidentified organism" in her R lung and has always had trouble with it. Would like FOB while in hospital.    Objective   Blood pressure 122/62, pulse 91, temperature 98.5 F (36.9 C), temperature source Oral, resp. rate 18, height 5\' 1"  (1.549 m), weight 91.1 kg, SpO2 95 %.        Intake/Output Summary (Last 24 hours) at 11/25/2020 1152 Last data filed at 11/25/2020 1114 Gross per 24 hour  Intake 1560 ml  Output --  Net 1560 ml   Filed Weights   11/21/20 0533 11/21/20 2254 11/23/20 0532  Weight: 85 kg 87 kg 91.1 kg    Examination: General: wdwn female, NAD  HENT: mm moist, no JVD  Lungs: resps even non labored on RA, mild expiratory wheezes, upper airway wheeze  Cardiovascular: s1s2 rrr Abdomen: soft, non tender  Extremities: warm and dry, no edema  Neuro: awake, alert, MAE    Resolved Hospital Problem list     Assessment & Plan:  Acute hypoxic respiratory failure - improved. Asthma without acute exacerbation - ?some component VCD -admitted end of July  for exacerbation, discharged with albuterol fluticasone-salmeterol, prednisone  -says she ran out of advair  P -continue pulmicort, xopenex -IS  -is not requiring supplemental O2 - OP referral w/u with PFT's -respiratory status stable    Labs   CBC: Recent Labs  Lab 11/19/20 0936 11/19/20 2125 11/19/20 2256 11/21/20 0756 11/25/20 0940  WBC 6.6 11.9*  --  9.5 7.3  NEUTROABS  --  9.7*  --   --   --   HGB 9.9* 11.0* 11.9* 9.8* 9.7*  HCT 31.6* 37.3 35.0* 31.7* 31.7*  MCV 82.1 86.5  --  82.6 82.3  PLT 257 439*  --  263 336    Basic Metabolic Panel: Recent Labs  Lab 11/19/20 0936 11/19/20 2125 11/19/20 2256 11/19/20 2318 11/21/20 0756 11/25/20 0404  NA 140 138 140 140 138 137  K 3.3* 3.4* 3.2* 3.0* 3.7 3.5  CL 102 102  --  106 103 107  CO2 26 9*  --  18* 24 17*  GLUCOSE 101* 163*  --  132* 87 70  BUN 9 7  --  6 7 8   CREATININE 0.64 1.09*  --  0.86 0.73 0.73  CALCIUM 9.8 10.0  --  9.6 9.2 8.4*  MG  --  1.7  --   --   --  1.6*  PHOS  --   --   --   --   --  3.8   GFR: Estimated Creatinine Clearance: 97 mL/min (by C-G formula based on SCr  of 0.73 mg/dL). Recent Labs  Lab 11/19/20 0936 11/19/20 2125 11/19/20 2249 11/19/20 2340 11/20/20 0907 11/21/20 0756 11/25/20 0940  WBC 6.6 11.9*  --   --   --  9.5 7.3  LATICACIDVEN  --   --  >11.0* 6.5* 0.8  --   --     Liver Function Tests: Recent Labs  Lab 11/19/20 0936 11/19/20 2125 11/21/20 0756  AST 81* 85* 54*  ALT 83* 84* 79*  ALKPHOS 120 128* 105  BILITOT 0.8 0.6 0.9  PROT 7.0 7.8 6.8  ALBUMIN 3.5 3.9 3.6   Recent Labs  Lab 11/19/20 0936 11/19/20 2125  LIPASE 55* 43   No results for input(s): AMMONIA in the last 168 hours.  ABG    Component Value Date/Time   PHART 7.287 (L) 08/15/2010 0444   PCO2ART 47.0 (H) 08/15/2010 0444   PO2ART 129.0 (H) 08/15/2010 0444   HCO3 13.1 (L) 11/19/2020 2256   TCO2 14 (L) 11/19/2020 2256   ACIDBASEDEF 11.0 (H) 11/19/2020 2256   O2SAT 97.0 11/19/2020 2256      Coagulation Profile: No results for input(s): INR, PROTIME in the last 168 hours.  Cardiac Enzymes: No results for input(s): CKTOTAL, CKMB, CKMBINDEX, TROPONINI in the last 168 hours.  HbA1C: Hgb A1c MFr Bld  Date/Time Value Ref Range Status  11/13/2019 03:50 AM 5.2 4.8 - 5.6 % Final    Comment:    (NOTE) Pre diabetes:          5.7%-6.4%  Diabetes:              >6.4%  Glycemic control for   <7.0% adults with diabetes   08/17/2010 01:20 PM  <5.7 % Final   5.0 (NOTE)                                                                       According to the ADA Clinical Practice Recommendations for 2011, when HbA1c is used as a screening test:   >=6.5%   Diagnostic of Diabetes Mellitus           (if abnormal result  is confirmed)  5.7-6.4%   Increased risk of developing Diabetes Mellitus  References:Diagnosis and Classification of Diabetes Mellitus,Diabetes Care,2011,34(Suppl 1):S62-S69 and Standards of Medical Care in         Diabetes - 2011,Diabetes Care,2011,34  (Suppl 1):S11-S61.    CBG: No results for input(s): GLUCAP in the last 168 hours.  Review of Systems:   As per HPI - All other systems reviewed and were neg.    Past Medical History:  She,  has a past medical history of Anxiety, Asthma, Asthma, Depression, and GERD (gastroesophageal reflux disease).   Surgical History:   Past Surgical History:  Procedure Laterality Date   CHOLECYSTECTOMY N/A 12/08/2019   Procedure: LAPAROSCOPIC CHOLECYSTECTOMY;  Surgeon: Franky Macho, MD;  Location: [facility];  Service: General;  Laterality: N/A;     Social History:   reports that she has been smoking cigarettes. She has a 2.50 pack-year smoking history. She has never used smokeless tobacco. She reports current alcohol use. She reports that she does not currently use drugs.   Family History:  Her family history is not on file.   Allergies Allergies  Allergen Reactions   Peanut-Containing Drug Products Hives and Shortness Of  Breath   Penicillins Anaphylaxis, Hives, Nausea And Vomiting and Rash           Home Medications  Prior to Admission medications   Medication Sig Start Date End Date Taking? Authorizing Provider  acetaminophen (TYLENOL) 325 MG tablet Take 2 tablets (650 mg total) by mouth every 6 (six) hours as needed. 11/19/20   Sloan Leiter, DO  albuterol (PROVENTIL HFA;VENTOLIN HFA) 108 (90 BASE) MCG/ACT inhaler Inhale 2 puffs into the lungs every 6 (six) hours as needed for wheezing or shortness of breath. For shortness of breath    [provider]  alprazolam Prudy Feeler) 2 MG tablet Take 2 mg by mouth in the morning, at noon, and at bedtime.    [provider]  Calcium Carbonate Antacid (TUMS PO) Take 1-2 tablets by mouth every hour as needed (acid reflux/heartburn).    [provider]  dicyclomine (BENTYL) 20 MG tablet Take 1 tablet (20 mg total) by mouth 2 (two) times daily. 11/19/20   Sloan Leiter, DO  fluticasone-salmeterol (ADVAIR DISKUS) 500-50 MCG/ACT AEPB Inhale 1 puff into the lungs 2 (two) times daily. 11/12/20   Noralee Stain, DO  ipratropium-albuterol (DUONEB) 0.5-2.5 (3) MG/3ML SOLN Take 3 mLs by nebulization 3 (three) times daily as needed for wheezing or shortness of breath. 10/11/19   [provider]  nicotine (NICODERM CQ - DOSED IN MG/24 HOURS) 14 mg/24hr patch Place 1 patch (14 mg total) onto the skin daily. Patient not taking: No sig reported 08/07/20   Pokhrel, Rebekah Chesterfield, MD  ondansetron (ZOFRAN ODT) 4 MG disintegrating tablet Take 1 tablet (4 mg total) by mouth every 8 (eight) hours as needed for nausea or vomiting. 11/19/20   Sloan Leiter, DO  pantoprazole (PROTONIX) 40 MG tablet Take 1 tablet (40 mg total) by mouth 2 (two) times daily before a meal. 11/15/19   Amin, Loura Halt, MD  tetrahydrozoline-zinc (VISINE-AC) 0.05-0.25 % ophthalmic solution Place 1 drop into both eyes 3 (three) times daily as needed (dry eyes).    [provider]   valACYclovir (VALTREX) 1000 MG tablet Take 1,000 mg by mouth 2 (two) times daily as needed (breakouts). 02/09/20   [provider]     Dirk Dress, NP Pulmonary/Critical Care Medicine  11/25/2020  11:52 AM

## 2020-11-25 NOTE — Progress Notes (Addendum)
PROGRESS NOTE  Vicki Moore CNO:[id number] DOB: 1981-03-20 DOA: 11/19/2020 PCP: [name], MD   LOS: 5 days   Brief narrative:  Vicki Moore is a 40 y.o. female with medical history significant of asthma, depression, anxiety, chronic abdominal pain, opiate abuse presented to hospital with wheezing shortness of breath and abdominal pain.  Patient was initially seen in the ED and had felt better so she was discharged home after negative CT scan but then had increasing respiratory distress with pulse ox of 85% on room air so she was brought into the hospital and was started on CPAP.  In the ED, patient was initially put on BiPAP and was admitted to hospital for further evaluation and treatment.  Patient stated that she had ran out of her Advair for a month.  Patient was then admitted to the hospital for further evaluation and treatment.  During hospitalization, patient persisted to have nausea and vomiting so GI was consulted.  EGD was done.  Patient was thought to have gastroparesis/anxiety depression related/irritable bowel syndrome.  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  Patient also wishes to have a pulmonary consultation during hospitalization.  Assessment/Plan:  Principal Problem:   Acute asthma exacerbation Active Problems:   Acute respiratory failure with hypoxia (HCC)   Acute hypoxemic respiratory failure (HCC)   Abdominal pain   SVT (supraventricular tachycardia) (HCC)   Lactic acidosis   Non-intractable vomiting   Hiatal hernia   WPW (Wolff-Parkinson-White syndrome)  Acute hypoxemic respiratory failure secondary to asthma exacerbation.   Was not on Advair for a month.  Initially was on BiPAP.   Continue  Xopenex, Pulmicort nebs oxygen.  Initially lactate was elevated which has improved.  D-dimer was negative.  Overall  improved at this time.  Patient strongly wishes pulmonary consultation.  I spoke with pulmonary regarding consultation.  She stated that she was in the hospital when she was a kid and needed a bronchoscopy.    Nausea, vomiting, persistent abdominal pain.   Patient has had cholecystectomy in the past for abdominal pain.  GI was consulted for her symptoms.  On clears.  On Protonix IV twice a day.  Reglan has been initiated with a decreased dose since yesterday.  Patient likely has functional abdominal pain as well/irritable bowel syndrome.  We will try to to avoid narcotics if possible.  Patient might benefit from addressing her anxiety depression and psychological issues as outpatient.    Multiple episodes of diarrhea after starting Reglan.  Will hold.  Give 1 dose of low-dose Imodium.  Hypokalemia.  Improved.  Will a.m. for potassium level more than 4.  Able to tolerate IV so we will give p.o. today.  Hypomagnesemia.  We will replenish.  Aim for magnesium level more than 2.   Supraventricular tachycardia.  Was cardioverted enroute to the hospital.  Had episodes of tachycardia 8 14 2022 and EKG showed WPW syndrome.  Cardiology was consulted yesterday.  Plan is electrophysiology consultation.  Magnesium slightly low.  Potassium borderline low.  We will replenish.  Check 2D echocardiogram with LV ejection fraction of 60 to 65%.Marland Kitchen  TSH within normal limits.  Patient has been started on metoprolol 12.5 mg twice a day   Reactive leukocytosis.  Resolved.  Likely reactive.  DVT prophylaxis: SCDs Start: 11/20/20 0602   Code Status: Full code  Family Communication:  None today.  Status  is: Inpatient  Remains inpatient appropriate because:IV treatments appropriate due to intensity of illness or inability to take PO and Inpatient level of care appropriate due to severity of illness  Dispo: The patient is from: Home              Anticipated d/c is to: Home likely in 1 to 2 days, will get  pulmonary/electrophysiology cardiology consultation.              Patient currently is not medically stable to d/c.   Difficult to place patient No  Consultants: GI  Procedures: EGD on 11/23/2020  Anti-infectives:  Valtrex  Anti-infectives (From admission, onward)    Start     Dose/Rate Route Frequency Ordered Stop   11/20/20 1400  valACYclovir (VALTREX) tablet 1,000 mg        1,000 mg Oral 2 times daily PRN 11/20/20 1402        Subjective:  Today, patient was seen and examined at bedside.  Patient strongly wishes a pulmonary consultation.  Had tachycardia yesterday.  Complains of few episodes of diarrhea today.  Still has GI upset and discomfort.  Wishes to try more solid food today.  Objective: Vitals:   11/25/20 0617 11/25/20 0809  BP: 102/63   Pulse: 91   Resp: 18   Temp: 98.5 F (36.9 C)   SpO2: 94% 96%    Intake/Output Summary (Last 24 hours) at 11/25/2020 1002 Last data filed at 11/25/2020 0740 Gross per 24 hour  Intake 1340 ml  Output --  Net 1340 ml    Filed Weights   11/21/20 0533 11/21/20 2254 11/23/20 0532  Weight: 85 kg 87 kg 91.1 kg   Body mass index is 37.95 kg/m.   Physical Exam:  General: Obese, not in obvious distress HENT:   No scleral pallor or icterus noted. Oral mucosa is moist.  Chest: Diminished breath sounds bilaterally. CVS: S1 &S2 heard. No murmur.  Regular rate and rhythm. Abdomen: Soft, nonspecific tenderness on palpation, small reducible umbilical hernia nondistended.  Bowel sounds are heard.   Extremities: No cyanosis, clubbing or edema.  Peripheral pulses are palpable. Psych: Alert, awake and oriented, mildly anxious. CNS:  No cranial nerve deficits.  Power equal in all extremities.   Skin: Warm and dry.  No rashes noted.  Data Review: I have personally reviewed the following laboratory data and studies,  CBC: Recent Labs  Lab 11/19/20 0936 11/19/20 2125 11/19/20 2256 11/21/20 0756  WBC 6.6 11.9*  --  9.5   NEUTROABS  --  9.7*  --   --   HGB 9.9* 11.0* 11.9* 9.8*  HCT 31.6* 37.3 35.0* 31.7*  MCV 82.1 86.5  --  82.6  PLT 257 439*  --  263    Basic Metabolic Panel: Recent Labs  Lab 11/19/20 0936 11/19/20 2125 11/19/20 2256 11/19/20 2318 11/21/20 0756 11/25/20 0404  NA 140 138 140 140 138 137  K 3.3* 3.4* 3.2* 3.0* 3.7 3.5  CL 102 102  --  106 103 107  CO2 26 9*  --  18* 24 17*  GLUCOSE 101* 163*  --  132* 87 70  BUN 9 7  --  6 7 8   CREATININE 0.64 1.09*  --  0.86 0.73 0.73  CALCIUM 9.8 10.0  --  9.6 9.2 8.4*  MG  --  1.7  --   --   --  1.6*  PHOS  --   --   --   --   --  3.8    Liver Function Tests: Recent Labs  Lab 11/19/20 0936 11/19/20 2125 11/21/20 0756  AST 81* 85* 54*  ALT 83* 84* 79*  ALKPHOS 120 128* 105  BILITOT 0.8 0.6 0.9  PROT 7.0 7.8 6.8  ALBUMIN 3.5 3.9 3.6    Recent Labs  Lab 11/19/20 0936 11/19/20 2125  LIPASE 55* 43    No results for input(s): AMMONIA in the last 168 hours. Cardiac Enzymes: No results for input(s): CKTOTAL, CKMB, CKMBINDEX, TROPONINI in the last 168 hours. BNP (last 3 results) Recent Labs    04/16/20 1006  BNP 77.5     ProBNP (last 3 results) No results for input(s): PROBNP in the last 8760 hours.  CBG: No results for input(s): GLUCAP in the last 168 hours. Recent Results (from the past 240 hour(s))  Resp Panel by RT-PCR (Flu A&B, Covid) Nasopharyngeal Swab     Status: None   Collection Time: 11/19/20  9:36 AM   Specimen: Nasopharyngeal Swab; Nasopharyngeal(NP) swabs in vial transport medium  Result Value Ref Range Status   SARS Coronavirus 2 by RT PCR NEGATIVE NEGATIVE Final    Comment: (NOTE) SARS-CoV-2 target nucleic acids are NOT DETECTED.  The SARS-CoV-2 RNA is generally detectable in upper respiratory specimens during the acute phase of infection. The lowest concentration of SARS-CoV-2 viral copies this assay can detect is 138 copies/mL. A negative result does not preclude SARS-Cov-2 infection and  should not be used as the sole basis for treatment or other patient management decisions. A negative result may occur with  improper specimen collection/handling, submission of specimen other than nasopharyngeal swab, presence of viral mutation(s) within the areas targeted by this assay, and inadequate number of viral copies(<138 copies/mL). A negative result must be combined with clinical observations, patient history, and epidemiological information. The expected result is Negative.  Fact Sheet for Patients:  BloggerCourse.com  Fact Sheet for Healthcare Providers:  SeriousBroker.it  This test is no t yet approved or cleared by the [place] FDA and  has been authorized for detection and/or diagnosis of SARS-CoV-2 by FDA under an Emergency Use Authorization (EUA). This EUA will remain  in effect (meaning this test can be used) for the duration of the COVID-19 declaration under Section 564(b)(1) of the Act, 21 U.S.C.section 360bbb-3(b)(1), unless the authorization is terminated  or revoked sooner.       Influenza A by PCR NEGATIVE NEGATIVE Final   Influenza B by PCR NEGATIVE NEGATIVE Final    Comment: (NOTE) The Xpert Xpress SARS-CoV-2/FLU/RSV plus assay is intended as an aid in the diagnosis of influenza from Nasopharyngeal swab specimens and should not be used as a sole basis for treatment. Nasal washings and aspirates are unacceptable for Xpert Xpress SARS-CoV-2/FLU/RSV testing.  Fact Sheet for Patients: BloggerCourse.com  Fact Sheet for Healthcare Providers: SeriousBroker.it  This test is not yet approved or cleared by the [place] FDA and has been authorized for detection and/or diagnosis of SARS-CoV-2 by FDA under an Emergency Use Authorization (EUA). This EUA will remain in effect (meaning this test can be used) for the duration of the COVID-19 declaration  under Section 564(b)(1) of the Act, 21 U.S.C. section 360bbb-3(b)(1), unless the authorization is terminated or revoked.  Performed at [facility], [address] [place], [place] [address]   Culture, blood (routine x 2)     Status: None   Collection Time: 11/20/20  9:07 AM   Specimen: BLOOD  Result Value Ref Range Status  Specimen Description BLOOD SITE NOT SPECIFIED  Final   Special Requests   Final    BOTTLES DRAWN AEROBIC ONLY Blood Culture results may not be optimal due to an inadequate volume of blood received in culture bottles   Culture   Final    NO GROWTH 5 DAYS Performed at [facility], [address] [place], [place] [address]    Report Status 11/25/2020 FINAL  Final  Culture, blood (Routine X 2) w Reflex to ID Panel     Status: None (Preliminary result)   Collection Time: 11/21/20  7:57 AM   Specimen: BLOOD  Result Value Ref Range Status   Specimen Description BLOOD RIGHT ANTECUBITAL  Final   Special Requests   Final    BOTTLES DRAWN AEROBIC AND ANAEROBIC Blood Culture adequate volume   Culture   Final    NO GROWTH 4 DAYS Performed at [facility], [address], [place], [place] [address]    Report Status PENDING  Incomplete      Studies: No results found.    Joycelyn Das, MD  [facility] 11/25/2020  If 7PM-7AM, please contact night-coverage

## 2020-11-25 NOTE — TOC Initial Note (Addendum)
Transition of Care [facility]) - Initial/Assessment Note    Patient Details  Name: Vicki Moore MRN: [id number] Date of Birth: 16-Jan-1981  [facility]) CM/SW Contact:    Tom-Johnson, Hershal Coria, RN Phone Number: 11/25/2020, 11:04 AM  Clinical Narrative:                 Spoke with patient about TOC needs when medically ready to be discharged. Patient states she lives with her boyfriend and 55 yr old son. Boyfriend and dad takes her to and from appointments and errands. Very anxious. States she grew up in [place] and things that happened to her when she was little are surfacing. States she does not want to talk with anyone at this time but she knows she has to. Resources given to her for outside counseling when she is ready.Concerned about her PCP as she does not want to go back to the one she is with. States current PCP is Dr. Alvis Lemmings. Has some difficulty with paying for her meds. Patient has Medicaid. Provided resources for outside counseling on AVS for patient to follow up. Will follow up for any TOC needs.  Expected Discharge Plan: Home/Self Care Barriers to Discharge: Continued Medical Work up   Patient Goals and CMS Choice Patient states their goals for this hospitalization and ongoing recovery are:: To go home with family.   Choice offered to / list presented to : NA  Expected Discharge Plan and Services Expected Discharge Plan: Home/Self Care In-house Referral: NA Discharge Planning Services: NA Post Acute Care Choice: NA Living arrangements for the past 2 months: Single Family Home                 DME Arranged: N/A DME Agency: NA       HH Arranged: NA HH Agency: NA        Prior Living Arrangements/Services Living arrangements for the past 2 months: Single Family Home Lives with:: Minor Children, Media planner (Boyfriend.) Patient language and need for interpreter reviewed:: Yes Do you feel safe going back to the place where you  live?: Yes      Need for Family Participation in Patient Care: Yes (Comment) Care giver support system in place?: Yes (comment) Current home services: DME, Other (comment) ([employer], Medical laboratory scientific officer) Criminal Activity/Legal Involvement Pertinent to Current Situation/Hospitalization: No - Comment as needed  Activities of Daily Living Home Assistive Devices/Equipment: Cane (specify quad or straight) ADL Screening (condition at time of admission) Patient's cognitive ability adequate to safely complete daily activities?: Yes Is the patient deaf or have difficulty hearing?: No Does the patient have difficulty seeing, even when wearing glasses/contacts?: No Does the patient have difficulty concentrating, remembering, or making decisions?: No Patient able to express need for assistance with ADLs?: Yes Does the patient have difficulty dressing or bathing?: No Independently performs ADLs?: Yes (appropriate for developmental age) Does the patient have difficulty walking or climbing stairs?: Yes Weakness of Legs: Both Weakness of Arms/Hands: None  Permission Sought/Granted Permission sought to share information with : Case Manager Permission granted to share information with : Yes, Verbal Permission Granted              Emotional Assessment Appearance:: Appears stated age Attitude/Demeanor/Rapport: Engaged, Other (comment) (Anxious.) Affect (typically observed): Accepting Orientation: : Oriented to Self, Oriented to Place, Oriented to  Time, Oriented to Situation   Psych Involvement: No (comment)  Admission diagnosis:  Respiratory distress [R06.03] Acute respiratory failure with hypoxia (HCC) [J96.01] AKI (  acute kidney injury) (HCC) [N17.9] Acute asthma exacerbation [J45.901] Patient Active Problem List   Diagnosis Date Noted   WPW (Wolff-Parkinson-White syndrome) 11/24/2020   Hiatal hernia    Non-intractable vomiting    Acute asthma exacerbation 11/20/2020   Acute hypoxemic respiratory  failure (HCC) 11/20/2020   Abdominal pain 11/20/2020   SVT (supraventricular tachycardia) (HCC) 11/20/2020   Lactic acidosis 11/20/2020   Acute respiratory failure with hypoxia (HCC) 11/10/2020   Elevated LFTs 11/10/2020   Opioid abuse (HCC) 11/10/2020   Chronic prescription opiate use 08/04/2020   Benzodiazepine withdrawal (HCC) 08/03/2020   Chronic pain 08/03/2020   Hypokalemia 08/03/2020   Alcohol abuse 08/03/2020   Tobacco abuse 08/03/2020   Anxiety disorder    Nephrolithiasis 01/10/2020   Biliary colic    Right upper quadrant pain 11/08/2019   RUQ pain 11/07/2019   GERD (gastroesophageal reflux disease) 11/07/2019   Bilateral foot pain 12/08/2017   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   Hypoxemia 08/08/2017   Unspecified asthma with (acute) exacerbation 08/08/2017   Influenza A with pneumonia 04/29/2017   Xanax use disorder, 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   Drug overdose 04/23/2017   Polysubstance abuse (HCC) 04/23/2017   Anxiety state 03/22/2009   Asthma 01/02/2009   LOW BACK PAIN 01/02/2009   PCP:  [name], MD Pharmacy:   [facility] [place], [place] - [address] [place] [address] Phone: [phone] Fax: [phone]  [id number] [place], [place] - [address] AT [facility] [address] [place] [address] Phone: [phone] Fax: [phone]     Social Determinants of Health (SDOH) Interventions    Readmission Risk Interventions No flowsheet data found.

## 2020-11-25 NOTE — Consult Note (Addendum)
Cardiology Consultation:   Patient ID: Vicki Moore MRN: [id number]; DOB: 12/02/1980  Admit date: 11/19/2020 Date of Consult: 11/25/2020  PCP:  Vicki Moore, Enobong, MD   [facility] Providers Cardiologist:  Vicki NoseKenneth C Hilty, MD   Patient Profile:   Vicki Moore is a 40 y.o. female with a hx of asthma, anxiety, record reports polysubstance abuse (opioids, tobacco, ETOH) chronic pain (seems since her cholecystectomy a year ago), GERD who is being seen 11/25/2020 for the evaluation of Vicki Moore at the request of Vicki Moore.  History of Present Illness:   Vicki Moore mentions that she tends to have anxiety and of late has had a umber of personal and family issues that has really exacerbated her anxiety of late.  Of late: Jan 2022 had COVID  April 2022: admitted with an unintentional opioid OD requiring Narcan and bagged for 8 minutes and eventually regained mental alertness and consciousness, and during this stay had benzo withdrawal No mention of problems with tachycardia in her d/c summary  11/10/20 admitted with c/o acute onset SOB, ongoing RUQ pain since her cholecystectomy a year prior with ongoing issues with diarrhea since then as well.  She was hypoxic and treated for asthma exacerbation, RUQ pain eval was negative Admitted to ongoing Percocet use (though had no active Rx for it) I don't see any mention of tachycardia/SVT issues in her d/c summary Discharged 11/13/20  11/19/20 ER visit with abdominal pain, N/V/D and SOB.  Treated in the ER, give duonebs and steroids, ECG reviewed and is similar to prior, concern for ventricular pre-excitation. HR is stable, CT imaging reviewed and is non-acute. Ultimately discharged from the ER.  The patient requested percocet for discharge though not Rx and given bentyl/antiemetics. Rec surgical eval for her hernia  11/19/20 returned to the ER via EMS with acute worsening of SOB, EMS who found the patient with reportedly silent lung sounds and sats  on RA 85%.  She was given 2 albuterol nebs and a DuoNeb prior to arrival.  She arrived to the ER on BiPAP.  They also cardioverted the patient out of what was felt to have been an SVT in route when she had a heart rate in the 170s.  No hypotension.   EMS record reviewed EKG 7:47 is ST 130 8:12pm artifact rates 140's-150 Record indicates DCCV at 8:21 8:27 ST 133 BP stable throughout vital documentation  LABS K+ 3.2 > 3.0 > 3.7 > 3.5 Mag 1.6 today BUN/Creat 8/0.73 HS Trop 7 WBC 9.5 H/H 9.8/31.7 Plts 263  She reports to Vicki Moore that she has at times through her life felt her HR fast though has attributed it to her asthma, this is the first time her heart has gone so fast and made her feel so awful No reports of syncope  She reports that she had similar rates last night and was very anxiety provoking   Past Medical History:  Diagnosis Date   Anxiety    Asthma    Asthma    Depression    GERD (gastroesophageal reflux disease)     Past Surgical History:  Procedure Laterality Date   CHOLECYSTECTOMY N/A 12/08/2019   Procedure: LAPAROSCOPIC CHOLECYSTECTOMY;  Surgeon: Vicki Moore, Mark, MD;  Location: [facility];  Service: General;  Laterality: N/A;     Home Medications:  Prior to Admission medications   Medication Sig Start Date End Date Taking? Authorizing Provider  acetaminophen (TYLENOL) 325 MG tablet Take 2 tablets (650 mg total) by mouth every 6 (six)  hours as needed. 11/19/20   Vicki Leiter, DO  albuterol (PROVENTIL HFA;VENTOLIN HFA) 108 (90 BASE) MCG/ACT inhaler Inhale 2 puffs into the lungs every 6 (six) hours as needed for wheezing or shortness of breath. For shortness of breath    [provider]  alprazolam Prudy Feeler) 2 MG tablet Take 2 mg by mouth in the morning, at noon, and at bedtime.    [provider]  Calcium Carbonate Antacid (TUMS PO) Take 1-2 tablets by mouth every hour as needed (acid reflux/heartburn).    [provider]  dicyclomine  (BENTYL) 20 MG tablet Take 1 tablet (20 mg total) by mouth 2 (two) times daily. 11/19/20   Vicki Leiter, DO  fluticasone-salmeterol (ADVAIR DISKUS) 500-50 MCG/ACT AEPB Inhale 1 puff into the lungs 2 (two) times daily. 11/12/20   Vicki Stain, DO  ipratropium-albuterol (DUONEB) 0.5-2.5 (3) MG/3ML SOLN Take 3 mLs by nebulization 3 (three) times daily as needed for wheezing or shortness of breath. 10/11/19   [provider]  nicotine (NICODERM CQ - DOSED IN MG/24 HOURS) 14 mg/24hr patch Place 1 patch (14 mg total) onto the skin daily. Patient not taking: No sig reported 08/07/20   Moore, Vicki Chesterfield, MD  ondansetron (ZOFRAN ODT) 4 MG disintegrating tablet Take 1 tablet (4 mg total) by mouth every 8 (eight) hours as needed for nausea or vomiting. 11/19/20   Vicki Leiter, DO  pantoprazole (PROTONIX) 40 MG tablet Take 1 tablet (40 mg total) by mouth 2 (two) times daily before a meal. 11/15/19   Moore, Vicki Halt, MD  tetrahydrozoline-zinc (VISINE-AC) 0.05-0.25 % ophthalmic solution Place 1 drop into both eyes 3 (three) times daily as needed (dry eyes).    [provider]  valACYclovir (VALTREX) 1000 MG tablet Take 1,000 mg by mouth 2 (two) times daily as needed (breakouts). 02/09/20   [provider]    Inpatient Medications: Scheduled Meds:  budesonide (PULMICORT) nebulizer solution  0.25 mg Nebulization BID   dicyclomine  20 mg Oral BID   metoCLOPramide (REGLAN) injection  5 mg Intravenous Q8H   metoprolol tartrate  12.5 mg Oral BID   nicotine  14 mg Transdermal Daily   pantoprazole (PROTONIX) IV  40 mg Intravenous Q12H   scopolamine  1 patch Transdermal Q72H   Continuous Infusions:  sodium chloride 100 mL/hr at 11/24/20 1637   promethazine (PHENERGAN) injection (IM or IVPB) 12.5 mg (11/23/20 0804)   PRN Meds: acetaminophen, alprazolam, calcium carbonate, levalbuterol **AND** ipratropium, ondansetron (ZOFRAN) IV, ondansetron, oxyCODONE-acetaminophen, promethazine  (PHENERGAN) injection (IM or IVPB), simethicone, valACYclovir, zolpidem  Allergies:    Allergies  Allergen Reactions   Peanut-Containing Drug Products Hives and Shortness Of Breath   Penicillins Anaphylaxis, Hives, Nausea And Vomiting and Rash          Social History:   Social History   Socioeconomic History   Marital status: Single    Spouse name: Not on file   Number of children: 1   Years of education: Not on file   Highest education level: Not on file  Occupational History   Not on file  Tobacco Use   Smoking status: Some Days    Packs/day: 0.25    Years: 10.00    Pack years: 2.50    Types: Cigarettes   Smokeless tobacco: Never  Vaping Use   Vaping Use: Never used  Substance and Sexual Activity   Alcohol use: Yes    Comment: rare   Drug use: Not Currently   Sexual  activity: Not on file  Other Topics Concern   Not on file  Social History Narrative   Not on file   Social Determinants of Health   Financial Resource Strain: Not on file  Food Insecurity: Not on file  Transportation Needs: Not on file  Physical Activity: Not on file  Stress: Not on file  Social Connections: Not on file  Intimate Partner Violence: Not on file    Family History:   History reviewed. No cardia family history.   ROS:  Please see the history of present illness.  All other ROS reviewed and negative.     Physical Exam/Data:   Vitals:   11/24/20 1751 11/24/20 2024 11/25/20 0617 11/25/20 0809  BP: (!) 95/59 93/60 102/63   Pulse: 97 100 91   Resp: 16 20 18    Temp: 98.3 F (36.8 C) 98.2 F (36.8 C) 98.5 F (36.9 C)   TempSrc:  Oral Oral   SpO2: 99% 96% 94% 96%  Weight:      Height:        Intake/Output Summary (Last 24 hours) at 11/25/2020 0940 Last data filed at 11/25/2020 0740 Gross per 24 hour  Intake 1340 ml  Output --  Net 1340 ml   Last 3 Weights 11/23/2020 11/21/2020 11/21/2020  Weight (lbs) 200 lb 13.4 oz 191 lb 12.8 oz 187 lb 6.3 oz  Weight (kg) 91.1 kg 87 kg  85 kg     Body mass index is 37.95 kg/m.  General:  Well nourished, well developed, in no acute physical distress HEENT: normal Lymph: no adenopathy Neck: no JVD Endocrine:  No thryomegaly Vascular: No carotid bruits  Cardiac:  RRR; no murmurs, gallops or rubs Lungs:  CTA b/l, no wheezing, rhonchi or rales  Abd: soft, nontender, no hepatomegaly  Ext: no edema Musculoskeletal:  No deformities, BUE and BLE strength normal and equal Skin: warm and dry  Neuro:  CNs 2-12 intact, no focal abnormalities noted Psych:  Normal affect  though quite anxious and intermittently tearful  EKG:  The EKG was personally reviewed and demonstrates:    8/9 #1 ST 119bpm, + pre-excitation noted #2 ST 101bpm + delta wave  8/11: SR 87bpm, + delta wave Yesterday SR 94bpm + delta wave Telemetry:  Telemetry was personally reviewed and demonstrates:   SR Yesterday she had tachycardia though warmed up onto the rates 130's-140's and cooled off No SVT noted  Relevant CV Studies:  11/23/20: TTE IMPRESSIONS   1. Left ventricular ejection fraction, by estimation, is 60 to 65%. The  left ventricle has normal function. The left ventricle has no regional  wall motion abnormalities. Left ventricular diastolic parameters were  normal.   2. Right ventricular systolic function is normal. The right ventricular  size is normal.   3. The mitral valve is normal in structure. Trivial mitral valve  regurgitation. No evidence of mitral stenosis.   4. The aortic valve is tricuspid. Aortic valve regurgitation is not  visualized. No aortic stenosis is present.   5. The inferior vena cava is normal in size with greater than 50%  respiratory variability, suggesting right atrial pressure of 3 mmHg.   Laboratory Data:  High Sensitivity Troponin:   Recent Labs  Lab 11/19/20 0936 11/19/20 2125 11/19/20 2249  TROPONINIHS 7 8 7      Chemistry Recent Labs  Lab 11/19/20 2318 11/21/20 0756 11/25/20 0404  NA 140 138 137   K 3.0* 3.7 3.5  CL 106 103 107  CO2 18* 24  17*  GLUCOSE 132* 87 70  BUN 6 7 8   CREATININE 0.86 0.73 0.73  CALCIUM 9.6 9.2 8.4*  GFRNONAA >60 >60 >60  ANIONGAP 16* 11 13    Recent Labs  Lab 11/19/20 0936 11/19/20 2125 11/21/20 0756  PROT 7.0 7.8 6.8  ALBUMIN 3.5 3.9 3.6  AST 81* 85* 54*  ALT 83* 84* 79*  ALKPHOS 120 128* 105  BILITOT 0.8 0.6 0.9   Hematology Recent Labs  Lab 11/19/20 0936 11/19/20 2125 11/19/20 2256 11/21/20 0756  WBC 6.6 11.9*  --  9.5  RBC 3.85* 4.31  --  3.84*  HGB 9.9* 11.0* 11.9* 9.8*  HCT 31.6* 37.3 35.0* 31.7*  MCV 82.1 86.5  --  82.6  MCH 25.7* 25.5*  --  25.5*  MCHC 31.3 29.5*  --  30.9  RDW 19.7* 20.0*  --  20.0*  PLT 257 439*  --  263   BNPNo results for input(s): BNP, PROBNP in the last 168 hours.  DDimer  Recent Labs  Lab 11/19/20 2318  DDIMER <0.27     Radiology/Studies:     Assessment and Plan:   Vicki Moore Telemetry reviewed Yesterday was sinus tach, not an SVT and suspect this was driven by her anxiety   Dr. 2319 has seen and examined the patient We will start Toprol 25mg  daily and plan EP out patient follow up  Treatment of her anxiety will be very important and defer this to medicine team    Risk Assessment/Risk Scores:    For questions or updates, please contact [facility] Please consult [email or web address] for contact info under    Signed, Ladona Ridgel, PA-C  11/25/2020 9:40 AM  EP Attending  Patient seen and examined. 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 She presented with sinus tachycardia. No documented SVT or rapid atrial fib. I have reviewed the treatment options. She will be started on low dose beta blockade and will followup up with me in the office. She is quite anxious and this is contributing to her multiple problems.    Sheilah Pigeon Reginia Battie,MD

## 2020-11-26 ENCOUNTER — Encounter (HOSPITAL_COMMUNITY): Payer: Self-pay | Admitting: Gastroenterology

## 2020-11-26 LAB — BASIC METABOLIC PANEL
Anion gap: 10 (ref 5–15)
BUN: <5 mg/dL — ABNORMAL LOW (ref 6–20)
CO2: 21 mmol/L — ABNORMAL LOW (ref 22–32)
Calcium: 8.7 mg/dL — ABNORMAL LOW (ref 8.9–10.3)
Chloride: 107 mmol/L (ref 98–111)
Creatinine, Ser: 0.67 mg/dL (ref 0.44–1.00)
GFR, Estimated: >60 mL/min (ref >60)
Glucose, Bld: 92 mg/dL (ref 70–99)
Potassium: 3.8 mmol/L (ref 3.5–5.1)
Sodium: 138 mmol/L (ref 135–145)

## 2020-11-26 LAB — CULTURE, BLOOD (ROUTINE X 2)
Culture: NO GROWTH
Special Requests: ADEQUATE

## 2020-11-26 LAB — MAGNESIUM: Magnesium: 1.8 mg/dL (ref 1.7–2.4)

## 2020-11-26 MED ORDER — ENOXAPARIN SODIUM 40 MG/0.4ML IJ SOSY
40.0000 mg | PREFILLED_SYRINGE | INTRAMUSCULAR | Status: DC
  Administered 2020-11-26 – 2020-11-28 (×3): 40 mg via SUBCUTANEOUS
  Filled 2020-11-26 (×3): qty 0.4

## 2020-11-26 MED ORDER — METOCLOPRAMIDE HCL 5 MG/ML IJ SOLN
5.0000 mg | Freq: Two times a day (BID) | INTRAMUSCULAR | Status: DC
  Administered 2020-11-26 – 2020-11-27 (×4): 5 mg via INTRAVENOUS
  Filled 2020-11-26 (×4): qty 2

## 2020-11-26 MED ORDER — SODIUM CHLORIDE 0.9 % IV SOLN: INTRAVENOUS | Status: DC

## 2020-11-26 MED ORDER — RISAQUAD PO CAPS
1.0000 | ORAL_CAPSULE | Freq: Every day | ORAL | Status: DC
  Administered 2020-11-26 – 2020-11-28 (×3): 1 via ORAL
  Filled 2020-11-26 (×3): qty 1

## 2020-11-26 NOTE — Progress Notes (Addendum)
Cardiology service was called with concerns of fast HRs earlier today, telemetry was reviewed by one of our general cardiologists who confirmed ST with no need for intervention or revisit from EP service. I have been asked to come back at patient's requests. Telemetry is reviewed She is in SR 90's-100 currently, she has had some periods of ST and not persistently, no SVTs She did have ST towards 140's this apparently associated with OOB to the bathroom  In review of the chart  IM note today  "That she is not able to tolerate soft diet.  Will downgrade cortrak tube later today.  Was on Reglan but due to diarrhea it was discontinued yesterday.  I spoke with the patient about it and will start on Reglan with Imodium.  Continue high-dose Protonix.  EGD showed esophagitis.. Patient likely has functional abdominal pain as well/irritable bowel syndrome."  She was seen by pulmonary at her request with concerns of having historically had an unidentified R lung infection remotely and requested they do a bronchoscopy They suspect her asthma exacerbation is 2/2 being without her controller meds out patient and plan to follow her out patient, no procedures felt needed at this time.  The patient expresses concerns that with minimal exertion today to the bathroom only her HR shot up towards 140, she is extremely worried about having her HR get fast again at home like it did when she was in the ambulance and has been watching her HR on telemetry closely and very reluctant to go home (when otherwise ready).  I have discussed with the patient (her mother on the phone and her father at bedside) that her sinus tachycardia is felt to be 2/2 her acute GI issues, and anxiety about all of the above, and not a primary issue. Tried to assure her of benign nature of sinus tachycardia. We will follow tomorrow   I will discuss with MD. Francis Dowse, PA-C

## 2020-11-26 NOTE — Progress Notes (Signed)
   11/26/20 1240  Vitals  Temp 99 F (37.2 C)  Temp Source Oral  BP 110/69  MAP (mmHg) 82  BP Location Left Arm  BP Method Automatic  Patient Position (if appropriate) Lying  Pulse Rate (!) 105  Pulse Rate Source Monitor  Resp (!) 22  Level of Consciousness  Level of Consciousness Alert  MEWS COLOR  MEWS Score Color Yellow  Oxygen Therapy  SpO2 97 %  O2 Device Room Air  MEWS Score  MEWS Temp 0  MEWS Systolic 0  MEWS Pulse 1  MEWS RR 1  MEWS LOC 0  MEWS Score 2

## 2020-11-26 NOTE — Plan of Care (Signed)
  Problem: Education: Goal: Knowledge of General Education information will improve Description: Including pain rating scale, medication(s)/side effects and non-pharmacologic comfort measures Outcome: Progressing   Problem: Activity: Goal: Risk for activity intolerance will decrease Outcome: Progressing   Problem: Nutrition: Goal: Adequate nutrition will be maintained Outcome: Progressing   

## 2020-11-26 NOTE — Progress Notes (Signed)
   11/26/20 1207  Provider Notification  Provider Name/Title Dr.Pokhrel, MD  Date Provider Notified 11/26/20  Time Provider Notified 1204  Notification Type Page  Notification Reason Other (Comment) (HR substaining in 140s per central tele monitoring)  Provider response See new orders (obtain EKG contact cardiology)  Date of Provider Response 11/26/20  Time of Provider Response 1207  Rapid Response Notification  Name of Rapid Response RN Notified Myriam Jacobson, RN  Date Rapid Response Notified 11/26/20  Time Rapid Response Notified 1221

## 2020-11-26 NOTE — Progress Notes (Addendum)
PROGRESS NOTE  Vicki Moore ZOX:[id number] DOB: 12/14/1980 DOA: 11/19/2020 PCP: Hoy RegisterNewlin, Enobong, MD   LOS: 6 days   Brief narrative:  Vicki PinesLeanne M Moore is a 40 y.o. female with medical history significant of asthma, depression, anxiety, chronic abdominal pain, opiate abuse presented to hospital with wheezing shortness of breath and abdominal pain.  Patient was initially seen in the ED and had felt better so she was discharged home after negative CT scan but then had increasing respiratory distress with pulse ox of 85% on room air so she was brought into the hospital and was started on CPAP.  In the ED, patient was initially put on BiPAP and was admitted to hospital for further evaluation and treatment.  Patient stated that she had ran out of her Advair for a month.  Patient was then admitted to the hospital for further evaluation and treatment.  During hospitalization, patient persisted to have nausea and vomiting so GI was consulted.  EGD was done.  Patient was thought to have gastroparesis/anxiety depression related/irritable bowel syndrome.  Patient did have episodes of tachycardia in the hospital and was noted to have WPW syndrome ,cardiology was consulted.  Cardiology has seen the patient and recommended metoprolol and outpatient follow-up.  She was also seen by pulmonary during hospitalization.  At this time patient is still persist to have weakness poor oral intake vomiting.  Assessment/Plan:  Principal Problem:   Acute asthma exacerbation Active Problems:   Acute respiratory failure with hypoxia (HCC)   Acute hypoxemic respiratory failure (HCC)   Abdominal pain   SVT (supraventricular tachycardia) (HCC)   Lactic acidosis   Non-intractable vomiting   Hiatal hernia   WPW (Wolff-Parkinson-White syndrome)  Nausea, vomiting, persistent abdominal pain.   Patient has had cholecystectomy in the past for abdominal pain.  GI was consulted for her symptoms.  That she is not able to  tolerate soft diet.  Will downgrade to clear diet today.  Was on Reglan but due to diarrhea it was discontinued yesterday.  I spoke with the patient about it and will start on Reglan with Imodium.  Continue high-dose Protonix.  EGD showed esophagitis.. Patient likely has functional abdominal pain as well/irritable bowel syndrome.  We will try to to avoid narcotics if possible.  Patient might benefit from addressing her anxiety depression and psychological issues as outpatient.    Acute hypoxemic respiratory failure secondary to asthma exacerbation.   Was not on Advair for a month.  Seen by pulmonary as well.  Has overall improved at this time.   Multiple episodes of diarrhea after starting Reglan.  We will add Imodium.  Hypokalemia.  We will continue to replenish as necessary.  Hypomagnesemia.  We will replenish.   Supraventricular tachycardia.  Patient was was cardioverted enroute to the hospital.  Had tachycardia during hospitalization with WPW syndrome.  Cardiology has been consulted has been started on metoprolol.  Thyroid function normal.  2D echocardiogram with preserved LV function at 60 to 65%.     Reactive leukocytosis.  Resolved.  Likely reactive.  DVT prophylaxis: enoxaparin (LOVENOX) injection 40 mg Start: 11/26/20 1200 SCDs Start: 11/20/20 0602   Code Status: Full code  Family Communication:  None today.  Status is: Inpatient  Remains inpatient appropriate because:IV treatments appropriate due to intensity of illness or inability to take PO and Inpatient level of care appropriate due to severity of illness  Dispo: The patient is from: Home  Anticipated d/c is to: Home likely in 1 to 2 days, still patient very symptomatic with poor oral intolerance.             Patient currently is not medically stable to d/c.   Difficult to place patient No  Consultants: GI  Procedures: EGD on 11/23/2020  Anti-infectives: Valtrex  Anti-infectives (From admission,  onward)    Start     Dose/Rate Route Frequency Ordered Stop   11/20/20 1400  valACYclovir (VALTREX) tablet 1,000 mg        1,000 mg Oral 2 times daily PRN 11/20/20 1402        Subjective: Today, patient was seen and examined at bedside.  He still feels like she has not been able to tolerate much.  Had a lot of diarrhea yesterday.  Feels fatigued weak and not ready today  Objective: Vitals:   11/26/20 1232 11/26/20 1240  BP: 107/70 110/69  Pulse: (!) 126 (!) 105  Resp: 19 19  Temp: 98.4 F (36.9 C) 99 F (37.2 C)  SpO2: 98% 97%    Intake/Output Summary (Last 24 hours) at 11/26/2020 1447 Last data filed at 11/26/2020 1119 Gross per 24 hour  Intake 580 ml  Output 400 ml  Net 180 ml    Filed Weights   11/21/20 2254 11/23/20 0532 11/25/20 2119  Weight: 87 kg 91.1 kg 91.1 kg   Body mass index is 37.95 kg/m.   Physical Exam:  General: Obese built, not in obvious distress anxious HENT:   No scleral pallor or icterus noted. Oral mucosa is moist.  Chest:   Diminished breath sounds bilaterally.  CVS: S1 &S2 heard. No murmur.  Regular rate and rhythm. Abdomen: Soft, nonreducible hernia, bowel sounds Extremities: No cyanosis, clubbing or edema.  Peripheral pulses are palpable. Psych: Alert, awake and oriented, anxious CNS:  No cranial nerve deficits.  Power equal in all extremities.   Skin: Warm and dry.  No rashes noted.   Data Review: I have personally reviewed the following laboratory data and studies,  CBC: Recent Labs  Lab 11/19/20 2125 11/19/20 2256 11/21/20 0756 11/25/20 0940  WBC 11.9*  --  9.5 7.3  NEUTROABS 9.7*  --   --   --   HGB 11.0* 11.9* 9.8* 9.7*  HCT 37.3 35.0* 31.7* 31.7*  MCV 86.5  --  82.6 82.3  PLT 439*  --  263 336    Basic Metabolic Panel: Recent Labs  Lab 11/19/20 2125 11/19/20 2256 11/19/20 2318 11/21/20 0756 11/25/20 0404 11/26/20 0940  NA 138 140 140 138 137 138  K 3.4* 3.2* 3.0* 3.7 3.5 3.8  CL 102  --  106 103 107 107  CO2  9*  --  18* 24 17* 21*  GLUCOSE 163*  --  132* 87 70 92  BUN 7  --  6 7 8  <5*  CREATININE 1.09*  --  0.86 0.73 0.73 0.67  CALCIUM 10.0  --  9.6 9.2 8.4* 8.7*  MG 1.7  --   --   --  1.6* 1.8  PHOS  --   --   --   --  3.8  --     Liver Function Tests: Recent Labs  Lab 11/19/20 2125 11/21/20 0756  AST 85* 54*  ALT 84* 79*  ALKPHOS 128* 105  BILITOT 0.6 0.9  PROT 7.8 6.8  ALBUMIN 3.9 3.6    Recent Labs  Lab 11/19/20 2125  LIPASE 43    No results for input(s):  AMMONIA in the last 168 hours. Cardiac Enzymes: No results for input(s): CKTOTAL, CKMB, CKMBINDEX, TROPONINI in the last 168 hours. BNP (last 3 results) Recent Labs    04/16/20 1006  BNP 77.5     ProBNP (last 3 results) No results for input(s): PROBNP in the last 8760 hours.  CBG: No results for input(s): GLUCAP in the last 168 hours. Recent Results (from the past 240 hour(s))  Resp Panel by RT-PCR (Flu A&B, Covid) Nasopharyngeal Swab     Status: None   Collection Time: 11/19/20  9:36 AM   Specimen: Nasopharyngeal Swab; Nasopharyngeal(NP) swabs in vial transport medium  Result Value Ref Range Status   SARS Coronavirus 2 by RT PCR NEGATIVE NEGATIVE Final    Comment: (NOTE) SARS-CoV-2 target nucleic acids are NOT DETECTED.  The SARS-CoV-2 RNA is generally detectable in upper respiratory specimens during the acute phase of infection. The lowest concentration of SARS-CoV-2 viral copies this assay can detect is 138 copies/mL. A negative result does not preclude SARS-Cov-2 infection and should not be used as the sole basis for treatment or other patient management decisions. A negative result may occur with  improper specimen collection/handling, submission of specimen other than nasopharyngeal swab, presence of viral mutation(s) within the areas targeted by this assay, and inadequate number of viral copies(<138 copies/mL). A negative result must be combined with clinical observations, patient history, and  epidemiological information. The expected result is Negative.  Fact Sheet for Patients:  BloggerCourse.com  Fact Sheet for Healthcare Providers:  SeriousBroker.it  This test is no t yet approved or cleared by the [place] FDA and  has been authorized for detection and/or diagnosis of SARS-CoV-2 by FDA under an Emergency Use Authorization (EUA). This EUA will remain  in effect (meaning this test can be used) for the duration of the COVID-19 declaration under Section 564(b)(1) of the Act, 21 U.S.C.section 360bbb-3(b)(1), unless the authorization is terminated  or revoked sooner.       Influenza A by PCR NEGATIVE NEGATIVE Final   Influenza B by PCR NEGATIVE NEGATIVE Final    Comment: (NOTE) The Xpert Xpress SARS-CoV-2/FLU/RSV plus assay is intended as an aid in the diagnosis of influenza from Nasopharyngeal swab specimens and should not be used as a sole basis for treatment. Nasal washings and aspirates are unacceptable for Xpert Xpress SARS-CoV-2/FLU/RSV testing.  Fact Sheet for Patients: BloggerCourse.com  Fact Sheet for Healthcare Providers: SeriousBroker.it  This test is not yet approved or cleared by the [place] FDA and has been authorized for detection and/or diagnosis of SARS-CoV-2 by FDA under an Emergency Use Authorization (EUA). This EUA will remain in effect (meaning this test can be used) for the duration of the COVID-19 declaration under Section 564(b)(1) of the Act, 21 U.S.C. section 360bbb-3(b)(1), unless the authorization is terminated or revoked.  Performed at [facility], [address], [place], [place] [address]   Culture, blood (routine x 2)     Status: None   Collection Time: 11/20/20  9:07 AM   Specimen: BLOOD  Result Value Ref Range Status   Specimen Description BLOOD SITE NOT SPECIFIED  Final   Special Requests   Final    BOTTLES  DRAWN AEROBIC ONLY Blood Culture results may not be optimal due to an inadequate volume of blood received in culture bottles   Culture   Final    NO GROWTH 5 DAYS Performed at [facility], [address], [place], [place] [address]  Report Status 11/25/2020 FINAL  Final  Culture, blood (Routine X 2) w Reflex to ID Panel     Status: None   Collection Time: 11/21/20  7:57 AM   Specimen: BLOOD  Result Value Ref Range Status   Specimen Description BLOOD RIGHT ANTECUBITAL  Final   Special Requests   Final    BOTTLES DRAWN AEROBIC AND ANAEROBIC Blood Culture adequate volume   Culture   Final    NO GROWTH 5 DAYS Performed at [facility], [address] [place], [place] [address]    Report Status 11/26/2020 FINAL  Final      Studies: No results found.    Joycelyn Das, MD  [facility] 11/26/2020  If 7PM-7AM, please contact night-coverage

## 2020-11-26 NOTE — Progress Notes (Addendum)
Pt was requesting a new Dr with the concern that her needs weren't being addressed regarding her increased hr. During the episode her attending Dr. Tyson Babinski was paged and EKG was ordered, he also spoke with cardiology. She was started on metoprolol 11/25/2020. Cardiology was paged Dr. Jens Som came by to assess the pt. Rapid RN was called came and assessed the pt. Dr. Tyson Babinski spoke with cardiology P.A. Francis Dowse she came back again and spoke with the pt and her father.   Abed Schar, RN

## 2020-11-27 DIAGNOSIS — R000 Tachycardia, unspecified: Secondary | ICD-10-CM

## 2020-11-27 LAB — BASIC METABOLIC PANEL
Anion gap: 11 (ref 5–15)
BUN: 6 mg/dL (ref 6–20)
CO2: 19 mmol/L — ABNORMAL LOW (ref 22–32)
Calcium: 8.5 mg/dL — ABNORMAL LOW (ref 8.9–10.3)
Chloride: 109 mmol/L (ref 98–111)
Creatinine, Ser: 0.64 mg/dL (ref 0.44–1.00)
GFR, Estimated: >60 mL/min (ref >60)
Glucose, Bld: 80 mg/dL (ref 70–99)
Potassium: 3.1 mmol/L — ABNORMAL LOW (ref 3.5–5.1)
Sodium: 139 mmol/L (ref 135–145)

## 2020-11-27 LAB — CBC
HCT: 31.3 % — ABNORMAL LOW (ref 36.0–46.0)
Hemoglobin: 9.2 g/dL — ABNORMAL LOW (ref 12.0–15.0)
MCH: 24.5 pg — ABNORMAL LOW (ref 26.0–34.0)
MCHC: 29.4 g/dL — ABNORMAL LOW (ref 30.0–36.0)
MCV: 83.2 fL (ref 80.0–100.0)
Platelets: 331 10*3/uL (ref 150–400)
RBC: 3.76 MIL/uL — ABNORMAL LOW (ref 3.87–5.11)
RDW: 19.6 % — ABNORMAL HIGH (ref 11.5–15.5)
WBC: 5.6 10*3/uL (ref 4.0–10.5)
nRBC: 0 % (ref 0.0–0.2)

## 2020-11-27 MED ORDER — SODIUM CHLORIDE 0.9 % IV SOLN: INTRAVENOUS | Status: AC

## 2020-11-27 MED ORDER — POTASSIUM CHLORIDE CRYS ER 20 MEQ PO TBCR
40.0000 meq | EXTENDED_RELEASE_TABLET | Freq: Once | ORAL | Status: AC
  Administered 2020-11-27: 40 meq via ORAL
  Filled 2020-11-27: qty 2

## 2020-11-27 NOTE — Progress Notes (Signed)
PROGRESS NOTE  EVI MCCOMB JKK:[id number] DOB: [birth year] DOA: 11/19/2020 PCP: [name], MD   LOS: 7 days   Brief narrative:  Vicki Moore is a 40 y.o. female with medical history significant of asthma, depression, anxiety, chronic abdominal pain, opiate abuse presented to hospital with wheezing shortness of breath and abdominal pain.  Patient was initially seen in the ED and had felt better so she was discharged home after negative CT scan but then had increasing respiratory distress with pulse ox of 85% on room air so she was brought into the hospital and was started on CPAP.  In the ED, patient was initially put on BiPAP and was admitted to hospital for further evaluation and treatment.  Patient stated that she had ran out of her Advair for a month.  Patient was then admitted to the hospital for further evaluation and treatment.  During hospitalization, patient persisted to have nausea and vomiting so GI was consulted.  EGD was done.  Patient was thought to have gastroparesis/anxiety depression related/irritable bowel syndrome but also had esophagitis.  Protonix twice daily was recommended..  Patient did have episodes of tachycardia in the hospital and was noted to have WPW-like picture in the EKG.,cardiology was consulted.  Cardiology has seen the patient and recommended metoprolol and outpatient follow-up.  She was also seen by pulmonary during hospitalization.  At this time patient is still persist to have weakness, postural tachycardia with poor oral intake episodes of vomiting.  Assessment/Plan:  Principal Problem:   Acute asthma exacerbation Active Problems:   Acute respiratory failure with hypoxia (HCC)   Acute hypoxemic respiratory failure (HCC)   Abdominal pain   SVT (supraventricular tachycardia) (HCC)   Lactic acidosis   Non-intractable vomiting   Hiatal hernia   WPW (Wolff-Parkinson-White syndrome)  Nausea, vomiting, abdominal pain.   GI was consulted.   Patient does have esophagitis.  On PPI twice daily.  Symptomatic treatment underway.  Patient likely has underlying anxiety depression reinitiated with somatic symptoms.  On Reglan and Imodium as well.  Diet as tolerated.  Acute hypoxemic respiratory failure secondary to asthma exacerbation.   Well.  Was thought to be secondary to lack of Advair for a month.  Seen by pulmonary as well.  Breathing status has stabilized.  Hypokalemia.  We will continue to replenish orally.  Hypomagnesemia.  Replenished.  Magnesium 1.8   Supraventricular tachycardia.  Patient was was cardioverted enroute to the hospital.  Had tachycardia during hospitalization with WPW EKG findings.  Cardiology was consulted has been started on metoprolol.  Thyroid function normal.  2D echocardiogram with preserved LV function at 60 to 65%.  Cardiology has followed the patient during hospitalization.  Currently having postural tachycardia.  We will continue with IV fluid hydration for now.   Reactive leukocytosis.  Resolved.  Likely reactive.  DVT prophylaxis: enoxaparin (LOVENOX) injection 40 mg Start: 11/26/20 1200 SCDs Start: 11/20/20 0602   Code Status: Full code  Family Communication:  Spoke with the patient's mother on the phone in detail today.  Status is: Inpatient  Remains inpatient appropriate because:IV treatments appropriate due to intensity of illness or inability to take PO and Inpatient level of care appropriate due to severity of illness  Dispo: The patient is from: Home              Anticipated d/c is to: Home likely in 1 to 2 days.  Patient with postural tachycardia.  Continue with IV hydration.  Patient currently is not medically stable to d/c.   Difficult to place patient No  Consultants: GI  Procedures: EGD on 11/23/2020  Anti-infectives: Valtrex  Anti-infectives (From admission, onward)    Start     Dose/Rate Route Frequency Ordered Stop   11/20/20 1400  valACYclovir (VALTREX) tablet  1,000 mg        1,000 mg Oral 2 times daily PRN 11/20/20 1402        Subjective: Today, patient was seen and examined at bedside.  Still feels fatigued and dizzy with episodes of tachycardia especially on standing up. Objective: Vitals:   11/27/20 0841 11/27/20 0921  BP: (!) 107/55   Pulse: (!) 115 (!) 116  Resp: 16 20  Temp: 98.4 F (36.9 C)   SpO2: 96%     Intake/Output Summary (Last 24 hours) at 11/27/2020 1452 Last data filed at 11/27/2020 1100 Gross per 24 hour  Intake 2563.89 ml  Output 1 ml  Net 2562.89 ml    Filed Weights   11/21/20 2254 11/23/20 0532 11/25/20 2119  Weight: 87 kg 91.1 kg 91.1 kg   Body mass index is 37.95 kg/m.   Physical Exam:  General: Obese built, not in obvious distress HENT:   No scleral pallor or icterus noted. Oral mucosa is moist.  Chest:   Diminished breath sounds bilaterally. No crackles or wheezes.  CVS: S1 &S2 heard. No murmur.  Regular rate and rhythm. Abdomen: Soft, bowel sounds present. Extremities: No cyanosis, clubbing or edema.  Peripheral pulses are palpable. Psych: Alert, awake and oriented, anxious, CNS:  No cranial nerve deficits.  Power equal in all extremities.   Skin: Warm and dry.  No rashes noted.   Data Review: I have personally reviewed the following laboratory data and studies,  CBC: Recent Labs  Lab 11/21/20 0756 11/25/20 0940 11/27/20 0335  WBC 9.5 7.3 5.6  HGB 9.8* 9.7* 9.2*  HCT 31.7* 31.7* 31.3*  MCV 82.6 82.3 83.2  PLT 263 336 331    Basic Metabolic Panel: Recent Labs  Lab 11/21/20 0756 11/25/20 0404 11/26/20 0940 11/27/20 0335  NA 138 137 138 139  K 3.7 3.5 3.8 3.1*  CL 103 107 107 109  CO2 24 17* 21* 19*  GLUCOSE 87 70 92 80  BUN 7 8 <5* 6  CREATININE 0.73 0.73 0.67 0.64  CALCIUM 9.2 8.4* 8.7* 8.5*  MG  --  1.6* 1.8  --   PHOS  --  3.8  --   --     Liver Function Tests: Recent Labs  Lab 11/21/20 0756  AST 54*  ALT 79*  ALKPHOS 105  BILITOT 0.9  PROT 6.8  ALBUMIN 3.6     No results for input(s): LIPASE, AMYLASE in the last 168 hours.  No results for input(s): AMMONIA in the last 168 hours. Cardiac Enzymes: No results for input(s): CKTOTAL, CKMB, CKMBINDEX, TROPONINI in the last 168 hours. BNP (last 3 results) Recent Labs    04/16/20 1006  BNP 77.5     ProBNP (last 3 results) No results for input(s): PROBNP in the last 8760 hours.  CBG: No results for input(s): GLUCAP in the last 168 hours. Recent Results (from the past 240 hour(s))  Resp Panel by RT-PCR (Flu A&B, Covid) Nasopharyngeal Swab     Status: None   Collection Time: 11/19/20  9:36 AM   Specimen: Nasopharyngeal Swab; Nasopharyngeal(NP) swabs in vial transport medium  Result Value Ref Range Status   SARS Coronavirus 2 by RT PCR  NEGATIVE NEGATIVE Final    Comment: (NOTE) SARS-CoV-2 target nucleic acids are NOT DETECTED.  The SARS-CoV-2 RNA is generally detectable in upper respiratory specimens during the acute phase of infection. The lowest concentration of SARS-CoV-2 viral copies this assay can detect is 138 copies/mL. A negative result does not preclude SARS-Cov-2 infection and should not be used as the sole basis for treatment or other patient management decisions. A negative result may occur with  improper specimen collection/handling, submission of specimen other than nasopharyngeal swab, presence of viral mutation(s) within the areas targeted by this assay, and inadequate number of viral copies(<138 copies/mL). A negative result must be combined with clinical observations, patient history, and epidemiological information. The expected result is Negative.  Fact Sheet for Patients:  BloggerCourse.comhttps://www.fda.gov/media/152166/download  Fact Sheet for Healthcare Providers:  SeriousBroker.ithttps://www.fda.gov/media/152162/download  This test is no t yet approved or cleared by the [place] FDA and  has been authorized for detection and/or diagnosis of SARS-CoV-2 by FDA under an Emergency Use  Authorization (EUA). This EUA will remain  in effect (meaning this test can be used) for the duration of the COVID-19 declaration under Section 564(b)(1) of the Act, 21 U.S.C.section 360bbb-3(b)(1), unless the authorization is terminated  or revoked sooner.       Influenza A by PCR NEGATIVE NEGATIVE Final   Influenza B by PCR NEGATIVE NEGATIVE Final    Comment: (NOTE) The Xpert Xpress SARS-CoV-2/FLU/RSV plus assay is intended as an aid in the diagnosis of influenza from Nasopharyngeal swab specimens and should not be used as a sole basis for treatment. Nasal washings and aspirates are unacceptable for Xpert Xpress SARS-CoV-2/FLU/RSV testing.  Fact Sheet for Patients: BloggerCourse.comhttps://www.fda.gov/media/152166/download  Fact Sheet for Healthcare Providers: SeriousBroker.ithttps://www.fda.gov/media/152162/download  This test is not yet approved or cleared by the [place] FDA and has been authorized for detection and/or diagnosis of SARS-CoV-2 by FDA under an Emergency Use Authorization (EUA). This EUA will remain in effect (meaning this test can be used) for the duration of the COVID-19 declaration under Section 564(b)(1) of the Act, 21 U.S.C. section 360bbb-3(b)(1), unless the authorization is terminated or revoked.  Performed at [facility], [address] [place], [place] [address]   Culture, blood (routine x 2)     Status: None   Collection Time: 11/20/20  9:07 AM   Specimen: BLOOD  Result Value Ref Range Status   Specimen Description BLOOD SITE NOT SPECIFIED  Final   Special Requests   Final    BOTTLES DRAWN AEROBIC ONLY Blood Culture results may not be optimal due to an inadequate volume of blood received in culture bottles   Culture   Final    NO GROWTH 5 DAYS Performed at [facility], [address] [place], [place] [address]    Report Status 11/25/2020 FINAL  Final  Culture, blood (Routine X 2) w Reflex to ID Panel     Status: None   Collection Time: 11/21/20  7:57 AM    Specimen: BLOOD  Result Value Ref Range Status   Specimen Description BLOOD RIGHT ANTECUBITAL  Final   Special Requests   Final    BOTTLES DRAWN AEROBIC AND ANAEROBIC Blood Culture adequate volume   Culture   Final    NO GROWTH 5 DAYS Performed at [facility], [address] [place], [place] [address]    Report Status 11/26/2020 FINAL  Final      Studies: No results found.    Joycelyn DasLaxman Eleshia Wooley, MD  [facility] 11/27/2020  If 7PM-7AM, please contact night-coverage

## 2020-11-27 NOTE — Progress Notes (Addendum)
Progress Note  Patient Name: Vicki Moore Date of Encounter: 11/27/2020  [facility] Cardiologist: Chrystie Nose, MD   Subjective   Anxious w many question   Inpatient Medications    Scheduled Meds:  acidophilus  1 capsule Oral Daily   arformoterol  15 mcg Nebulization BID   budesonide (PULMICORT) nebulizer solution  0.25 mg Nebulization BID   dicyclomine  20 mg Oral BID   enoxaparin (LOVENOX) injection  40 mg Subcutaneous Q24H   metoCLOPramide (REGLAN) injection  5 mg Intravenous Q12H   metoprolol succinate  25 mg Oral Daily   nicotine  14 mg Transdermal Daily   pantoprazole (PROTONIX) IV  40 mg Intravenous Q12H   scopolamine  1 patch Transdermal Q72H   Continuous Infusions:  sodium chloride 100 mL/hr at 11/27/20 0853   promethazine (PHENERGAN) injection (IM or IVPB) 12.5 mg (11/26/20 0036)   PRN Meds: acetaminophen, alprazolam, calcium carbonate, levalbuterol **AND** ipratropium, loperamide, ondansetron (ZOFRAN) IV, ondansetron, oxyCODONE-acetaminophen, promethazine (PHENERGAN) injection (IM or IVPB), simethicone, valACYclovir, zolpidem   Vital Signs    Vitals:   11/26/20 2105 11/27/20 0528 11/27/20 0841 11/27/20 0921  BP:  (!) 108/58 (!) 107/55   Pulse:  97 (!) 115 (!) 116  Resp:  18 16 20   Temp:  98.7 F (37.1 C) 98.4 F (36.9 C)   TempSrc:  Oral Oral   SpO2: 99% 94% 96%   Weight:      Height:        Intake/Output Summary (Last 24 hours) at 11/27/2020 0933 Last data filed at 11/27/2020 0905 Gross per 24 hour  Intake 2703.89 ml  Output 2 ml  Net 2701.89 ml   Last 3 Weights 11/25/2020 11/23/2020 11/21/2020  Weight (lbs) 200 lb 13.5 oz 200 lb 13.4 oz 191 lb 12.8 oz  Weight (kg) 91.103 kg 91.1 kg 87 kg      Telemetry    Sinus  - Personally Reviewed  ECG    No new EKGs - Personally Reviewed  Physical Exam   GEN: No acute distress.   Cardiac:  RRR,    MS: No edema; No deformity. Neuro:  Nonfocal  Psych: Normal affect tearful   Labs     High Sensitivity Troponin:   Recent Labs  Lab 11/19/20 0936 11/19/20 2125 11/19/20 2249  TROPONINIHS 7 8 7       Chemistry Recent Labs  Lab 11/21/20 0756 11/25/20 0404 11/26/20 0940 11/27/20 0335  NA 138 137 138 139  K 3.7 3.5 3.8 3.1*  CL 103 107 107 109  CO2 24 17* 21* 19*  GLUCOSE 87 70 92 80  BUN 7 8 <5* 6  CREATININE 0.73 0.73 0.67 0.64  CALCIUM 9.2 8.4* 8.7* 8.5*  PROT 6.8  --   --   --   ALBUMIN 3.6  --   --   --   AST 54*  --   --   --   ALT 79*  --   --   --   ALKPHOS 105  --   --   --   BILITOT 0.9  --   --   --   GFRNONAA >60 >60 >60 >60  ANIONGAP 11 13 10 11      Hematology Recent Labs  Lab 11/21/20 0756 11/25/20 0940 11/27/20 0335  WBC 9.5 7.3 5.6  RBC 3.84* 3.85* 3.76*  HGB 9.8* 9.7* 9.2*  HCT 31.7* 31.7* 31.3*  MCV 82.6 82.3 83.2  MCH 25.5* 25.2* 24.5*  MCHC 30.9 30.6  29.4*  RDW 20.0* 19.9* 19.6*  PLT 263 336 331    BNPNo results for input(s): BNP, PROBNP in the last 168 hours.   DDimer No results for input(s): DDIMER in the last 168 hours.   Radiology    No results found.  Cardiac Studies   11/23/20 TTE IMPRESSIONS   1. Left ventricular ejection fraction, by estimation, is 60 to 65%. The  left ventricle has normal function. The left ventricle has no regional  wall motion abnormalities. Left ventricular diastolic parameters were  normal.   2. Right ventricular systolic function is normal. The right ventricular  size is normal.   3. The mitral valve is normal in structure. Trivial mitral valve  regurgitation. No evidence of mitral stenosis.   4. The aortic valve is tricuspid. Aortic valve regurgitation is not  visualized. No aortic stenosis is present.   5. The inferior vena cava is normal in size with greater than 50%  respiratory variability, suggesting right atrial pressure of 3 mmHg.   Patient Profile     40 y.o. female  with a hx of asthma, anxiety, record reports polysubstance abuse (opioids, tobacco, ETOH) chronic pain  (seems since her cholecystectomy a year ago), GERD.  11/19/20 ER visit with abdominal pain, N/V/D and SOB.  Treated in the ER, give duonebs and steroids, ECG reviewed and is similar to prior, concern for ventricular pre-excitation. HR is stable, CT imaging reviewed and is non-acute. Ultimately discharged from the ER.  The patient requested percocet for discharge though not Rx and given bentyl/antiemetics. Rec surgical eval for her hernia   11/19/20 returned to the ER via EMS with acute worsening of SOB, EMS who found the patient with reportedly silent lung sounds and sats on RA 85%.  She was given 2 albuterol nebs and a DuoNeb prior to arrival.  She arrived to the ER on BiPAP.  They also cardioverted the patient out of what was felt to have been an SVT in route when she had a heart rate in the 170s.  No hypotension.    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  All were most consistent with sinus rhythm.  Could not discern any P waves on the ECG prior to the shock in the ambulance; however, the RR intervals were identical.  Post shock most consistent with sinus rhythm.  She has some degree of orthostasis.  She has significant amount of anxiety.  Her asthma has been an issue as has her bowels.  I have stressed to her that we think her heart is apart from the ventricular preexcitation.  Furthermore, I am not convinced that there are arrhythmias associated with her preexcitation but she will follow-up with Dr. Ladona Ridgel regarding treatment strategies for that.  She has some degree of orthostasis.  Would encourage fluid repletion.  I am not sure that beta-blockers will help very much because I think the sinus tachycardia is largely secondary.  I told her we would continue the low-dose in the hopes that it might take a little bit of the edge off of it for her.  She has significant postural tachycardia today.  In the context of her acute illnesses it cannot be identified as POTS.  I have strongly encouraged her to continue to seek relief of her anxiety which has been so disruptive to her life.  Will sign off  call for further questions

## 2020-11-28 MED ORDER — PROMETHAZINE HCL 12.5 MG PO TABS: 12.5000 mg | ORAL_TABLET | Freq: Four times a day (QID) | ORAL | 0 refills | Status: AC | PRN

## 2020-11-28 MED ORDER — METOCLOPRAMIDE HCL 5 MG PO TABS
5.0000 mg | ORAL_TABLET | Freq: Two times a day (BID) | ORAL | Status: DC
  Administered 2020-11-28: 5 mg via ORAL
  Filled 2020-11-28: qty 1

## 2020-11-28 MED ORDER — POTASSIUM CHLORIDE CRYS ER 20 MEQ PO TBCR
40.0000 meq | EXTENDED_RELEASE_TABLET | Freq: Once | ORAL | Status: AC
  Administered 2020-11-28: 40 meq via ORAL
  Filled 2020-11-28: qty 2

## 2020-11-28 MED ORDER — LEVALBUTEROL TARTRATE 45 MCG/ACT IN AERO: 2.0000 | INHALATION_SPRAY | RESPIRATORY_TRACT | 2 refills | Status: AC | PRN

## 2020-11-28 MED ORDER — RISAQUAD PO CAPS: 1.0000 | ORAL_CAPSULE | Freq: Every day | ORAL | 0 refills | Status: AC

## 2020-11-28 MED ORDER — METOPROLOL SUCCINATE ER 25 MG PO TB24: 25.0000 mg | ORAL_TABLET | Freq: Every evening | ORAL | 2 refills | Status: DC

## 2020-11-28 MED ORDER — PANTOPRAZOLE SODIUM 40 MG PO TBEC
40.0000 mg | DELAYED_RELEASE_TABLET | Freq: Two times a day (BID) | ORAL | Status: DC
  Administered 2020-11-28: 40 mg via ORAL
  Filled 2020-11-28: qty 1

## 2020-11-28 MED ORDER — SODIUM CHLORIDE 0.9 % IV BOLUS
500.0000 mL | Freq: Once | INTRAVENOUS | Status: AC
  Administered 2020-11-28: 500 mL via INTRAVENOUS

## 2020-11-28 MED ORDER — ONDANSETRON 4 MG PO TBDP: 4.0000 mg | ORAL_TABLET | Freq: Three times a day (TID) | ORAL | 0 refills | Status: AC | PRN

## 2020-11-28 MED ORDER — FLUTICASONE-SALMETEROL 500-50 MCG/ACT IN AEPB: 1.0000 | INHALATION_SPRAY | Freq: Two times a day (BID) | RESPIRATORY_TRACT | 2 refills | Status: AC

## 2020-11-28 MED ORDER — POTASSIUM CHLORIDE CRYS ER 20 MEQ PO TBCR: 20.0000 meq | EXTENDED_RELEASE_TABLET | Freq: Every day | ORAL | 0 refills | Status: AC

## 2020-11-28 MED ORDER — ALPRAZOLAM 2 MG PO TABS: 2.0000 mg | ORAL_TABLET | Freq: Three times a day (TID) | ORAL | 0 refills | Status: AC | PRN

## 2020-11-28 NOTE — Progress Notes (Signed)
Spoke with patient at bedside as she is scheduled for discharge today. Patient has concerns about going home with elevated Heart rate. Concerned also about her not being safe if something happens , no one will be there to assist. CM reassured her that she will not have been discharged if she was not medically ready. Reminded her about her anxiety and if she controls it, her heart rate will not elevate. Advised her to speak with her PCP about her Xanax medication. Patient had her mom on face time while having this conversation. Mom requested for patient to have life alert for safety at home. Information form Life alert official web site given to patient.to follow up. Patient and mom endorsed appreciation to the assistance given. No further TOC needs at this time.

## 2020-11-28 NOTE — Discharge Summary (Signed)
Physician Discharge Summary  Vicki Moore EHU:[id number] DOB: January 06, 1981 DOA: 11/19/2020  PCP: [name], MD  Admit date: 11/19/2020 Discharge date: 11/28/2020  Admitted From: Home  Discharge disposition: Home  Recommendations for Outpatient Follow-Up:   Follow up with your primary care provider in one week.  Discuss about pain management referral and better management of your anxiety Check CBC, BMP, magnesium in the next visit Follow-up with cardiology Dr. Ladona Ridgel as outpatient for electrophysiology studies if needed   Discharge Diagnosis:   Principal Problem:   Acute asthma exacerbation Active Problems:   Acute respiratory failure with hypoxia (HCC)   Acute hypoxemic respiratory failure (HCC)   Abdominal pain   SVT (supraventricular tachycardia) (HCC)   Lactic acidosis   Non-intractable vomiting   Hiatal hernia   WPW (Wolff-Parkinson-White syndrome)  Discharge Condition: Improved.  Diet recommendation: Soft bland diet, low-fat.  Wound care: None.  Code status: Full.   History of Present Illness:   Vicki Moore is a 40 y.o. female with medical history significant of asthma, depression, anxiety, chronic abdominal pain, opiate abuse presented to hospital with wheezing, shortness of breath and abdominal pain.  Patient was initially seen in the ED and had felt better so she was discharged home after negative CT scan but then had increasing respiratory distress with pulse ox of 85% on room air so she was brought into the hospital again and was started on CPAP.  In the ED, patient was initially put on BiPAP and was admitted to hospital for further evaluation and treatment.  Patient stated that she had ran out of her Advair for a month.  Patient was then admitted to the hospital for further evaluation and treatment.   During hospitalization, patient persisted to have nausea and vomiting so GI was consulted.  EGD was done.  Patient was thought to have  gastroparesis/anxiety depression related/irritable bowel syndrome but also had esophagitis.  Protonix twice daily was recommended..  Patient did have episodes of tachycardia in the hospital and was noted to have WPW-like picture in the EKG.,cardiology was consulted.  Cardiology has seen the patient and recommended metoprolol and outpatient follow-up.  She was also seen by pulmonary during hospitalization.  At this time patient is still persist to have weakness, postural tachycardia with poor oral intake episodes of vomiting.   Hospital Course:   Following conditions were addressed during hospitalization as listed below,  Nausea, vomiting, abdominal pain.   During hospitalization patient persisted to have nausea vomiting and GI was consulted.  She was noted to have esophagitis and was recommended PPI twice a day.  She was also given Reglan for possible gastroparesis.  Her symptoms were likely related to her underlying anxiety and depression issues and would need better control as outpatient.  Did have a prolonged course in the hospital with p.o. intolerance and gradually was able to tolerate oral diet.   Acute hypoxemic respiratory failure secondary to asthma exacerbation.   Thought to be secondary to lack of Advair.  Patient was advised the need for being compliant with steroids as well.  Breathing issue completely stabilized.  Patient was also seen by pulmonary during hospitalization who recommended outpatient pulmonary follow-up as needed.   Hypokalemia.  Improved with replacement.  Will be given few more days of potassium on discharge.   Hypomagnesemia.  Replenished and improved   Supraventricular tachycardia.  Patient was was cardioverted enroute to the hospital.  Had tachycardia during hospitalization with WPW EKG findings.  She also has sinus  tachycardia mostly related to anxiety.  Cardiology was consulted has been started on metoprolol.  Thyroid function was normal.  2D echocardiogram with  preserved LV function at 60 to 65%.  Cardiology followed the patient during hospitalization and recommend outpatient follow-up.Marland Kitchen.   Reactive leukocytosis.  Resolved.  Likely reactive.  Disposition.  At this time, patient is stable for disposition home with outpatient PCP, cardiology follow-up.  Spoke with the patient's mother in detail regarding the plan for disposition and follow-up.  Medical Consultants:   [facility]  Procedures:    EGD on 11/23/2020 Subjective:   Today, patient seen and examined at bedside.  Feels fatigued and weak but overall significantly improved.  Discharge Exam:   Vitals:   11/28/20 1152 11/28/20 1156  BP: (!) 101/54   Pulse: 90   Resp: 20   Temp: 97.6 F (36.4 C)   SpO2: 100% 100%   Vitals:   11/28/20 0537 11/28/20 0859 11/28/20 1152 11/28/20 1156  BP: (!) 98/47  (!) 101/54   Pulse:   90   Resp: 18  20   Temp: 98 F (36.7 C)  97.6 F (36.4 C)   TempSrc: Oral  Oral   SpO2: 99% 97% 100% 100%  Weight:      Height:        General: Alert awake, not in obvious distress, obese built HENT: pupils equally reacting to light,  No scleral pallor or icterus noted. Oral mucosa is moist.  Chest:  Clear breath sounds.  Diminished breath sounds bilaterally. No crackles or wheezes.  CVS: S1 &S2 heard. No murmur.  Regular rate and rhythm. Abdomen: Soft, nontender, nondistended.  Bowel sounds are heard.   Extremities: No cyanosis, clubbing or edema.  Peripheral pulses are palpable. Psych: Alert, awake and oriented, anxious CNS:  No cranial nerve deficits.  Power equal in all extremities.   Skin: Warm and dry.  No rashes noted.  The results of significant diagnostics from this hospitalization (including imaging, microbiology, ancillary and laboratory) are listed below for reference.     Diagnostic Studies:   CT ABDOMEN PELVIS W CONTRAST  Result Date: 11/19/2020 CLINICAL DATA:  Abdominal pain, nonlocalized periumbilical pain EXAM: CT ABDOMEN  AND PELVIS WITH CONTRAST TECHNIQUE: Multidetector CT imaging of the abdomen and pelvis was performed using the standard protocol following bolus administration of intravenous contrast. CONTRAST:  100mL OMNIPAQUE IOHEXOL 300 MG/ML  SOLN COMPARISON:  CT abdomen/pelvis 04/23/2020 FINDINGS: Lower chest: The lung bases are clear. The imaged heart is unremarkable. Hepatobiliary: The liver is diffusely hypoattenuating consistent with fatty infiltration, similar to priors. The gallbladder is surgically absent. There is no biliary ductal dilatation. Pancreas: Normal. Spleen: Normal. Adrenals/Urinary Tract: The adrenals are unremarkable. A hypodense lesion in the left mid kidney present since 2011 likely reflects a cyst. There are no other lesions. There are no calculi. There is no hydronephrosis or hydroureter. The bladder is unremarkable. Stomach/Bowel: There is a small hiatal hernia, unchanged. The stomach is otherwise unremarkable. There is no evidence of bowel obstruction. There is no abnormal bowel wall thickening or inflammatory change. The appendix is not definitively identified, but there is no pericecal inflammatory change. Vascular/Lymphatic: The abdominal aorta is nonaneurysmal. The main portal and splenic veins are patent. There is no abdominal or pelvic lymphadenopathy. Reproductive: The uterus and adnexa are unremarkable. Other: There is a fat containing umbilical hernia, unchanged. There is no ascites or free air. Musculoskeletal: There is no acute osseous abnormality. IMPRESSION: 1. No acute findings in the abdomen  or pelvis to explain the patient's symptoms. 2. Fatty infiltration of the liver, unchanged. 3. Small hiatal hernia, unchanged. Electronically Signed   By: Lesia Hausen MD   On: 11/19/2020 12:17   DG Chest Portable 1 View  Result Date: 11/19/2020 CLINICAL DATA:  Shortness of breath EXAM: PORTABLE CHEST 1 VIEW COMPARISON:  Film from earlier in the same day. FINDINGS: Cardiac shadow is stable. The  lungs are clear bilaterally. No focal infiltrate or effusion is noted. No bony abnormality is seen. IMPRESSION: No active disease. Electronically Signed   By: Alcide Clever M.D.   On: 11/19/2020 21:08   DG Chest Portable 1 View  Result Date: 11/19/2020 CLINICAL DATA:  Wheezing, asthma, dyspnea. EXAM: PORTABLE CHEST 1 VIEW COMPARISON:  11/10/2020 FINDINGS: The cardiomediastinal silhouette is unchanged with normal heart size. The lungs are mildly hypoinflated with mild accentuation of the interstitial markings. No overt pulmonary edema, confluent airspace opacity, sizeable pleural effusion, or pneumothorax is identified. No acute osseous abnormality is seen. IMPRESSION: No active disease. Electronically Signed   By: Sebastian Ache M.D.   On: 11/19/2020 11:07     Labs:   Basic Metabolic Panel: Recent Labs  Lab 11/25/20 0404 11/26/20 0940 11/27/20 0335  NA 137 138 139  K 3.5 3.8 3.1*  CL 107 107 109  CO2 17* 21* 19*  GLUCOSE 70 92 80  BUN 8 <5* 6  CREATININE 0.73 0.67 0.64  CALCIUM 8.4* 8.7* 8.5*  MG 1.6* 1.8  --   PHOS 3.8  --   --    GFR Estimated Creatinine Clearance: 97 mL/min (by C-G formula based on SCr of 0.64 mg/dL). Liver Function Tests: No results for input(s): AST, ALT, ALKPHOS, BILITOT, PROT, ALBUMIN in the last 168 hours. No results for input(s): LIPASE, AMYLASE in the last 168 hours. No results for input(s): AMMONIA in the last 168 hours. Coagulation profile No results for input(s): INR, PROTIME in the last 168 hours.  CBC: Recent Labs  Lab 11/25/20 0940 11/27/20 0335  WBC 7.3 5.6  HGB 9.7* 9.2*  HCT 31.7* 31.3*  MCV 82.3 83.2  PLT 336 331   Cardiac Enzymes: No results for input(s): CKTOTAL, CKMB, CKMBINDEX, TROPONINI in the last 168 hours. BNP: Invalid input(s): POCBNP CBG: No results for input(s): GLUCAP in the last 168 hours. D-Dimer No results for input(s): DDIMER in the last 72 hours. Hgb A1c No results for input(s): HGBA1C in the last 72  hours. Lipid Profile No results for input(s): CHOL, HDL, LDLCALC, TRIG, CHOLHDL, LDLDIRECT in the last 72 hours. Thyroid function studies No results for input(s): TSH, T4TOTAL, T3FREE, THYROIDAB in the last 72 hours.  Invalid input(s): FREET3 Anemia work up No results for input(s): VITAMINB12, FOLATE, FERRITIN, TIBC, IRON, RETICCTPCT in the last 72 hours. Microbiology Recent Results (from the past 240 hour(s))  Resp Panel by RT-PCR (Flu A&B, Covid) Nasopharyngeal Swab     Status: None   Collection Time: 11/19/20  9:36 AM   Specimen: Nasopharyngeal Swab; Nasopharyngeal(NP) swabs in vial transport medium  Result Value Ref Range Status   SARS Coronavirus 2 by RT PCR NEGATIVE NEGATIVE Final    Comment: (NOTE) SARS-CoV-2 target nucleic acids are NOT DETECTED.  The SARS-CoV-2 RNA is generally detectable in upper respiratory specimens during the acute phase of infection. The lowest concentration of SARS-CoV-2 viral copies this assay can detect is 138 copies/mL. A negative result does not preclude SARS-Cov-2 infection and should not be used as the sole basis for treatment or other  patient management decisions. A negative result may occur with  improper specimen collection/handling, submission of specimen other than nasopharyngeal swab, presence of viral mutation(s) within the areas targeted by this assay, and inadequate number of viral copies(<138 copies/mL). A negative result must be combined with clinical observations, patient history, and epidemiological information. The expected result is Negative.  Fact Sheet for Patients:  BloggerCourse.com  Fact Sheet for Healthcare Providers:  SeriousBroker.it  This test is no t yet approved or cleared by the [place] FDA and  has been authorized for detection and/or diagnosis of SARS-CoV-2 by FDA under an Emergency Use Authorization (EUA). This EUA will remain  in effect (meaning this  test can be used) for the duration of the COVID-19 declaration under Section 564(b)(1) of the Act, 21 U.S.C.section 360bbb-3(b)(1), unless the authorization is terminated  or revoked sooner.       Influenza A by PCR NEGATIVE NEGATIVE Final   Influenza B by PCR NEGATIVE NEGATIVE Final    Comment: (NOTE) The Xpert Xpress SARS-CoV-2/FLU/RSV plus assay is intended as an aid in the diagnosis of influenza from Nasopharyngeal swab specimens and should not be used as a sole basis for treatment. Nasal washings and aspirates are unacceptable for Xpert Xpress SARS-CoV-2/FLU/RSV testing.  Fact Sheet for Patients: BloggerCourse.com  Fact Sheet for Healthcare Providers: SeriousBroker.it  This test is not yet approved or cleared by the [place] FDA and has been authorized for detection and/or diagnosis of SARS-CoV-2 by FDA under an Emergency Use Authorization (EUA). This EUA will remain in effect (meaning this test can be used) for the duration of the COVID-19 declaration under Section 564(b)(1) of the Act, 21 U.S.C. section 360bbb-3(b)(1), unless the authorization is terminated or revoked.  Performed at [facility], [address], [place], [place] [address]   Culture, blood (routine x 2)     Status: None   Collection Time: 11/20/20  9:07 AM   Specimen: BLOOD  Result Value Ref Range Status   Specimen Description BLOOD SITE NOT SPECIFIED  Final   Special Requests   Final    BOTTLES DRAWN AEROBIC ONLY Blood Culture results may not be optimal due to an inadequate volume of blood received in culture bottles   Culture   Final    NO GROWTH 5 DAYS Performed at [facility], [address], [place], [place] [address]    Report Status 11/25/2020 FINAL  Final  Culture, blood (Routine X 2) w Reflex to ID Panel     Status: None   Collection Time: 11/21/20  7:57 AM   Specimen: BLOOD  Result Value Ref Range Status   Specimen  Description BLOOD RIGHT ANTECUBITAL  Final   Special Requests   Final    BOTTLES DRAWN AEROBIC AND ANAEROBIC Blood Culture adequate volume   Culture   Final    NO GROWTH 5 DAYS Performed at [facility], [address], [place], [place] [address]    Report Status 11/26/2020 FINAL  Final     Discharge Instructions:   Discharge Instructions     Diet - low sodium heart healthy   Complete by: As directed    Discharge instructions   Complete by: As directed    Follow-up with your primary Care physician in 1 week, follow-up with Dr. Ladona Ridgel cardiology as outpatient.  Please discuss with your primary care physician about referral to pain management and management of anxiety.  Stay on soft bland diet.  Take your time to  change position especially from lying down position.  Keep yourself hydrated.   Discharge instructions   Complete by: As directed    Follow-up with your primary care physician in 1 week.  Check blood work at that time.  Stay on soft bland diet.  Take medications as prescribed.  Follow-up with cardiology (Dr. Ladona Ridgel) as has been his heart problem.  Continue to take inhalers as prescribed.   Increase activity slowly   Complete by: As directed    Increase activity slowly   Complete by: As directed       Allergies as of 11/28/2020       Reactions   Peanut-containing Drug Products Hives, Shortness Of Breath   Penicillins Anaphylaxis, Hives, Nausea And Vomiting, Rash           Medication List     STOP taking these medications    albuterol 108 (90 Base) MCG/ACT inhaler Commonly known as: VENTOLIN HFA       TAKE these medications    acetaminophen 325 MG tablet Commonly known as: Tylenol Take 2 tablets (650 mg total) by mouth every 6 (six) hours as needed.   acidophilus Caps capsule Take 1 capsule by mouth daily.   alprazolam 2 MG tablet Commonly known as: XANAX Take 1 tablet (2 mg total) by mouth 3 (three) times daily as needed for up to 10 days for  sleep or anxiety. What changed:  when to take this reasons to take this   dicyclomine 20 MG tablet Commonly known as: BENTYL Take 1 tablet (20 mg total) by mouth 2 (two) times daily.   fluticasone-salmeterol 500-50 MCG/ACT Aepb Commonly known as: Advair Diskus Inhale 1 puff into the lungs 2 (two) times daily.   ipratropium-albuterol 0.5-2.5 (3) MG/3ML Soln Commonly known as: DUONEB Take 3 mLs by nebulization 3 (three) times daily as needed for wheezing or shortness of breath.   levalbuterol 45 MCG/ACT inhaler Commonly known as: XOPENEX HFA Inhale 2 puffs into the lungs every 4 (four) hours as needed for wheezing.   metoprolol succinate 25 MG 24 hr tablet Commonly known as: TOPROL-XL Take 1 tablet (25 mg total) by mouth at bedtime.   nicotine 14 mg/24hr patch Commonly known as: NICODERM CQ - dosed in mg/24 hours Place 1 patch (14 mg total) onto the skin daily.   ondansetron 4 MG disintegrating tablet Commonly known as: Zofran ODT Take 1 tablet (4 mg total) by mouth every 8 (eight) hours as needed for up to 10 days for nausea or vomiting.   pantoprazole 40 MG tablet Commonly known as: PROTONIX Take 1 tablet (40 mg total) by mouth 2 (two) times daily before a meal.   potassium chloride SA 20 MEQ tablet Commonly known as: KLOR-CON Take 1 tablet (20 mEq total) by mouth daily for 10 days.   promethazine 12.5 MG tablet Commonly known as: PHENERGAN Take 1 tablet (12.5 mg total) by mouth every 6 (six) hours as needed for nausea or vomiting.   tetrahydrozoline-zinc 0.05-0.25 % ophthalmic solution Commonly known as: VISINE-AC Place 1 drop into both eyes 3 (three) times daily as needed (dry eyes).   TUMS PO Take 1-2 tablets by mouth every hour as needed (acid reflux/heartburn).   valACYclovir 1000 MG tablet Commonly known as: VALTREX Take 1,000 mg by mouth 2 (two) times daily as needed (breakouts).        Follow-up Information     [name], MD Follow up.    Specialty: Family Medicine Contact information: [address]  [place] [address] [phone]         Graciella Freer, PA-C Follow up.   Specialty: Physician Assistant Why: 12/17/20 @ 9:20AM (for Dr. Ladona Ridgel, cardiology) Contact information: [address] [place] [address] [phone]         [facility] Follow up.   Why: Please call to schaedule appointment for outpatient counseling. Contact information: [address]  [address] [place] [address] [phone]         Roselie Awkward, MD. Schedule an appointment as soon as possible for a visit in 1 week(s).   Specialty: Anesthesiology Why: follow up for pain management. Contact information: [address] [place] [address] [phone]                  Time coordinating discharge: 39 minutes  Signed:  Shakyla Nolley  [facility] 11/28/2020, 3:25 PM

## 2020-11-28 NOTE — Progress Notes (Signed)
DISCHARGE NOTE HOME [name] to be discharged Home per MD order. Discussed prescriptions and follow up appointments with the patient. Prescriptions given to patient; medication list explained in detail. Patient verbalized understanding.  Skin clean, dry and intact without evidence of skin break down, no evidence of skin tears noted. IV catheter discontinued intact. Site without signs and symptoms of complications. Dressing and pressure applied. Pt denies pain at the site currently. No complaints noted.  Patient free of lines, drains, and wounds.   An After Visit Summary (AVS) was printed and given to the patient. Patient escorted via wheelchair, and discharged home via [place].  Myrtis Hopping, RN

## 2020-11-28 NOTE — Plan of Care (Signed)

## 2020-11-28 NOTE — Plan of Care (Signed)
  Problem: Education: Goal: Knowledge of General Education information will improve Description: Including pain rating scale, medication(s)/side effects and non-pharmacologic comfort measures Outcome: Progressing   Problem: Health Behavior/Discharge Planning: Goal: Ability to manage health-related needs will improve Outcome: Not Progressing   Problem: Activity: Goal: Risk for activity intolerance will decrease Outcome: Progressing   Problem: Nutrition: Goal: Adequate nutrition will be maintained Outcome: Progressing   Problem: Coping: Goal: Level of anxiety will decrease Outcome: Not Progressing

## 2020-12-02 ENCOUNTER — Telehealth: Payer: Self-pay

## 2020-12-02 NOTE — Telephone Encounter (Signed)
Transition Care Management Unsuccessful Follow-up Telephone Call  Date of discharge and from where:  11/28/2020, [facility]   Attempts:  1st Attempt  Reason for unsuccessful TCM follow-up call:  Unable to reach patient  calls placed x2  to # [phone] and the recording stated that the call could not be completed at this time.

## 2020-12-02 NOTE — Telephone Encounter (Signed)
Vicki Moore, pt called back and states that her good # is [phone], she can be reached around 9 or 10 8/23. Wanted to reach out to you as she was sharing what difficulty she has had, availability for HFU is end of Sept /early Oct. She was explaining her issues and thought maybe you could do better working her in? I would have been more than happy to schedule her out a ways but felt that due to her circumstances you might could do better. FU  again at [phone]. Thanks very much! PEC/ Melody/6:15 pm

## 2020-12-03 ENCOUNTER — Telehealth: Payer: Self-pay

## 2020-12-03 NOTE — Telephone Encounter (Signed)
Call returned to patient at # [phone], the person who answered the phone said it was the wrong number when this CM asked for Monique . He then hung up

## 2020-12-03 NOTE — Telephone Encounter (Signed)
Transition Care Management Unsuccessful Follow-up Telephone Call  Date of discharge and from where:  11/28/2020, [facility]   Attempts:  2nd Attempt  Reason for unsuccessful TCM follow-up call:  Unable to reach patient- call placed to # [phone] and the recording stated that the call could not be completed at this time.  Patient had called the clinic yesterday and left a message requesting a call back to # [phone]. This CM called this number back and the person who answered said it was the wrong number when this CM asked for Emilianna. He then hung up.

## 2020-12-04 ENCOUNTER — Telehealth: Payer: Self-pay

## 2020-12-04 LAB — SURGICAL PATHOLOGY

## 2020-12-04 NOTE — Telephone Encounter (Signed)
Transition Care Management Unsuccessful Follow-up Telephone Call  Date of discharge and from where:  11/28/2020, [facility]   Attempts:  3rd Attempt  Reason for unsuccessful TCM follow-up call:  Unable to reach patient - call placed to # [phone] and the recording stated that the call could not be completed at this time.  Letter sent to patient requesting she call this clinic to schedule a hospital follow up appointment.

## 2020-12-06 ENCOUNTER — Emergency Department (HOSPITAL_COMMUNITY): Payer: Medicaid Other

## 2020-12-06 ENCOUNTER — Other Ambulatory Visit: Payer: Self-pay

## 2020-12-06 ENCOUNTER — Emergency Department (HOSPITAL_COMMUNITY)
Admission: EM | Admit: 2020-12-06 | Discharge: 2020-12-06 | Disposition: A | Discharge status: Home / Self Care | Payer: Medicaid Other | Attending: Emergency Medicine | Admitting: Emergency Medicine

## 2020-12-06 DIAGNOSIS — R0602 Shortness of breath: Secondary | ICD-10-CM | POA: Diagnosis not present

## 2020-12-06 DIAGNOSIS — R0902 Hypoxemia: Secondary | ICD-10-CM | POA: Diagnosis not present

## 2020-12-06 DIAGNOSIS — R410 Disorientation, unspecified: Secondary | ICD-10-CM | POA: Diagnosis present

## 2020-12-06 DIAGNOSIS — R000 Tachycardia, unspecified: Secondary | ICD-10-CM | POA: Diagnosis not present

## 2020-12-06 DIAGNOSIS — I499 Cardiac arrhythmia, unspecified: Secondary | ICD-10-CM | POA: Diagnosis not present

## 2020-12-06 DIAGNOSIS — O0289 Other abnormal products of conception: Secondary | ICD-10-CM | POA: Diagnosis not present

## 2020-12-06 DIAGNOSIS — J45909 Unspecified asthma, uncomplicated: Secondary | ICD-10-CM | POA: Diagnosis not present

## 2020-12-06 DIAGNOSIS — Z91010 Allergy to peanuts: Secondary | ICD-10-CM | POA: Diagnosis not present

## 2020-12-06 DIAGNOSIS — Z79899 Other long term (current) drug therapy: Secondary | ICD-10-CM | POA: Insufficient documentation

## 2020-12-06 DIAGNOSIS — Y904 Blood alcohol level of 80-99 mg/100 ml: Secondary | ICD-10-CM | POA: Insufficient documentation

## 2020-12-06 DIAGNOSIS — G9340 Encephalopathy, unspecified: Secondary | ICD-10-CM | POA: Diagnosis not present

## 2020-12-06 DIAGNOSIS — R4020 Unspecified coma: Secondary | ICD-10-CM | POA: Diagnosis not present

## 2020-12-06 DIAGNOSIS — Z7951 Long term (current) use of inhaled steroids: Secondary | ICD-10-CM | POA: Insufficient documentation

## 2020-12-06 DIAGNOSIS — R0689 Other abnormalities of breathing: Secondary | ICD-10-CM | POA: Diagnosis not present

## 2020-12-06 DIAGNOSIS — F17210 Nicotine dependence, cigarettes, uncomplicated: Secondary | ICD-10-CM | POA: Insufficient documentation

## 2020-12-06 DIAGNOSIS — R404 Transient alteration of awareness: Secondary | ICD-10-CM | POA: Diagnosis not present

## 2020-12-06 DIAGNOSIS — F1910 Other psychoactive substance abuse, uncomplicated: Secondary | ICD-10-CM | POA: Insufficient documentation

## 2020-12-06 LAB — I-STAT ARTERIAL BLOOD GAS, ED
Acid-base deficit: 2 mmol/L (ref 0.0–2.0)
Bicarbonate: 21.2 mmol/L (ref 20.0–28.0)
Calcium, Ion: 1.17 mmol/L (ref 1.15–1.40)
HCT: 29 % — ABNORMAL LOW (ref 36.0–46.0)
Hemoglobin: 9.9 g/dL — ABNORMAL LOW (ref 12.0–15.0)
O2 Saturation: 91 %
Patient temperature: 98.5
Potassium: 3.7 mmol/L (ref 3.5–5.1)
Sodium: 142 mmol/L (ref 135–145)
TCO2: 22 mmol/L (ref 22–32)
pCO2 arterial: 29.3 mmHg — ABNORMAL LOW (ref 32.0–48.0)
pH, Arterial: 7.466 — ABNORMAL HIGH (ref 7.350–7.450)
pO2, Arterial: 57 mmHg — ABNORMAL LOW (ref 83.0–108.0)

## 2020-12-06 LAB — RAPID URINE DRUG SCREEN, HOSP PERFORMED
Amphetamines: NOT DETECTED
Barbiturates: NOT DETECTED
Benzodiazepines: POSITIVE — AB
Cocaine: NOT DETECTED
Opiates: NOT DETECTED
Tetrahydrocannabinol: NOT DETECTED

## 2020-12-06 LAB — COMPREHENSIVE METABOLIC PANEL
ALT: 36 U/L (ref 0–44)
AST: 39 U/L (ref 15–41)
Albumin: 3 g/dL — ABNORMAL LOW (ref 3.5–5.0)
Alkaline Phosphatase: 72 U/L (ref 38–126)
Anion gap: 11 (ref 5–15)
BUN: 5 mg/dL — ABNORMAL LOW (ref 6–20)
CO2: 22 mmol/L (ref 22–32)
Calcium: 8.9 mg/dL (ref 8.9–10.3)
Chloride: 106 mmol/L (ref 98–111)
Creatinine, Ser: 0.55 mg/dL (ref 0.44–1.00)
GFR, Estimated: >60 mL/min (ref >60)
Glucose, Bld: 160 mg/dL — ABNORMAL HIGH (ref 70–99)
Potassium: 3.6 mmol/L (ref 3.5–5.1)
Sodium: 139 mmol/L (ref 135–145)
Total Bilirubin: 0.4 mg/dL (ref 0.3–1.2)
Total Protein: 6.3 g/dL — ABNORMAL LOW (ref 6.5–8.1)

## 2020-12-06 LAB — PREGNANCY, URINE: Preg Test, Ur: NEGATIVE

## 2020-12-06 LAB — URINALYSIS, ROUTINE W REFLEX MICROSCOPIC
Bilirubin Urine: NEGATIVE
Glucose, UA: NEGATIVE mg/dL
Hgb urine dipstick: NEGATIVE
Ketones, ur: NEGATIVE mg/dL
Leukocytes,Ua: NEGATIVE
Nitrite: NEGATIVE
Protein, ur: NEGATIVE mg/dL
Specific Gravity, Urine: 1.008 (ref 1.005–1.030)
pH: 9 — ABNORMAL HIGH (ref 5.0–8.0)

## 2020-12-06 LAB — CBC WITH DIFFERENTIAL/PLATELET
Abs Immature Granulocytes: 0.03 10*3/uL (ref 0.00–0.07)
Basophils Absolute: 0 10*3/uL (ref 0.0–0.1)
Basophils Relative: 0 %
Eosinophils Absolute: 0.2 10*3/uL (ref 0.0–0.5)
Eosinophils Relative: 4 %
HCT: 31.3 % — ABNORMAL LOW (ref 36.0–46.0)
Hemoglobin: 9.4 g/dL — ABNORMAL LOW (ref 12.0–15.0)
Immature Granulocytes: 1 %
Lymphocytes Relative: 35 %
Lymphs Abs: 1.7 10*3/uL (ref 0.7–4.0)
MCH: 24.7 pg — ABNORMAL LOW (ref 26.0–34.0)
MCHC: 30 g/dL (ref 30.0–36.0)
MCV: 82.4 fL (ref 80.0–100.0)
Monocytes Absolute: 0.4 10*3/uL (ref 0.1–1.0)
Monocytes Relative: 9 %
Neutro Abs: 2.5 10*3/uL (ref 1.7–7.7)
Neutrophils Relative %: 51 %
Platelets: 282 10*3/uL (ref 150–400)
RBC: 3.8 MIL/uL — ABNORMAL LOW (ref 3.87–5.11)
RDW: 19.4 % — ABNORMAL HIGH (ref 11.5–15.5)
WBC: 4.8 10*3/uL (ref 4.0–10.5)
nRBC: 0 % (ref 0.0–0.2)

## 2020-12-06 LAB — BRAIN NATRIURETIC PEPTIDE: B Natriuretic Peptide: 33.4 pg/mL (ref 0.0–100.0)

## 2020-12-06 LAB — MAGNESIUM: Magnesium: 1.7 mg/dL (ref 1.7–2.4)

## 2020-12-06 LAB — SALICYLATE LEVEL: Salicylate Lvl: <7 mg/dL — ABNORMAL LOW (ref 7.0–30.0)

## 2020-12-06 LAB — TROPONIN I (HIGH SENSITIVITY): Troponin I (High Sensitivity): 4 ng/L (ref <18)

## 2020-12-06 LAB — ACETAMINOPHEN LEVEL: Acetaminophen (Tylenol), Serum: <10 ug/mL — ABNORMAL LOW (ref 10–30)

## 2020-12-06 LAB — ETHANOL: Alcohol, Ethyl (B): 97 mg/dL — ABNORMAL HIGH (ref <10)

## 2020-12-06 LAB — LACTIC ACID, PLASMA: Lactic Acid, Venous: 3.1 mmol/L (ref 0.5–1.9)

## 2020-12-06 LAB — LIPASE, BLOOD: Lipase: 49 U/L (ref 11–51)

## 2020-12-06 MED ORDER — SODIUM CHLORIDE 0.9 % IV BOLUS
1000.0000 mL | Freq: Once | INTRAVENOUS | Status: AC
  Administered 2020-12-06: 1000 mL via INTRAVENOUS

## 2020-12-06 MED ORDER — NALOXONE HCL 2 MG/2ML IJ SOSY
1.0000 mg | PREFILLED_SYRINGE | Freq: Once | INTRAMUSCULAR | Status: AC
  Administered 2020-12-06: 1 mg via INTRAVENOUS
  Filled 2020-12-06: qty 2

## 2020-12-06 NOTE — ED Notes (Signed)
As I went to d/c pt she states I need a cab ride home because they did it for me last time I was here a week ago. When asked if there was anyone pt could call she said she doesn't know her husbands number and he does not have a car. PT states she has no family or friends to come get her either. Just as I was pulling the PT iv front desk called and pt husband was outside with things to drop off for the pt. Husband was instructed to get the car and come pick pt as she was being d/c. PT taken in w/c outside to wait on ride

## 2020-12-06 NOTE — ED Provider Notes (Signed)
[facility] EMERGENCY DEPARTMENT Provider Note   CSN: [id number] Arrival date & time: 12/06/20  1617     History Chief Complaint  Patient presents with   Altered Mental Status    Vicki Moore is a 40 y.o. female.  Patient called EMS due to shortness of breath.  EMS thought her blood sugar was low at 81 and gave her some D50.  EMS reports that she states that she felt better when she arrived in the ER.  However when I speak with the patient she appears confused unable to give any history.      Past Medical History:  Diagnosis Date   Anxiety    Asthma    Asthma    Depression    GERD (gastroesophageal reflux disease)     Patient Active Problem List   Diagnosis Date Noted   WPW (Wolff-Parkinson-White syndrome) 11/24/2020   Hiatal hernia    Non-intractable vomiting    Acute asthma exacerbation 11/20/2020   Acute hypoxemic respiratory failure (HCC) 11/20/2020   Abdominal pain 11/20/2020   SVT (supraventricular tachycardia) (HCC) 11/20/2020   Lactic acidosis 11/20/2020   Acute respiratory failure with hypoxia (HCC) 11/10/2020   Elevated LFTs 11/10/2020   Opioid abuse (HCC) 11/10/2020   Chronic prescription opiate use 08/04/2020   Benzodiazepine withdrawal (HCC) 08/03/2020   Chronic pain 08/03/2020   Hypokalemia 08/03/2020   Alcohol abuse 08/03/2020   Tobacco abuse 08/03/2020   Anxiety disorder    Nephrolithiasis 01/10/2020   Biliary colic    Right upper quadrant pain 11/08/2019   RUQ pain 11/07/2019   GERD (gastroesophageal reflux disease) 11/07/2019   Bilateral foot pain 12/08/2017   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   Hypoxemia 08/08/2017   Unspecified asthma with (acute) exacerbation 08/08/2017   Influenza A with pneumonia 04/29/2017   Xanax use disorder, 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   Drug overdose 04/23/2017   Polysubstance abuse (HCC) 04/23/2017   Anxiety state 03/22/2009   Asthma 01/02/2009   LOW BACK PAIN 01/02/2009     Past Surgical History:  Procedure Laterality Date   BIOPSY  11/23/2020   Procedure: BIOPSY;  Surgeon: Shellia Cleverly, DO;  Location: [facility];  Service: Gastroenterology;;   CHOLECYSTECTOMY N/A 12/08/2019   Procedure: LAPAROSCOPIC CHOLECYSTECTOMY;  Surgeon: Franky Macho, MD;  Location: [facility];  Service: General;  Laterality: N/A;   ESOPHAGOGASTRODUODENOSCOPY (EGD) WITH PROPOFOL N/A 11/23/2020   Procedure: ESOPHAGOGASTRODUODENOSCOPY (EGD) WITH PROPOFOL;  Surgeon: Shellia Cleverly, DO;  Location: [facility];  Service: Gastroenterology;  Laterality: N/A;     OB History   No obstetric history on file.     No family history on file.  Social History   Tobacco Use   Smoking status: Some Days    Packs/day: 0.25    Years: 10.00    Pack years: 2.50    Types: Cigarettes   Smokeless tobacco: Never  Vaping Use   Vaping Use: Never used  Substance Use Topics   Alcohol use: Yes    Comment: rare   Drug use: Not Currently    Home Medications Prior to Admission medications   Medication Sig Start Date End Date Taking? Authorizing Provider  acetaminophen (TYLENOL) 325 MG tablet Take 2 tablets (650 mg total) by mouth every 6 (six) hours as needed. 11/19/20   Sloan Leiter, DO  acidophilus (RISAQUAD) CAPS capsule Take 1 capsule by mouth daily. 11/28/20   Pokhrel, Rebekah Chesterfield, MD  alprazolam Prudy Feeler) 2 MG tablet Take 1 tablet (2 mg  total) by mouth 3 (three) times daily as needed for up to 10 days for sleep or anxiety. 11/28/20 12/08/20  Pokhrel, Rebekah Chesterfield, MD  Calcium Carbonate Antacid (TUMS PO) Take 1-2 tablets by mouth every hour as needed (acid reflux/heartburn).    [provider]  dicyclomine (BENTYL) 20 MG tablet Take 1 tablet (20 mg total) by mouth 2 (two) times daily. 11/19/20   Sloan Leiter, DO  fluticasone-salmeterol (ADVAIR DISKUS) 500-50 MCG/ACT AEPB Inhale 1 puff into the lungs 2 (two) times daily. 11/28/20   Pokhrel, Rebekah Chesterfield, MD  ipratropium-albuterol (DUONEB) 0.5-2.5 (3)  MG/3ML SOLN Take 3 mLs by nebulization 3 (three) times daily as needed for wheezing or shortness of breath. 10/11/19   [provider]  levalbuterol Pauline Aus HFA) 45 MCG/ACT inhaler Inhale 2 puffs into the lungs every 4 (four) hours as needed for wheezing. 11/28/20 11/28/21  Pokhrel, Rebekah Chesterfield, MD  metoprolol succinate (TOPROL-XL) 25 MG 24 hr tablet Take 1 tablet (25 mg total) by mouth at bedtime. 11/28/20   Pokhrel, Rebekah Chesterfield, MD  nicotine (NICODERM CQ - DOSED IN MG/24 HOURS) 14 mg/24hr patch Place 1 patch (14 mg total) onto the skin daily. 08/07/20   Pokhrel, Rebekah Chesterfield, MD  ondansetron (ZOFRAN ODT) 4 MG disintegrating tablet Take 1 tablet (4 mg total) by mouth every 8 (eight) hours as needed for up to 10 days for nausea or vomiting. 11/28/20 12/08/20  Pokhrel, Rebekah Chesterfield, MD  pantoprazole (PROTONIX) 40 MG tablet Take 1 tablet (40 mg total) by mouth 2 (two) times daily before a meal. 11/15/19   Amin, Loura Halt, MD  potassium chloride SA (KLOR-CON) 20 MEQ tablet Take 1 tablet (20 mEq total) by mouth daily for 10 days. 11/28/20 12/08/20  Pokhrel, Rebekah Chesterfield, MD  promethazine (PHENERGAN) 12.5 MG tablet Take 1 tablet (12.5 mg total) by mouth every 6 (six) hours as needed for nausea or vomiting. 11/28/20   Pokhrel, Rebekah Chesterfield, MD  tetrahydrozoline-zinc (VISINE-AC) 0.05-0.25 % ophthalmic solution Place 1 drop into both eyes 3 (three) times daily as needed (dry eyes).    [provider]  valACYclovir (VALTREX) 1000 MG tablet Take 1,000 mg by mouth 2 (two) times daily as needed (breakouts). 02/09/20   [provider]    Allergies    Peanut-containing drug products and Penicillins  Review of Systems   Review of Systems  Unable to perform ROS: Mental status change   Physical Exam Updated Vital Signs BP 119/72 (BP Location: Right Arm)   Pulse (!) 107   Temp 98.5 F (36.9 C) (Oral)   Resp 18   SpO2 96%   Physical Exam Constitutional:      General: She is in acute distress.     Appearance: Normal  appearance.  HENT:     Head: Normocephalic.     Nose: Nose normal.  Eyes:     Extraocular Movements: Extraocular movements intact.  Cardiovascular:     Rate and Rhythm: Tachycardia present.  Pulmonary:     Effort: Pulmonary effort is normal.     Breath sounds: Wheezing present.     Comments: Mild wheezes Musculoskeletal:        General: Normal range of motion.     Cervical back: Normal range of motion.  Neurological:     General: No focal deficit present.     Mental Status: She is alert. Mental status is at baseline.    ED Results / Procedures / Treatments   Labs (all labs ordered are listed, but only abnormal results are displayed) Labs  Reviewed  CBC WITH DIFFERENTIAL/PLATELET - Abnormal; Notable for the following components:      Result Value   RBC 3.80 (*)    Hemoglobin 9.4 (*)    HCT 31.3 (*)    MCH 24.7 (*)    RDW 19.4 (*)    All other components within normal limits  COMPREHENSIVE METABOLIC PANEL - Abnormal; Notable for the following components:   Glucose, Bld 160 (*)    BUN 5 (*)    Total Protein 6.3 (*)    Albumin 3.0 (*)    All other components within normal limits  SALICYLATE LEVEL - Abnormal; Notable for the following components:   Salicylate Lvl <7.0 (*)    All other components within normal limits  ACETAMINOPHEN LEVEL - Abnormal; Notable for the following components:   Acetaminophen (Tylenol), Serum <10 (*)    All other components within normal limits  LACTIC ACID, PLASMA - Abnormal; Notable for the following components:   Lactic Acid, Venous 3.1 (*)    All other components within normal limits  URINALYSIS, ROUTINE W REFLEX MICROSCOPIC - Abnormal; Notable for the following components:   Color, Urine STRAW (*)    pH 9.0 (*)    All other components within normal limits  ETHANOL - Abnormal; Notable for the following components:   Alcohol, Ethyl (B) 97 (*)    All other components within normal limits  RAPID URINE DRUG SCREEN, HOSP PERFORMED - Abnormal;  Notable for the following components:   Benzodiazepines POSITIVE (*)    All other components within normal limits  I-STAT ARTERIAL BLOOD GAS, ED - Abnormal; Notable for the following components:   pH, Arterial 7.466 (*)    pCO2 arterial 29.3 (*)    pO2, Arterial 57 (*)    HCT 29.0 (*)    Hemoglobin 9.9 (*)    All other components within normal limits  CULTURE, BLOOD (ROUTINE X 2)  CULTURE, BLOOD (ROUTINE X 2)  LIPASE, BLOOD  PREGNANCY, URINE  MAGNESIUM  LACTIC ACID, PLASMA  BLOOD GAS, ARTERIAL  BRAIN NATRIURETIC PEPTIDE  TROPONIN I (HIGH SENSITIVITY)    EKG EKG Interpretation  Date/Time:  Friday December 06 2020 16:27:58 EDT Ventricular Rate:  116 PR Interval:  119 QRS Duration: 113 QT Interval:  349 QTC Calculation: 485 R Axis:   14 Text Interpretation: Sinus tachycardia Incomplete left bundle branch block Confirmed by Norman ClayHong, Berel Najjar ([id number]) on 12/06/2020 6:41:45 PM  Radiology DG Chest Port 1 View  Result Date: 12/06/2020 CLINICAL DATA:  Shortness of breath EXAM: PORTABLE CHEST 1 VIEW COMPARISON:  11/19/2020 FINDINGS: Mildly low lung volumes. No focal opacity or pleural effusion. Borderline cardiac size. No pneumothorax. IMPRESSION: No active disease.  Low lung volumes Electronically Signed   By: Jasmine PangKim  Fujinaga M.D.   On: 12/06/2020 17:10    Procedures .Critical Care  Date/Time: 12/06/2020 8:58 PM Performed by: Cheryll CockayneHong, Karmyn Lowman S, MD Authorized by: Cheryll CockayneHong, Mileah Hemmer S, MD   Critical care provider statement:    Critical care time (minutes):  40   Critical care time was exclusive of:  Separately billable procedures and treating other patients and teaching time   Critical care was necessary to treat or prevent imminent or life-threatening deterioration of the following conditions:  CNS failure or compromise Comments:     Acute encephalopathy   Medications Ordered in ED Medications  sodium chloride 0.9 % bolus 1,000 mL (0 mLs Intravenous Stopped 12/06/20 1821)  naloxone (NARCAN)  injection 1 mg (1 mg Intravenous Given 12/06/20 1655)  ED Course  I have reviewed the triage vital signs and the nursing notes.  Pertinent labs & imaging results that were available during my care of the patient were reviewed by me and considered in my medical decision making (see chart for details).    MDM Rules/Calculators/A&P                           As patient was being treated in the ER her mental status has significantly improved.  She appeared to have good result with Narcan 1 mg given.  She was observed in the ER for several hours with no additional encephalopathic episodes.  She is now awake and alert cooperative and has capacity.  Denies any pain or discomfort, states that she "partied too much last night."  Advising her to follow-up with her doctor within the next 3 to 4 days, advising immediate return for worsening symptoms or any additional concerns.  Final Clinical Impression(s) / ED Diagnoses Final diagnoses:  Polysubstance abuse (HCC)  Acute encephalopathy    Rx / DC Orders ED Discharge Orders     None        Cheryll Cockayne, MD 12/06/20 2059

## 2020-12-06 NOTE — ED Notes (Signed)
Lab contacted and running BNP now off 1730 tube

## 2020-12-06 NOTE — Discharge Instructions (Addendum)
Call your primary care doctor or specialist as discussed in the next 2-3 days.   Return immediately back to the ER if:  Your symptoms worsen within the next 12-24 hours. You develop new symptoms such as new fevers, persistent vomiting, new pain, shortness of breath, or new weakness or numbness, or if you have any other concerns.  

## 2020-12-06 NOTE — ED Triage Notes (Signed)
Pt from home via EMS, called out initially for shob. On EMS arrival pt pale, cool, and tachypnic and tachycardic with GCS 8. CBG 81 and was given some dextrose for same. Lung sounds clear, and on arrival to ED pt endorses that she initially felt shob but no longer is shob. Hx MI recently, admitted here for same.

## 2020-12-11 LAB — CULTURE, BLOOD (ROUTINE X 2)
Culture: NO GROWTH
Culture: NO GROWTH
Special Requests: ADEQUATE
Special Requests: ADEQUATE

## 2020-12-12 ENCOUNTER — Telehealth: Payer: Self-pay | Admitting: Family Medicine

## 2020-12-12 NOTE — Telephone Encounter (Signed)
Patient called to advised the Alprazolam requested is not on her current medication profile, advised it was only prescribed for 10 pills and ended on 11/28/20, it dropped off her current list. She says that while she was in the hospital the cardiologist told her she needs to keep her anxiety under control to keep her heart rate down. She says the hospital doctor told her she would receive enough medication to last until she can see her provider. I advised she will need a hospital follow up visit, she asked if it could be a virtual visit because she fell at home and it's hard for her to walk. I contacted [place], [facility] via Teams about the above, she says she will schedule the patient with Dr. Alvis Lemmings on Monday 12/23/20 at 1400 for a virtual visit. I advised the patient, she verbalized understanding. I advised I will send this refill request to Dr. Alvis Lemmings for refilling, she asks for a call to let her know if this will be refilled as she is completely out, took 1 pill today and still needs 2 more. She says she takes them every 8 hours around the clock to keep her anxiety down, she's been on Xanax for years.

## 2020-12-12 NOTE — Telephone Encounter (Signed)
Medication Refill - Medication: ALPRAZolam (XANAX) tablet 2 mg  *Pt called in stating she was recently seen at the hospital two times, and stated she was diagnosed with WPW heart condition, where her heart beats really fast, and she has anxiety and if she does not get this medication urgently she can end up being sent back to the hospital, if she has a anxiety attack. Please advise.*  Has the patient contacted their pharmacy? No.  Preferred Pharmacy (with phone number or street name):  [id number] - [place], [place] - [address] AT [facility] [address] Phone:  [phone]  Fax:  [phone]      Agent: Please be advised that RX refills may take up to 3 business days. We ask that you follow-up with your pharmacy.

## 2020-12-12 NOTE — Telephone Encounter (Signed)
Requested medication (s) are due for refill today: Yes  Requested medication (s) are on the active medication list: No  Last refill:  2 weeks ago  Future visit scheduled: Yes  Notes to clinic:  Unable to refill per protocol, cannot delegate, last refilled by another provider, see TE notes from patient, will need enough to last until appointment     Requested Prescriptions  Pending Prescriptions Disp Refills   alprazolam (XANAX) 2 MG tablet 10 tablet 0    Sig: Take 1 tablet (2 mg total) by mouth 3 (three) times daily as needed for up to 10 days for sleep or anxiety.     Not Delegated - Psychiatry:  Anxiolytics/Hypnotics Failed - 12/12/2020 12:19 PM      Failed - This refill cannot be delegated      Failed - Valid encounter within last 6 months    Recent Outpatient Visits           8 months ago Anxiety and depression    [facility] [name], MD       Future Appointments             In 5 days Tillery, Mariam Dollar, PA-C [facility] [address] Office, [facility]   In 1 week [name], MD [facility]            Passed - Urine Drug Screen completed in last 360 days

## 2020-12-13 ENCOUNTER — Encounter: Payer: Self-pay | Admitting: Family

## 2020-12-13 ENCOUNTER — Telehealth (HOSPITAL_BASED_OUTPATIENT_CLINIC_OR_DEPARTMENT_OTHER): Payer: Medicaid Other | Admitting: Family

## 2020-12-13 ENCOUNTER — Telehealth: Payer: Self-pay | Admitting: Student

## 2020-12-13 DIAGNOSIS — M549 Dorsalgia, unspecified: Secondary | ICD-10-CM | POA: Diagnosis not present

## 2020-12-13 DIAGNOSIS — W19XXXA Unspecified fall, initial encounter: Secondary | ICD-10-CM

## 2020-12-13 DIAGNOSIS — F419 Anxiety disorder, unspecified: Secondary | ICD-10-CM

## 2020-12-13 DIAGNOSIS — F1910 Other psychoactive substance abuse, uncomplicated: Secondary | ICD-10-CM | POA: Diagnosis not present

## 2020-12-13 MED ORDER — IBUPROFEN 600 MG PO TABS: 600.0000 mg | ORAL_TABLET | Freq: Three times a day (TID) | ORAL | 0 refills | Status: AC | PRN

## 2020-12-13 NOTE — Telephone Encounter (Addendum)
Patient advised that her last OV was January,2022. Would need an OV to discuss medication request and options for Xanax.   Patient states she has bruised ribs after a fall in the bathtub 2 days ago. She is very sore and can hardly move. Request OV be virtual.   Encouraged patient to turn, cough, and deep breathe to prevent PNA and to continue to ambulate.  Patient verbalized understanding. Scheduled OV for today.

## 2020-12-13 NOTE — Telephone Encounter (Signed)
Returned call to patient. She reports she is struggling with her anxiety really bad - it is making her heart race. She took the last of her anxiety med yesterday. She has a dx of WPW recently. She was recently started on metoprolol succinate 25mg  - takes in the morning. She does not know what her BP or HR is - she said she was supposed to get a heart monitor but did not receive this.   She reports a history of trauma over the last year that has impacted her anxiety  She had a video visit with FNP today - he did not give her anxiety med refill - referred her to psychiatry   She would like to know if OK to take any extra metoprolol succinate - but reports her BP tends to run 90s-100s/70s

## 2020-12-13 NOTE — Telephone Encounter (Signed)
Patient would like the covering doctor to fill her alprazolam Prudy Feeler) 2 MG to hold her over until her 12/23/2020 appointment. Patient states she is completely out and she takes the medication for her anxiety, patient also states she has a heart condition and really needs this prescription filled today. Patient wold like a follow up call today.     [id number] [place], [place] - [address] AT [facility] OF [address] Phone:  [phone]  Fax:  [phone]

## 2020-12-13 NOTE — Telephone Encounter (Signed)
  Patient c/o Palpitations:  High priority if patient c/o lightheadedness, shortness of breath, or chest pain  How long have you had palpitations/irregular HR/ Afib? Are you having the symptoms now? Yes  Are you currently experiencing lightheadedness, SOB or CP? None   Do you have a history of afib (atrial fibrillation) or irregular heart rhythm? No  Have you checked your BP or HR? (document readings if available): no   Are you experiencing any other symptoms? Anxiety  Pt said when she woke up her heart is beating really fast, she said she ran out of anxiety meds and been trying so hard to make her self calm. She denied CP and SOB. She said she just feel very anxious and her heart is racing, she doesn't know what to do

## 2020-12-13 NOTE — Telephone Encounter (Signed)
She could probably take an extra 1/2 tablet - but would not do more than that daily due to low blood pressure.  Dr Rexene Edison

## 2020-12-13 NOTE — Progress Notes (Addendum)
I connected with Stefanie Libel on 12/13/2020 by a video enabled telemedicine application and verified that I am speaking with the correct person using two identifiers.   -Patient agreed to conduct this Video visit -Patient was at home -Provider was at the office -The visit lasted for 14 minutes   Vicki Moore, is a 40 y.o. female  [id number]  [id number]  DOB - 17-Mar-1981  Subjective:  Chief Complaint and HPI: Vicki Moore is a 40 y.o. female s a 40 year old female with a history of polysubstance abuse was last seen at the local emergency room on December 06, 2020 for the same issue who is visited today via video mode for pain to her left lower rib cage after suffering a fall to her bathtub about 2 nights ago, patient did not hit her head or lose consciousness.  She did not seek medical attention now she has pain.  Also patient says she has anxieties and wants either Xanax or Ambien.  Denies any chest pain, shortness of breath, suicidal thoughts or attempts, or any other symptom than pain.    ED/Hospital notes reviewed.   Social History: Reviewed Family history: Reviewed  ROS:    Constitutional:  No f/c, No night sweats, No unexplained weight loss. EENT:  No vision changes, No blurry vision, No hearing changes. No mouth, throat, or ear problems.  Respiratory: No cough, No SOB Cardiac: No CP, no palpitations GI:  No abd pain, No N/V/D. GU: No Urinary s/sx Musculoskeletal: Pain to the left rib cage after falling 2 nights ago  neuro: No headache, no dizziness, no motor weakness.  Skin: No rash Endocrine:  No polydipsia. No polyuria.  Psych: Denies SI/HI.  Endorses having anxieties  No problems updated.  ALLERGIES: Allergies  Allergen Reactions   Peanut-Containing Drug Products Hives and Shortness Of Breath   Penicillins Anaphylaxis, Hives, Nausea And Vomiting and Rash          PAST MEDICAL HISTORY: Past Medical History:  Diagnosis Date   Anxiety     Asthma    Asthma    Depression    GERD (gastroesophageal reflux disease)     MEDICATIONS AT HOME: Prior to Admission medications   Medication Sig Start Date End Date Taking? Authorizing Provider  ibuprofen (ADVIL) 600 MG tablet Take 1 tablet (600 mg total) by mouth every 8 (eight) hours as needed. 12/13/20  Yes Feliberto Gottron, FNP  acetaminophen (TYLENOL) 325 MG tablet Take 2 tablets (650 mg total) by mouth every 6 (six) hours as needed. 11/19/20   Jeanell Sparrow, DO  acidophilus (RISAQUAD) CAPS capsule Take 1 capsule by mouth daily. 11/28/20   Pokhrel, Corrie Mckusick, MD  Calcium Carbonate Antacid (TUMS PO) Take 1-2 tablets by mouth every hour as needed (acid reflux/heartburn).    [provider]  dicyclomine (BENTYL) 20 MG tablet Take 1 tablet (20 mg total) by mouth 2 (two) times daily. 11/19/20   Jeanell Sparrow, DO  fluticasone-salmeterol (ADVAIR DISKUS) 500-50 MCG/ACT AEPB Inhale 1 puff into the lungs 2 (two) times daily. 11/28/20   Pokhrel, Corrie Mckusick, MD  ipratropium-albuterol (DUONEB) 0.5-2.5 (3) MG/3ML SOLN Take 3 mLs by nebulization 3 (three) times daily as needed for wheezing or shortness of breath. 10/11/19   [provider]  levalbuterol Penne Lash HFA) 45 MCG/ACT inhaler Inhale 2 puffs into the lungs every 4 (four) hours as needed for wheezing. 11/28/20 11/28/21  Pokhrel, Corrie Mckusick, MD  metoprolol succinate (TOPROL-XL) 25 MG 24 hr tablet Take 1 tablet (25 mg total) by  mouth at bedtime. 11/28/20   Pokhrel, Corrie Mckusick, MD  nicotine (NICODERM CQ - DOSED IN MG/24 HOURS) 14 mg/24hr patch Place 1 patch (14 mg total) onto the skin daily. 08/07/20   Pokhrel, Corrie Mckusick, MD  pantoprazole (PROTONIX) 40 MG tablet Take 1 tablet (40 mg total) by mouth 2 (two) times daily before a meal. 11/15/19   Amin, Jeanella Flattery, MD  potassium chloride SA (KLOR-CON) 20 MEQ tablet Take 1 tablet (20 mEq total) by mouth daily for 10 days. 11/28/20 12/08/20  Pokhrel, Corrie Mckusick, MD  promethazine (PHENERGAN) 12.5 MG tablet Take 1 tablet  (12.5 mg total) by mouth every 6 (six) hours as needed for nausea or vomiting. 11/28/20   Pokhrel, Corrie Mckusick, MD  tetrahydrozoline-zinc (VISINE-AC) 0.05-0.25 % ophthalmic solution Place 1 drop into both eyes 3 (three) times daily as needed (dry eyes).    [provider]  valACYclovir (VALTREX) 1000 MG tablet Take 1,000 mg by mouth 2 (two) times daily as needed (breakouts). 02/09/20   [provider]     Objective:  EXAM: No physical exam was conducted due to the nature of the visit.  Except General appearance.  There were no vitals filed for this visit.  General appearance : A&OX3. NAD. Non-toxic-appearing  Data Review Lab Results  Component Value Date   HGBA1C 5.2 11/13/2019   HGBA1C  08/17/2010    5.0 (NOTE)                                                                       According to the ADA Clinical Practice Recommendations for 2011, when HbA1c is used as a screening test:   >=6.5%   Diagnostic of Diabetes Mellitus           (if abnormal result  is confirmed)  5.7-6.4%   Increased risk of developing Diabetes Mellitus  References:Diagnosis and Classification of Diabetes Mellitus,Diabetes [id number] 1):S62-S69 and Standards of Medical Care in         Diabetes - 2011,Diabetes [id number]  (Suppl 1):S11-S61.   HGBA1C  08/15/2010    4.9 (NOTE)                                                                       According to the ADA Clinical Practice Recommendations for 2011, when HbA1c is used as a screening test:   >=6.5%   Diagnostic of Diabetes Mellitus           (if abnormal result  is confirmed)  5.7-6.4%   Increased risk of developing Diabetes Mellitus  References:Diagnosis and Classification of Diabetes Mellitus,Diabetes [id number] 1):S62-S69 and Standards of Medical Care in         Diabetes - 2011,Diabetes [id number]  (Suppl 1):S11-S61.     Assessment & Plan   1. Fall, initial encounter -Go to the emergency room or call 911 or go to the  urgent care.  Verbalized understanding - ibuprofen (ADVIL) 600 MG tablet; Take 1 tablet (600 mg total) by mouth every 8 (eight) hours as  needed.  Dispense: 30 tablet; Refill: 0  2. Other acute back pain -Go to the urgent care or emergency room -Use ibuprofen as needed for pain  3. Polysubstance abuse ([place])  - Ambulatory referral to Psychiatry  4. Anxiety  - Ambulatory referral to Psychiatry     Patient have been counseled extensively about nutrition and exercise  No follow-ups on file.  The patient was given clear instructions to go to ER or return to medical center if symptoms don't improve, worsen or new problems develop. The patient verbalized understanding. The patient was told to call to get lab results if they haven't heard anything in the next week.     Feliberto Gottron, APRN, FNP-C [facility] and [facility] [place], [place]   12/13/2020, 12:05 PM

## 2020-12-13 NOTE — Telephone Encounter (Signed)
Spoke with patient and relayed message from Dr. Rennis Golden She voiced understanding  She does not have a home BP monitor - added to appt notes for 9/6 with Mardelle Matte PA to see about providing patient w/one

## 2020-12-16 NOTE — Progress Notes (Deleted)
PCP:  [name], MD Primary Cardiologist: Chrystie Nose, MD Electrophysiologist: Lewayne Bunting, MD   Vicki Moore is a 40 y.o. female seen today for Lewayne Bunting, MD for post hospital follow up.    Admitted 8/9 - 11/28/20 with acute asthma exacerbation. Noted to have ST vs SVT in ambulance up to 170. "Cardioverted". EP saw and started on low dose BB. Felt most likely tachycardia on strips and tele was sinus tach.   Since discharge from hospital the patient reports doing ***.  she denies chest pain, palpitations, dyspnea, PND, orthopnea, nausea, vomiting, dizziness, syncope, edema, weight gain, or early satiety.  Past Medical History:  Diagnosis Date   Anxiety    Asthma    Asthma    Depression    GERD (gastroesophageal reflux disease)    Past Surgical History:  Procedure Laterality Date   BIOPSY  11/23/2020   Procedure: BIOPSY;  Surgeon: Shellia Cleverly, DO;  Location: [facility];  Service: Gastroenterology;;   CHOLECYSTECTOMY N/A 12/08/2019   Procedure: LAPAROSCOPIC CHOLECYSTECTOMY;  Surgeon: Franky Macho, MD;  Location: [facility];  Service: General;  Laterality: N/A;   ESOPHAGOGASTRODUODENOSCOPY (EGD) WITH PROPOFOL N/A 11/23/2020   Procedure: ESOPHAGOGASTRODUODENOSCOPY (EGD) WITH PROPOFOL;  Surgeon: Shellia Cleverly, DO;  Location: [facility];  Service: Gastroenterology;  Laterality: N/A;    Current Outpatient Medications  Medication Sig Dispense Refill   acetaminophen (TYLENOL) 325 MG tablet Take 2 tablets (650 mg total) by mouth every 6 (six) hours as needed. 30 tablet 0   acidophilus (RISAQUAD) CAPS capsule Take 1 capsule by mouth daily. 60 capsule 0   Calcium Carbonate Antacid (TUMS PO) Take 1-2 tablets by mouth every hour as needed (acid reflux/heartburn).     dicyclomine (BENTYL) 20 MG tablet Take 1 tablet (20 mg total) by mouth 2 (two) times daily. 20 tablet 0   fluticasone-salmeterol (ADVAIR DISKUS) 500-50 MCG/ACT AEPB Inhale 1 puff into the lungs 2  (two) times daily. 42 each 2   ibuprofen (ADVIL) 600 MG tablet Take 1 tablet (600 mg total) by mouth every 8 (eight) hours as needed. 30 tablet 0   ipratropium-albuterol (DUONEB) 0.5-2.5 (3) MG/3ML SOLN Take 3 mLs by nebulization 3 (three) times daily as needed for wheezing or shortness of breath.     levalbuterol (XOPENEX HFA) 45 MCG/ACT inhaler Inhale 2 puffs into the lungs every 4 (four) hours as needed for wheezing. 1 each 2   metoprolol succinate (TOPROL-XL) 25 MG 24 hr tablet Take 1 tablet (25 mg total) by mouth at bedtime. 30 tablet 2   nicotine (NICODERM CQ - DOSED IN MG/24 HOURS) 14 mg/24hr patch Place 1 patch (14 mg total) onto the skin daily. 28 patch 0   pantoprazole (PROTONIX) 40 MG tablet Take 1 tablet (40 mg total) by mouth 2 (two) times daily before a meal. 60 tablet 0   potassium chloride SA (KLOR-CON) 20 MEQ tablet Take 1 tablet (20 mEq total) by mouth daily for 10 days. 10 tablet 0   promethazine (PHENERGAN) 12.5 MG tablet Take 1 tablet (12.5 mg total) by mouth every 6 (six) hours as needed for nausea or vomiting. 30 tablet 0   tetrahydrozoline-zinc (VISINE-AC) 0.05-0.25 % ophthalmic solution Place 1 drop into both eyes 3 (three) times daily as needed (dry eyes).     valACYclovir (VALTREX) 1000 MG tablet Take 1,000 mg by mouth 2 (two) times daily as needed (breakouts).     No current facility-administered medications for this visit.    Allergies  Allergen Reactions   Peanut-Containing Drug Products Hives and Shortness Of Breath   Penicillins Anaphylaxis, Hives, Nausea And Vomiting and Rash          Social History   Socioeconomic History   Marital status: Single    Spouse name: Not on file   Number of children: 1   Years of education: Not on file   Highest education level: Not on file  Occupational History   Not on file  Tobacco Use   Smoking status: Some Days    Packs/day: 0.25    Years: 10.00    Pack years: 2.50    Types: Cigarettes   Smokeless tobacco:  Never  Vaping Use   Vaping Use: Never used  Substance and Sexual Activity   Alcohol use: Yes    Comment: rare   Drug use: Not Currently   Sexual activity: Not on file  Other Topics Concern   Not on file  Social History Narrative   Not on file   Social Determinants of Health   Financial Resource Strain: Not on file  Food Insecurity: Not on file  Transportation Needs: Not on file  Physical Activity: Not on file  Stress: Not on file  Social Connections: Not on file  Intimate Partner Violence: Not on file     Review of Systems: General: No chills, fever, night sweats or weight changes  Cardiovascular:  No chest pain, dyspnea on exertion, edema, orthopnea, palpitations, paroxysmal nocturnal dyspnea Dermatological: No rash, lesions or masses Respiratory: No cough, dyspnea Urologic: No hematuria, dysuria Abdominal: No nausea, vomiting, diarrhea, bright red blood per rectum, melena, or hematemesis Neurologic: No visual changes, weakness, changes in mental status All other systems reviewed and are otherwise negative except as noted above.  Physical Exam: There were no vitals filed for this visit.  GEN- The patient is well appearing, alert and oriented x 3 today.   HEENT: normocephalic, atraumatic; sclera clear, conjunctiva pink; hearing intact; oropharynx clear; neck supple, no JVP Lymph- no cervical lymphadenopathy Lungs- Clear to ausculation bilaterally, normal work of breathing.  No wheezes, rales, rhonchi Heart- Regular rate and rhythm, no murmurs, rubs or gallops, PMI not laterally displaced GI- soft, non-tender, non-distended, bowel sounds present, no hepatosplenomegaly Extremities- no clubbing, cyanosis, or edema; DP/PT/radial pulses 2+ bilaterally MS- no significant deformity or atrophy Skin- warm and dry, no rash or lesion Psych- euthymic mood, full affect Neuro- strength and sensation are intact  EKG is ordered. Personal review of EKG from today shows  ***  Additional studies reviewed include: Previous EP office notes.   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  2. Palpitations There was some question of SVT. Reviewed strips have all been most consistent with Sinus tachycardia.  Re-assurance given. It remains most likely that her sinus tachycardia is NOT a primary issue.   3. Anxiety Large contributor to several of her issues.  Graciella Freer, PA-C  12/16/20 12:46 PM

## 2020-12-17 ENCOUNTER — Ambulatory Visit: Payer: Medicaid Other | Admitting: Student

## 2020-12-17 DIAGNOSIS — R002 Palpitations: Secondary | ICD-10-CM

## 2020-12-17 DIAGNOSIS — F419 Anxiety disorder, unspecified: Secondary | ICD-10-CM

## 2020-12-17 DIAGNOSIS — I456 Pre-excitation syndrome: Secondary | ICD-10-CM

## 2020-12-23 ENCOUNTER — Ambulatory Visit: Payer: Medicaid Other | Attending: Family Medicine | Admitting: Family Medicine

## 2020-12-23 ENCOUNTER — Other Ambulatory Visit: Payer: Self-pay

## 2021-02-05 ENCOUNTER — Ambulatory Visit: Payer: Medicaid Other | Admitting: Internal Medicine

## 2021-02-13 ENCOUNTER — Emergency Department (HOSPITAL_COMMUNITY)
Admission: EM | Admit: 2021-02-13 | Discharge: 2021-02-14 | Disposition: A | Discharge status: Home / Self Care | Payer: Medicaid Other | Attending: Emergency Medicine | Admitting: Emergency Medicine

## 2021-02-13 ENCOUNTER — Other Ambulatory Visit: Payer: Self-pay

## 2021-02-13 DIAGNOSIS — R1010 Upper abdominal pain, unspecified: Secondary | ICD-10-CM | POA: Diagnosis present

## 2021-02-13 DIAGNOSIS — F17210 Nicotine dependence, cigarettes, uncomplicated: Secondary | ICD-10-CM | POA: Diagnosis not present

## 2021-02-13 DIAGNOSIS — K219 Gastro-esophageal reflux disease without esophagitis: Secondary | ICD-10-CM | POA: Insufficient documentation

## 2021-02-13 DIAGNOSIS — J45909 Unspecified asthma, uncomplicated: Secondary | ICD-10-CM | POA: Diagnosis not present

## 2021-02-13 DIAGNOSIS — N9489 Other specified conditions associated with female genital organs and menstrual cycle: Secondary | ICD-10-CM | POA: Insufficient documentation

## 2021-02-13 DIAGNOSIS — Z7951 Long term (current) use of inhaled steroids: Secondary | ICD-10-CM | POA: Diagnosis not present

## 2021-02-13 DIAGNOSIS — K760 Fatty (change of) liver, not elsewhere classified: Secondary | ICD-10-CM | POA: Diagnosis not present

## 2021-02-13 DIAGNOSIS — K429 Umbilical hernia without obstruction or gangrene: Secondary | ICD-10-CM | POA: Diagnosis not present

## 2021-02-13 DIAGNOSIS — R0789 Other chest pain: Secondary | ICD-10-CM | POA: Diagnosis not present

## 2021-02-13 DIAGNOSIS — R1084 Generalized abdominal pain: Secondary | ICD-10-CM | POA: Diagnosis not present

## 2021-02-13 DIAGNOSIS — I10 Essential (primary) hypertension: Secondary | ICD-10-CM | POA: Diagnosis not present

## 2021-02-13 DIAGNOSIS — R0902 Hypoxemia: Secondary | ICD-10-CM | POA: Diagnosis not present

## 2021-02-13 DIAGNOSIS — Z91010 Allergy to peanuts: Secondary | ICD-10-CM | POA: Insufficient documentation

## 2021-02-13 DIAGNOSIS — R079 Chest pain, unspecified: Secondary | ICD-10-CM | POA: Diagnosis not present

## 2021-02-13 DIAGNOSIS — R109 Unspecified abdominal pain: Secondary | ICD-10-CM | POA: Diagnosis not present

## 2021-02-13 MED ORDER — OXYCODONE-ACETAMINOPHEN 5-325 MG PO TABS
2.0000 | ORAL_TABLET | Freq: Once | ORAL | Status: AC
Start: 2021-02-14 — End: 2021-02-14
  Administered 2021-02-14: 2 via ORAL
  Filled 2021-02-13: qty 2

## 2021-02-13 MED ORDER — ONDANSETRON 4 MG PO TBDP
4.0000 mg | ORAL_TABLET | Freq: Once | ORAL | Status: AC
  Administered 2021-02-14: 4 mg via ORAL
  Filled 2021-02-13: qty 1

## 2021-02-13 NOTE — ED Provider Notes (Signed)
Emergency Medicine Provider Triage Evaluation Note  Vicki Moore , a 40 y.o. female  was evaluated in triage.  Pt complains of abdominal pain.  Reports associated nausea and vomiting for 36 hours.  States that her umbilical hernia is enlarged and more tender to palpation than normal.  She also reports some intermittent chest pain.  Denies any treatments prior to arrival..  Review of Systems  Positive: Chest pain, abdominal pain, nausea, and vomiting Negative: Fever, chills  Physical Exam  BP (!) 128/98 (BP Location: Left Arm)   Pulse 91   Temp 97.6 F (36.4 C) (Oral)   Resp 16   SpO2 99%  Gen:   Awake, no distress   Resp:  Normal effort  MSK:   Moves extremities without difficulty  Other:  Tender umbilical hernia and generalized upper abdominal tenderness, but no erythema  Medical Decision Making  Medically screening exam initiated at 11:47 PM.  Appropriate orders placed.  [name] was informed that the remainder of the evaluation will be completed by another provider, this initial triage assessment does not replace that evaluation, and the importance of remaining in the ED until their evaluation is complete.  Abdominal pain   Roxy Horseman, PA-C 02/13/21 [id number]    Sabas Sous, MD 02/14/21 [phone]

## 2021-02-13 NOTE — ED Triage Notes (Signed)
Pt from home via [facility] for CP & abd pain, N/V x36hrs. New abdominal hernia within the last year. Per EMS, CP is reproducible, EKG shows hx WPW.  Hx WPW, GERD, SVT  VSS

## 2021-02-14 ENCOUNTER — Emergency Department (HOSPITAL_COMMUNITY): Payer: Medicaid Other

## 2021-02-14 ENCOUNTER — Other Ambulatory Visit: Payer: Self-pay

## 2021-02-14 DIAGNOSIS — K760 Fatty (change of) liver, not elsewhere classified: Secondary | ICD-10-CM | POA: Diagnosis not present

## 2021-02-14 DIAGNOSIS — R109 Unspecified abdominal pain: Secondary | ICD-10-CM | POA: Diagnosis not present

## 2021-02-14 LAB — COMPREHENSIVE METABOLIC PANEL
ALT: 22 U/L (ref 0–44)
AST: 21 U/L (ref 15–41)
Albumin: 3.2 g/dL — ABNORMAL LOW (ref 3.5–5.0)
Alkaline Phosphatase: 101 U/L (ref 38–126)
Anion gap: 8 (ref 5–15)
BUN: <5 mg/dL — ABNORMAL LOW (ref 6–20)
CO2: 25 mmol/L (ref 22–32)
Calcium: 9.4 mg/dL (ref 8.9–10.3)
Chloride: 105 mmol/L (ref 98–111)
Creatinine, Ser: 0.61 mg/dL (ref 0.44–1.00)
GFR, Estimated: >60 mL/min (ref >60)
Glucose, Bld: 108 mg/dL — ABNORMAL HIGH (ref 70–99)
Potassium: 3.5 mmol/L (ref 3.5–5.1)
Sodium: 138 mmol/L (ref 135–145)
Total Bilirubin: 0.6 mg/dL (ref 0.3–1.2)
Total Protein: 7.5 g/dL (ref 6.5–8.1)

## 2021-02-14 LAB — TROPONIN I (HIGH SENSITIVITY)
Troponin I (High Sensitivity): 3 ng/L (ref <18)
Troponin I (High Sensitivity): 3 ng/L (ref <18)

## 2021-02-14 LAB — CBC WITH DIFFERENTIAL/PLATELET
Abs Immature Granulocytes: 0.01 10*3/uL (ref 0.00–0.07)
Basophils Absolute: 0 10*3/uL (ref 0.0–0.1)
Basophils Relative: 0 %
Eosinophils Absolute: 0 10*3/uL (ref 0.0–0.5)
Eosinophils Relative: 1 %
HCT: 32.9 % — ABNORMAL LOW (ref 36.0–46.0)
Hemoglobin: 9.8 g/dL — ABNORMAL LOW (ref 12.0–15.0)
Immature Granulocytes: 0 %
Lymphocytes Relative: 26 %
Lymphs Abs: 1.3 10*3/uL (ref 0.7–4.0)
MCH: 22.3 pg — ABNORMAL LOW (ref 26.0–34.0)
MCHC: 29.8 g/dL — ABNORMAL LOW (ref 30.0–36.0)
MCV: 74.9 fL — ABNORMAL LOW (ref 80.0–100.0)
Monocytes Absolute: 0.3 10*3/uL (ref 0.1–1.0)
Monocytes Relative: 7 %
Neutro Abs: 3.3 10*3/uL (ref 1.7–7.7)
Neutrophils Relative %: 66 %
Platelets: 457 10*3/uL — ABNORMAL HIGH (ref 150–400)
RBC: 4.39 MIL/uL (ref 3.87–5.11)
RDW: 16.7 % — ABNORMAL HIGH (ref 11.5–15.5)
WBC: 5 10*3/uL (ref 4.0–10.5)
nRBC: 0 % (ref 0.0–0.2)

## 2021-02-14 LAB — LIPASE, BLOOD: Lipase: 34 U/L (ref 11–51)

## 2021-02-14 LAB — I-STAT BETA HCG BLOOD, ED (MC, WL, AP ONLY): I-stat hCG, quantitative: <5 m[IU]/mL (ref <5)

## 2021-02-14 MED ORDER — IOHEXOL 300 MG/ML  SOLN
100.0000 mL | Freq: Once | INTRAMUSCULAR | Status: AC | PRN
  Administered 2021-02-14: 100 mL via INTRAVENOUS

## 2021-02-14 MED ORDER — LIDOCAINE VISCOUS HCL 2 % MT SOLN
15.0000 mL | Freq: Once | OROMUCOSAL | Status: AC
  Administered 2021-02-14: 15 mL via ORAL
  Filled 2021-02-14: qty 15

## 2021-02-14 MED ORDER — OXYCODONE-ACETAMINOPHEN 5-325 MG PO TABS: 1.0000 | ORAL_TABLET | Freq: Three times a day (TID) | ORAL | 0 refills | Status: AC | PRN

## 2021-02-14 MED ORDER — ALUM & MAG HYDROXIDE-SIMETH 200-200-20 MG/5ML PO SUSP
30.0000 mL | Freq: Once | ORAL | Status: AC
  Administered 2021-02-14: 30 mL via ORAL
  Filled 2021-02-14: qty 30

## 2021-02-14 MED ORDER — HYDROMORPHONE HCL 1 MG/ML IJ SOLN
1.0000 mg | Freq: Once | INTRAMUSCULAR | Status: AC
  Administered 2021-02-14: 1 mg via INTRAVENOUS
  Filled 2021-02-14: qty 1

## 2021-02-14 MED ORDER — LORAZEPAM 2 MG/ML IJ SOLN: 1.0000 mg | Freq: Once | INTRAMUSCULAR | Status: DC

## 2021-02-14 MED ORDER — OXYCODONE-ACETAMINOPHEN 5-325 MG PO TABS
1.0000 | ORAL_TABLET | Freq: Once | ORAL | Status: AC
  Administered 2021-02-14: 1 via ORAL
  Filled 2021-02-14: qty 1

## 2021-02-14 MED ORDER — FENTANYL CITRATE PF 50 MCG/ML IJ SOSY
50.0000 ug | PREFILLED_SYRINGE | Freq: Once | INTRAMUSCULAR | Status: AC
  Administered 2021-02-14: 50 ug via INTRAVENOUS
  Filled 2021-02-14: qty 1

## 2021-02-14 MED ORDER — HYDROMORPHONE HCL 1 MG/ML IJ SOLN
0.5000 mg | Freq: Once | INTRAMUSCULAR | Status: AC
Start: 2021-02-14 — End: 2021-02-14
  Administered 2021-02-14: 0.5 mg via INTRAVENOUS
  Filled 2021-02-14: qty 1

## 2021-02-14 MED ORDER — LORAZEPAM 2 MG/ML IJ SOLN
1.0000 mg | Freq: Once | INTRAMUSCULAR | Status: AC
  Administered 2021-02-14: 1 mg via INTRAMUSCULAR
  Filled 2021-02-14: qty 1

## 2021-02-14 NOTE — ED Provider Notes (Signed)
[facility] EMERGENCY DEPARTMENT Provider Note   CSN: [id number] Arrival date & time: 02/13/21  2336     History Chief Complaint  Patient presents with   Abdominal Pain   Chest Pain    Vicki Moore is a 40 y.o. female.  Presenting to ER with concern for abdominal pain.  Patient reports that she has been having abdominal pain, nausea vomiting for the past few days.  Pain is worse in her upper abdomen.  No alleviating or aggravating factors.  Pain is constant.  Also feels more distended than normal.  Vomit is nonbloody nonbilious.  No chills or fevers.  No cough.  Also had some associated chest discomfort.  Chest pain is resolved.  Now only having the abdominal pain.  HPI     Past Medical History:  Diagnosis Date   Anxiety    Asthma    Asthma    Depression    GERD (gastroesophageal reflux disease)     Patient Active Problem List   Diagnosis Date Noted   WPW (Wolff-Parkinson-White syndrome) 11/24/2020   Hiatal hernia    Non-intractable vomiting    Acute asthma exacerbation 11/20/2020   Acute hypoxemic respiratory failure ([place]) 11/20/2020   Abdominal pain 11/20/2020   SVT (supraventricular tachycardia) ([place]) 11/20/2020   Lactic acidosis 11/20/2020   Acute respiratory failure with hypoxia ([place]) 11/10/2020   Elevated LFTs 11/10/2020   Opioid abuse ([place]) 11/10/2020   Chronic prescription opiate use 08/04/2020   Benzodiazepine withdrawal ([place]) 08/03/2020   Chronic pain 08/03/2020   Hypokalemia 08/03/2020   Alcohol abuse 08/03/2020   Tobacco abuse 08/03/2020   Anxiety disorder    Nephrolithiasis [id number]   Biliary colic    Right upper quadrant pain 11/08/2019   RUQ pain 11/07/2019   GERD (gastroesophageal reflux disease) 11/07/2019   Bilateral foot pain 12/08/2017   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   Hypoxemia 08/08/2017   Unspecified asthma with (acute) exacerbation 08/08/2017   Influenza A with pneumonia 04/29/2017   Xanax use  disorder, 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   Drug overdose 04/23/2017   Polysubstance abuse ([place]) 04/23/2017   Anxiety state 03/22/2009   Asthma 01/02/2009   LOW BACK PAIN 01/02/2009    Past Surgical History:  Procedure Laterality Date   BIOPSY  11/23/2020   Procedure: BIOPSY;  Surgeon: Lavena Bullion, DO;  Location: [facility];  Service: Gastroenterology;;   CHOLECYSTECTOMY N/A 12/08/2019   Procedure: LAPAROSCOPIC CHOLECYSTECTOMY;  Surgeon: Aviva Signs, MD;  Location: [facility];  Service: General;  Laterality: N/A;   ESOPHAGOGASTRODUODENOSCOPY (EGD) WITH PROPOFOL N/A 11/23/2020   Procedure: ESOPHAGOGASTRODUODENOSCOPY (EGD) WITH PROPOFOL;  Surgeon: Lavena Bullion, DO;  Location: [facility];  Service: Gastroenterology;  Laterality: N/A;     OB History   No obstetric history on file.     No family history on file.  Social History   Tobacco Use   Smoking status: Some Days    Packs/day: 0.25    Years: 10.00    Pack years: 2.50    Types: Cigarettes   Smokeless tobacco: Never  Vaping Use   Vaping Use: Never used  Substance Use Topics   Alcohol use: Yes    Comment: rare   Drug use: Not Currently    Home Medications Prior to Admission medications   Medication Sig Start Date End Date Taking? Authorizing Provider  oxyCODONE-acetaminophen (PERCOCET/ROXICET) 5-325 MG tablet Take 1 tablet by mouth every 8 (eight) hours as needed for up to 3 days for severe  pain. 02/14/21 02/17/21 Yes Talisha Erby, Ellwood Dense, MD  acetaminophen (TYLENOL) 325 MG tablet Take 2 tablets (650 mg total) by mouth every 6 (six) hours as needed. 11/19/20   Jeanell Sparrow, DO  acidophilus (RISAQUAD) CAPS capsule Take 1 capsule by mouth daily. 11/28/20   Pokhrel, Corrie Mckusick, MD  Calcium Carbonate Antacid (TUMS PO) Take 1-2 tablets by mouth every hour as needed (acid reflux/heartburn).    [provider]  dicyclomine (BENTYL) 20 MG tablet Take 1 tablet (20 mg total) by mouth 2 (two) times daily. 11/19/20   Jeanell Sparrow, DO  fluticasone-salmeterol (ADVAIR DISKUS) 500-50 MCG/ACT AEPB Inhale 1 puff into the lungs 2 (two) times daily. 11/28/20   Pokhrel, Corrie Mckusick, MD  ibuprofen (ADVIL) 600 MG tablet Take 1 tablet (600 mg total) by mouth every 8 (eight) hours as needed. 12/13/20   Feliberto Gottron, FNP  ipratropium-albuterol (DUONEB) 0.5-2.5 (3) MG/3ML SOLN Take 3 mLs by nebulization 3 (three) times daily as needed for wheezing or shortness of breath. 10/11/19   [provider]  levalbuterol Penne Lash HFA) 45 MCG/ACT inhaler Inhale 2 puffs into the lungs every 4 (four) hours as needed for wheezing. 11/28/20 11/28/21  Pokhrel, Corrie Mckusick, MD  metoprolol succinate (TOPROL-XL) 25 MG 24 hr tablet Take 1 tablet (25 mg total) by mouth at bedtime. 11/28/20   Pokhrel, Corrie Mckusick, MD  nicotine (NICODERM CQ - DOSED IN MG/24 HOURS) 14 mg/24hr patch Place 1 patch (14 mg total) onto the skin daily. 08/07/20   Pokhrel, Corrie Mckusick, MD  pantoprazole (PROTONIX) 40 MG tablet Take 1 tablet (40 mg total) by mouth 2 (two) times daily before a meal. 11/15/19   Amin, Jeanella Flattery, MD  potassium chloride SA (KLOR-CON) 20 MEQ tablet Take 1 tablet (20 mEq total) by mouth daily for 10 days. 11/28/20 12/08/20  Pokhrel, Corrie Mckusick, MD  promethazine (PHENERGAN) 12.5 MG tablet Take 1 tablet (12.5 mg total) by mouth every 6 (six) hours as needed for nausea or vomiting. 11/28/20   Pokhrel, Corrie Mckusick, MD  tetrahydrozoline-zinc (VISINE-AC) 0.05-0.25 % ophthalmic solution Place 1 drop into both eyes 3 (three) times daily as needed (dry eyes).    [provider]  valACYclovir (VALTREX) 1000 MG tablet Take 1,000 mg by mouth 2 (two) times daily as needed (breakouts). 02/09/20   [provider]    Allergies    Peanut-containing drug products and Penicillins  Review of Systems   Review of Systems  Constitutional:  Positive for fatigue. Negative for chills and fever.  HENT:  Negative for ear pain and sore throat.   Eyes:  Negative for pain and visual  disturbance.  Respiratory:  Negative for cough and shortness of breath.   Cardiovascular:  Positive for chest pain. Negative for palpitations.  Gastrointestinal:  Positive for abdominal pain, nausea and vomiting.  Genitourinary:  Negative for dysuria and hematuria.  Musculoskeletal:  Negative for arthralgias and back pain.  Skin:  Negative for color change and rash.  Neurological:  Negative for seizures and syncope.  All other systems reviewed and are negative.  Physical Exam Updated Vital Signs BP 117/79 (BP Location: Right Arm)   Pulse 100   Temp 98.2 F (36.8 C) (Oral)   Resp 15   Ht 5\' 1"  (1.549 m)   Wt 81.6 kg   SpO2 98%   BMI 34.01 kg/m   Physical Exam Vitals and nursing note reviewed.  Constitutional:      General: She is not in acute distress.    Appearance: She is well-developed.  HENT:     Head: Normocephalic and atraumatic.  Eyes:     Conjunctiva/sclera: Conjunctivae normal.  Cardiovascular:     Rate and Rhythm: Normal rate and regular rhythm.     Heart sounds: No murmur heard. Pulmonary:     Effort: Pulmonary effort is normal. No respiratory distress.     Breath sounds: Normal breath sounds.  Abdominal:     Palpations: Abdomen is soft.     Tenderness: There is abdominal tenderness in the epigastric area.     Comments: There is generalized tenderness but worse in the epigastric region, no rebound or guarding  Musculoskeletal:     Cervical back: Neck supple.  Skin:    General: Skin is warm and dry.  Neurological:     General: No focal deficit present.     Mental Status: She is alert.  Psychiatric:        Mood and Affect: Mood normal.        Behavior: Behavior normal.    ED Results / Procedures / Treatments   Labs (all labs ordered are listed, but only abnormal results are displayed) Labs Reviewed  COMPREHENSIVE METABOLIC PANEL - Abnormal; Notable for the following components:      Result Value   Glucose, Bld 108 (*)    BUN <5 (*)    Albumin 3.2  (*)    All other components within normal limits  CBC WITH DIFFERENTIAL/PLATELET - Abnormal; Notable for the following components:   Hemoglobin 9.8 (*)    HCT 32.9 (*)    MCV 74.9 (*)    MCH 22.3 (*)    MCHC 29.8 (*)    RDW 16.7 (*)    Platelets 457 (*)    All other components within normal limits  LIPASE, BLOOD  I-STAT BETA HCG BLOOD, ED ([facility], [facility], [facility] ONLY)  TROPONIN I (HIGH SENSITIVITY)  TROPONIN I (HIGH SENSITIVITY)    EKG None  Radiology CT ABDOMEN PELVIS W CONTRAST  Result Date: 02/14/2021 CLINICAL DATA:  40 year old female with abdominal pain and distension. EXAM: CT ABDOMEN AND PELVIS WITH CONTRAST TECHNIQUE: Multidetector CT imaging of the abdomen and pelvis was performed using the standard protocol following bolus administration of intravenous contrast. CONTRAST:  151mL OMNIPAQUE IOHEXOL 300 MG/ML  SOLN COMPARISON:  CT Abdomen and Pelvis 11/19/2020 and earlier. FINDINGS: Lower chest: Negative. Hepatobiliary: Cholecystectomy since last year. Hepatic steatosis is less apparent compared to August. Pancreas: Negative. Spleen: Negative. Adrenals/Urinary Tract: Normal adrenal glands. Kidneys appears stable, nonobstructed. There are occasional small renal cysts. No hydronephrosis or hydroureter. Pelvic phleboliths, but no urinary calculus. Negative bladder. Stomach/Bowel: Large bowel is fairly decompressed and negative. The hepatic flexure is redundant. Normal appendix on series 3, image 53. A 3 cm fat containing umbilical hernia is mildly larger since last year, but does not appear incarcerated or inflamed (series 7, image 106 and series 3, image 43). Negative terminal ileum. No dilated small bowel. Questionable small gastric hiatal hernia. But otherwise negative stomach and duodenum. No free air, free fluid, mesenteric inflammation. Vascular/Lymphatic: Major vascular structures in the abdomen and pelvis appear patent and normal. No lymphadenopathy. Reproductive: Within normal limits. Other:  No pelvic free fluid. Musculoskeletal: Negative. IMPRESSION: 1. No acute or inflammatory process identified in the abdomen or pelvis. Normal appendix. 2. Small fat containing umbilical hernia has mildly enlarged since last year, but does not appear incarcerated or inflamed. Electronically Signed   By: Genevie Ann M.D.   On: 02/14/2021 05:27    Procedures Procedures  Medications Ordered in ED Medications  oxyCODONE-acetaminophen (PERCOCET/ROXICET) 5-325 MG per tablet 2 tablet (2 tablets Oral Given 02/14/21 0007)  ondansetron (ZOFRAN-ODT) disintegrating tablet 4 mg (4 mg Oral Given 02/14/21 0007)  HYDROmorphone (DILAUDID) injection 0.5 mg (0.5 mg Intravenous Given 02/14/21 0311)  iohexol (OMNIPAQUE) 300 MG/ML solution 100 mL (100 mLs Intravenous Contrast Given 02/14/21 0433)  HYDROmorphone (DILAUDID) injection 1 mg (1 mg Intravenous Given 02/14/21 0809)  alum & mag hydroxide-simeth (MAALOX/MYLANTA) 200-200-20 MG/5ML suspension 30 mL (30 mLs Oral Given 02/14/21 0809)    And  lidocaine (XYLOCAINE) 2 % viscous mouth solution 15 mL (15 mLs Oral Given 02/14/21 0809)  LORazepam (ATIVAN) injection 1 mg (1 mg Intramuscular Given 02/14/21 1022)  fentaNYL (SUBLIMAZE) injection 50 mcg (50 mcg Intravenous Given 02/14/21 1108)  oxyCODONE-acetaminophen (PERCOCET/ROXICET) 5-325 MG per tablet 1 tablet (1 tablet Oral Given 02/14/21 1440)    ED Course  I have reviewed the triage vital signs and the nursing notes.  Pertinent labs & imaging results that were available during my care of the patient were reviewed by me and considered in my medical decision making (see chart for details).    MDM Rules/Calculators/A&P                           40 year old lady presents to ER with concern for abdominal pain with some associated chest discomfort.  Regarding the chest pain, EKG without acute ischemic change, WPW noted, known history.  Troponin x2 within normal limits.  Normal sinus rhythm.  Abdominal pain work-up noted for  basic labs stable, CT scan with umbilical hernia, fat-containing, no strangulation or incarceration.  Suspect this may be because of her pain.  Provided pain control.  Touch base with general surgery, they helped arrange for outpatient follow-up.  Pain is controlled, tolerating p.o., patient remains well-appearing in no distress.  Discharged home.   After the discussed management above, the patient was determined to be safe for discharge.  The patient was in agreement with this plan and all questions regarding their care were answered.  ED return precautions were discussed and the patient will return to the ED with any significant worsening of condition.  Final Clinical Impression(s) / ED Diagnoses Final diagnoses:  Umbilical hernia without obstruction and without gangrene    Rx / DC Orders ED Discharge Orders          Ordered    oxyCODONE-acetaminophen (PERCOCET/ROXICET) 5-325 MG tablet  Every 8 hours PRN        02/14/21 1423             Milagros Loll, MD 02/15/21 0720

## 2021-02-14 NOTE — Discharge Instructions (Addendum)
Please follow-up with the general surgery office as well as your primary care doctor.  Recommend Tylenol or Motrin for pain control.  For breakthrough pain can take the prescribed Percocet.  Note this can make you drowsy and should not be taken when driving or operating heavy machinery.  Also recommend follow-up with your cardiologist regarding management of your WPW.

## 2021-02-14 NOTE — ED Notes (Signed)
Pt working IV was infiltrated and this RN attempted 2 more sticks. Need [place] IV for CT and med admin

## 2021-03-08 ENCOUNTER — Emergency Department (HOSPITAL_COMMUNITY)
Admission: EM | Admit: 2021-03-08 | Discharge: 2021-03-09 | Disposition: A | Discharge status: Home / Self Care | Payer: Medicaid Other | Attending: Emergency Medicine | Admitting: Emergency Medicine

## 2021-03-08 ENCOUNTER — Other Ambulatory Visit: Payer: Self-pay

## 2021-03-08 DIAGNOSIS — Z79899 Other long term (current) drug therapy: Secondary | ICD-10-CM | POA: Insufficient documentation

## 2021-03-08 DIAGNOSIS — R1084 Generalized abdominal pain: Secondary | ICD-10-CM | POA: Diagnosis not present

## 2021-03-08 DIAGNOSIS — N888 Other specified noninflammatory disorders of cervix uteri: Secondary | ICD-10-CM

## 2021-03-08 DIAGNOSIS — R000 Tachycardia, unspecified: Secondary | ICD-10-CM | POA: Diagnosis not present

## 2021-03-08 DIAGNOSIS — N9489 Other specified conditions associated with female genital organs and menstrual cycle: Secondary | ICD-10-CM | POA: Diagnosis not present

## 2021-03-08 DIAGNOSIS — K429 Umbilical hernia without obstruction or gangrene: Secondary | ICD-10-CM

## 2021-03-08 DIAGNOSIS — J45909 Unspecified asthma, uncomplicated: Secondary | ICD-10-CM | POA: Diagnosis not present

## 2021-03-08 DIAGNOSIS — F17210 Nicotine dependence, cigarettes, uncomplicated: Secondary | ICD-10-CM | POA: Insufficient documentation

## 2021-03-08 DIAGNOSIS — Z91010 Allergy to peanuts: Secondary | ICD-10-CM | POA: Insufficient documentation

## 2021-03-08 DIAGNOSIS — I456 Pre-excitation syndrome: Secondary | ICD-10-CM | POA: Diagnosis not present

## 2021-03-08 DIAGNOSIS — R0602 Shortness of breath: Secondary | ICD-10-CM | POA: Diagnosis not present

## 2021-03-08 DIAGNOSIS — R110 Nausea: Secondary | ICD-10-CM | POA: Diagnosis not present

## 2021-03-08 DIAGNOSIS — E876 Hypokalemia: Secondary | ICD-10-CM | POA: Diagnosis not present

## 2021-03-08 DIAGNOSIS — R002 Palpitations: Secondary | ICD-10-CM | POA: Diagnosis not present

## 2021-03-08 DIAGNOSIS — K760 Fatty (change of) liver, not elsewhere classified: Secondary | ICD-10-CM | POA: Diagnosis not present

## 2021-03-08 DIAGNOSIS — Q516 Embryonic cyst of cervix: Secondary | ICD-10-CM | POA: Insufficient documentation

## 2021-03-08 DIAGNOSIS — R0989 Other specified symptoms and signs involving the circulatory and respiratory systems: Secondary | ICD-10-CM | POA: Diagnosis not present

## 2021-03-08 DIAGNOSIS — R52 Pain, unspecified: Secondary | ICD-10-CM

## 2021-03-08 DIAGNOSIS — R079 Chest pain, unspecified: Secondary | ICD-10-CM | POA: Diagnosis not present

## 2021-03-08 NOTE — ED Provider Notes (Signed)
Emergency Medicine Provider Triage Evaluation Note  Vicki Moore , a 40 y.o. female  was evaluated in triage.  Pt complains of chest pain and SOB.  States hx of WPW and has been having palpitations today as well.  Also reports pain along hernia which has been known for quite some time.  Does not currently have a cardiologist.  Review of Systems  Positive: Chest pain, palpitations Negative: Fever, cough  Physical Exam  BP (!) 152/73 (BP Location: Left Arm)   Pulse (!) 126   Temp 98.1 F (36.7 C) (Oral)   Resp 20   Ht 5\' 1"  (1.549 m)   Wt 81.6 kg   SpO2 99%   BMI 33.99 kg/m   Gen:   Awake, no distress   Resp:  Normal effort  MSK:   Moves extremities without difficulty  Other:    Medical Decision Making  Medically screening exam initiated at 11:55 PM.  Appropriate orders placed.  was informed that the remainder of the evaluation will be completed by another provider, this initial triage assessment does not replace that evaluation, and the importance of remaining in the ED until their evaluation is complete.  Chest pain and palpitations.  Reports hx of WPW but does not currently have cardiologist.  EKG today with WPW.  Labs sent.  Will go back to next available room.   [name], PA-C 03/08/21 2355    03/10/21, MD 03/09/21 [phone]

## 2021-03-08 NOTE — ED Triage Notes (Addendum)
Pt c/o chest pain that started a few hours ago. Pt also reports nausea and vomiting.

## 2021-03-09 ENCOUNTER — Emergency Department (HOSPITAL_COMMUNITY): Payer: Medicaid Other

## 2021-03-09 DIAGNOSIS — R079 Chest pain, unspecified: Secondary | ICD-10-CM | POA: Diagnosis not present

## 2021-03-09 DIAGNOSIS — R0602 Shortness of breath: Secondary | ICD-10-CM | POA: Diagnosis not present

## 2021-03-09 DIAGNOSIS — K760 Fatty (change of) liver, not elsewhere classified: Secondary | ICD-10-CM | POA: Diagnosis not present

## 2021-03-09 LAB — CBC WITH DIFFERENTIAL/PLATELET
Abs Immature Granulocytes: 0.02 10*3/uL (ref 0.00–0.07)
Basophils Absolute: 0 10*3/uL (ref 0.0–0.1)
Basophils Relative: 0 %
Eosinophils Absolute: 0.1 10*3/uL (ref 0.0–0.5)
Eosinophils Relative: 2 %
HCT: 35.3 % — ABNORMAL LOW (ref 36.0–46.0)
Hemoglobin: 10.4 g/dL — ABNORMAL LOW (ref 12.0–15.0)
Immature Granulocytes: 0 %
Lymphocytes Relative: 25 %
Lymphs Abs: 1.9 10*3/uL (ref 0.7–4.0)
MCH: 21.4 pg — ABNORMAL LOW (ref 26.0–34.0)
MCHC: 29.5 g/dL — ABNORMAL LOW (ref 30.0–36.0)
MCV: 72.5 fL — ABNORMAL LOW (ref 80.0–100.0)
Monocytes Absolute: 0.5 10*3/uL (ref 0.1–1.0)
Monocytes Relative: 6 %
Neutro Abs: 5 10*3/uL (ref 1.7–7.7)
Neutrophils Relative %: 67 %
Platelets: 365 10*3/uL (ref 150–400)
RBC: 4.87 MIL/uL (ref 3.87–5.11)
RDW: 18.5 % — ABNORMAL HIGH (ref 11.5–15.5)
WBC: 7.6 10*3/uL (ref 4.0–10.5)
nRBC: 0 % (ref 0.0–0.2)

## 2021-03-09 LAB — BASIC METABOLIC PANEL
Anion gap: 16 — ABNORMAL HIGH (ref 5–15)
BUN: 10 mg/dL (ref 6–20)
CO2: 18 mmol/L — ABNORMAL LOW (ref 22–32)
Calcium: 9.7 mg/dL (ref 8.9–10.3)
Chloride: 101 mmol/L (ref 98–111)
Creatinine, Ser: 0.53 mg/dL (ref 0.44–1.00)
GFR, Estimated: >60 mL/min (ref >60)
Glucose, Bld: 91 mg/dL (ref 70–99)
Potassium: 3.9 mmol/L (ref 3.5–5.1)
Sodium: 135 mmol/L (ref 135–145)

## 2021-03-09 LAB — I-STAT BETA HCG BLOOD, ED (MC, WL, AP ONLY): I-stat hCG, quantitative: <5 m[IU]/mL (ref <5)

## 2021-03-09 LAB — TROPONIN I (HIGH SENSITIVITY): Troponin I (High Sensitivity): 3 ng/L (ref <18)

## 2021-03-09 MED ORDER — ALBUTEROL SULFATE HFA 108 (90 BASE) MCG/ACT IN AERS: 1.0000 | INHALATION_SPRAY | Freq: Four times a day (QID) | RESPIRATORY_TRACT | 0 refills | Status: AC | PRN

## 2021-03-09 MED ORDER — METOPROLOL SUCCINATE ER 25 MG PO TB24: 25.0000 mg | ORAL_TABLET | Freq: Every day | ORAL | 0 refills | Status: DC

## 2021-03-09 MED ORDER — LORAZEPAM 2 MG/ML IJ SOLN
1.0000 mg | Freq: Once | INTRAMUSCULAR | Status: AC
  Administered 2021-03-09: 04:00:00 1 mg via INTRAVENOUS
  Filled 2021-03-09: qty 1

## 2021-03-09 MED ORDER — HYDROMORPHONE HCL 1 MG/ML IJ SOLN
1.0000 mg | Freq: Once | INTRAMUSCULAR | Status: AC
  Administered 2021-03-09: 02:00:00 1 mg via INTRAVENOUS
  Filled 2021-03-09: qty 1

## 2021-03-09 MED ORDER — LIDOCAINE VISCOUS HCL 2 % MT SOLN
15.0000 mL | Freq: Once | OROMUCOSAL | Status: AC
  Administered 2021-03-09: 06:00:00 15 mL via ORAL
  Filled 2021-03-09: qty 15

## 2021-03-09 MED ORDER — IOHEXOL 300 MG/ML  SOLN
100.0000 mL | Freq: Once | INTRAMUSCULAR | Status: AC | PRN
  Administered 2021-03-09: 05:00:00 100 mL via INTRAVENOUS

## 2021-03-09 MED ORDER — FENTANYL CITRATE PF 50 MCG/ML IJ SOSY
100.0000 ug | PREFILLED_SYRINGE | Freq: Once | INTRAMUSCULAR | Status: AC
  Administered 2021-03-09: 06:00:00 100 ug via INTRAVENOUS
  Filled 2021-03-09: qty 2

## 2021-03-09 MED ORDER — LACTATED RINGERS IV BOLUS
1000.0000 mL | Freq: Once | INTRAVENOUS | Status: AC
  Administered 2021-03-09: 02:00:00 1000 mL via INTRAVENOUS

## 2021-03-09 MED ORDER — ONDANSETRON HCL 4 MG/2ML IJ SOLN
4.0000 mg | Freq: Once | INTRAMUSCULAR | Status: AC
  Administered 2021-03-09: 04:00:00 4 mg via INTRAVENOUS
  Filled 2021-03-09: qty 2

## 2021-03-09 MED ORDER — MORPHINE SULFATE (PF) 4 MG/ML IV SOLN
4.0000 mg | Freq: Once | INTRAVENOUS | Status: AC
  Administered 2021-03-09: 02:00:00 4 mg via INTRAMUSCULAR
  Filled 2021-03-09: qty 1

## 2021-03-09 MED ORDER — ALUM & MAG HYDROXIDE-SIMETH 200-200-20 MG/5ML PO SUSP
30.0000 mL | Freq: Once | ORAL | Status: AC
  Administered 2021-03-09: 06:00:00 30 mL via ORAL
  Filled 2021-03-09: qty 30

## 2021-03-09 MED ORDER — ONDANSETRON HCL 4 MG/2ML IJ SOLN
4.0000 mg | Freq: Once | INTRAMUSCULAR | Status: AC
  Administered 2021-03-09: 02:00:00 4 mg via INTRAVENOUS
  Filled 2021-03-09: qty 2

## 2021-03-09 MED ORDER — ONDANSETRON 4 MG PO TBDP
4.0000 mg | ORAL_TABLET | Freq: Once | ORAL | Status: AC
  Administered 2021-03-09: 01:00:00 4 mg via ORAL
  Filled 2021-03-09: qty 1

## 2021-03-09 MED ORDER — FENTANYL CITRATE PF 50 MCG/ML IJ SOSY
50.0000 ug | PREFILLED_SYRINGE | Freq: Once | INTRAMUSCULAR | Status: AC
  Administered 2021-03-09: 01:00:00 50 ug via INTRAMUSCULAR
  Filled 2021-03-09: qty 1

## 2021-03-09 NOTE — ED Notes (Signed)
Patient transported to CT 

## 2021-03-09 NOTE — ED Provider Notes (Signed)
[facility] EMERGENCY DEPARTMENT Provider Note   CSN: [id number] Arrival date & time: 03/08/21  2328     History Chief Complaint  Patient presents with   Chest Pain    ZAMYIAH TINO is a 40 y.o. female.  The history is provided by the patient.  Abdominal Pain Pain location: Hernia. Pain radiates to:  Does not radiate Pain severity:  Severe Onset quality:  Sudden Duration:  1 day Timing:  Constant Progression:  Worsening Chronicity:  Recurrent Relieved by:  Nothing Worsened by:  Palpation and movement Associated symptoms: chest pain, nausea and vomiting   Associated symptoms: no constipation, no diarrhea, no fever and no hematemesis   Patient with history of WPW, anxiety presents with abdominal pain.  Patient reports long history of umbilical hernia, it is worse over the past day.  She reports multiple episodes of nonbloody emesis.  She also reports history of chest pain and palpitations similar to prior episodes of WPW. No syncope She reports she has not had any sort of ablation or treatment for her WPW    Past Medical History:  Diagnosis Date   Anxiety    Asthma    Asthma    Depression    GERD (gastroesophageal reflux disease)     Patient Active Problem List   Diagnosis Date Noted   WPW (Wolff-Parkinson-White syndrome) 11/24/2020   Hiatal hernia    Non-intractable vomiting    Acute asthma exacerbation 11/20/2020   Acute hypoxemic respiratory failure (HCC) 11/20/2020   Abdominal pain 11/20/2020   SVT (supraventricular tachycardia) (HCC) 11/20/2020   Lactic acidosis 11/20/2020   Acute respiratory failure with hypoxia (HCC) 11/10/2020   Elevated LFTs 11/10/2020   Opioid abuse (HCC) 11/10/2020   Chronic prescription opiate use 08/04/2020   Benzodiazepine withdrawal (HCC) 08/03/2020   Chronic pain 08/03/2020   Hypokalemia 08/03/2020   Alcohol abuse 08/03/2020   Tobacco abuse 08/03/2020   Anxiety disorder    Nephrolithiasis 01/10/2020    Biliary colic    Right upper quadrant pain 11/08/2019   RUQ pain 11/07/2019   GERD (gastroesophageal reflux disease) 11/07/2019   Bilateral foot pain 12/08/2017   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   Hypoxemia 08/08/2017   Unspecified asthma with (acute) exacerbation 08/08/2017   Influenza A with pneumonia 04/29/2017   Xanax use disorder, 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   Drug overdose 04/23/2017   Polysubstance abuse (HCC) 04/23/2017   Anxiety state 03/22/2009   Asthma 01/02/2009   LOW BACK PAIN 01/02/2009    Past Surgical History:  Procedure Laterality Date   BIOPSY  11/23/2020   Procedure: BIOPSY;  Surgeon: Shellia Cleverly, DO;  Location: [facility];  Service: Gastroenterology;;   CHOLECYSTECTOMY N/A 12/08/2019   Procedure: LAPAROSCOPIC CHOLECYSTECTOMY;  Surgeon: Franky Macho, MD;  Location: [facility];  Service: General;  Laterality: N/A;   ESOPHAGOGASTRODUODENOSCOPY (EGD) WITH PROPOFOL N/A 11/23/2020   Procedure: ESOPHAGOGASTRODUODENOSCOPY (EGD) WITH PROPOFOL;  Surgeon: Shellia Cleverly, DO;  Location: [facility];  Service: Gastroenterology;  Laterality: N/A;     OB History   No obstetric history on file.     No family history on file.  Social History   Tobacco Use   Smoking status: Some Days    Packs/day: 0.25    Years: 10.00    Pack years: 2.50    Types: Cigarettes   Smokeless tobacco: Never  Vaping Use   Vaping Use: Never used  Substance Use Topics   Alcohol use: Yes    Comment: rare  Drug use: Not Currently    Home Medications Prior to Admission medications   Medication Sig Start Date End Date Taking? Authorizing Provider  acetaminophen (TYLENOL) 325 MG tablet Take 2 tablets (650 mg total) by mouth every 6 (six) hours as needed. 11/19/20   Sloan Leiter, DO  acidophilus (RISAQUAD) CAPS capsule Take 1 capsule by mouth daily. 11/28/20   Pokhrel, Rebekah Chesterfield, MD  Calcium Carbonate Antacid (TUMS PO) Take 1-2 tablets by mouth every hour as needed (acid  reflux/heartburn).    [provider]  dicyclomine (BENTYL) 20 MG tablet Take 1 tablet (20 mg total) by mouth 2 (two) times daily. 11/19/20   Sloan Leiter, DO  fluticasone-salmeterol (ADVAIR DISKUS) 500-50 MCG/ACT AEPB Inhale 1 puff into the lungs 2 (two) times daily. 11/28/20   Pokhrel, Rebekah Chesterfield, MD  ibuprofen (ADVIL) 600 MG tablet Take 1 tablet (600 mg total) by mouth every 8 (eight) hours as needed. 12/13/20   Eleonore Chiquito, FNP  ipratropium-albuterol (DUONEB) 0.5-2.5 (3) MG/3ML SOLN Take 3 mLs by nebulization 3 (three) times daily as needed for wheezing or shortness of breath. 10/11/19   [provider]  levalbuterol Pauline Aus HFA) 45 MCG/ACT inhaler Inhale 2 puffs into the lungs every 4 (four) hours as needed for wheezing. 11/28/20 11/28/21  Pokhrel, Rebekah Chesterfield, MD  metoprolol succinate (TOPROL-XL) 25 MG 24 hr tablet Take 1 tablet (25 mg total) by mouth at bedtime. 11/28/20   Pokhrel, Rebekah Chesterfield, MD  nicotine (NICODERM CQ - DOSED IN MG/24 HOURS) 14 mg/24hr patch Place 1 patch (14 mg total) onto the skin daily. 08/07/20   Pokhrel, Rebekah Chesterfield, MD  pantoprazole (PROTONIX) 40 MG tablet Take 1 tablet (40 mg total) by mouth 2 (two) times daily before a meal. 11/15/19   Amin, Loura Halt, MD  potassium chloride SA (KLOR-CON) 20 MEQ tablet Take 1 tablet (20 mEq total) by mouth daily for 10 days. 11/28/20 12/08/20  Pokhrel, Rebekah Chesterfield, MD  promethazine (PHENERGAN) 12.5 MG tablet Take 1 tablet (12.5 mg total) by mouth every 6 (six) hours as needed for nausea or vomiting. 11/28/20   Pokhrel, Rebekah Chesterfield, MD  tetrahydrozoline-zinc (VISINE-AC) 0.05-0.25 % ophthalmic solution Place 1 drop into both eyes 3 (three) times daily as needed (dry eyes).    [provider]  valACYclovir (VALTREX) 1000 MG tablet Take 1,000 mg by mouth 2 (two) times daily as needed (breakouts). 02/09/20   [provider]    Allergies    Peanut-containing drug products and Penicillins  Review of Systems   Review of Systems   Constitutional:  Negative for fever.  Cardiovascular:  Positive for chest pain.  Gastrointestinal:  Positive for abdominal pain, nausea and vomiting. Negative for constipation, diarrhea and hematemesis.  Neurological:  Negative for syncope.  All other systems reviewed and are negative.  Physical Exam Updated Vital Signs BP (!) 139/46   Pulse 93   Temp 98.1 F (36.7 C) (Oral)   Resp 14   Ht 1.549 m (5\' 1" )   Wt 81.6 kg   SpO2 100%   BMI 33.99 kg/m   Physical Exam CONSTITUTIONAL: Anxious, screaming out in pain HEAD: Normocephalic/atraumatic EYES: EOMI/PERRL ENMT: Mucous membranes dry NECK: supple no meningeal signs SPINE/BACK:entire spine nontender CV: S1/S2 noted, no murmurs/rubs/gallops noted LUNGS: Lungs are clear to auscultation bilaterally, no apparent distress ABDOMEN: soft, mild tenderness over umbilicus but no discoloration, but hernia appears reducible, no rebound or guarding, bowel sounds noted throughout abdomen GU:no cva tenderness NEURO: Pt is awake/alert/appropriate, moves all extremitiesx4.  No facial droop.  EXTREMITIES: pulses normal/equal, full ROM SKIN: warm, color normal PSYCH: Anxious  ED Results / Procedures / Treatments   Labs (all labs ordered are listed, but only abnormal results are displayed) Labs Reviewed  CBC WITH DIFFERENTIAL/PLATELET - Abnormal; Notable for the following components:      Result Value   Hemoglobin 10.4 (*)    HCT 35.3 (*)    MCV 72.5 (*)    MCH 21.4 (*)    MCHC 29.5 (*)    RDW 18.5 (*)    All other components within normal limits  BASIC METABOLIC PANEL - Abnormal; Notable for the following components:   CO2 18 (*)    Anion gap 16 (*)    All other components within normal limits  I-STAT BETA HCG BLOOD, ED ([facility], [facility], [facility] ONLY)  TROPONIN I (HIGH SENSITIVITY)    EKG EKG Interpretation  Date/Time:  Saturday March 08 2021 23:38:38 EST Ventricular Rate:  105 PR Interval:  112 QRS Duration: 106 QT  Interval:  372 QTC Calculation: 491 R Axis:   4 Text Interpretation: Sinus tachycardia Wolff-Parkinson-White Abnormal ECG similar to prior Confirmed by Zadie Rhine ([id number]) on 03/09/2021 1:02:18 AM  Radiology DG Chest 1 View  Result Date: 03/09/2021 CLINICAL DATA:  Chest pain and shortness of breath. EXAM: CHEST  1 VIEW COMPARISON:  December 06, 2020 FINDINGS: The heart size and mediastinal contours are within normal limits. Both lungs are clear. Radiopaque surgical clips are seen within the right upper quadrant. The visualized skeletal structures are unremarkable. IMPRESSION: No active disease. Electronically Signed   By: Aram Candela M.D.   On: 03/09/2021 00:35   CT ABDOMEN PELVIS W CONTRAST  Result Date: 03/09/2021 CLINICAL DATA:  40 year old female with history of nonlocalized abdominal pain, nausea and vomiting. EXAM: CT ABDOMEN AND PELVIS WITH CONTRAST TECHNIQUE: Multidetector CT imaging of the abdomen and pelvis was performed using the standard protocol following bolus administration of intravenous contrast. CONTRAST:  OMNIPAQUE IOHEXOL 300 MG/ML  SOLN COMPARISON:  CT the abdomen and pelvis 02/14/2021. FINDINGS: Lower chest: Mild scarring in the inferior segment of the lingula. Small hiatal hernia. Hepatobiliary: Diffuse low attenuation throughout the hepatic parenchyma indicative of hepatic steatosis. No suspicious cystic or solid hepatic lesions are noted. No intra or extrahepatic biliary ductal dilatation. Status post cholecystectomy. Pancreas: No pancreatic mass. No pancreatic ductal dilatation. No pancreatic or peripancreatic fluid collections or inflammatory changes. Spleen: Unremarkable. Adrenals/Urinary Tract: 1.6 cm intermediate attenuation (39 HU) lesion in the medial aspect of the left kidney, incompletely characterize, but similar to remote prior study from 2011, presumably a benign mildly proteinaceous cyst. Right kidney and bilateral adrenal glands are normal in  appearance. No hydroureteronephrosis. Urinary bladder is normal in appearance. Stomach/Bowel: The appearance of the stomach is normal. There is no pathologic dilatation of small bowel or colon. A few scattered colonic diverticulae are noted, without surrounding inflammatory changes to suggest an acute diverticulitis at this time. Normal appendix. Vascular/Lymphatic: No significant atherosclerotic disease, aneurysm or dissection noted in the abdominal or pelvic vasculature. No lymphadenopathy noted in the abdomen or pelvis. Reproductive: Numerous low-attenuation lesions are noted in the region of the cervix, similar to prior examinations, incompletely characterized on today's CT examination, but likely representative of multifocal Nabothian cysts. Ovaries are unremarkable in appearance. Other: Moderate-sized fat containing umbilical hernia. No significant volume of ascites. No pneumoperitoneum. Musculoskeletal: There are no aggressive appearing lytic or blastic lesions noted in the visualized portions of the skeleton. IMPRESSION: 1. No acute findings are noted  in the abdomen or pelvis to account for the patient's symptoms. 2. Moderate-sized fat containing umbilical hernia without evidence of bowel incarceration or obstruction at this time. 3. Multiple small cystic appearing areas in the region of the cervix, likely representative of multiple Nabothian cysts. Follow-up nonemergent outpatient pelvic ultrasound is recommended for further evaluation in the near future. 4. Small hiatal hernia. 5. Mild hepatic steatosis. 6. Mild colonic diverticulosis without evidence of acute diverticulitis at this time. 7. Additional incidental findings, as above. Electronically Signed   By: Trudie Reed M.D.   On: 03/09/2021 05:45    Procedures Procedures   Medications Ordered in ED Medications  ondansetron (ZOFRAN-ODT) disintegrating tablet 4 mg (4 mg Oral Given 03/09/21 0052)  fentaNYL (SUBLIMAZE) injection 50 mcg (50 mcg  Intramuscular Given 03/09/21 0108)  morphine 4 MG/ML injection 4 mg (4 mg Intramuscular Given 03/09/21 0147)  lactated ringers bolus 1,000 mL (0 mLs Intravenous Stopped 03/09/21 0432)  HYDROmorphone (DILAUDID) injection 1 mg (1 mg Intravenous Given 03/09/21 0220)  ondansetron (ZOFRAN) injection 4 mg (4 mg Intravenous Given 03/09/21 0220)  LORazepam (ATIVAN) injection 1 mg (1 mg Intravenous Given 03/09/21 0332)  ondansetron (ZOFRAN) injection 4 mg (4 mg Intravenous Given 03/09/21 0334)  fentaNYL (SUBLIMAZE) injection 100 mcg (100 mcg Intravenous Given 03/09/21 0555)  iohexol (OMNIPAQUE) 300 MG/ML solution 100 mL (100 mLs Intravenous Contrast Given 03/09/21 0522)  alum & mag hydroxide-simeth (MAALOX/MYLANTA) 200-200-20 MG/5ML suspension 30 mL (30 mLs Oral Given 03/09/21 0557)    And  lidocaine (XYLOCAINE) 2 % viscous mouth solution 15 mL (15 mLs Oral Given 03/09/21 0557)    ED Course  I have reviewed the triage vital signs and the nursing notes.  Pertinent labs  results that were available during my care of the patient were reviewed by me and considered in my medical decision making (see chart for details).    MDM Rules/Calculators/A&P                           Patient presents with abdominal pain and vomiting.  She reports she has had normal bowel movements.  She reports this pain is similar to previous episodes with her umbilical hernia.  Imaging previously has revealed a fat-containing hernia without incarceration. Other than mild tenderness, her abdominal exam is unremarkable, she has pain out of proportion to exam  Patient is dry heaving and vomiting all over the floor demanding more pain medicine. On my history, she only mention chest pain and palpitations more of a chronic issue rather than what she told initial provider. She reports her WPW is treated with metoprolol and has not had an intervention as of yet. We will treat pain, give fluids and reassess.  Will defer imaging for  now 3:19 AM Patient is worsening, writhing around in the bed, still reporting intense abdominal pain. Proceed with CT imaging. 7:02 AM CT does not reveal any acute findings to explain her pain.  Patient has umbilical hernia with fat only, without obstruction Patient also has nabothian cyst and will need outpatient pelvic ultrasound Patient requesting refill of her metoprolol for WPW as well as albuterol She will also be given referral to cardiology. Referral to general surgery for her hernia.  [facility] We will also consult social work to help patient with medications. Overall patient is improving, no further vomiting, and pain is improving She is safe for discharge home  Final Clinical Impression(s) /  ED Diagnoses Final diagnoses:  WPW (Wolff-Parkinson-White syndrome)  Umbilical hernia without obstruction and without gangrene  Cervical cyst  Nabothian cyst    Rx / DC Orders ED Discharge Orders          Ordered    metoprolol succinate (TOPROL-XL) 25 MG 24 hr tablet  Daily        03/09/21 0659    albuterol (VENTOLIN HFA) 108 (90 Base) MCG/ACT inhaler  Every 6 hours PRN        03/09/21 0659             Zadie Rhine, MD 03/09/21 [phone]

## 2021-03-09 NOTE — ED Notes (Addendum)
This RN entered pt's room to find pt actively dry heaving and vomiting onto the floor. Spit bags provided to pt. Pt stated to this RN that she believes her nausea is caused by her intense hernia pain. Plan to inform ED provider and request antiemetic & analgesic.

## 2021-03-09 NOTE — ED Notes (Signed)
IV team at pt bedside 

## 2021-03-09 NOTE — Discharge Instructions (Addendum)
Your prescriptions have been sent to the pharmacy.  Please follow-up with the cardiologist listed. Please follow-up as an outpatient with gynecologist listed to have an ultrasound of your uterus and ovaries.

## 2021-03-09 NOTE — ED Notes (Signed)
This RN explained to pt that it is advised she do not consume ice chips or drink fluids at this time due to her continuous nausea/emesis & hospital protocol of remaining NPO until her ordered CT is completed and resulted.

## 2021-03-26 DIAGNOSIS — R000 Tachycardia, unspecified: Secondary | ICD-10-CM | POA: Diagnosis not present

## 2021-04-08 ENCOUNTER — Encounter: Payer: Self-pay | Admitting: Internal Medicine

## 2021-04-08 ENCOUNTER — Encounter (HOSPITAL_COMMUNITY): Payer: Self-pay | Admitting: Radiology

## 2021-06-24 ENCOUNTER — Telehealth: Payer: Self-pay | Admitting: Internal Medicine

## 2021-06-24 NOTE — Telephone Encounter (Signed)
New message ? ? ? ?*STAT* If patient is at the pharmacy, call can be transferred to refill team. ? ? ?1. Which medications need to be refilled? (please list name of each medication and dose if known) metoprolol 25 mg ? ?2. Which pharmacy/location (including street and city if local pharmacy) is medication to be sent to?[facility] on [address] ? ?3. Do they need a 30 day or 90 day supply? 30 ? ?Pt states that [facility] prescribed this in the hospital and she has one pill left and needs a refill. Pt is scheduled for an appt on 07/18/21. Pt would like to know if she can have a refill until her appt. ? ?

## 2021-06-25 ENCOUNTER — Other Ambulatory Visit: Payer: Self-pay | Admitting: *Deleted

## 2021-06-25 MED ORDER — METOPROLOL SUCCINATE ER 25 MG PO TB24: 25.0000 mg | ORAL_TABLET | Freq: Every day | ORAL | 0 refills | Status: DC

## 2021-06-25 NOTE — Telephone Encounter (Signed)
Refilled pt's metoprolol # 30 days under [facility] to requested pharmacy.  ?

## 2021-07-18 ENCOUNTER — Ambulatory Visit (INDEPENDENT_AMBULATORY_CARE_PROVIDER_SITE_OTHER): Payer: Medicaid Other | Admitting: Internal Medicine

## 2021-07-18 ENCOUNTER — Encounter: Payer: Self-pay | Admitting: Internal Medicine

## 2021-07-18 VITALS — BP 110/74 | HR 109 | Ht 61.0 in | Wt 180.8 lb

## 2021-07-18 DIAGNOSIS — I456 Pre-excitation syndrome: Secondary | ICD-10-CM | POA: Diagnosis not present

## 2021-07-18 MED ORDER — METOPROLOL SUCCINATE ER 25 MG PO TB24: 25.0000 mg | ORAL_TABLET | Freq: Every day | ORAL | 3 refills | Status: AC

## 2021-07-18 NOTE — Patient Instructions (Addendum)
Medication Instructions:  ?Your physician recommends that you continue on your current medications as directed. Please refer to the Current Medication list given to you today. ? ?Labwork: ?None ordered. ? ?Testing/Procedures: ?None ordered. ? ?Follow-Up: ? ?The following dates are available for this procedure: ? ?May 9, 11, 18, 23, 25, 30 ? ?Any Other Special Instructions Will Be Listed Below (If Applicable). ? ?If you need a refill on your cardiac medications before your next appointment, please call your pharmacy.  ? ?Cardiac Ablation ?Cardiac ablation is a procedure to destroy, or ablate, a small amount of heart tissue in very specific places. The heart has many electrical connections. Sometimes these connections are abnormal and can cause the heart to beat very fast or irregularly. Ablating some of the areas that cause problems can improve the heart's rhythm or return it to normal. Ablation may be done for people who: ?Have Wolff-Parkinson-White syndrome. ?Have fast heart rhythms (tachycardia). ?Have taken medicines for an abnormal heart rhythm (arrhythmia) that were not effective or caused side effects. ?Have a high-risk heartbeat that may be life-threatening. ?During the procedure, a small incision is made in the neck or the groin, and a long, thin tube (catheter) is inserted into the incision and moved to the heart. Small devices (electrodes) on the tip of the catheter will send out electrical currents. A type of X-ray (fluoroscopy) will be used to help guide the catheter and to provide images of the heart. ?Tell a health care provider about: ?Any allergies you have. ?All medicines you are taking, including vitamins, herbs, eye drops, creams, and over-the-counter medicines. ?Any problems you or family members have had with anesthetic medicines. ?Any blood disorders you have. ?Any surgeries you have had. ?Any medical conditions you have, such as kidney failure. ?Whether you are pregnant or may be  pregnant. ?What are the risks? ?Generally, this is a safe procedure. However, problems may occur, including: ?Infection. ?Bruising and bleeding at the catheter insertion site. ?Bleeding into the chest, especially into the sac that surrounds the heart. This is a serious complication. ?Stroke or blood clots. ?Damage to nearby structures or organs. ?Allergic reaction to medicines or dyes. ?Need for a permanent pacemaker if the normal electrical system is damaged. A pacemaker is a small computer that sends electrical signals to the heart and helps your heart beat normally. ?The procedure not being fully effective. This may not be recognized until months later. Repeat ablation procedures are sometimes done. ?What happens before the procedure? ?Medicines ?Ask your health care provider about: ?Changing or stopping your regular medicines. This is especially important if you are taking diabetes medicines or blood thinners. ?Taking medicines such as aspirin and ibuprofen. These medicines can thin your blood. Do not take these medicines unless your health care provider tells you to take them. ?Taking over-the-counter medicines, vitamins, herbs, and supplements. ?General instructions ?Follow instructions from your health care provider about eating or drinking restrictions. ?Plan to have someone take you home from the hospital or clinic. ?If you will be going home right after the procedure, plan to have someone with you for 24 hours. ?Ask your health care provider what steps will be taken to prevent infection. ?What happens during the procedure? ? ?An IV will be inserted into one of your veins. ?You will be given a medicine to help you relax (sedative). ?The skin on your neck or groin will be numbed. ?An incision will be made in your neck or your groin. ?A needle will be inserted through the incision  and into a large vein in your neck or groin. ?A catheter will be inserted into the needle and moved to your heart. ?Dye may be  injected through the catheter to help your surgeon see the area of the heart that needs treatment. ?Electrical currents will be sent from the catheter to ablate heart tissue in desired areas. There are three types of energy that may be used to do this: ?Heat (radiofrequency energy). ?Laser energy. ?Extreme cold (cryoablation). ?When the tissue has been ablated, the catheter will be removed. ?Pressure will be held on the insertion area to prevent a lot of bleeding. ?A bandage (dressing) will be placed over the insertion area. ?The exact procedure may vary among health care providers and hospitals. ?What happens after the procedure? ?Your blood pressure, heart rate, breathing rate, and blood oxygen level will be monitored until you leave the hospital or clinic. ?Your insertion area will be monitored for bleeding. You will need to lie still for a few hours to ensure that you do not bleed from the insertion area. ?Do not drive for 24 hours or as long as told by your health care provider. ?Summary ?Cardiac ablation is a procedure to destroy, or ablate, a small amount of heart tissue using an electrical current. This procedure can improve the heart rhythm or return it to normal. ?Tell your health care provider about any medical conditions you may have and all medicines you are taking to treat them. ?This is a safe procedure, but problems may occur. Problems may include infection, bruising, damage to nearby organs or structures, or allergic reactions to medicines. ?Follow your health care provider's instructions about eating and drinking before the procedure. You may also be told to change or stop some of your medicines. ?After the procedure, do not drive for 24 hours or as long as told by your health care provider. ?This information is not intended to replace advice given to you by your health care provider. Make sure you discuss any questions you have with your health care provider. ?Document Revised: 02/06/2019 Document  Reviewed: 02/06/2019 ?Elsevier Patient Education ? 2022 [place]. ? ? ? ?

## 2021-07-18 NOTE — Progress Notes (Signed)
? ? ? ? ?HPI ?Ms. 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 She is a pleasant 41 yo woman with multiple medical problems who has had recurrent tachy-palpitations and manifest WPW. She has never had syncope though she did have an unintentional opiod OD requiring narcan. She has asthma. She is quite anxious.  ?Allergies  ?Allergen Reactions  ? Peanut-Containing Drug Products Hives and Shortness Of Breath  ? Penicillins Anaphylaxis, Hives, Nausea And Vomiting and Rash  ?   ? ?  ? ? ? ?Current Outpatient Medications  ?Medication Sig Dispense Refill  ? acetaminophen (TYLENOL) 325 MG tablet Take 2 tablets (650 mg total) by mouth every 6 (six) hours as needed. 30 tablet 0  ? acidophilus (RISAQUAD) CAPS capsule Take 1 capsule by mouth daily. 60 capsule 0  ? albuterol (VENTOLIN HFA) 108 (90 Base) MCG/ACT inhaler Inhale 1-2 puffs into the lungs every 6 (six) hours as needed for wheezing or shortness of breath. 1 each 0  ? Calcium Carbonate Antacid (TUMS PO) Take 1-2 tablets by mouth every hour as needed (acid reflux/heartburn).    ? dicyclomine (BENTYL) 20 MG tablet Take 1 tablet (20 mg total) by mouth 2 (two) times daily. 20 tablet 0  ? fluticasone-salmeterol (ADVAIR DISKUS) 500-50 MCG/ACT AEPB Inhale 1 puff into the lungs 2 (two) times daily. 42 each 2  ? ibuprofen (ADVIL) 600 MG tablet Take 1 tablet (600 mg total) by mouth every 8 (eight) hours as needed. 30 tablet 0  ? ipratropium-albuterol (DUONEB) 0.5-2.5 (3) MG/3ML SOLN Take 3 mLs by nebulization 3 (three) times daily as needed for wheezing or shortness of breath.    ? levalbuterol (XOPENEX HFA) 45 MCG/ACT inhaler Inhale 2 puffs into the lungs every 4 (four) hours as needed for wheezing. 1 each 2  ? metoprolol succinate (TOPROL-XL) 25 MG 24 hr tablet Take 1 tablet (25 mg total) by mouth daily. 30 tablet 0  ? nicotine (NICODERM CQ - DOSED IN MG/24 HOURS) 14 mg/24hr patch Place 1 patch (14 mg total) onto the skin daily. 28 patch  0  ? pantoprazole (PROTONIX) 40 MG tablet Take 1 tablet (40 mg total) by mouth 2 (two) times daily before a meal. 60 tablet 0  ? promethazine (PHENERGAN) 12.5 MG tablet Take 1 tablet (12.5 mg total) by mouth every 6 (six) hours as needed for nausea or vomiting. 30 tablet 0  ? tetrahydrozoline-zinc (VISINE-AC) 0.05-0.25 % ophthalmic solution Place 1 drop into both eyes 3 (three) times daily as needed (dry eyes).    ? valACYclovir (VALTREX) 1000 MG tablet Take 1,000 mg by mouth 2 (two) times daily as needed (breakouts).    ? potassium chloride SA (KLOR-CON) 20 MEQ tablet Take 1 tablet (20 mEq total) by mouth daily for 10 days. 10 tablet 0  ? ?No current facility-administered medications for this visit.  ? ? ? ?Past Medical History:  ?Diagnosis Date  ? Anxiety   ? Asthma   ? Asthma   ? Depression   ? GERD (gastroesophageal reflux disease)   ? ? ?ROS: ? ? All systems reviewed and negative except as noted in the HPI. ? ? ?Past Surgical History:  ?Procedure Laterality Date  ? BIOPSY  11/23/2020  ? Procedure: BIOPSY;  Surgeon: Shellia Cleverly, DO;  Location: [facility];  Service: Gastroenterology;;  ? CHOLECYSTECTOMY N/A 12/08/2019  ? Procedure: LAPAROSCOPIC CHOLECYSTECTOMY;  Surgeon: Franky Macho, MD;  Location: [facility];  Service: General;  Laterality: N/A;  ? ESOPHAGOGASTRODUODENOSCOPY (  EGD) WITH PROPOFOL N/A 11/23/2020  ? Procedure: ESOPHAGOGASTRODUODENOSCOPY (EGD) WITH PROPOFOL;  Surgeon: Shellia Cleverly, DO;  Location: [facility];  Service: Gastroenterology;  Laterality: N/A;  ? ? ? ?History reviewed. No pertinent family history. ? ? ?Social History  ? ?Socioeconomic History  ? Marital status: Single  ?  Spouse name: Not on file  ? Number of children: 1  ? Years of education: Not on file  ? Highest education level: Not on file  ?Occupational History  ? Not on file  ?Tobacco Use  ? Smoking status: Some Days  ?  Packs/day: 0.25  ?  Years: 10.00  ?  Pack years: 2.50  ?  Types: Cigarettes  ? Smokeless tobacco:  Never  ?Vaping Use  ? Vaping Use: Never used  ?Substance and Sexual Activity  ? Alcohol use: Yes  ?  Comment: rare  ? Drug use: Not Currently  ? Sexual activity: Not on file  ?Other Topics Concern  ? Not on file  ?Social History Narrative  ? Not on file  ? ?Social Determinants of Health  ? ?Financial Resource Strain: Not on file  ?Food Insecurity: Not on file  ?Transportation Needs: Not on file  ?Physical Activity: Not on file  ?Stress: Not on file  ?Social Connections: Not on file  ?Intimate Partner Violence: Not on file  ? ? ? ?BP 110/74   Pulse (!) 109   Ht 5\' 1"  (1.549 m)   Wt 180 lb 12.8 oz (82 kg)   SpO2 95%   BMI 34.16 kg/m?  ? ?Physical Exam: ? ?Well appearing NAD ?HEENT: Unremarkable ?Neck:  No JVD, no thyromegally ?Lymphatics:  No adenopathy ?Back:  No CVA tenderness ?Lungs:  Clear with minimal wheezes ?HEART:  Regular rate rhythm, no murmurs, no rubs, no clicks ?Abd:  soft, positive bowel sounds, no organomegally, no rebound, no guarding ?Ext:  2 plus pulses, no edema, no cyanosis, no clubbing ?Skin:  No rashes no nodules ?Neuro:  CN II through XII intact, motor grossly intact ? ?EKG - nsr with WPW. ? ?Assess/Plan:  ?WPW - she is symptomatic. She was shocked for SVT at 190. I have discussed the treatment options with the patient and recommend proceeding with catheter ablation. I did explain that her AP could be very close to her AV node and PPM insertion would be a possibility.  ? Marcelo Ickes,MD ?

## 2021-08-12 DIAGNOSIS — E782 Mixed hyperlipidemia: Secondary | ICD-10-CM | POA: Diagnosis not present

## 2021-08-12 DIAGNOSIS — R5383 Other fatigue: Secondary | ICD-10-CM | POA: Diagnosis not present

## 2021-08-12 DIAGNOSIS — I10 Essential (primary) hypertension: Secondary | ICD-10-CM | POA: Diagnosis not present

## 2021-08-13 DIAGNOSIS — F419 Anxiety disorder, unspecified: Secondary | ICD-10-CM | POA: Diagnosis not present

## 2021-08-13 DIAGNOSIS — G4700 Insomnia, unspecified: Secondary | ICD-10-CM | POA: Diagnosis not present

## 2021-08-13 DIAGNOSIS — J449 Chronic obstructive pulmonary disease, unspecified: Secondary | ICD-10-CM | POA: Diagnosis not present

## 2021-08-13 DIAGNOSIS — M545 Low back pain, unspecified: Secondary | ICD-10-CM | POA: Diagnosis not present

## 2021-08-16 ENCOUNTER — Inpatient Hospital Stay (HOSPITAL_COMMUNITY)
Admission: EM | Admit: 2021-08-16 | Discharge: 2021-09-11 | DRG: 917 | Disposition: E | Payer: Medicaid Other | Attending: Internal Medicine | Admitting: Internal Medicine

## 2021-08-16 ENCOUNTER — Emergency Department (HOSPITAL_COMMUNITY): Payer: Medicaid Other

## 2021-08-16 ENCOUNTER — Inpatient Hospital Stay (HOSPITAL_COMMUNITY): Payer: Medicaid Other

## 2021-08-16 DIAGNOSIS — I952 Hypotension due to drugs: Secondary | ICD-10-CM | POA: Diagnosis not present

## 2021-08-16 DIAGNOSIS — Z515 Encounter for palliative care: Secondary | ICD-10-CM

## 2021-08-16 DIAGNOSIS — F17210 Nicotine dependence, cigarettes, uncomplicated: Secondary | ICD-10-CM | POA: Diagnosis present

## 2021-08-16 DIAGNOSIS — D62 Acute posthemorrhagic anemia: Secondary | ICD-10-CM | POA: Diagnosis not present

## 2021-08-16 DIAGNOSIS — R739 Hyperglycemia, unspecified: Secondary | ICD-10-CM | POA: Diagnosis not present

## 2021-08-16 DIAGNOSIS — G253 Myoclonus: Secondary | ICD-10-CM | POA: Diagnosis not present

## 2021-08-16 DIAGNOSIS — J189 Pneumonia, unspecified organism: Secondary | ICD-10-CM | POA: Diagnosis not present

## 2021-08-16 DIAGNOSIS — R578 Other shock: Secondary | ICD-10-CM | POA: Diagnosis not present

## 2021-08-16 DIAGNOSIS — I469 Cardiac arrest, cause unspecified: Secondary | ICD-10-CM | POA: Diagnosis present

## 2021-08-16 DIAGNOSIS — T4275XA Adverse effect of unspecified antiepileptic and sedative-hypnotic drugs, initial encounter: Secondary | ICD-10-CM | POA: Diagnosis not present

## 2021-08-16 DIAGNOSIS — R0603 Acute respiratory distress: Secondary | ICD-10-CM | POA: Diagnosis not present

## 2021-08-16 DIAGNOSIS — Z91010 Allergy to peanuts: Secondary | ICD-10-CM

## 2021-08-16 DIAGNOSIS — R680 Hypothermia, not associated with low environmental temperature: Secondary | ICD-10-CM | POA: Diagnosis present

## 2021-08-16 DIAGNOSIS — I456 Pre-excitation syndrome: Secondary | ICD-10-CM | POA: Diagnosis present

## 2021-08-16 DIAGNOSIS — Z79899 Other long term (current) drug therapy: Secondary | ICD-10-CM

## 2021-08-16 DIAGNOSIS — R092 Respiratory arrest: Secondary | ICD-10-CM | POA: Diagnosis not present

## 2021-08-16 DIAGNOSIS — Z880 Allergy status to penicillin: Secondary | ICD-10-CM

## 2021-08-16 DIAGNOSIS — Z66 Do not resuscitate: Secondary | ICD-10-CM | POA: Diagnosis not present

## 2021-08-16 DIAGNOSIS — J45909 Unspecified asthma, uncomplicated: Secondary | ICD-10-CM | POA: Diagnosis present

## 2021-08-16 DIAGNOSIS — J9601 Acute respiratory failure with hypoxia: Secondary | ICD-10-CM | POA: Diagnosis present

## 2021-08-16 DIAGNOSIS — Z4682 Encounter for fitting and adjustment of non-vascular catheter: Secondary | ICD-10-CM | POA: Diagnosis not present

## 2021-08-16 DIAGNOSIS — E440 Moderate protein-calorie malnutrition: Secondary | ICD-10-CM | POA: Diagnosis present

## 2021-08-16 DIAGNOSIS — K219 Gastro-esophageal reflux disease without esophagitis: Secondary | ICD-10-CM | POA: Diagnosis present

## 2021-08-16 DIAGNOSIS — S36119A Unspecified injury of liver, initial encounter: Secondary | ICD-10-CM | POA: Diagnosis not present

## 2021-08-16 DIAGNOSIS — E162 Hypoglycemia, unspecified: Secondary | ICD-10-CM | POA: Diagnosis not present

## 2021-08-16 DIAGNOSIS — J690 Pneumonitis due to inhalation of food and vomit: Secondary | ICD-10-CM | POA: Diagnosis present

## 2021-08-16 DIAGNOSIS — E871 Hypo-osmolality and hyponatremia: Secondary | ICD-10-CM | POA: Diagnosis not present

## 2021-08-16 DIAGNOSIS — J90 Pleural effusion, not elsewhere classified: Secondary | ICD-10-CM | POA: Diagnosis not present

## 2021-08-16 DIAGNOSIS — R918 Other nonspecific abnormal finding of lung field: Secondary | ICD-10-CM | POA: Diagnosis not present

## 2021-08-16 DIAGNOSIS — Z7951 Long term (current) use of inhaled steroids: Secondary | ICD-10-CM | POA: Diagnosis not present

## 2021-08-16 DIAGNOSIS — E872 Acidosis, unspecified: Secondary | ICD-10-CM | POA: Diagnosis present

## 2021-08-16 DIAGNOSIS — J9811 Atelectasis: Secondary | ICD-10-CM | POA: Diagnosis not present

## 2021-08-16 DIAGNOSIS — G931 Anoxic brain damage, not elsewhere classified: Secondary | ICD-10-CM | POA: Diagnosis present

## 2021-08-16 DIAGNOSIS — R930 Abnormal findings on diagnostic imaging of skull and head, not elsewhere classified: Secondary | ICD-10-CM | POA: Diagnosis not present

## 2021-08-16 DIAGNOSIS — Z9049 Acquired absence of other specified parts of digestive tract: Secondary | ICD-10-CM | POA: Diagnosis not present

## 2021-08-16 DIAGNOSIS — E876 Hypokalemia: Secondary | ICD-10-CM | POA: Diagnosis present

## 2021-08-16 DIAGNOSIS — R0689 Other abnormalities of breathing: Secondary | ICD-10-CM | POA: Diagnosis not present

## 2021-08-16 DIAGNOSIS — T424X1A Poisoning by benzodiazepines, accidental (unintentional), initial encounter: Secondary | ICD-10-CM | POA: Diagnosis present

## 2021-08-16 DIAGNOSIS — I499 Cardiac arrhythmia, unspecified: Secondary | ICD-10-CM | POA: Diagnosis not present

## 2021-08-16 DIAGNOSIS — R4182 Altered mental status, unspecified: Secondary | ICD-10-CM | POA: Diagnosis not present

## 2021-08-16 DIAGNOSIS — R000 Tachycardia, unspecified: Secondary | ICD-10-CM | POA: Diagnosis not present

## 2021-08-16 DIAGNOSIS — R404 Transient alteration of awareness: Secondary | ICD-10-CM | POA: Diagnosis not present

## 2021-08-16 LAB — POCT I-STAT 7, (LYTES, BLD GAS, ICA,H+H)
Acid-base deficit: 2 mmol/L (ref 0.0–2.0)
Bicarbonate: 23.4 mmol/L (ref 20.0–28.0)
Calcium, Ion: 1.16 mmol/L (ref 1.15–1.40)
HCT: 32 % — ABNORMAL LOW (ref 36.0–46.0)
Hemoglobin: 10.9 g/dL — ABNORMAL LOW (ref 12.0–15.0)
O2 Saturation: 97 %
Patient temperature: 99.3
Potassium: 3.4 mmol/L — ABNORMAL LOW (ref 3.5–5.1)
Sodium: 140 mmol/L (ref 135–145)
TCO2: 25 mmol/L (ref 22–32)
pCO2 arterial: 44 mmHg (ref 32–48)
pH, Arterial: 7.335 — ABNORMAL LOW (ref 7.35–7.45)
pO2, Arterial: 99 mmHg (ref 83–108)

## 2021-08-16 LAB — I-STAT VENOUS BLOOD GAS, ED
Acid-base deficit: 10 mmol/L — ABNORMAL HIGH (ref 0.0–2.0)
Bicarbonate: 14 mmol/L — ABNORMAL LOW (ref 20.0–28.0)
Calcium, Ion: 1.04 mmol/L — ABNORMAL LOW (ref 1.15–1.40)
HCT: 35 % — ABNORMAL LOW (ref 36.0–46.0)
Hemoglobin: 11.9 g/dL — ABNORMAL LOW (ref 12.0–15.0)
O2 Saturation: 98 %
Potassium: 3.1 mmol/L — ABNORMAL LOW (ref 3.5–5.1)
Sodium: 138 mmol/L (ref 135–145)
TCO2: 15 mmol/L — ABNORMAL LOW (ref 22–32)
pCO2, Ven: 26.3 mmHg — ABNORMAL LOW (ref 44–60)
pH, Ven: 7.334 (ref 7.25–7.43)
pO2, Ven: 110 mmHg — ABNORMAL HIGH (ref 32–45)

## 2021-08-16 LAB — CBC WITH DIFFERENTIAL/PLATELET
Abs Immature Granulocytes: 0.31 10*3/uL — ABNORMAL HIGH (ref 0.00–0.07)
Basophils Absolute: 0 10*3/uL (ref 0.0–0.1)
Basophils Relative: 1 %
Eosinophils Absolute: 0.2 10*3/uL (ref 0.0–0.5)
Eosinophils Relative: 2 %
HCT: 37.9 % (ref 36.0–46.0)
Hemoglobin: 10.5 g/dL — ABNORMAL LOW (ref 12.0–15.0)
Immature Granulocytes: 4 %
Lymphocytes Relative: 32 %
Lymphs Abs: 2.7 10*3/uL (ref 0.7–4.0)
MCH: 23.1 pg — ABNORMAL LOW (ref 26.0–34.0)
MCHC: 27.7 g/dL — ABNORMAL LOW (ref 30.0–36.0)
MCV: 83.3 fL (ref 80.0–100.0)
Monocytes Absolute: 0.7 10*3/uL (ref 0.1–1.0)
Monocytes Relative: 8 %
Neutro Abs: 4.7 10*3/uL (ref 1.7–7.7)
Neutrophils Relative %: 53 %
Platelets: 244 10*3/uL (ref 150–400)
RBC: 4.55 MIL/uL (ref 3.87–5.11)
RDW: 19.1 % — ABNORMAL HIGH (ref 11.5–15.5)
WBC: 8.6 10*3/uL (ref 4.0–10.5)
nRBC: 0.2 % (ref 0.0–0.2)

## 2021-08-16 LAB — URINALYSIS, ROUTINE W REFLEX MICROSCOPIC
Bilirubin Urine: NEGATIVE
Glucose, UA: 50 mg/dL — AB
Hgb urine dipstick: NEGATIVE
Ketones, ur: 5 mg/dL — AB
Leukocytes,Ua: NEGATIVE
Nitrite: NEGATIVE
Protein, ur: 100 mg/dL — AB
Specific Gravity, Urine: 1.012 (ref 1.005–1.030)
pH: 6 (ref 5.0–8.0)

## 2021-08-16 LAB — ETHANOL: Alcohol, Ethyl (B): 52 mg/dL — ABNORMAL HIGH (ref <10)

## 2021-08-16 LAB — COMPREHENSIVE METABOLIC PANEL
ALT: 228 U/L — ABNORMAL HIGH (ref 0–44)
AST: 390 U/L — ABNORMAL HIGH (ref 15–41)
Albumin: 3.1 g/dL — ABNORMAL LOW (ref 3.5–5.0)
Alkaline Phosphatase: 172 U/L — ABNORMAL HIGH (ref 38–126)
Anion gap: 15 (ref 5–15)
BUN: 9 mg/dL (ref 6–20)
CO2: 12 mmol/L — ABNORMAL LOW (ref 22–32)
Calcium: 8.2 mg/dL — ABNORMAL LOW (ref 8.9–10.3)
Chloride: 109 mmol/L (ref 98–111)
Creatinine, Ser: 0.78 mg/dL (ref 0.44–1.00)
GFR, Estimated: >60 mL/min (ref >60)
Glucose, Bld: 188 mg/dL — ABNORMAL HIGH (ref 70–99)
Potassium: 3 mmol/L — ABNORMAL LOW (ref 3.5–5.1)
Sodium: 136 mmol/L (ref 135–145)
Total Bilirubin: 0.6 mg/dL (ref 0.3–1.2)
Total Protein: 6.6 g/dL (ref 6.5–8.1)

## 2021-08-16 LAB — I-STAT ARTERIAL BLOOD GAS, ED
Acid-base deficit: 4 mmol/L — ABNORMAL HIGH (ref 0.0–2.0)
Bicarbonate: 20.3 mmol/L (ref 20.0–28.0)
Calcium, Ion: 1.13 mmol/L — ABNORMAL LOW (ref 1.15–1.40)
HCT: 31 % — ABNORMAL LOW (ref 36.0–46.0)
Hemoglobin: 10.5 g/dL — ABNORMAL LOW (ref 12.0–15.0)
O2 Saturation: 88 %
Patient temperature: 96.8
Potassium: 3.1 mmol/L — ABNORMAL LOW (ref 3.5–5.1)
Sodium: 137 mmol/L (ref 135–145)
TCO2: 21 mmol/L — ABNORMAL LOW (ref 22–32)
pCO2 arterial: 33.9 mmHg (ref 32–48)
pH, Arterial: 7.381 (ref 7.35–7.45)
pO2, Arterial: 52 mmHg — ABNORMAL LOW (ref 83–108)

## 2021-08-16 LAB — I-STAT BETA HCG BLOOD, ED (MC, WL, AP ONLY): I-stat hCG, quantitative: <5 m[IU]/mL (ref <5)

## 2021-08-16 LAB — TROPONIN I (HIGH SENSITIVITY): Troponin I (High Sensitivity): 19 ng/L — ABNORMAL HIGH (ref <18)

## 2021-08-16 LAB — I-STAT CHEM 8, ED
BUN: 8 mg/dL (ref 6–20)
Calcium, Ion: 1.05 mmol/L — ABNORMAL LOW (ref 1.15–1.40)
Chloride: 107 mmol/L (ref 98–111)
Creatinine, Ser: 0.7 mg/dL (ref 0.44–1.00)
Glucose, Bld: 192 mg/dL — ABNORMAL HIGH (ref 70–99)
HCT: 36 % (ref 36.0–46.0)
Hemoglobin: 12.2 g/dL (ref 12.0–15.0)
Potassium: 3 mmol/L — ABNORMAL LOW (ref 3.5–5.1)
Sodium: 139 mmol/L (ref 135–145)
TCO2: 16 mmol/L — ABNORMAL LOW (ref 22–32)

## 2021-08-16 LAB — GLUCOSE, CAPILLARY
Glucose-Capillary: 104 mg/dL — ABNORMAL HIGH (ref 70–99)
Glucose-Capillary: 137 mg/dL — ABNORMAL HIGH (ref 70–99)

## 2021-08-16 LAB — RAPID URINE DRUG SCREEN, HOSP PERFORMED
Amphetamines: NOT DETECTED
Barbiturates: NOT DETECTED
Benzodiazepines: POSITIVE — AB
Cocaine: NOT DETECTED
Opiates: NOT DETECTED
Tetrahydrocannabinol: NOT DETECTED

## 2021-08-16 LAB — LACTIC ACID, PLASMA: Lactic Acid, Venous: 7.1 mmol/L (ref 0.5–1.9)

## 2021-08-16 LAB — MRSA NEXT GEN BY PCR, NASAL: MRSA by PCR Next Gen: NOT DETECTED

## 2021-08-16 LAB — ACETAMINOPHEN LEVEL: Acetaminophen (Tylenol), Serum: <10 ug/mL — ABNORMAL LOW (ref 10–30)

## 2021-08-16 LAB — SALICYLATE LEVEL: Salicylate Lvl: <7 mg/dL — ABNORMAL LOW (ref 7.0–30.0)

## 2021-08-16 MED ORDER — AMPICILLIN-SULBACTAM SODIUM 1.5 (1-0.5) G IJ SOLR: 1.5000 g | Freq: Once | INTRAMUSCULAR | Status: DC

## 2021-08-16 MED ORDER — POTASSIUM CHLORIDE 10 MEQ/100ML IV SOLN
INTRAVENOUS | Status: AC
  Administered 2021-08-17: 10 meq via INTRAVENOUS
  Filled 2021-08-16: qty 100

## 2021-08-16 MED ORDER — LEVETIRACETAM IN NACL 500 MG/100ML IV SOLN
500.0000 mg | Freq: Two times a day (BID) | INTRAVENOUS | Status: DC
  Administered 2021-08-17 – 2021-08-31 (×29): 500 mg via INTRAVENOUS
  Filled 2021-08-16 (×30): qty 100

## 2021-08-16 MED ORDER — INSULIN ASPART 100 UNIT/ML IJ SOLN
0.0000 [IU] | INTRAMUSCULAR | Status: DC
  Administered 2021-08-16 – 2021-08-17 (×3): 2 [IU] via SUBCUTANEOUS

## 2021-08-16 MED ORDER — PROPOFOL 1000 MG/100ML IV EMUL
INTRAVENOUS | Status: AC
  Administered 2021-08-16: 12 ug/kg/min via INTRAVENOUS
  Filled 2021-08-16: qty 100

## 2021-08-16 MED ORDER — ACETAMINOPHEN 160 MG/5ML PO SOLN
650.0000 mg | ORAL | Status: DC | PRN
  Administered 2021-08-18 – 2021-08-28 (×5): 650 mg
  Filled 2021-08-16 (×5): qty 20.3

## 2021-08-16 MED ORDER — POTASSIUM CHLORIDE 10 MEQ/100ML IV SOLN
10.0000 meq | INTRAVENOUS | Status: AC
  Administered 2021-08-16: 10 meq via INTRAVENOUS
  Filled 2021-08-16 (×3): qty 100

## 2021-08-16 MED ORDER — PANTOPRAZOLE 2 MG/ML SUSPENSION
40.0000 mg | Freq: Every day | ORAL | Status: DC
  Administered 2021-08-17 – 2021-08-29 (×13): 40 mg
  Filled 2021-08-16 (×14): qty 20

## 2021-08-16 MED ORDER — HEPARIN SODIUM (PORCINE) 5000 UNIT/ML IJ SOLN
5000.0000 [IU] | Freq: Three times a day (TID) | INTRAMUSCULAR | Status: DC
  Administered 2021-08-16 – 2021-08-31 (×44): 5000 [IU] via SUBCUTANEOUS
  Filled 2021-08-16 (×43): qty 1

## 2021-08-16 MED ORDER — SODIUM CHLORIDE 0.9 % IV SOLN
250.0000 mL | INTRAVENOUS | Status: DC
  Administered 2021-08-19 – 2021-08-22 (×3): 250 mL via INTRAVENOUS

## 2021-08-16 MED ORDER — FENTANYL CITRATE PF 50 MCG/ML IJ SOSY: 50.0000 ug | PREFILLED_SYRINGE | Freq: Once | INTRAMUSCULAR | Status: DC

## 2021-08-16 MED ORDER — METRONIDAZOLE 500 MG/100ML IV SOLN: 500.0000 mg | Freq: Two times a day (BID) | INTRAVENOUS | Status: DC

## 2021-08-16 MED ORDER — LEVETIRACETAM IN NACL 1000 MG/100ML IV SOLN
1000.0000 mg | Freq: Once | INTRAVENOUS | Status: AC
  Administered 2021-08-16: 1000 mg via INTRAVENOUS
  Filled 2021-08-16: qty 100

## 2021-08-16 MED ORDER — ORAL CARE MOUTH RINSE
15.0000 mL | OROMUCOSAL | Status: DC
  Administered 2021-08-16 – 2021-08-31 (×144): 15 mL via OROMUCOSAL

## 2021-08-16 MED ORDER — FENTANYL CITRATE (PF) 100 MCG/2ML IJ SOLN
INTRAMUSCULAR | Status: AC
  Administered 2021-08-16: 100 ug via INTRAVENOUS
  Filled 2021-08-16: qty 2

## 2021-08-16 MED ORDER — CHLORHEXIDINE GLUCONATE 0.12% ORAL RINSE (MEDLINE KIT)
15.0000 mL | Freq: Two times a day (BID) | OROMUCOSAL | Status: DC
  Administered 2021-08-16 – 2021-08-31 (×30): 15 mL via OROMUCOSAL

## 2021-08-16 MED ORDER — ONDANSETRON HCL 4 MG/2ML IJ SOLN: 4.0000 mg | Freq: Four times a day (QID) | INTRAMUSCULAR | Status: DC | PRN

## 2021-08-16 MED ORDER — ACETAMINOPHEN 650 MG RE SUPP: 650.0000 mg | RECTAL | Status: AC

## 2021-08-16 MED ORDER — LACTATED RINGERS IV SOLN: INTRAVENOUS | Status: DC

## 2021-08-16 MED ORDER — DOCUSATE SODIUM 50 MG/5ML PO LIQD
100.0000 mg | Freq: Two times a day (BID) | ORAL | Status: DC
  Administered 2021-08-16 – 2021-08-29 (×21): 100 mg
  Filled 2021-08-16 (×21): qty 10

## 2021-08-16 MED ORDER — FENTANYL BOLUS VIA INFUSION
50.0000 ug | INTRAVENOUS | Status: DC | PRN
  Administered 2021-08-18 – 2021-08-19 (×2): 100 ug via INTRAVENOUS
  Administered 2021-08-20: 50 ug via INTRAVENOUS
  Administered 2021-08-21 (×2): 100 ug via INTRAVENOUS
  Administered 2021-08-21: 50 ug via INTRAVENOUS
  Administered 2021-08-21: 100 ug via INTRAVENOUS
  Administered 2021-08-21: 50 ug via INTRAVENOUS
  Administered 2021-08-22 – 2021-08-24 (×14): 100 ug via INTRAVENOUS
  Administered 2021-08-27: 50 ug via INTRAVENOUS
  Administered 2021-08-31 (×3): 100 ug via INTRAVENOUS
  Administered 2021-08-31: 50 ug via INTRAVENOUS
  Administered 2021-08-31 (×8): 100 ug via INTRAVENOUS
  Filled 2021-08-16: qty 100

## 2021-08-16 MED ORDER — MAGNESIUM SULFATE 2 GM/50ML IV SOLN
2.0000 g | Freq: Once | INTRAVENOUS | Status: AC | PRN
  Administered 2021-08-17: 2 g via INTRAVENOUS
  Filled 2021-08-16: qty 50

## 2021-08-16 MED ORDER — ACETAMINOPHEN 160 MG/5ML PO SOLN
650.0000 mg | ORAL | Status: AC
  Administered 2021-08-16 – 2021-08-18 (×9): 650 mg
  Filled 2021-08-16 (×8): qty 20.3

## 2021-08-16 MED ORDER — SODIUM CHLORIDE 0.9 % IV SOLN: 2.0000 g | Freq: Once | INTRAVENOUS | Status: DC

## 2021-08-16 MED ORDER — BUSPIRONE HCL 10 MG PO TABS
30.0000 mg | ORAL_TABLET | Freq: Three times a day (TID) | ORAL | Status: AC | PRN
  Filled 2021-08-16: qty 3

## 2021-08-16 MED ORDER — FENTANYL CITRATE PF 50 MCG/ML IJ SOSY: 100.0000 ug | PREFILLED_SYRINGE | Freq: Once | INTRAMUSCULAR | Status: DC

## 2021-08-16 MED ORDER — ACETAMINOPHEN 325 MG PO TABS: 650.0000 mg | ORAL_TABLET | ORAL | Status: AC

## 2021-08-16 MED ORDER — FENTANYL CITRATE (PF) 100 MCG/2ML IJ SOLN
INTRAMUSCULAR | Status: DC | PRN
  Administered 2021-08-16: 100 ug via INTRAVENOUS

## 2021-08-16 MED ORDER — SODIUM CHLORIDE 0.9 % IV SOLN
2.0000 g | INTRAVENOUS | Status: AC
  Administered 2021-08-16 – 2021-08-20 (×5): 2 g via INTRAVENOUS
  Filled 2021-08-16 (×5): qty 20

## 2021-08-16 MED ORDER — CHLORHEXIDINE GLUCONATE CLOTH 2 % EX PADS
6.0000 | MEDICATED_PAD | Freq: Every day | CUTANEOUS | Status: DC
  Administered 2021-08-16 – 2021-08-31 (×18): 6 via TOPICAL

## 2021-08-16 MED ORDER — ACETAMINOPHEN 325 MG PO TABS
650.0000 mg | ORAL_TABLET | ORAL | Status: DC | PRN
Start: 2021-08-18 — End: 2021-09-01

## 2021-08-16 MED ORDER — POTASSIUM CHLORIDE 20 MEQ PO PACK
40.0000 meq | PACK | Freq: Once | ORAL | Status: AC
Start: 2021-08-16 — End: 2021-08-16
  Administered 2021-08-16: 40 meq
  Filled 2021-08-16: qty 2

## 2021-08-16 MED ORDER — PROPOFOL 1000 MG/100ML IV EMUL
0.0000 ug/kg/min | INTRAVENOUS | Status: DC
Start: 2021-08-16 — End: 2021-08-16

## 2021-08-16 MED ORDER — SUCCINYLCHOLINE CHLORIDE 20 MG/ML IJ SOLN
INTRAMUSCULAR | Status: AC | PRN
Start: 2021-08-16 — End: 2021-08-16
  Administered 2021-08-16: 160 mg via INTRAVENOUS

## 2021-08-16 MED ORDER — POLYETHYLENE GLYCOL 3350 17 G PO PACK
17.0000 g | PACK | Freq: Every day | ORAL | Status: DC
  Administered 2021-08-17 – 2021-08-29 (×9): 17 g
  Filled 2021-08-16 (×9): qty 1

## 2021-08-16 MED ORDER — POTASSIUM CHLORIDE 10 MEQ/100ML IV SOLN
INTRAVENOUS | Status: AC
  Administered 2021-08-16: 10 meq via INTRAVENOUS
  Filled 2021-08-16: qty 100

## 2021-08-16 MED ORDER — BUSPIRONE HCL 10 MG PO TABS
30.0000 mg | ORAL_TABLET | Freq: Three times a day (TID) | ORAL | Status: AC | PRN
  Administered 2021-08-17: 30 mg

## 2021-08-16 MED ORDER — ACETAMINOPHEN 650 MG RE SUPP: 650.0000 mg | RECTAL | Status: DC | PRN

## 2021-08-16 MED ORDER — NOREPINEPHRINE 4 MG/250ML-% IV SOLN
2.0000 ug/min | INTRAVENOUS | Status: DC
  Administered 2021-08-21: 2 ug/min via INTRAVENOUS
  Filled 2021-08-16: qty 250

## 2021-08-16 MED ORDER — ETOMIDATE 2 MG/ML IV SOLN
INTRAVENOUS | Status: AC | PRN
  Administered 2021-08-16: 20 mg via INTRAVENOUS

## 2021-08-16 MED ORDER — FENTANYL BOLUS VIA INFUSION
50.0000 ug | INTRAVENOUS | Status: DC | PRN
  Filled 2021-08-16: qty 100

## 2021-08-16 MED ORDER — PROPOFOL 1000 MG/100ML IV EMUL: 0.0000 ug/kg/min | INTRAVENOUS | Status: DC

## 2021-08-16 MED ORDER — FENTANYL 2500MCG IN NS 250ML (10MCG/ML) PREMIX INFUSION
50.0000 ug/h | INTRAVENOUS | Status: DC
  Administered 2021-08-16: 150 ug/h via INTRAVENOUS
  Filled 2021-08-16: qty 250

## 2021-08-16 MED ORDER — FENTANYL 2500MCG IN NS 250ML (10MCG/ML) PREMIX INFUSION
0.0000 ug/h | INTRAVENOUS | Status: DC
  Administered 2021-08-16 – 2021-08-19 (×5): 200 ug/h via INTRAVENOUS
  Administered 2021-08-19: 150 ug/h via INTRAVENOUS
  Administered 2021-08-20 – 2021-08-22 (×6): 200 ug/h via INTRAVENOUS
  Administered 2021-08-23 (×2): 150 ug/h via INTRAVENOUS
  Administered 2021-08-24 (×2): 175 ug/h via INTRAVENOUS
  Administered 2021-08-25: 125 ug/h via INTRAVENOUS
  Administered 2021-08-26 (×2): 150 ug/h via INTRAVENOUS
  Administered 2021-08-27: 100 ug/h via INTRAVENOUS
  Administered 2021-08-28 – 2021-08-29 (×3): 150 ug/h via INTRAVENOUS
  Administered 2021-08-30: 200 ug/h via INTRAVENOUS
  Administered 2021-08-30: 160 ug/h via INTRAVENOUS
  Administered 2021-08-31: 200 ug/h via INTRAVENOUS
  Administered 2021-08-31: 400 ug/h via INTRAVENOUS
  Filled 2021-08-16 (×28): qty 250

## 2021-08-16 MED ORDER — MIDAZOLAM-SODIUM CHLORIDE 100-0.9 MG/100ML-% IV SOLN
0.5000 mg/h | INTRAVENOUS | Status: DC
  Administered 2021-08-16: 0.5 mg/h via INTRAVENOUS
  Administered 2021-08-17: 15 mg/h via INTRAVENOUS
  Administered 2021-08-17: 20 mg/h via INTRAVENOUS
  Administered 2021-08-18: 12 mg/h via INTRAVENOUS
  Administered 2021-08-18 – 2021-08-20 (×6): 10 mg/h via INTRAVENOUS
  Administered 2021-08-21: 27 mg/h via INTRAVENOUS
  Administered 2021-08-21: 11 mg/h via INTRAVENOUS
  Administered 2021-08-21: 16 mg/h via INTRAVENOUS
  Administered 2021-08-21: 27 mg/h via INTRAVENOUS
  Administered 2021-08-22: 19 mg/h via INTRAVENOUS
  Administered 2021-08-22: 18 mg/h via INTRAVENOUS
  Administered 2021-08-22: 10 mg/h via INTRAVENOUS
  Administered 2021-08-22: 18 mg/h via INTRAVENOUS
  Administered 2021-08-23: 7 mg/h via INTRAVENOUS
  Administered 2021-08-23: 16 mg/h via INTRAVENOUS
  Administered 2021-08-23: 12 mg/h via INTRAVENOUS
  Administered 2021-08-24: 16 mg/h via INTRAVENOUS
  Administered 2021-08-24 (×2): 14 mg/h via INTRAVENOUS
  Administered 2021-08-25: 16 mg/h via INTRAVENOUS
  Administered 2021-08-25: 12 mg/h via INTRAVENOUS
  Administered 2021-08-25 – 2021-08-26 (×2): 14 mg/h via INTRAVENOUS
  Administered 2021-08-26: 12 mg/h via INTRAVENOUS
  Administered 2021-08-26: 14 mg/h via INTRAVENOUS
  Administered 2021-08-27: 13 mg/h via INTRAVENOUS
  Administered 2021-08-27 (×2): 14 mg/h via INTRAVENOUS
  Administered 2021-08-28 – 2021-08-30 (×10): 13 mg/h via INTRAVENOUS
  Administered 2021-08-31 (×2): 40 mg/h via INTRAVENOUS
  Administered 2021-08-31: 13 mg/h via INTRAVENOUS
  Administered 2021-08-31: 40 mg/h via INTRAVENOUS
  Administered 2021-08-31: 13 mg/h via INTRAVENOUS
  Filled 2021-08-16 (×48): qty 100

## 2021-08-16 MED ORDER — PROPOFOL 1000 MG/100ML IV EMUL
0.0000 ug/kg/min | INTRAVENOUS | Status: DC
  Administered 2021-08-16: 40 ug/kg/min via INTRAVENOUS
  Administered 2021-08-17 (×2): 50 ug/kg/min via INTRAVENOUS
  Filled 2021-08-16: qty 300
  Filled 2021-08-16: qty 100

## 2021-08-16 NOTE — ED Provider Notes (Signed)
?[facility] EMERGENCY DEPARTMENT ?Provider Note ? ? ?CSN: [id number] ?Arrival date & time: 09/04/2021  1641 ? ?  ? ?History ? ?Chief Complaint  ?Patient presents with  ? post cpr  ? ? ?Vicki Moore is a 41 y.o. female. ? ?HPI ? ?Patient is a 40 year old female with multiple medical history including substance use and WPW who presents to the emergency department by EMS after being found down by her 51 year old son.  EMS report patient was unresponsive.  On the monitor she was PEA.  She received 1 round of CPR and 1 round of epi.  In addition she got 2 mg of Narcan.  She received CPR for about 7 minutes with a Lucas machine and they were able to obtain pulse.  She was initially bagged however started having spontaneous respiratory.  She came in with a [facility] airway.   ? ?Spoke with her dad who report his girlfriend called him because she found her on the ground.  He report she was seen by cardiology due to concern for her heart.  He believes there is a conversation about placing a stent.  No report of using any illicit substance that he remembers.  Otherwise he does not know exactly what happened. ? ?History limited. ? ?Home Medications ?Prior to Admission medications   ?Medication Sig Start Date End Date Taking? Authorizing Provider  ?acetaminophen (TYLENOL) 325 MG tablet Take 2 tablets (650 mg total) by mouth every 6 (six) hours as needed. 11/19/20   Sloan Leiter, DO  ?acidophilus (RISAQUAD) CAPS capsule Take 1 capsule by mouth daily. 11/28/20   Pokhrel, Rebekah Chesterfield, MD  ?albuterol (VENTOLIN HFA) 108 (90 Base) MCG/ACT inhaler Inhale 1-2 puffs into the lungs every 6 (six) hours as needed for wheezing or shortness of breath. 03/09/21   Zadie Rhine, MD  ?Calcium Carbonate Antacid (TUMS PO) Take 1-2 tablets by mouth every hour as needed (acid reflux/heartburn).    [provider]  ?dicyclomine (BENTYL) 20 MG tablet Take 1 tablet (20 mg total) by mouth 2 (two) times daily. 11/19/20   Sloan Leiter, DO  ?fluticasone-salmeterol (ADVAIR DISKUS) 500-50 MCG/ACT AEPB Inhale 1 puff into the lungs 2 (two) times daily. 11/28/20   Pokhrel, Rebekah Chesterfield, MD  ?ibuprofen (ADVIL) 600 MG tablet Take 1 tablet (600 mg total) by mouth every 8 (eight) hours as needed. 12/13/20   Eleonore Chiquito, FNP  ?ipratropium-albuterol (DUONEB) 0.5-2.5 (3) MG/3ML SOLN Take 3 mLs by nebulization 3 (three) times daily as needed for wheezing or shortness of breath. 10/11/19   [provider]  ?levalbuterol Pauline Aus HFA) 45 MCG/ACT inhaler Inhale 2 puffs into the lungs every 4 (four) hours as needed for wheezing. 11/28/20 11/28/21  Pokhrel, Rebekah Chesterfield, MD  ?metoprolol succinate (TOPROL-XL) 25 MG 24 hr tablet Take 1 tablet (25 mg total) by mouth daily. 07/18/21   Marinus Maw, MD  ?nicotine (NICODERM CQ - DOSED IN MG/24 HOURS) 14 mg/24hr patch Place 1 patch (14 mg total) onto the skin daily. 08/07/20   Pokhrel, Rebekah Chesterfield, MD  ?pantoprazole (PROTONIX) 40 MG tablet Take 1 tablet (40 mg total) by mouth 2 (two) times daily before a meal. 11/15/19   Amin, Loura Halt, MD  ?potassium chloride SA (KLOR-CON) 20 MEQ tablet Take 1 tablet (20 mEq total) by mouth daily for 10 days. 11/28/20 12/08/20  Pokhrel, Rebekah Chesterfield, MD  ?promethazine (PHENERGAN) 12.5 MG tablet Take 1 tablet (12.5 mg total) by mouth every 6 (six) hours as needed for nausea or vomiting. 11/28/20  Pokhrel, Laxman, MD  ?tetrahydrozoline-zinc (VISINE-AC) 0.05-0.25 % ophthalmic solution Place 1 drop into both eyes 3 (three) times daily as needed (dry eyes).    [provider]  ?valACYclovir (VALTREX) 1000 MG tablet Take 1,000 mg by mouth 2 (two) times daily as needed (breakouts). 02/09/20   [provider]  ?   ? ?Allergies    ?Peanut-containing drug products and Penicillins   ? ?Review of Systems   ?Review of Systems ? ?Physical Exam ?Updated Vital Signs ?BP 110/82   Pulse (!) 104   Temp 97.6 ?F (36.4 ?C)   Resp 20   Ht (S) 5\' 3"  (1.6 m)   SpO2 100%   BMI 32.03 kg/m?  ?Physical  Exam ?Constitutional:   ?   Comments: Unresponsive, King airway in place  ?HENT:  ?   Head: Normocephalic.  ?   Nose: Congestion present.  ?   Mouth/Throat:  ?   Comments: King airway in place ?Cardiovascular:  ?   Rate and Rhythm: Tachycardia present.  ?Pulmonary:  ?   Comments: Repress respiratory effort however patient is spontaneously breathing ?Abdominal:  ?   General: There is no distension.  ?   Palpations: Abdomen is soft.  ?Musculoskeletal:     ?   General: No deformity or signs of injury.  ?   Cervical back: Normal range of motion. No tenderness.  ?Skin: ?   General: Skin is warm.  ?Neurological:  ?   GCS: GCS eye subscore is 1. GCS verbal subscore is 1. GCS motor subscore is 4.  ?   Comments: Pupils are 3 mm equal and reactive bilaterally.  There is spontaneous movement in bilateral upper extremity.  No spontaneous movement observed in the lower extremity.  Neuro exam limited.  ? ? ?ED Results / Procedures / Treatments   ?Labs ?(all labs ordered are listed, but only abnormal results are displayed) ?Labs Reviewed  ?CBC WITH DIFFERENTIAL/PLATELET - Abnormal; Notable for the following components:  ?    Result Value  ? Hemoglobin 10.5 (*)   ? MCH 23.1 (*)   ? MCHC 27.7 (*)   ? RDW 19.1 (*)   ? Abs Immature Granulocytes 0.31 (*)   ? All other components within normal limits  ?COMPREHENSIVE METABOLIC PANEL - Abnormal; Notable for the following components:  ? Potassium 3.0 (*)   ? CO2 12 (*)   ? Glucose, Bld 188 (*)   ? Calcium 8.2 (*)   ? Albumin 3.1 (*)   ? AST 390 (*)   ? ALT 228 (*)   ? Alkaline Phosphatase 172 (*)   ? All other components within normal limits  ?LACTIC ACID, PLASMA - Abnormal; Notable for the following components:  ? Lactic Acid, Venous 7.1 (*)   ? All other components within normal limits  ?URINALYSIS, ROUTINE W REFLEX MICROSCOPIC - Abnormal; Notable for the following components:  ? APPearance HAZY (*)   ? Glucose, UA 50 (*)   ? Ketones, ur 5 (*)   ? Protein, ur 100 (*)   ? Bacteria, UA  RARE (*)   ? All other components within normal limits  ?RAPID URINE DRUG SCREEN, HOSP PERFORMED - Abnormal; Notable for the following components:  ? Benzodiazepines POSITIVE (*)   ? All other components within normal limits  ?I-STAT VENOUS BLOOD GAS, ED - Abnormal; Notable for the following components:  ? pCO2, Ven 26.3 (*)   ? pO2, Ven 110 (*)   ? Bicarbonate 14.0 (*)   ?  TCO2 15 (*)   ? Acid-base deficit 10.0 (*)   ? Potassium 3.1 (*)   ? Calcium, Ion 1.04 (*)   ? HCT 35.0 (*)   ? Hemoglobin 11.9 (*)   ? All other components within normal limits  ?I-STAT CHEM 8, ED - Abnormal; Notable for the following components:  ? Potassium 3.0 (*)   ? Glucose, Bld 192 (*)   ? Calcium, Ion 1.05 (*)   ? TCO2 16 (*)   ? All other components within normal limits  ?LACTIC ACID, PLASMA  ?ETHANOL  ?TRIGLYCERIDES  ?ACETAMINOPHEN LEVEL  ?SALICYLATE LEVEL  ?BLOOD GAS, ARTERIAL  ?TRIGLYCERIDES  ?BLOOD GAS, ARTERIAL  ?CREATININE, SERUM  ?HEPATIC FUNCTION PANEL  ?CBC  ?BASIC METABOLIC PANEL  ?MAGNESIUM  ?PHOSPHORUS  ?HEMOGLOBIN A1C  ?I-STAT BETA HCG BLOOD, ED ([facility], [facility], [facility] ONLY)  ?TROPONIN I (HIGH SENSITIVITY)  ? ? ?EKG ?EKG Interpretation ? ?Date/Time:  Saturday Aug 30, 2021 17:08:38 EDT ?Ventricular Rate:  102 ?PR Interval:  130 ?QRS Duration: 113 ?QT Interval:  388 ?QTC Calculation: 506 ?R Axis:   7 ?Text Interpretation: Sinus tachycardia Vent pre-excitat'n(WPW), left acces'y pathway No significant change since prior 11/22 Confirmed by Meridee Score [phone]) on 2021-08-30 5:10:47 PM ? ?Radiology ?DG Abdomen 1 View ? ?Result Date: 08-30-21 ?CLINICAL DATA:  OG tube placement. EXAM: ABDOMEN - 1 VIEW COMPARISON:  CT 03/09/2021 FINDINGS: Tip and side port of the enteric tube below the diaphragm in the stomach. There is a generalized paucity of bowel gas in the abdomen. Cholecystectomy clips in the right upper quadrant. IMPRESSION: Tip and side port of the enteric tube below the diaphragm in the stomach. Electronically Signed   By: Narda Rutherford M.D.   On: 2021-08-30 17:04  ? ?DG Chest Portable 1 View ? ?Result Date: 08-30-2021 ?CLINICAL DATA:  Intubation.  OG-tube placement. EXAM: PORTABLE CHEST 1 VIEW COMPARISON:  Radiograph 04/08/2021 FINDINGS: End

## 2021-08-16 NOTE — H&P (Signed)
? ?NAME:  Vicki Moore, MRN:  [id number], DOB:  08-21-1980, LOS: 0 ?ADMISSION DATE:  09/06/2021, CONSULTATION DATE:  08/22/2021 ?REFERRING MD:  Dr. Charm Barges, CHIEF COMPLAINT:  Cardiac arrest   ? ?History of Present Illness:  ?Vicki Moore is a 41 y.o. with a PMH WPW, Depression, anxiety, asthma and polysubstance abuse who presented to the ED via EMS after being found unresponsive by child. Per report patient was with 10yo son when she went to the bathroom and child heard a loud thud and patient was found unresponsive eon floor. On EMS arrival patient was found in PEA arrest, ACLS protocol ran and king airway placed. Unknown if patient received bystandard CPR. time between call and EMS arrival.  ? ?On ED arrival patient was seen minimally responsive (non-purposeful movement to upper extremities) with mild hypothermia and mild tachycardia. Lab work significant for K 3.0, CO2 12, Alkaline phos 172, AST 390, ALT 228, lactic 7.1, and hgb 10.5. CXR with left lung base opacities concerning for aspiration. PCCM consulted for further management and admission.  ? ?Pertinent  Medical History  ?WPW, Depression, anxiety, asthma and polysubstance abuse  ? ?Significant Hospital Events: ?Including procedures, antibiotic start and stop dates in addition to other pertinent events   ?5/6 admitted after out of hospital cardiac arrest  ? ?Interim History / Subjective:  ?As above  ? ?Objective   ?Blood pressure 110/75, pulse (!) 104, temperature (!) 97 ?F (36.1 ?C), temperature source Temporal, resp. rate (!) 29, SpO2 100 %. ?   ?FiO2 (%):  [100 %] 100 %  ?No intake or output data in the 24 hours ending 08/14/2021 1719 ?There were no vitals filed for this visit. ? ?Examination: ?General: Acute on chronic ill appearing adult female lying in bed  on mechanical ventilation, in NAD ?HEENT: ETT, MM pink/moist, PERRL,  ?Neuro: Unresponsive with dilated pupils, concern for myoclonic seizures  ?CV: s1s2 regular rate and rhythm, no  murmur, rubs, or gallops,  ?PULM:  Clear to ascultation, no increased work of breathing, tolerating vent  ?GI: soft, bowel sounds active in all 4 quadrants, non-tender, non-distended ?Extremities: warm/dry, no edema  ?Skin: no rashes or lesions ? ?Resolved Hospital Problem list   ? ? ?Assessment & Plan:  ?Out of hospital cardiac arrest ?-Concern for overdose vs arrhythmia given history of WPW ?-Estimated prolonged down time  ?Hx of WPW ?-Saw cardiology 07/18/2021 with recommendations of ablation   ?P: ?Normothermia  ?Close monitoring of hemodynamics ?Continuous telemetry  ?Assess ECHO, UDS. ?Trend troponin, lactate. ? ?Acute hypoxic respiratory failure  ?Concern for aspiration PNA ?- Secondary to above ?P: ?Continue ventilator support with lung protective strategies  ?Wean PEEP and FiO2 for sats greater than 90%. ?Head of bed elevated 30 degrees. ?Plateau pressures less than 30 cm H20.  ?Follow intermittent chest x-ray and ABG.   ?SAT/SBT as tolerated, mentation preclude extubation  ?Ensure adequate pulmonary hygiene  ?Follow cultures  ?VAP bundle in place  ?PAD protocol ?Empiric Ceftriaxone  ?Chest CT pending  ? ?At risk for anoxic encephalopathy ?P: ?Maintain neuro protective measures; goal for eurothermia, euglycemia, eunatermia, normoxia, and PCO2 goal of 35-40 ?Nutrition and bowel regiment  ?Seizure precautions  ?AEDs per neurology  ?Aspirations precautions  ?Head CT pending  ? ?Hypokalemia  ?P: ?Trend bmet  ?Supplement as needed  ? ?Elevated LFTs  ?-Likely secondary to arrest  ?P: ?Avoid hepatotoxins  ?Trend LFT  ?Can consider hepatitis panel  ? ?Best Practice (right click and "Reselect all SmartList Selections" daily)  ? ?  Diet/type: NPO ?DVT prophylaxis: prophylactic heparin  ?GI prophylaxis: PPI ?Lines: N/A ?Foley:  Yes, and it is still needed ?Code Status:  full code ?Last date of multidisciplinary goals of care discussion: Pending family discussion  ? ?Labs   ?CBC: ?Recent Labs  ?Lab 08/30/2021 ?1651  08/20/2021 ?1653  ?WBC  --  8.6  ?NEUTROABS  --  4.7  ?HGB 12.2 10.5*  11.9*  ?HCT 36.0 37.9  35.0*  ?MCV  --  83.3  ?PLT  --  244  ? ? ?Basic Metabolic Panel: ?Recent Labs  ?Lab 08/22/2021 ?1651 09/03/2021 ?1653  ?NA 139 138  ?K 3.0* 3.1*  ?CL 107  --   ?GLUCOSE 192*  --   ?BUN 8  --   ?CREATININE 0.70  --   ? ?GFR: ?CrCl cannot be calculated (Unknown ideal weight.). ?Recent Labs  ?Lab 08/19/2021 ?1653  ?WBC 8.6  ? ? ?Liver Function Tests: ?No results for input(s): AST, ALT, ALKPHOS, BILITOT, PROT, ALBUMIN in the last 168 hours. ?No results for input(s): LIPASE, AMYLASE in the last 168 hours. ?No results for input(s): AMMONIA in the last 168 hours. ? ?ABG ?   ?Component Value Date/Time  ? PHART 7.466 (H) 12/06/2020 1722  ? PCO2ART 29.3 (L) 12/06/2020 1722  ? PO2ART 57 (L) 12/06/2020 1722  ? HCO3 14.0 (L) 09/04/2021 1653  ? TCO2 15 (L) 08/11/2021 1653  ? ACIDBASEDEF 10.0 (H) 08/30/2021 1653  ? O2SAT 98 08/29/2021 1653  ?  ? ?Coagulation Profile: ?No results for input(s): INR, PROTIME in the last 168 hours. ? ?Cardiac Enzymes: ?No results for input(s): CKTOTAL, CKMB, CKMBINDEX, TROPONINI in the last 168 hours. ? ?HbA1C: ?Hgb A1c MFr Bld  ?Date/Time Value Ref Range Status  ?11/13/2019 03:50 AM 5.2 4.8 - 5.6 % Final  ?  Comment:  ?  (NOTE) ?Pre diabetes:          5.7%-6.4% ? ?Diabetes:              >6.4% ? ?Glycemic control for   <7.0% ?adults with diabetes ?  ?08/17/2010 01:20 PM  <5.7 % Final  ? 5.0 ?(NOTE)                                                                       According to the ADA Clinical Practice Recommendations for 2011, when HbA1c is used as a screening test:   >=6.5%   Diagnostic of Diabetes Mellitus           (if abnormal result ? is confirmed)  5.7-6.4%   Increased risk of developing Diabetes Mellitus  References:Diagnosis and Classification of Diabetes Mellitus,Diabetes Care,2011,34(Suppl 1):S62-S69 and Standards of Medical Care in         Diabetes - 2011,Diabetes GNFA,2130,86Care,2011,34  ?(Suppl 1):S11-S61.   ? ? ?CBG: ?No results for input(s): GLUCAP in the last 168 hours. ? ?Review of Systems:   ?Unable to assess  ? ?Past Medical History:  ?She,  has a past medical history of Anxiety, Asthma, Asthma, Depression, and GERD (gastroesophageal reflux disease).  ? ?Surgical History:  ? ?Past Surgical History:  ?Procedure Laterality Date  ? BIOPSY  11/23/2020  ? Procedure: BIOPSY;  Surgeon: Shellia Cleverlyirigliano, Vito V, DO;  Location: [facility];  Service: Gastroenterology;;  ?  CHOLECYSTECTOMY N/A 12/08/2019  ? Procedure: LAPAROSCOPIC CHOLECYSTECTOMY;  Surgeon: Franky Macho, MD;  Location: [facility];  Service: General;  Laterality: N/A;  ? ESOPHAGOGASTRODUODENOSCOPY (EGD) WITH PROPOFOL N/A 11/23/2020  ? Procedure: ESOPHAGOGASTRODUODENOSCOPY (EGD) WITH PROPOFOL;  Surgeon: Shellia Cleverly, DO;  Location: [facility];  Service: Gastroenterology;  Laterality: N/A;  ?  ? ?Social History:  ? reports that she has been smoking cigarettes. She has a 2.50 pack-year smoking history. She has never used smokeless tobacco. She reports current alcohol use. She reports that she does not currently use drugs.  ? ?Family History:  ?Her family history is not on file.  ? ?Allergies ?Allergies  ?Allergen Reactions  ? Peanut-Containing Drug Products Hives and Shortness Of Breath  ? Penicillins Anaphylaxis, Hives, Nausea And Vomiting and Rash  ?   ? ?  ?  ? ?Home Medications  ?Prior to Admission medications   ?Medication Sig Start Date End Date Taking? Authorizing Provider  ?acetaminophen (TYLENOL) 325 MG tablet Take 2 tablets (650 mg total) by mouth every 6 (six) hours as needed. 11/19/20   Sloan Leiter, DO  ?acidophilus (RISAQUAD) CAPS capsule Take 1 capsule by mouth daily. 11/28/20   Pokhrel, Rebekah Chesterfield, MD  ?albuterol (VENTOLIN HFA) 108 (90 Base) MCG/ACT inhaler Inhale 1-2 puffs into the lungs every 6 (six) hours as needed for wheezing or shortness of breath. 03/09/21   Zadie Rhine, MD  ?Calcium Carbonate Antacid (TUMS PO) Take 1-2 tablets by mouth  every hour as needed (acid reflux/heartburn).    [provider]  ?dicyclomine (BENTYL) 20 MG tablet Take 1 tablet (20 mg total) by mouth 2 (two) times daily. 11/19/20   Sloan Leiter, DO  ?fluticasone-salmeterol (

## 2021-08-16 NOTE — Progress Notes (Signed)
Patient's ETT was advanced 2 cm at this time due to an audible cuff leak. Once tube was advanced, no cuff leak was present.  ?

## 2021-08-16 NOTE — Care Management (Signed)
Called patient home number, no answer, mailbox is full. Called Debbe Bales, Phone is out of service. Called patients home phone. Father Caryn Bee answered phone. Received father and mothers numbers and placed them in contact information. Patients son is with his grandfather.  Father will come in to see patient ?

## 2021-08-16 NOTE — Progress Notes (Signed)
Updated father by phone. ?He is en route to see her. ?Will give full update in AM as info comes in. ? ?Erskine Emery MD PCCM ?

## 2021-08-16 NOTE — ED Triage Notes (Signed)
Pt BIB [facility] from home.  Her 40 yo son called about hearing his mom hit the floor in the bathroom.  She was down 20 mins before [facility] got to the scene.  [facility] started 1 round CPR and she was given 1 round of Epi and 2mg  of Narcan. machine was used for about 7 minutes.  Pulses felt and capnography low.  BP 163/141.  IO left tibia. Drug paraphernalia in the bathroom. Hx of drug abuse.  Hx of MI. ?

## 2021-08-16 NOTE — Progress Notes (Signed)
eLink Physician-Brief Progress Note ?Patient Name: Vicki Moore ?DOB: 02-May-1980 ?MRN: [id number] ? ? ?Date of Service ? 08/23/2021  ?HPI/Events of Note ? Nursing reports myoclonic activity with simulation in setting of post cardiac arrest and likely anoxic injury. EEG already ordered. Now without myoclonic activity on video exam. Patient is currently on a Versed and Propofol IV infusion for sedation.   ?eICU Interventions ? Plan: ?Keppra 1 gm IV now, then 500 mg IV Q 12 hours. ?Defer Neurology consultation to PCCM day rounding team unless she has uncontrolled activity.   ? ? ? ?Intervention Category ?Major Interventions: Other: ? ?Randell Teare Dennard Nip ?08/22/2021, 9:15 PM ?

## 2021-08-17 ENCOUNTER — Inpatient Hospital Stay: Payer: Self-pay

## 2021-08-17 ENCOUNTER — Inpatient Hospital Stay (HOSPITAL_COMMUNITY): Payer: Medicaid Other

## 2021-08-17 DIAGNOSIS — I469 Cardiac arrest, cause unspecified: Secondary | ICD-10-CM | POA: Diagnosis not present

## 2021-08-17 LAB — HEPATIC FUNCTION PANEL
ALT: 195 U/L — ABNORMAL HIGH (ref 0–44)
AST: 201 U/L — ABNORMAL HIGH (ref 15–41)
Albumin: 2.9 g/dL — ABNORMAL LOW (ref 3.5–5.0)
Alkaline Phosphatase: 173 U/L — ABNORMAL HIGH (ref 38–126)
Bilirubin, Direct: 0.4 mg/dL — ABNORMAL HIGH (ref 0.0–0.2)
Indirect Bilirubin: 0.8 mg/dL (ref 0.3–0.9)
Total Bilirubin: 1.2 mg/dL (ref 0.3–1.2)
Total Protein: 6.3 g/dL — ABNORMAL LOW (ref 6.5–8.1)

## 2021-08-17 LAB — GLUCOSE, CAPILLARY
Glucose-Capillary: 103 mg/dL — ABNORMAL HIGH (ref 70–99)
Glucose-Capillary: 116 mg/dL — ABNORMAL HIGH (ref 70–99)
Glucose-Capillary: 121 mg/dL — ABNORMAL HIGH (ref 70–99)
Glucose-Capillary: 124 mg/dL — ABNORMAL HIGH (ref 70–99)
Glucose-Capillary: 95 mg/dL (ref 70–99)

## 2021-08-17 LAB — CBC
HCT: 26 % — ABNORMAL LOW (ref 36.0–46.0)
Hemoglobin: 8.2 g/dL — ABNORMAL LOW (ref 12.0–15.0)
MCH: 24.8 pg — ABNORMAL LOW (ref 26.0–34.0)
MCHC: 31.5 g/dL (ref 30.0–36.0)
MCV: 78.5 fL — ABNORMAL LOW (ref 80.0–100.0)
Platelets: 158 10*3/uL (ref 150–400)
RBC: 3.31 MIL/uL — ABNORMAL LOW (ref 3.87–5.11)
RDW: 18.9 % — ABNORMAL HIGH (ref 11.5–15.5)
WBC: 5.6 10*3/uL (ref 4.0–10.5)
nRBC: 0 % (ref 0.0–0.2)

## 2021-08-17 LAB — CREATININE, SERUM
Creatinine, Ser: 0.42 mg/dL — ABNORMAL LOW (ref 0.44–1.00)
GFR, Estimated: >60 mL/min (ref >60)

## 2021-08-17 LAB — ECHOCARDIOGRAM COMPLETE
Area-P 1/2: 3.42 cm2
Calc EF: 49.6 %
Height: 63 in
MV M vel: 5.73 m/s
MV Peak grad: 131.3 mmHg
S' Lateral: 3.7 cm
Single Plane A2C EF: 52.3 %
Single Plane A4C EF: 45.6 %
Weight: 3200 oz

## 2021-08-17 LAB — BASIC METABOLIC PANEL
Anion gap: 9 (ref 5–15)
BUN: 7 mg/dL (ref 6–20)
CO2: 21 mmol/L — ABNORMAL LOW (ref 22–32)
Calcium: 8.4 mg/dL — ABNORMAL LOW (ref 8.9–10.3)
Chloride: 107 mmol/L (ref 98–111)
Creatinine, Ser: 0.55 mg/dL (ref 0.44–1.00)
GFR, Estimated: >60 mL/min (ref >60)
Glucose, Bld: 129 mg/dL — ABNORMAL HIGH (ref 70–99)
Potassium: 4.1 mmol/L (ref 3.5–5.1)
Sodium: 137 mmol/L (ref 135–145)

## 2021-08-17 LAB — LACTIC ACID, PLASMA: Lactic Acid, Venous: 1.9 mmol/L (ref 0.5–1.9)

## 2021-08-17 LAB — TRIGLYCERIDES
Triglycerides: 3536 mg/dL — ABNORMAL HIGH (ref <150)
Triglycerides: 45 mg/dL (ref <150)

## 2021-08-17 LAB — TROPONIN I (HIGH SENSITIVITY): Troponin I (High Sensitivity): 55 ng/L — ABNORMAL HIGH (ref <18)

## 2021-08-17 LAB — MAGNESIUM: Magnesium: 1.5 mg/dL — ABNORMAL LOW (ref 1.7–2.4)

## 2021-08-17 MED ORDER — PROPOFOL 1000 MG/100ML IV EMUL
0.0000 ug/kg/min | INTRAVENOUS | Status: DC
  Administered 2021-08-17: 5 ug/kg/min via INTRAVENOUS
  Administered 2021-08-17: 25 ug/kg/min via INTRAVENOUS
  Administered 2021-08-18 (×2): 40 ug/kg/min via INTRAVENOUS
  Administered 2021-08-18: 30 ug/kg/min via INTRAVENOUS
  Administered 2021-08-18: 20 ug/kg/min via INTRAVENOUS
  Administered 2021-08-19 – 2021-08-22 (×19): 50 ug/kg/min via INTRAVENOUS
  Administered 2021-08-22: 30 ug/kg/min via INTRAVENOUS
  Administered 2021-08-22: 50 ug/kg/min via INTRAVENOUS
  Administered 2021-08-22: 20 ug/kg/min via INTRAVENOUS
  Administered 2021-08-23: 30 ug/kg/min via INTRAVENOUS
  Administered 2021-08-23: 40 ug/kg/min via INTRAVENOUS
  Administered 2021-08-23: 20 ug/kg/min via INTRAVENOUS
  Administered 2021-08-23 – 2021-08-25 (×8): 40 ug/kg/min via INTRAVENOUS
  Administered 2021-08-25: 10 ug/kg/min via INTRAVENOUS
  Administered 2021-08-25: 40 ug/kg/min via INTRAVENOUS
  Administered 2021-08-26: 30 ug/kg/min via INTRAVENOUS
  Administered 2021-08-26: 25 ug/kg/min via INTRAVENOUS
  Administered 2021-08-26: 35 ug/kg/min via INTRAVENOUS
  Administered 2021-08-27: 30 ug/kg/min via INTRAVENOUS
  Administered 2021-08-27: 35 ug/kg/min via INTRAVENOUS
  Administered 2021-08-27 (×2): 30 ug/kg/min via INTRAVENOUS
  Administered 2021-08-28 (×3): 35 ug/kg/min via INTRAVENOUS
  Administered 2021-08-28: 30 ug/kg/min via INTRAVENOUS
  Administered 2021-08-28: 35 ug/kg/min via INTRAVENOUS
  Administered 2021-08-29: 30 ug/kg/min via INTRAVENOUS
  Administered 2021-08-29 (×2): 45 ug/kg/min via INTRAVENOUS
  Administered 2021-08-29 – 2021-08-30 (×7): 35 ug/kg/min via INTRAVENOUS
  Administered 2021-08-31: 40 ug/kg/min via INTRAVENOUS
  Administered 2021-08-31: 35 ug/kg/min via INTRAVENOUS
  Administered 2021-08-31: 40 ug/kg/min via INTRAVENOUS
  Filled 2021-08-17 (×14): qty 100
  Filled 2021-08-17 (×2): qty 200
  Filled 2021-08-17 (×8): qty 100
  Filled 2021-08-17: qty 200
  Filled 2021-08-17 (×10): qty 100
  Filled 2021-08-17: qty 200
  Filled 2021-08-17 (×14): qty 100
  Filled 2021-08-17: qty 300
  Filled 2021-08-17 (×6): qty 100
  Filled 2021-08-17 (×2): qty 200
  Filled 2021-08-17 (×4): qty 100

## 2021-08-17 MED ORDER — VITAL HIGH PROTEIN PO LIQD
1000.0000 mL | ORAL | Status: DC
  Administered 2021-08-17: 1000 mL

## 2021-08-17 MED ORDER — PROSOURCE TF PO LIQD
45.0000 mL | Freq: Two times a day (BID) | ORAL | Status: DC
  Administered 2021-08-17 – 2021-08-18 (×3): 45 mL
  Filled 2021-08-17 (×3): qty 45

## 2021-08-17 MED ORDER — SODIUM CHLORIDE 0.9% FLUSH
10.0000 mL | Freq: Two times a day (BID) | INTRAVENOUS | Status: DC
  Administered 2021-08-17 – 2021-08-28 (×21): 10 mL
  Administered 2021-08-29: 40 mL
  Administered 2021-08-29: 10 mL
  Administered 2021-08-30: 40 mL
  Administered 2021-08-30: 10 mL
  Administered 2021-08-31: 40 mL

## 2021-08-17 MED ORDER — SODIUM CHLORIDE 0.9% FLUSH: 10.0000 mL | INTRAVENOUS | Status: DC | PRN

## 2021-08-17 NOTE — Progress Notes (Signed)
EEG complete - results pending 

## 2021-08-17 NOTE — Progress Notes (Signed)
Echocardiogram ?2D Echocardiogram has been performed. ? ?Warren Lacy Sahar Ryback RDCS ?08/17/2021, 8:59 AM ?

## 2021-08-17 NOTE — Progress Notes (Signed)
Peripherally Inserted Central Catheter Placement ? ?The IV Nurse has discussed with the patient and/or persons authorized to consent for the patient, the purpose of this procedure and the potential benefits and risks involved with this procedure.  The benefits include less needle sticks, lab draws from the catheter, and the patient may be discharged home with the catheter. Risks include, but not limited to, infection, bleeding, blood clot (thrombus formation), and puncture of an artery; nerve damage and irregular heartbeat and possibility to perform a PICC exchange if needed/ordered by physician.  Alternatives to this procedure were also discussed.  Bard Power PICC patient education guide, fact sheet on infection prevention and patient information card has been provided to patient /or left at bedside.  Telephone consent obtained from father. ? ?PICC Placement Documentation  ?PICC Triple Lumen 08/17/21 Right Basilic 40 cm 2 cm (Active)  ?Indication for Insertion or Continuance of Line Vasoactive infusions;Chronic illness with exacerbations (CF, Sickle Cell, etc.);Prolonged intravenous therapies 08/17/21 1226  ?Exposed Catheter (cm) 2 cm 08/17/21 1226  ?Site Assessment Clean, Dry, Intact 08/17/21 1226  ?Lumen #1 Status Flushed;Saline locked;Blood return noted 08/17/21 1226  ?Lumen #2 Status Flushed;Saline locked;Blood return noted 08/17/21 1226  ?Lumen #3 Status Flushed;Saline locked;Blood return noted 08/17/21 1226  ?Dressing Type Securing device;Transparent 08/17/21 1226  ?Dressing Status Antimicrobial disc in place;Clean, Dry, Intact 08/17/21 1226  ?Safety Lock Not Applicable 08/17/21 1226  ?Line Care Connections checked and tightened 08/17/21 1226  ?Line Adjustment (NICU/IV Team Only) No 08/17/21 1226  ?Dressing Intervention New dressing 08/17/21 1226  ?Dressing Change Due 08/24/21 08/17/21 1226  ? ? ? ? ? ?Elliot Dally ?08/17/2021, 12:27 PM ? ?

## 2021-08-17 NOTE — Procedures (Addendum)
Patient Name: Vicki Moore  ?MRN: [id number]  ?Epilepsy Attending: Charlsie Quest  ?Referring Physician/Provider: Lorin Glass, MD ?Date: 08/17/2021 ?Duration: 25.12 mins ? ?Patient history: 41yo F s/p cardiac arrest. EEG to evaluate for seizure ? ?Level of alertness:  comatose ? ?AEDs during EEG study: LEV, propofol ? ?Technical aspects: This EEG study was done with scalp electrodes positioned according to the 10-20 International system of electrode placement. Electrical activity was acquired at a sampling rate of 500Hz  and reviewed with a high frequency filter of 70Hz  and a low frequency filter of 1Hz . EEG data were recorded continuously and digitally stored.  ? ?Description: EEG showed continuous generalized background suppression which was not reactive to noxious stimulation. Hyperventilation and photic stimulation were not performed.    ? ?ABNORMALITY ?- Background suppression , generalized ? ?IMPRESSION: ?This study is suggestive of profound diffuse encephalopathy, nonspecific etiology but likely related to sedation, anoxic/hypoxic brain injury. No seizures or epileptiform discharges were seen throughout the recording. ? ?  ? ?

## 2021-08-17 NOTE — Progress Notes (Signed)
LTM EEG hooked up and running - no initial skin breakdown - push button tested - neuro notified. [facility] monitoring.  

## 2021-08-17 NOTE — Progress Notes (Signed)
CALLED PARENTS AND OBTAINED TELEPHONE CONSENT FOR PICC LINE PLACEMENT. THERESA RIGGS,RN WAS A SECOND WITNESS. ?

## 2021-08-17 NOTE — Progress Notes (Signed)
? ?  NAME:  Vicki Moore, MRN:  [id number], DOB:  1980-10-17, LOS: 1 ?ADMISSION DATE:  09/06/2021, CONSULTATION DATE:  2021/09/06 ?REFERRING MD:  Dr. Charm Barges, CHIEF COMPLAINT:  Cardiac arrest   ? ?History of Present Illness:  ?Vicki Moore is a 41 y.o. with a PMH WPW, Depression, anxiety, asthma and polysubstance abuse who presented to the ED via EMS after being found unresponsive by child. Per report patient was with 10yo son when she went to the bathroom and child heard a loud thud and patient was found unresponsive eon floor. On EMS arrival patient was found in PEA arrest, ACLS protocol ran and king airway placed. Unknown if patient received bystandard CPR. time between call and EMS arrival.  ? ?On ED arrival patient was seen minimally responsive (non-purposeful movement to upper extremities) with mild hypothermia and mild tachycardia. Lab work significant for K 3.0, CO2 12, Alkaline phos 172, AST 390, ALT 228, lactic 7.1, and hgb 10.5. CXR with left lung base opacities concerning for aspiration. PCCM consulted for further management and admission.  ? ?Pertinent  Medical History  ?WPW, Depression, anxiety, asthma and polysubstance abuse  ? ?Significant Hospital Events: ?Including procedures, antibiotic start and stop dates in addition to other pertinent events   ?5/6 admitted after out of hospital cardiac arrest  ? ?Interim History / Subjective:  ?Myoclonus overnight. ?Comatose otherwise ? ?Objective   ?Blood pressure (!) 139/104, pulse (!) 58, temperature 98.8 ?F (37.1 ?C), resp. rate 14, height 5\' 3"  (1.6 m), weight 90.7 kg, SpO2 100 %. ?   ?Vent Mode: PRVC ?FiO2 (%):  [40 %-100 %] 40 % ?Set Rate:  [16 bmp] 16 bmp ?Vt Set:  [410 mL] 410 mL ?PEEP:  [5 cmH20] 5 cmH20 ?Plateau Pressure:  [15 cmH20-27 cmH20] 18 cmH20  ? ?Intake/Output Summary (Last 24 hours) at 08/17/2021 0924 ?Last data filed at 08/17/2021 0600 ?Gross per 24 hour  ?Intake 815.26 ml  ?Output 1240 ml  ?Net -424.74 ml  ? ?Filed Weights  ?  Sep 06, 2021 1806  ?Weight: 90.7 kg  ? ? ?Examination: ?Ill appearing woman lying in bed ?Pupils reactive, corneals present ?Lungs with rhonci worse on R ?+ cough/gag ?Not triggering vent ?Abdomen soft ? ?TG 3500?! ?BMP pending ? ?Resolved Hospital Problem list   ? ? 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 ?History of WPW- tele benign to date ?Acute liver injury post arrest- labs today pending ?Hypokalemia- BMP pending ? ?- Vent bundle, lung protective tidal volumes ?- Ceftriaxone x 5 days ?- TTM ?- LTVEEG ?- f/u AM LFTs, BMP, echo ?- Parents to come in today, will update when they do ?- Guarded prognosis ? ?Best Practice (right click and "Reselect all SmartList Selections" daily)  ? ?Diet/type: Start TF ?DVT prophylaxis: prophylactic heparin  ?GI prophylaxis: PPI ?Lines: N/A ?Foley:  Yes, and it is still needed ?Code Status:  full code ?Last date of multidisciplinary goals of care discussion: full code pending clinical course ? ?31 min cc time ?10/16/21 MD PCCM ? ? ?

## 2021-08-18 ENCOUNTER — Inpatient Hospital Stay (HOSPITAL_COMMUNITY): Payer: Medicaid Other

## 2021-08-18 DIAGNOSIS — I469 Cardiac arrest, cause unspecified: Secondary | ICD-10-CM | POA: Diagnosis not present

## 2021-08-18 LAB — BASIC METABOLIC PANEL
Anion gap: 7 (ref 5–15)
BUN: 6 mg/dL (ref 6–20)
CO2: 23 mmol/L (ref 22–32)
Calcium: 7.8 mg/dL — ABNORMAL LOW (ref 8.9–10.3)
Chloride: 104 mmol/L (ref 98–111)
Creatinine, Ser: 0.42 mg/dL — ABNORMAL LOW (ref 0.44–1.00)
GFR, Estimated: >60 mL/min (ref >60)
Glucose, Bld: 111 mg/dL — ABNORMAL HIGH (ref 70–99)
Potassium: 3.3 mmol/L — ABNORMAL LOW (ref 3.5–5.1)
Sodium: 134 mmol/L — ABNORMAL LOW (ref 135–145)

## 2021-08-18 LAB — GLUCOSE, CAPILLARY
Glucose-Capillary: 100 mg/dL — ABNORMAL HIGH (ref 70–99)
Glucose-Capillary: 106 mg/dL — ABNORMAL HIGH (ref 70–99)
Glucose-Capillary: 106 mg/dL — ABNORMAL HIGH (ref 70–99)
Glucose-Capillary: 110 mg/dL — ABNORMAL HIGH (ref 70–99)
Glucose-Capillary: 120 mg/dL — ABNORMAL HIGH (ref 70–99)
Glucose-Capillary: 36 mg/dL — CL (ref 70–99)
Glucose-Capillary: 95 mg/dL (ref 70–99)

## 2021-08-18 LAB — PHOSPHORUS: Phosphorus: 2.4 mg/dL — ABNORMAL LOW (ref 2.5–4.6)

## 2021-08-18 LAB — CBC
HCT: 30.5 % — ABNORMAL LOW (ref 36.0–46.0)
Hemoglobin: 9.4 g/dL — ABNORMAL LOW (ref 12.0–15.0)
MCH: 23.6 pg — ABNORMAL LOW (ref 26.0–34.0)
MCHC: 30.8 g/dL (ref 30.0–36.0)
MCV: 76.6 fL — ABNORMAL LOW (ref 80.0–100.0)
Platelets: 224 10*3/uL (ref 150–400)
RBC: 3.98 MIL/uL (ref 3.87–5.11)
RDW: 19 % — ABNORMAL HIGH (ref 11.5–15.5)
WBC: 7.2 10*3/uL (ref 4.0–10.5)
nRBC: 0 % (ref 0.0–0.2)

## 2021-08-18 LAB — HEMOGLOBIN A1C
Hgb A1c MFr Bld: 5.3 % (ref 4.8–5.6)
Mean Plasma Glucose: 105 mg/dL

## 2021-08-18 LAB — HEPATIC FUNCTION PANEL
ALT: 111 U/L — ABNORMAL HIGH (ref 0–44)
AST: 83 U/L — ABNORMAL HIGH (ref 15–41)
Albumin: 2.2 g/dL — ABNORMAL LOW (ref 3.5–5.0)
Alkaline Phosphatase: 118 U/L (ref 38–126)
Bilirubin, Direct: 0.1 mg/dL (ref 0.0–0.2)
Indirect Bilirubin: 0.5 mg/dL (ref 0.3–0.9)
Total Bilirubin: 0.6 mg/dL (ref 0.3–1.2)
Total Protein: 5.1 g/dL — ABNORMAL LOW (ref 6.5–8.1)

## 2021-08-18 LAB — MAGNESIUM: Magnesium: 1.6 mg/dL — ABNORMAL LOW (ref 1.7–2.4)

## 2021-08-18 MED ORDER — HEPARIN SODIUM (PORCINE) 5000 UNIT/ML IJ SOLN: 5000.0000 [IU] | Freq: Three times a day (TID) | INTRAMUSCULAR | Status: DC

## 2021-08-18 MED ORDER — VITAL AF 1.2 CAL PO LIQD
1000.0000 mL | ORAL | Status: DC
  Administered 2021-08-18 – 2021-08-29 (×10): 1000 mL

## 2021-08-18 MED ORDER — MAGNESIUM SULFATE 4 GM/100ML IV SOLN
4.0000 g | Freq: Once | INTRAVENOUS | Status: AC
  Administered 2021-08-18: 4 g via INTRAVENOUS
  Filled 2021-08-18: qty 100

## 2021-08-18 MED ORDER — POTASSIUM CHLORIDE 20 MEQ PO PACK
20.0000 meq | PACK | ORAL | Status: AC
  Administered 2021-08-18 (×2): 20 meq
  Filled 2021-08-18 (×2): qty 1

## 2021-08-18 MED ORDER — INSULIN ASPART 100 UNIT/ML IJ SOLN: 0.0000 [IU] | INTRAMUSCULAR | Status: DC

## 2021-08-18 MED ORDER — POTASSIUM CHLORIDE 10 MEQ/50ML IV SOLN
10.0000 meq | INTRAVENOUS | Status: AC
  Administered 2021-08-18 (×4): 10 meq via INTRAVENOUS
  Filled 2021-08-18 (×4): qty 50

## 2021-08-18 NOTE — Procedures (Addendum)
Patient Name: Vicki Moore  ?MRN: [id number]  ?Epilepsy Attending: Charlsie Quest  ?Referring Physician/Provider: Lorin Glass, MD ?Duration: 08/17/2021 1423 to 08/18/2021 1423 ?  ?Patient history: 41yo F s/p cardiac arrest. EEG to evaluate for seizure ?  ?Level of alertness:  comatose ?  ?AEDs during EEG study: LEV, propofol ?  ?Technical aspects: This EEG study was done with scalp electrodes positioned according to the 10-20 International system of electrode placement. Electrical activity was acquired at a sampling rate of 500Hz  and reviewed with a high frequency filter of 70Hz  and a low frequency filter of 1Hz . EEG data were recorded continuously and digitally stored.  ?  ?Description: EEG showed continuous generalized 3-5Hz  theta-delta slowing which at times appear sharply contoured. Hyperventilation and photic stimulation were not performed.    ?  ?ABNORMALITY ?- Continuous slow, generalized ?  ?IMPRESSION: ?This study is suggestive of severe diffuse encephalopathy, nonspecific etiology but likely related to sedation, anoxic/hypoxic brain injury. No seizures or definite epileptiform discharges were seen throughout the recording. ?  ?  ? ? ? ? ? ? ? ? ? ? ? ? ? ? ?

## 2021-08-18 NOTE — Progress Notes (Signed)
Critical low CBG of 36 reported by nurse tech. Redrawn from PICC and resulted at 120. Please ignore critical low. Edit sheet sent to [facility] of Care. ?

## 2021-08-18 NOTE — Progress Notes (Signed)
[facility] ADULT ICU REPLACEMENT PROTOCOL ? ? ?The patient does apply for the [facility] Adult ICU Electrolyte Replacment Protocol based on the criteria listed below:  ? ?1.Exclusion criteria: TCTS patients, ECMO patients, and Dialysis patients ?2. Is GFR >/= 30 ml/min? Yes.    ?Patient's GFR today is >60 ?3. Is SCr </= 2? Yes.   ?Patient's SCr is 0.42 mg/dL ?4. Did SCr increase >/= 0.5 in 24 hours? No. ?5.Pt's weight >40kg  Yes.   ?6. Abnormal electrolyte(s): K+3.3, Mag 1.6  ?7. Electrolytes replaced per protocol ?8.  Call MD STAT for K+ </= 2.5, Phos </= 1, or Mag </= 1 ?Physician:  Dr. Prudencio Burly ? ?Vicki Moore 08/18/2021 4:17 AM  ?

## 2021-08-18 NOTE — Progress Notes (Signed)
MD made aware of increasing temperature. Orders received to initiate 37 degree targeted temperature management protocol with [place] Foil. ?

## 2021-08-18 NOTE — Progress Notes (Addendum)
Initial Nutrition Assessment ? ?DOCUMENTATION CODES:  ? ?Not applicable ? ?INTERVENTION:  ? ?Tube Feeding via OG:  ?Vital AF 1.2 at 60 ml/hr ?This provides 1728 kcals, 108 g of protein and 1166 mL of free water ? ?NUTRITION DIAGNOSIS:  ? ?Inadequate oral intake related to acute illness as evidenced by NPO status. ? ?GOAL:  ? ?Patient will meet greater than or equal to 90% of their needs ? ?MONITOR:  ? ?TF tolerance, Vent status, Labs, Weight trends ? ?REASON FOR ASSESSMENT:  ? ?Ventilator, Consult ?Enteral/tube feeding initiation and management ? ?ASSESSMENT:  ? ?41 yo female admitted post cardiac arrest, acute respiratory failure with aspiration pneumonia-intubated, anoxic myoclonus. UDS +benzos. PMH includes polysubstance abuse, depression, anxiety, WPW ? ?5/06 Admitted, Intubated, TTM ? ?Pt remains on vent support ?Propofol: OFF currently ?EEG no seizures, severe diffuse encephalopathy ? ?Adult TF protocol initiated yesterday, tolerating Vital High Protein at 40 ml/hr ? ?Unable to obtain diet and weight history from pt at this time ? ?Per RN, hypoglycemia value inaccurate this AM ? ?Labs: CBGs 36-124, potassium 3.3 (L), phosphorus 2.4 (L), magnesium 1.6 (L) ?Meds: ss novolog, miralax, colace, potassium chloride, mag sulfate ? ? ?Diet Order:   ?Diet Order   ? ? None  ? ?  ? ? ?EDUCATION NEEDS:  ? ?Not appropriate for education at this time ? ?Skin:  Skin Assessment: Reviewed RN Assessment ? ?Last BM:  PTA ? ?Height:  ? ?Ht Readings from Last 1 Encounters:  ?08/19/2021 5\' 3"  (1.6 m)  ? ? ?Weight:  ? ?Wt Readings from Last 1 Encounters:  ?09/03/2021 90.7 kg  ? ? ? ?BMI:  Body mass index is 35.43 kg/m?. ? ?Estimated Nutritional Needs:  ? ?Kcal:  1600-1800 kcals ? ?Protein:  105-130 g ? ?Fluid:  >/= 1.8 L ? ? ? ?Kerman Passey MS, RDN, LDN, CNSC ?Registered Dietitian III ?Clinical Nutrition ?RD Pager and On-Call Pager Number Located in [place]  ? ?

## 2021-08-18 NOTE — Progress Notes (Signed)
Inpatient Diabetes Program Recommendations ? ?AACE/ADA: New Consensus Statement on Inpatient Glycemic Control (2015) ? ?Target Ranges:  Prepandial:   less than 140 mg/dL ?     Peak postprandial:   less than 180 mg/dL (1-2 hours) ?     Critically ill patients:  140 - 180 mg/dL  ? ?Lab Results  ?Component Value Date  ? GLUCAP 120 (H) 08/18/2021  ? HGBA1C 5.2 11/13/2019  ? ? ?Review of Glycemic Control ? Latest Reference Range & Units 08/18/21 07:30 08/18/21 11:28 08/18/21 11:31  ?Glucose-Capillary 70 - 99 mg/dL 623 (H) 36 (LL) 762 (H)  ?(LL): Data is critically low ?(H): Data is abnormally high ? ?Diabetes history: No hx DM noted ?Current orders for Inpatient glycemic control: Novolog 0-15 units q 4 hrs. ? ?Inpatient Diabetes Program Recommendations:   ?-Decrease Novolog correction to 0-6 units q 4 hrs. ?Secure chat to Dr. Katrinka Blazing. ? ?Thank you, ?Billy Fischer Erhardt Dada, RN, MSN, CDE  ?Diabetes Coordinator ?Inpatient Glycemic Control Team ?Team Pager [phone] (8am-5pm) ?08/18/2021 1:12 PM ? ? ? ? ?

## 2021-08-18 NOTE — Progress Notes (Signed)
? ?NAME:  Vicki Moore, MRN:  [id number], DOB:  31-Jan-1981, LOS: 2 ?ADMISSION DATE:  08/14/2021, CONSULTATION DATE:  08/17/2021 ?REFERRING MD:  Dr. Melina Copa, CHIEF COMPLAINT:  Cardiac arrest   ? ?History of Present Illness:  ?Vicki Moore is a 41 y.o. with a PMH WPW, Depression, anxiety, asthma and polysubstance abuse who presented to the ED via EMS after being found unresponsive by child. Per report patient was with 10yo son when she went to the bathroom and child heard a loud thud and patient was found unresponsive eon floor. On EMS arrival patient was found in PEA arrest, ACLS protocol ran and king airway placed. Unknown if patient received bystandard CPR. 74min time between call and EMS arrival.  ? ?On ED arrival patient was seen minimally responsive (non-purposeful movement to upper extremities) with mild hypothermia and mild tachycardia. Lab work significant for K 3.0, CO2 12, Alkaline phos 172, AST 390, ALT 228, lactic 7.1, and hgb 10.5. CXR with left lung base opacities concerning for aspiration. PCCM consulted for further management and admission.  ? ?Pertinent  Medical History  ?WPW, Depression, anxiety, asthma and polysubstance abuse  ? ?Significant Hospital Events: ?Including procedures, antibiotic start and stop dates in addition to other pertinent events   ?5/6 admitted after out of hospital cardiac arrest  ? ?Interim History / Subjective:  ?PICC placed yesterday. ?Myoclonus ablated with propofol. ? ?Objective   ?Blood pressure (!) 134/95, pulse 95, temperature 99.3 ?F (37.4 ?C), resp. rate 16, height 5\' 3"  (1.6 m), weight 90.7 kg, SpO2 100 %. ?   ?Vent Mode: PRVC ?FiO2 (%):  [40 %] 40 % ?Set Rate:  [16 bmp] 16 bmp ?Vt Set:  [410 mL] 410 mL ?PEEP:  [5 cmH20] 5 cmH20 ?Plateau Pressure:  [14 cmH20-15 cmH20] 15 cmH20  ? ?Intake/Output Summary (Last 24 hours) at 08/18/2021 0744 ?Last data filed at 08/18/2021 0600 ?Gross per 24 hour  ?Intake 3678.64 ml  ?Output 1575 ml  ?Net 2103.64 ml  ? ? ?Filed Weights   ? 08/30/2021 1806  ?Weight: 90.7 kg  ? ? ?Examination: ?Ill appearing woman lying in bed ?Pupils reactive, corneals present ?+cough, triggering vent, +gag ?Lungs with rhonci worse on R ?No response to pain with current sedation regimen ? ?TG recheck normal ?Mg/K repleted ?Shock liver improved ?Cr normal ?CXR small L effusion ? ?Resolved Hospital Problem list   ? ? 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  History of abusing this. ?History of WPW- tele benign to date. Do not think this is proximal cause of arrest. ?Acute liver injury post arrest- labs today pending ?Hypokalemia- BMP pending ? ?- Vent bundle, lung protective tidal volumes ?- Lighten sedation today ?- If not waking up tomorrow evening will get MRI ?- Ceftriaxone x 5 days ?- TTM ?- LTVEEG ?- Will update parents when they come in ?- Guarded prognosis ? ?Best Practice (right click and "Reselect all SmartList Selections" daily)  ? ?Diet/type: Start TF ?DVT prophylaxis: prophylactic heparin  ?GI prophylaxis: PPI ?Lines: N/A ?Foley:  Yes, and it is still needed ?Code Status:  full code ?Last date of multidisciplinary goals of care discussion: full code pending clinical course ? ?34 min  cc time ?Erskine Emery MD PCCM ? ? ?

## 2021-08-18 NOTE — Progress Notes (Signed)
LTM maint complete 

## 2021-08-18 NOTE — Progress Notes (Signed)
LTM maint complete - no skin breakdown under: Fp1 F3F7 .[facility] monitored, Event button test confirmed by [facility].  

## 2021-08-18 NOTE — Progress Notes (Signed)
CSW acknowledges consult for substance use for patient. CSW following to complete when appropriate. CSW will continue to follow. ?

## 2021-08-19 ENCOUNTER — Inpatient Hospital Stay (HOSPITAL_COMMUNITY): Payer: Medicaid Other

## 2021-08-19 DIAGNOSIS — I469 Cardiac arrest, cause unspecified: Secondary | ICD-10-CM | POA: Diagnosis not present

## 2021-08-19 LAB — BASIC METABOLIC PANEL
Anion gap: 6 (ref 5–15)
BUN: <5 mg/dL — ABNORMAL LOW (ref 6–20)
CO2: 24 mmol/L (ref 22–32)
Calcium: 7.8 mg/dL — ABNORMAL LOW (ref 8.9–10.3)
Chloride: 104 mmol/L (ref 98–111)
Creatinine, Ser: <0.3 mg/dL — ABNORMAL LOW (ref 0.44–1.00)
Glucose, Bld: 109 mg/dL — ABNORMAL HIGH (ref 70–99)
Potassium: 3.5 mmol/L (ref 3.5–5.1)
Sodium: 134 mmol/L — ABNORMAL LOW (ref 135–145)

## 2021-08-19 LAB — CBC
HCT: 29.2 % — ABNORMAL LOW (ref 36.0–46.0)
Hemoglobin: 9.3 g/dL — ABNORMAL LOW (ref 12.0–15.0)
MCH: 24.2 pg — ABNORMAL LOW (ref 26.0–34.0)
MCHC: 31.8 g/dL (ref 30.0–36.0)
MCV: 76 fL — ABNORMAL LOW (ref 80.0–100.0)
Platelets: 242 10*3/uL (ref 150–400)
RBC: 3.84 MIL/uL — ABNORMAL LOW (ref 3.87–5.11)
RDW: 19.1 % — ABNORMAL HIGH (ref 11.5–15.5)
WBC: 9 10*3/uL (ref 4.0–10.5)
nRBC: 0 % (ref 0.0–0.2)

## 2021-08-19 LAB — GLUCOSE, CAPILLARY
Glucose-Capillary: 103 mg/dL — ABNORMAL HIGH (ref 70–99)
Glucose-Capillary: 78 mg/dL (ref 70–99)
Glucose-Capillary: 96 mg/dL (ref 70–99)
Glucose-Capillary: 92 mg/dL (ref 70–99)

## 2021-08-19 LAB — HEPATIC FUNCTION PANEL
ALT: 74 U/L — ABNORMAL HIGH (ref 0–44)
AST: 43 U/L — ABNORMAL HIGH (ref 15–41)
Albumin: 2.1 g/dL — ABNORMAL LOW (ref 3.5–5.0)
Alkaline Phosphatase: 108 U/L (ref 38–126)
Bilirubin, Direct: <0.1 mg/dL (ref 0.0–0.2)
Total Bilirubin: 0.6 mg/dL (ref 0.3–1.2)
Total Protein: 5.4 g/dL — ABNORMAL LOW (ref 6.5–8.1)

## 2021-08-19 LAB — MAGNESIUM: Magnesium: 1.7 mg/dL (ref 1.7–2.4)

## 2021-08-19 LAB — PHOSPHORUS: Phosphorus: 2.7 mg/dL (ref 2.5–4.6)

## 2021-08-19 MED ORDER — MAGNESIUM SULFATE 2 GM/50ML IV SOLN
2.0000 g | Freq: Once | INTRAVENOUS | Status: AC
  Administered 2021-08-19: 2 g via INTRAVENOUS
  Filled 2021-08-19: qty 50

## 2021-08-19 MED ORDER — POTASSIUM CHLORIDE 10 MEQ/50ML IV SOLN
10.0000 meq | INTRAVENOUS | Status: AC
  Administered 2021-08-19 (×3): 10 meq via INTRAVENOUS
  Filled 2021-08-19: qty 50

## 2021-08-19 NOTE — Progress Notes (Signed)
Chaplain responded to request from pt's nurse to visit pt's mother and dad and her 40 year old son at bedside.  Message was grandparents not sure how to tell their 10-year grandson his mother is likely going to die; worried about how to support him at such a tender age when he is likely traumatized by finding his mother unresponsive in the bathroom. After talking with the mother and dad briefly, Chaplain learned the primary concern was that their daughter receive "last rites" as they are devout Catholics.  Pt's mother told story of when her brother died the priest arrived too late to administer "last rites" and told her as a result the brother would not go to [place].  While the mother denied believing this was true (which the Chaplain endorsed) she was concerned (almost frantic?) that her daughter be anointed even it was by a Social worker and not a Forensic psychologist.  Bonney Roussel performed [employer] rite of anointing at the time of death at the pt's bedside with mother, father and son present.  Mother spoke of how strong their faith was and how thankful she was her daughter had received the sacrament.  Chaplain assured her because of her faith she will know when the time is right and what to say to her 59 year old grandson.  She took a deep breath and acknowledged she was now ready to help her grandson manage the loss of his mother, with God's help. ? ?De Burrs ?Chaplain ?

## 2021-08-19 NOTE — Progress Notes (Signed)
? ?NAME:  Vicki Moore, MRN:  [id number], DOB:  [birth year], LOS: 3 ?ADMISSION DATE:  08/18/2021, CONSULTATION DATE:  09/05/2021 ?REFERRING MD:  Dr. Melina Copa, CHIEF COMPLAINT:  Cardiac arrest   ? ?History of Present Illness:  ?Vicki Moore is a 41 y.o. with a PMH WPW, Depression, anxiety, asthma and polysubstance abuse who presented to the ED via EMS after being found unresponsive by child. Per report patient was with 10yo son when she went to the bathroom and child heard a loud thud and patient was found unresponsive eon floor. On EMS arrival patient was found in PEA arrest, ACLS protocol ran and king airway placed. Unknown if patient received bystandard CPR. 31min time between call and EMS arrival.  ? ?On ED arrival patient was seen minimally responsive (non-purposeful movement to upper extremities) with mild hypothermia and mild tachycardia. Lab work significant for K 3.0, CO2 12, Alkaline phos 172, AST 390, ALT 228, lactic 7.1, and hgb 10.5. CXR with left lung base opacities concerning for aspiration. PCCM consulted for further management and admission.  ? ?Pertinent  Medical History  ?WPW, Depression, anxiety, asthma and polysubstance abuse  ? ?Significant Hospital Events: ?Including procedures, antibiotic start and stop dates in addition to other pertinent events   ?5/6 admitted after out of hospital cardiac arrest  ? ?Interim History / Subjective:  ?Myoclonus with sedation wean. ?On arctic sun. ? ?Objective   ?Blood pressure (!) 133/103, pulse (!) 111, temperature 99 ?F (37.2 ?C), temperature source Bladder, resp. rate (!) 25, height 5\' 3"  (1.6 m), weight 90.7 kg, SpO2 100 %. ?   ?Vent Mode: PSV;CPAP ?FiO2 (%):  [40 %] 40 % ?Set Rate:  [16 bmp] 16 bmp ?Vt Set:  [410 mL] 410 mL ?PEEP:  [5 cmH20] 5 cmH20 ?Pressure Support:  [10 cmH20] 10 cmH20 ?Plateau Pressure:  [12 cmH20-17 cmH20] 16 cmH20  ? ?Intake/Output Summary (Last 24 hours) at 08/19/2021 0932 ?Last data filed at 08/19/2021 0900 ?Gross per 24 hour   ?Intake 3556.19 ml  ?Output 2635 ml  ?Net 921.19 ml  ? ? ?Filed Weights  ? 09/01/2021 1806 08/19/21 0403  ?Weight: 90.7 kg 90.7 kg  ? ? ?Examination: ?Ill appearing woman lying in bed ?Pupils reactive, corneals present ?+cough, triggering vent, +gag ?Lungs with rhonci worse on R ?No response to pain with current sedation regimen ?Myoclonus R>L with any sedation wean ? ?K/Mg low being repleted ?LFTs improved ?CBC stable ? ?Resolved Hospital Problem list   ? ? ?Assessment & Plan:  ?Prolonged OOH PEA arrest- echo benign.  Benzo overdose seems most likely cause. 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  History of abusing this. ?History of WPW- tele benign to date. Do not think this is proximal cause of arrest. ?Acute liver injury post arrest- labs today pending ?Hypokalemia- BMP pending ? ?- Vent bundle, lung protective tidal volumes ?- MRI this evening, will be 72 hours post event ?- Ceftriaxone x 5 days ?- TTM ?- If not meaningfully waking up and MRI showing extensive damage will need GOC discussion ?- Grim prognosis ? ?Best Practice (right click and "Reselect all SmartList Selections" daily)  ? ?Diet/type: Start TF ?DVT prophylaxis: prophylactic heparin  ?GI prophylaxis: PPI ?Lines: N/A ?Foley:  Yes, and it is still needed ?Code Status:  full code ?Last date of multidisciplinary goals of  care discussion: 5/8, full code pending clinical course ? ?31 min cc time ?Erskine Emery MD PCCM ? ? ?

## 2021-08-19 NOTE — Plan of Care (Signed)
  Problem: Clinical Measurements: Goal: Ability to maintain clinical measurements within normal limits will improve Outcome: Progressing Goal: Will remain free from infection Outcome: Progressing Goal: Diagnostic test results will improve Outcome: Progressing Goal: Respiratory complications will improve Outcome: Progressing Goal: Cardiovascular complication will be avoided Outcome: Progressing   Problem: Nutrition: Goal: Adequate nutrition will be maintained Outcome: Progressing   Problem: Elimination: Goal: Will not experience complications related to bowel motility Outcome: Progressing Goal: Will not experience complications related to urinary retention Outcome: Progressing   

## 2021-08-19 NOTE — Progress Notes (Signed)
eLink Physician-Brief Progress Note ?Patient Name: Vicki Moore ?DOB: Sep 04, 1980 ?MRN: [id number] ? ? ?Date of Service ? 08/19/2021  ?HPI/Events of Note ? K+ 3.5, Mg++ 1.7.  ?eICU Interventions ? KCL 10 meq Q 1 hour x 3, Mg++ Sulfate 2 gm iv x 1.  ? ? ? ?  ? ?Vicki Moore ?08/19/2021, 7:04 AM ?

## 2021-08-19 NOTE — Procedures (Addendum)
Patient Name: Vicki Moore  ?MRN: [id number]  ?Epilepsy Attending: Charlsie Quest  ?Referring Physician/Provider: Lorin Glass, MD ?Duration: 08/18/2021 1423 to 08/19/2021 1011 ?  ?Patient history: 41yo F s/p cardiac arrest. EEG to evaluate for seizure ?  ?Level of alertness:  comatose ?  ?AEDs during EEG study: LEV, versed, propofol ?  ?Technical aspects: This EEG study was done with scalp electrodes positioned according to the 10-20 International system of electrode placement. Electrical activity was acquired at a sampling rate of 500Hz  and reviewed with a high frequency filter of 70Hz  and a low frequency filter of 1Hz . EEG data were recorded continuously and digitally stored.  ?  ?Description: EEG showed continuous generalized 3-5Hz  theta-delta slowing.  At times, EEG showed generalized spike and wave at 2-3hz  without definite evolution. Hyperventilation and photic stimulation were not performed.    ?  ?ABNORMALITY ?- Spike, generalized ?- Continuous slow, generalized ?  ?IMPRESSION: ?This study showed evidence of epileptogenicity with generalized onset as well as severe diffuse encephalopathy, nonspecific etiology but likely related to sedation, anoxic/hypoxic brain injury. No seizures were seen throughout the recording. ?  ?  ?

## 2021-08-19 NOTE — Progress Notes (Signed)
LTM EEG discontinued - no skin breakdown at unhook.   

## 2021-08-20 DIAGNOSIS — I469 Cardiac arrest, cause unspecified: Secondary | ICD-10-CM | POA: Diagnosis not present

## 2021-08-20 LAB — BASIC METABOLIC PANEL
Anion gap: 6 (ref 5–15)
BUN: 6 mg/dL (ref 6–20)
CO2: 26 mmol/L (ref 22–32)
Calcium: 8.2 mg/dL — ABNORMAL LOW (ref 8.9–10.3)
Chloride: 102 mmol/L (ref 98–111)
Creatinine, Ser: 0.35 mg/dL — ABNORMAL LOW (ref 0.44–1.00)
GFR, Estimated: >60 mL/min (ref >60)
Glucose, Bld: 109 mg/dL — ABNORMAL HIGH (ref 70–99)
Potassium: 4 mmol/L (ref 3.5–5.1)
Sodium: 134 mmol/L — ABNORMAL LOW (ref 135–145)

## 2021-08-20 LAB — GLUCOSE, CAPILLARY
Glucose-Capillary: 100 mg/dL — ABNORMAL HIGH (ref 70–99)
Glucose-Capillary: 132 mg/dL — ABNORMAL HIGH (ref 70–99)
Glucose-Capillary: 86 mg/dL (ref 70–99)
Glucose-Capillary: 88 mg/dL (ref 70–99)
Glucose-Capillary: 89 mg/dL (ref 70–99)
Glucose-Capillary: 93 mg/dL (ref 70–99)

## 2021-08-20 LAB — TRIGLYCERIDES: Triglycerides: 470 mg/dL — ABNORMAL HIGH (ref <150)

## 2021-08-20 LAB — CBC
HCT: 29.3 % — ABNORMAL LOW (ref 36.0–46.0)
Hemoglobin: 9.1 g/dL — ABNORMAL LOW (ref 12.0–15.0)
MCH: 23.9 pg — ABNORMAL LOW (ref 26.0–34.0)
MCHC: 31.1 g/dL (ref 30.0–36.0)
MCV: 76.9 fL — ABNORMAL LOW (ref 80.0–100.0)
Platelets: 274 10*3/uL (ref 150–400)
RBC: 3.81 MIL/uL — ABNORMAL LOW (ref 3.87–5.11)
RDW: 19.2 % — ABNORMAL HIGH (ref 11.5–15.5)
WBC: 8.2 10*3/uL (ref 4.0–10.5)
nRBC: 0 % (ref 0.0–0.2)

## 2021-08-20 LAB — HEPATIC FUNCTION PANEL
ALT: 53 U/L — ABNORMAL HIGH (ref 0–44)
AST: 34 U/L (ref 15–41)
Albumin: 2.1 g/dL — ABNORMAL LOW (ref 3.5–5.0)
Alkaline Phosphatase: 132 U/L — ABNORMAL HIGH (ref 38–126)
Bilirubin, Direct: 0.1 mg/dL (ref 0.0–0.2)
Indirect Bilirubin: 0.1 mg/dL — ABNORMAL LOW (ref 0.3–0.9)
Total Bilirubin: 0.2 mg/dL — ABNORMAL LOW (ref 0.3–1.2)
Total Protein: 5.8 g/dL — ABNORMAL LOW (ref 6.5–8.1)

## 2021-08-20 LAB — PHOSPHORUS: Phosphorus: 3.3 mg/dL (ref 2.5–4.6)

## 2021-08-20 LAB — MAGNESIUM: Magnesium: 1.6 mg/dL — ABNORMAL LOW (ref 1.7–2.4)

## 2021-08-20 MED ORDER — MAGNESIUM SULFATE 4 GM/100ML IV SOLN
4.0000 g | Freq: Once | INTRAVENOUS | Status: AC
  Administered 2021-08-20: 4 g via INTRAVENOUS
  Filled 2021-08-20: qty 100

## 2021-08-20 NOTE — Progress Notes (Addendum)
?  08/20/21 1105  ?Clinical Encounter Type  ?Visited With Family  ?Visit Type Critical Care;Initial;Spiritual support;Social support  ?Referral From Nurse  ?Consult/Referral To Chaplain ?( )  ?Spiritual Encounters  ?Spiritual Needs Prayer;Emotional;Grief support  ? ?At the request of patient's nurse, I met with parents of Vicki Moore at her bedside.  ?Father: Vicki Moore and Mother: Vicki Moore. Parents are extremely saddened and distraught in their grief. Ms. Takemoto explained that if patient's condition does not improve that they will transition Vicki Moore to comfort care. They are concerned about the ongoing upbringing of Vicki Moore. Vicki stated that she lives if she were to raise him that she would need to move him to [place] where she lives. A referral to Vicki Bryant, LCSW of Transitions of Care regarding custody and adoption was requested. She provided contact information to DSS. ?Chaplain  , M. Min., [phone]. ? ?Addendum: Contact Information for DSS [phone] related to guardianship received from Vicki Bryant, LCSW provided to Vicki and Vicki Virag. ?

## 2021-08-20 NOTE — Progress Notes (Signed)
? ?NAME:  Vicki Moore, MRN:  [id number], DOB:  [birth year], LOS: 4 ?ADMISSION DATE:  08/18/2021, CONSULTATION DATE:  09/09/2021 ?REFERRING MD:  Dr. Melina Copa, CHIEF COMPLAINT:  Cardiac arrest   ? ?History of Present Illness:  ?Vicki Moore is a 41 y.o. with a PMH WPW, Depression, anxiety, asthma and polysubstance abuse who presented to the ED via EMS after being found unresponsive by child. Per report patient was with 10yo son when she went to the bathroom and child heard a loud thud and patient was found unresponsive eon floor. On EMS arrival patient was found in PEA arrest, ACLS protocol ran and king airway placed. Unknown if patient received bystandard CPR. 8min time between call and EMS arrival.  ? ?On ED arrival patient was seen minimally responsive (non-purposeful movement to upper extremities) with mild hypothermia and mild tachycardia. Lab work significant for K 3.0, CO2 12, Alkaline phos 172, AST 390, ALT 228, lactic 7.1, and hgb 10.5. CXR with left lung base opacities concerning for aspiration. PCCM consulted for further management and admission.  ? ?Pertinent  Medical History  ?WPW, Depression, anxiety, asthma and polysubstance abuse  ? ?Significant Hospital Events: ?Including procedures, antibiotic start and stop dates in addition to other pertinent events   ?5/6 admitted after out of hospital cardiac arrest  ? ?Interim History / Subjective:  ?Still myoclonic on sedation wean. ? ?Objective   ?Blood pressure (!) 151/106, pulse 99, temperature 99.5 ?F (37.5 ?C), resp. rate (!) 23, height 5\' 3"  (1.6 m), weight 90.9 kg, SpO2 100 %. ?   ?Vent Mode: PRVC ?FiO2 (%):  [40 %] 40 % ?Set Rate:  [16 bmp] 16 bmp ?Vt Set:  [410 mL] 410 mL ?PEEP:  [5 cmH20] 5 cmH20 ?Plateau Pressure:  [13 cmH20-23 cmH20] 21 cmH20  ? ?Intake/Output Summary (Last 24 hours) at 08/20/2021 1124 ?Last data filed at 08/20/2021 1100 ?Gross per 24 hour  ?Intake 4342.64 ml  ?Output 2350 ml  ?Net 1992.64 ml  ? ? ?Filed Weights  ? 08/19/2021 1806  08/19/21 0403 08/20/21 0149  ?Weight: 90.7 kg 90.7 kg 90.9 kg  ? ? ?Examination: ?Ill appearing woman lying in bed ?Pupils reactive, corneals present ?+cough, triggering vent, +gag ?Improved rhonci ?Diffuse mycolonus without anything purposeful or response to command with sedation wean ? ?Mg repleted ?Cr okay ? ?Resolved Hospital Problem list   ? ? ?Assessment & Plan:  ?Prolonged OOH PEA arrest- echo benign.  Benzo overdose seems most likely cause. 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  History of abusing this. ?History of WPW- tele benign to date. Do not think this is proximal cause of arrest. ?Acute liver injury post arrest- labs today pending ?Hypo Mg- being repleted ? ?- Vent bundle, lung protective tidal volumes ?- Ceftriaxone x 5 days ?- TTM ?- Parents at bedside, went over MRI and her ongoing myoclonus.  All agreed that tomorrow if she was in similar state without sedation we should consider palliative withdraw of life support ? ?Best Practice (right click and "Reselect all SmartList Selections" daily)  ? ?Diet/type: TF ?DVT prophylaxis: prophylactic heparin  ?GI prophylaxis: PPI ?Lines: N/A ?Foley:  Yes, and it is still needed ?Code Status:  full code ?Last date of multidisciplinary goals of care discussion: 5/10, again tomorrow ? ?  34 min cc time ?Erskine Emery MD PCCM ? ? ?

## 2021-08-20 NOTE — Progress Notes (Addendum)
CSW received consult from [facility]. Patients mother has questions regarding how she can go about gaining custodial guardianship of her grandson. CSW provided number to DSS  at [phone] in regards to questions regarding guardianship or to contact a family law attorney for emergency custody information.  ?

## 2021-08-20 NOTE — Progress Notes (Signed)
?  08/20/21 1307  ?Clinical Encounter Type  ?Visited With Health care provider;Family  ?Visit Type Patient actively dying;Critical Care;Follow-up  ?Referral From Nurse  ?Consult/Referral To Chaplain ?Albertina Parr [place])  ?Spiritual Encounters  ?Spiritual Needs Grief support;Emotional  ? ?Spiritual Care consultation request to speak with patient's twelve-year-old son Walker Kehr about his sadness and grief associated with discontinuation of life support and eventual death of his mother. I met with Ronan at his mother's bedside. Grandparents remained in patient's room also. ? ?Walker Kehr stated that his grandparents have already told him that ?life support machines would be discontinued and that his mother probably would not be able to breath on her own after being taken off the machines.? Walker Kehr states that he is both ?mad? and ?sad? but continues to be hopeful of complete recovery. Walker Kehr realizes that he will then need to live with his grandparents after his mother's death. Ronan thanked me for speaking with him, but wanted to go be with his grandparents. ? ?Orest Dikes, Ivin Poot., [phone]. ?

## 2021-08-21 DIAGNOSIS — I469 Cardiac arrest, cause unspecified: Secondary | ICD-10-CM | POA: Diagnosis not present

## 2021-08-21 LAB — SODIUM
Sodium: 140 mmol/L (ref 135–145)
Sodium: 133 mmol/L — ABNORMAL LOW (ref 135–145)

## 2021-08-21 LAB — HEPATIC FUNCTION PANEL
ALT: 39 U/L (ref 0–44)
AST: 30 U/L (ref 15–41)
Albumin: 2.1 g/dL — ABNORMAL LOW (ref 3.5–5.0)
Alkaline Phosphatase: 139 U/L — ABNORMAL HIGH (ref 38–126)
Bilirubin, Direct: <0.1 mg/dL (ref 0.0–0.2)
Total Bilirubin: 0.3 mg/dL (ref 0.3–1.2)
Total Protein: 5.6 g/dL — ABNORMAL LOW (ref 6.5–8.1)

## 2021-08-21 LAB — PHOSPHORUS: Phosphorus: 3.6 mg/dL (ref 2.5–4.6)

## 2021-08-21 LAB — BASIC METABOLIC PANEL
Anion gap: 6 (ref 5–15)
BUN: 7 mg/dL (ref 6–20)
CO2: 26 mmol/L (ref 22–32)
Calcium: 8.2 mg/dL — ABNORMAL LOW (ref 8.9–10.3)
Chloride: 103 mmol/L (ref 98–111)
Creatinine, Ser: <0.3 mg/dL — ABNORMAL LOW (ref 0.44–1.00)
Glucose, Bld: 115 mg/dL — ABNORMAL HIGH (ref 70–99)
Potassium: 3.8 mmol/L (ref 3.5–5.1)
Sodium: 135 mmol/L (ref 135–145)

## 2021-08-21 LAB — CBC
HCT: 28.9 % — ABNORMAL LOW (ref 36.0–46.0)
Hemoglobin: 8.9 g/dL — ABNORMAL LOW (ref 12.0–15.0)
MCH: 23.5 pg — ABNORMAL LOW (ref 26.0–34.0)
MCHC: 30.8 g/dL (ref 30.0–36.0)
MCV: 76.3 fL — ABNORMAL LOW (ref 80.0–100.0)
Platelets: 275 10*3/uL (ref 150–400)
RBC: 3.79 MIL/uL — ABNORMAL LOW (ref 3.87–5.11)
RDW: 19.3 % — ABNORMAL HIGH (ref 11.5–15.5)
WBC: 6.7 10*3/uL (ref 4.0–10.5)
nRBC: 0 % (ref 0.0–0.2)

## 2021-08-21 LAB — GLUCOSE, CAPILLARY
Glucose-Capillary: 101 mg/dL — ABNORMAL HIGH (ref 70–99)
Glucose-Capillary: 102 mg/dL — ABNORMAL HIGH (ref 70–99)
Glucose-Capillary: 89 mg/dL (ref 70–99)
Glucose-Capillary: 91 mg/dL (ref 70–99)
Glucose-Capillary: 95 mg/dL (ref 70–99)

## 2021-08-21 LAB — MAGNESIUM: Magnesium: 1.8 mg/dL (ref 1.7–2.4)

## 2021-08-21 MED ORDER — MAGNESIUM SULFATE 2 GM/50ML IV SOLN
2.0000 g | Freq: Once | INTRAVENOUS | Status: AC
  Administered 2021-08-21: 2 g via INTRAVENOUS
  Filled 2021-08-21: qty 50

## 2021-08-21 MED ORDER — POTASSIUM CHLORIDE 20 MEQ PO PACK
40.0000 meq | PACK | Freq: Once | ORAL | Status: AC
Start: 2021-08-21 — End: 2021-08-21
  Administered 2021-08-21: 40 meq
  Filled 2021-08-21: qty 2

## 2021-08-21 NOTE — Progress Notes (Signed)
?   08/21/21 1600  ?Clinical Encounter Type  ?Visited With Health care provider  ?Visit Type Follow-up  ?Referral From Chaplain ?(Rounding)  ?Consult/Referral To  ?Gelene Mink [place])  ? ?Attempted follow-up visit, but no family present. ?[address] [place], Aletha Halim., [phone]  ? ?

## 2021-08-21 NOTE — Progress Notes (Signed)
? ?NAME:  Vicki Moore, MRN:  [id number], DOB:  Dec 24, 1980, LOS: 5 ?ADMISSION DATE:  08/15/2021, CONSULTATION DATE:  08/27/2021 ?REFERRING MD:  Dr. Melina Copa, CHIEF COMPLAINT:  Cardiac arrest   ? ?History of Present Illness:  ?Vicki Moore is a 41 y.o. with a PMH WPW, Depression, anxiety, asthma and polysubstance abuse who presented to the ED via EMS after being found unresponsive by child. Per report patient was with 10yo son when she went to the bathroom and child heard a loud thud and patient was found unresponsive eon floor. On EMS arrival patient was found in PEA arrest, ACLS protocol ran and king airway placed. Unknown if patient received bystandard CPR. 46min time between call and EMS arrival.  ? ?On ED arrival patient was seen minimally responsive (non-purposeful movement to upper extremities) with mild hypothermia and mild tachycardia. Lab work significant for K 3.0, CO2 12, Alkaline phos 172, AST 390, ALT 228, lactic 7.1, and hgb 10.5. CXR with left lung base opacities concerning for aspiration. PCCM consulted for further management and admission.  ? ?Pertinent  Medical History  ?WPW, Depression, anxiety, asthma and polysubstance abuse  ? ?Significant Hospital Events: ?Including procedures, antibiotic start and stop dates in addition to other pertinent events   ?5/6 admitted after out of hospital cardiac arrest  ? ?Interim History / Subjective:  ?No events, heavily sedated today. ?Family coming in later. ? ?Objective   ?Blood pressure (!) 141/81, pulse (!) 114, temperature 99.5 ?F (37.5 ?C), temperature source Bladder, resp. rate (!) 32, height 5\' 3"  (1.6 m), weight 90.9 kg, SpO2 100 %. ?   ?Vent Mode: PRVC ?FiO2 (%):  [40 %] 40 % ?Set Rate:  [16 bmp] 16 bmp ?Vt Set:  [410 mL] 410 mL ?PEEP:  [5 cmH20] 5 cmH20 ?Pressure Support:  [10 cmH20] 10 cmH20 ?Plateau Pressure:  [15 cmH20-18 cmH20] 15 cmH20  ? ?Intake/Output Summary (Last 24 hours) at 08/21/2021 0921 ?Last data filed at 08/21/2021 0900 ?Gross per 24  hour  ?Intake 5026.96 ml  ?Output 5125 ml  ?Net -98.04 ml  ? ? ?Filed Weights  ? 08/22/2021 1806 08/19/21 0403 08/20/21 0149  ?Weight: 90.7 kg 90.7 kg 90.9 kg  ? ? ?Examination: ?Ill appearing woman ?Lungs clear ?Ext warm ?Brainstem reflexes intact ?No response to painful stimuli ?Urine output now increased and wattery question DI ? ?K repleted ?Checking another sodium ?CBC okay ? ?Resolved Hospital Problem list   ? ? ?Assessment & Plan:  ?Prolonged OOH PEA arrest- echo benign.  Benzo overdose seems most likely cause. 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  History of abusing this. ?History of WPW- tele benign to date. Do not think this is proximal cause of arrest. ?Acute liver injury post arrest- labs today pending ?Hypo K- being repleted ? ?- Vent bundle, lung protective tidal volumes ?- Ceftriaxone x 5 days ?- TTM ?- Check Na, tx if s/s of DI with usual FW and DDAVP ?- Family coming in later today and likely transition to comfort measures ? ?Best Practice (right click and "Reselect all SmartList Selections" daily)  ? ?Diet/type: TF ?DVT prophylaxis: prophylactic heparin  ?GI prophylaxis: PPI ?Lines: N/A ?Foley:  Yes, and it is still needed ?Code Status:  full code ?Last date of multidisciplinary goals  of care discussion: 5/10, again today ? ?32 min cc time ?Erskine Emery MD PCCM ? ? ?

## 2021-08-22 ENCOUNTER — Encounter: Payer: Self-pay | Admitting: Pulmonary Disease

## 2021-08-22 DIAGNOSIS — I469 Cardiac arrest, cause unspecified: Secondary | ICD-10-CM | POA: Diagnosis not present

## 2021-08-22 LAB — GLUCOSE, CAPILLARY
Glucose-Capillary: 104 mg/dL — ABNORMAL HIGH (ref 70–99)
Glucose-Capillary: 106 mg/dL — ABNORMAL HIGH (ref 70–99)
Glucose-Capillary: 109 mg/dL — ABNORMAL HIGH (ref 70–99)
Glucose-Capillary: 112 mg/dL — ABNORMAL HIGH (ref 70–99)
Glucose-Capillary: 113 mg/dL — ABNORMAL HIGH (ref 70–99)
Glucose-Capillary: 118 mg/dL — ABNORMAL HIGH (ref 70–99)
Glucose-Capillary: 130 mg/dL — ABNORMAL HIGH (ref 70–99)
Glucose-Capillary: 382 mg/dL — ABNORMAL HIGH (ref 70–99)

## 2021-08-22 LAB — BASIC METABOLIC PANEL
Anion gap: 6 (ref 5–15)
BUN: 10 mg/dL (ref 6–20)
CO2: 21 mmol/L — ABNORMAL LOW (ref 22–32)
Calcium: 7.1 mg/dL — ABNORMAL LOW (ref 8.9–10.3)
Chloride: 102 mmol/L (ref 98–111)
Creatinine, Ser: 0.33 mg/dL — ABNORMAL LOW (ref 0.44–1.00)
GFR, Estimated: >60 mL/min (ref >60)
Glucose, Bld: 91 mg/dL (ref 70–99)
Potassium: 3.5 mmol/L (ref 3.5–5.1)
Sodium: 129 mmol/L — ABNORMAL LOW (ref 135–145)

## 2021-08-22 LAB — CBC
HCT: 27.5 % — ABNORMAL LOW (ref 36.0–46.0)
Hemoglobin: 8.5 g/dL — ABNORMAL LOW (ref 12.0–15.0)
MCH: 23.8 pg — ABNORMAL LOW (ref 26.0–34.0)
MCHC: 30.9 g/dL (ref 30.0–36.0)
MCV: 77 fL — ABNORMAL LOW (ref 80.0–100.0)
Platelets: 333 10*3/uL (ref 150–400)
RBC: 3.57 MIL/uL — ABNORMAL LOW (ref 3.87–5.11)
RDW: 19.5 % — ABNORMAL HIGH (ref 11.5–15.5)
WBC: 5.9 10*3/uL (ref 4.0–10.5)
nRBC: 0 % (ref 0.0–0.2)

## 2021-08-22 LAB — HEPATIC FUNCTION PANEL
ALT: 25 U/L (ref 0–44)
AST: 24 U/L (ref 15–41)
Albumin: 1.9 g/dL — ABNORMAL LOW (ref 3.5–5.0)
Alkaline Phosphatase: 86 U/L (ref 38–126)
Bilirubin, Direct: 0.2 mg/dL (ref 0.0–0.2)
Indirect Bilirubin: 0.9 mg/dL (ref 0.3–0.9)
Total Bilirubin: 1.1 mg/dL (ref 0.3–1.2)
Total Protein: 4.7 g/dL — ABNORMAL LOW (ref 6.5–8.1)

## 2021-08-22 LAB — SODIUM
Sodium: 133 mmol/L — ABNORMAL LOW (ref 135–145)
Sodium: 135 mmol/L (ref 135–145)

## 2021-08-22 LAB — MAGNESIUM: Magnesium: 1.7 mg/dL (ref 1.7–2.4)

## 2021-08-22 MED ORDER — IPRATROPIUM-ALBUTEROL 0.5-2.5 (3) MG/3ML IN SOLN
3.0000 mL | Freq: Four times a day (QID) | RESPIRATORY_TRACT | Status: DC
  Administered 2021-08-22 – 2021-08-26 (×19): 3 mL via RESPIRATORY_TRACT
  Filled 2021-08-22 (×19): qty 3

## 2021-08-22 MED ORDER — MAGNESIUM SULFATE 2 GM/50ML IV SOLN
2.0000 g | Freq: Once | INTRAVENOUS | Status: AC
  Administered 2021-08-22: 2 g via INTRAVENOUS
  Filled 2021-08-22: qty 50

## 2021-08-22 MED ORDER — POTASSIUM CHLORIDE 20 MEQ PO PACK
40.0000 meq | PACK | Freq: Once | ORAL | Status: AC
  Administered 2021-08-22: 40 meq
  Filled 2021-08-22: qty 2

## 2021-08-22 MED ORDER — NOREPINEPHRINE 16 MG/250ML-% IV SOLN
0.0000 ug/min | INTRAVENOUS | Status: DC
  Administered 2021-08-22 – 2021-08-31 (×2): 2 ug/min via INTRAVENOUS
  Filled 2021-08-22 (×3): qty 250

## 2021-08-22 NOTE — Progress Notes (Signed)
?   08/22/21 0940  ?Clinical Encounter Type  ?Visited With Patient  ?Visit Type Initial;Spiritual support  ?Referral From Chaplain  ?Consult/Referral To Chaplain  ? ?Chaplain responded to a request to visit was patient and family. Patient was quiet and I talked to the patient, Vicki Moore, for a few minutes and said a prayer for her.  Family was not present at the time of my visit. Nurses were making adjustments to her medications.  ? ?Danice Goltz ?Chaplain  ?[facility]  ?[phone] ?

## 2021-08-22 NOTE — Progress Notes (Addendum)
? ?NAME:  Vicki Moore, MRN:  [id number], DOB:  Jul 03, 1980, LOS: 6 ?ADMISSION DATE:  08/28/2021, CONSULTATION DATE:  08/15/2021 ?REFERRING MD:  Dr. Charm Barges, CHIEF COMPLAINT:  Cardiac arrest   ? ?History of Present Illness:  ?Vicki Moore is a 41 y.o. with a PMH WPW, Depression, anxiety, asthma and polysubstance abuse who presented to the ED via EMS after being found unresponsive by child. Per report patient was with 10yo son when she went to the bathroom and child heard a loud thud and patient was found unresponsive eon floor. On EMS arrival patient was found in PEA arrest, ACLS protocol ran and king airway placed. Unknown if patient received bystandard CPR. time between call and EMS arrival.  ? ?On ED arrival patient was seen minimally responsive (non-purposeful movement to upper extremities) with mild hypothermia and mild tachycardia. Lab work significant for K 3.0, CO2 12, Alkaline phos 172, AST 390, ALT 228, lactic 7.1, and hgb 10.5. CXR with left lung base opacities concerning for aspiration. PCCM consulted for further management and admission.  ? ?Pertinent  Medical History  ?WPW, Depression, anxiety, asthma and polysubstance abuse  ? ?Significant Hospital Events: ?Including procedures, antibiotic start and stop dates in addition to other pertinent events   ?5/6 admitted after out of hospital cardiac arrest  ?5/9 MRI c/w diffuse anoxic injury ?5/11 Heavy sedation ? ?Interim History / Subjective:  ?Afebrile  ?Remains on sedation / myoclonus  ?I/O 3.7L UOP, +1.3L in last 24 hours ?No events overnight ? ?Objective   ?Blood pressure 114/72, pulse 98, temperature 98.6 ?F (37 ?C), resp. rate 18, height 5\' 3"  (1.6 m), weight 89.6 kg, SpO2 100 %. ?   ?Vent Mode: PRVC ?FiO2 (%):  [40 %] 40 % ?Set Rate:  [16 bmp] 16 bmp ?Vt Set:  [410 mL] 410 mL ?PEEP:  [5 cmH20] 5 cmH20 ?Plateau Pressure:  [15 cmH20] 15 cmH20  ? ?Intake/Output Summary (Last 24 hours) at 08/22/2021 0759 ?Last data filed at 08/22/2021  0700 ?Gross per 24 hour  ?Intake 5180.91 ml  ?Output 3785 ml  ?Net 1395.91 ml  ? ?Filed Weights  ? 08/19/21 0403 08/20/21 0149 08/22/21 0500  ?Weight: 90.7 kg 90.9 kg 89.6 kg  ? ? ?Examination: ?General: adult female, critically ill appearing on vent ?HEENT: MM pink/moist, anicteric, pupils 60mm = and reactive ?Neuro: sedate, pupils reactive, +cough ?CV: s1s2 RRR, no m/r/g ?PULM: non-labored, lungs bilaterally with wheezing ?GI: soft, bsx4 active ?GU: clear yellow urine  ?Extremities: warm/dry, no edema  ?Skin: no rashes or lesions ? ? ?Resolved Hospital Problem list   ? ? ?Assessment & Plan:  ? ?Prolonged OOH PEA arrest- echo benign.  Benzo overdose seems most likely cause. 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  History of abusing this. ?History of WPW- tele benign to date. Do not think this is cause of arrest. ?Acute liver injury post arrest ?Hypo K ?Moderate Protein Calorie Malnutrition ?Anemia  ? ?-Normothermia protocol, brain protective measures ?-PRVC 8cc/kg aas rest mode ?-VAP prevention measures ?-completed ceftriaxone ?-stop LR infusion ?-follow up Na+ this afternoon ?-complex social dynamics with patients parents, patients son's father, surrounding custody issues ?-duoneb for bronchospasm ?-KCL 40 mEq x1 ?-pending further discussion with family regarding plan of care ? ?Best Practice (right click and "  Reselect all SmartList Selections" daily)  ?Diet/type: TF ?DVT prophylaxis: prophylactic heparin  ?GI prophylaxis: PPI ?Lines: N/A ?Foley:  Yes, and it is still needed ?Code Status:  full code ?Last date of multidisciplinary goals of care discussion: 5/10.  Await arrival 5/12 for further discussion. ? ?Critical Care Time: 35 minutes  ? ?Canary Brim,  MSN, APRN, NP-C, AGACNP-BC ?[facility] ?08/22/2021, 1:06 PM ? ? ?Please see [email or web address] for pager details.  ? ?From 7A-7P if no response, please call [phone] ?After hours, please call Pola Corn [phone] ? ? ? ?

## 2021-08-22 NOTE — Progress Notes (Signed)
[facility] ADULT ICU REPLACEMENT PROTOCOL ? ? ?The patient does apply for the [facility] Adult ICU Electrolyte Replacment Protocol based on the criteria listed below:  ? ?1.Exclusion criteria: TCTS patients, ECMO patients, and Dialysis patients ?2. Is GFR >/= 30 ml/min? Yes.    ?Patient's GFR today is >60 ?3. Is SCr </= 2? Yes.   ?Patient's SCr is 0.33 mg/dL ?4. Did SCr increase >/= 0.5 in 24 hours? No. ?5.Pt's weight >40kg  Yes.   ?6. Abnormal electrolyte(s): K+ 3.5, mag 1.7  ?7. Electrolytes replaced per protocol ?8.  Call MD STAT for K+ </= 2.5, Phos </= 1, or Mag </= 1 ?Physician:  n/a ? ? ?Vicki Moore Vicki Moore 08/22/2021 4:05 AM ? ?

## 2021-08-23 DIAGNOSIS — I469 Cardiac arrest, cause unspecified: Secondary | ICD-10-CM | POA: Diagnosis not present

## 2021-08-23 LAB — CBC
HCT: 28.8 % — ABNORMAL LOW (ref 36.0–46.0)
Hemoglobin: 8.8 g/dL — ABNORMAL LOW (ref 12.0–15.0)
MCH: 23.4 pg — ABNORMAL LOW (ref 26.0–34.0)
MCHC: 30.6 g/dL (ref 30.0–36.0)
MCV: 76.6 fL — ABNORMAL LOW (ref 80.0–100.0)
Platelets: 405 10*3/uL — ABNORMAL HIGH (ref 150–400)
RBC: 3.76 MIL/uL — ABNORMAL LOW (ref 3.87–5.11)
RDW: 19.5 % — ABNORMAL HIGH (ref 11.5–15.5)
WBC: 9.1 10*3/uL (ref 4.0–10.5)
nRBC: 0 % (ref 0.0–0.2)

## 2021-08-23 LAB — BASIC METABOLIC PANEL
Anion gap: 6 (ref 5–15)
BUN: 11 mg/dL (ref 6–20)
CO2: 27 mmol/L (ref 22–32)
Calcium: 8.6 mg/dL — ABNORMAL LOW (ref 8.9–10.3)
Chloride: 101 mmol/L (ref 98–111)
Creatinine, Ser: 0.42 mg/dL — ABNORMAL LOW (ref 0.44–1.00)
GFR, Estimated: >60 mL/min (ref >60)
Glucose, Bld: 120 mg/dL — ABNORMAL HIGH (ref 70–99)
Potassium: 4.3 mmol/L (ref 3.5–5.1)
Sodium: 134 mmol/L — ABNORMAL LOW (ref 135–145)

## 2021-08-23 LAB — GLUCOSE, CAPILLARY
Glucose-Capillary: 127 mg/dL — ABNORMAL HIGH (ref 70–99)
Glucose-Capillary: 132 mg/dL — ABNORMAL HIGH (ref 70–99)
Glucose-Capillary: 114 mg/dL — ABNORMAL HIGH (ref 70–99)
Glucose-Capillary: 96 mg/dL (ref 70–99)
Glucose-Capillary: 100 mg/dL — ABNORMAL HIGH (ref 70–99)
Glucose-Capillary: 122 mg/dL — ABNORMAL HIGH (ref 70–99)

## 2021-08-23 LAB — TRIGLYCERIDES: Triglycerides: 375 mg/dL — ABNORMAL HIGH (ref <150)

## 2021-08-23 LAB — MAGNESIUM: Magnesium: 1.9 mg/dL (ref 1.7–2.4)

## 2021-08-23 MED ORDER — MAGNESIUM SULFATE 2 GM/50ML IV SOLN
2.0000 g | Freq: Once | INTRAVENOUS | Status: AC
  Administered 2021-08-23: 2 g via INTRAVENOUS
  Filled 2021-08-23: qty 50

## 2021-08-23 MED ORDER — MIDAZOLAM BOLUS VIA INFUSION
2.0000 mg | INTRAVENOUS | Status: DC | PRN
  Administered 2021-08-23: 4 mg via INTRAVENOUS
  Administered 2021-08-23 – 2021-08-24 (×4): 2 mg via INTRAVENOUS
  Administered 2021-08-24: 4 mg via INTRAVENOUS
  Administered 2021-08-24: 2 mg via INTRAVENOUS
  Administered 2021-08-31 (×11): 4 mg via INTRAVENOUS
  Filled 2021-08-23: qty 4

## 2021-08-23 NOTE — TOC Initial Note (Signed)
Transition of Care (TOC) - Initial/Assessment Note  ? ? ?Patient Details  ?Name: Vicki Moore ?MRN: [id number] ?Date of Birth: Mar 10, 1981 ? ?Transition of Care (TOC) CM/SW Contact:    ?Maycie Luera, [place], [place] ?Phone Number: ?08/23/2021, 4:55 PM ? ?Clinical Narrative:                 ?Phone call Child to Child Protective Services-patient's medical status discussed. Grandparents are next of kin as their is no father listed on the birth certificate. Spoke with [name] who screened patient out as patient's 40 year old son has identified caregivers and is in no immediate danger at this time. Patient's father contacted by phone, patient's mother and step-mother present. CPS being contacted discussed as well as the importance of contacting a Family Law Attorney to establish legal responsibility for patient's 60 year old son.  All are in agreement of this and will plan to contact an attorney on Monday morning. Contacting an attorney reinforced especially due to the fact that the planned burial will be in [place]. ? ?Transition of Care to continue to follow ? ?[name], LCSW ?Transition of Care ? ? ? ?  ?  ? ? ?Patient Goals and CMS Choice ?  ?  ?  ? ?Expected Discharge Plan and Services ?  ?  ?  ?  ?  ?                ?  ?  ?  ?  ?  ?  ?  ?  ?  ?  ? ?Prior Living Arrangements/Services ?  ?  ?  ?       ?  ?  ?  ?  ? ?Activities of Daily Living ?  ?  ? ?Permission Sought/Granted ?  ?  ?   ?   ?   ?   ? ?Emotional Assessment ?  ?  ?  ?  ?  ?  ? ?Admission diagnosis:  Cardiac arrest [facility]) [I46.9] ?Respiratory arrest (HCC) [R09.2] ?Patient Active Problem List  ? Diagnosis Date Noted  ? Cardiac arrest (HCC) 08/15/2021  ? WPW (Wolff-Parkinson-White syndrome) 11/24/2020  ? Hiatal hernia   ? Non-intractable vomiting   ? Acute asthma exacerbation 11/20/2020  ? Acute hypoxemic respiratory failure (HCC) 11/20/2020  ? Abdominal pain 11/20/2020  ? SVT (supraventricular tachycardia) (HCC) 11/20/2020  ? Lactic acidosis  11/20/2020  ? Acute respiratory failure with hypoxia (HCC) 11/10/2020  ? Elevated LFTs 11/10/2020  ? Opioid abuse (HCC) 11/10/2020  ? Chronic prescription opiate use 08/04/2020  ? Benzodiazepine withdrawal (HCC) 08/03/2020  ? Chronic pain 08/03/2020  ? Hypokalemia 08/03/2020  ? Alcohol abuse 08/03/2020  ? Tobacco abuse 08/03/2020  ? Anxiety disorder   ? Nephrolithiasis 01/10/2020  ? Biliary colic   ? Right upper quadrant pain 11/08/2019  ? RUQ pain 11/07/2019  ? GERD (gastroesophageal reflux disease) 11/07/2019  ? Bilateral foot pain 12/08/2017  ? 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 Hypoxemia 08/08/2017  ? Unspecified asthma with (acute) exacerbation 08/08/2017  ? Influenza A with pneumonia 04/29/2017  ? 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 Drug overdose 04/23/2017  ? Polysubstance abuse (HCC) 04/23/2017  ? Anxiety state 03/22/2009  ? Asthma 01/02/2009  ? LOW BACK PAIN 01/02/2009  ? ?PCP:  [name], MD ?Pharmacy:   ?[address] [place], [place] - [address] ?[address] ?[place] [address] ?Phone: [phone] Fax: [phone] ? ?[facility] [phone] -  [place], [place] - [address] AT [facility] [address] ?[address] ?[place] [address] ?Phone: [phone] Fax: [phone] ? ? ? ? ?Social Determinants of Health (SDOH) Interventions ?  ? ?Readmission Risk Interventions ?   ? View : No data to display.  ?  ?  ?  ? ? ? ?

## 2021-08-23 NOTE — Progress Notes (Signed)
? ?NAME:  Vicki Moore, MRN:  [id number], DOB:  Feb 18, 1981, LOS: 7 ?ADMISSION DATE:  09/05/2021, CONSULTATION DATE:  08/12/2021 ?REFERRING MD:  Dr. Melina Copa, CHIEF COMPLAINT:  Cardiac arrest   ? ?History of Present Illness:  ?Vicki Moore is a 41 y.o. with a PMH WPW, Depression, anxiety, asthma and polysubstance abuse who presented to the ED via EMS after being found unresponsive by child. Per report patient was with 10yo son when she went to the bathroom and child heard a loud thud and patient was found unresponsive eon floor. On EMS arrival patient was found in PEA arrest, ACLS protocol ran and king airway placed. Unknown if patient received bystandard CPR. 35min time between call and EMS arrival.  ? ?On ED arrival patient was seen minimally responsive (non-purposeful movement to upper extremities) with mild hypothermia and mild tachycardia. Lab work significant for K 3.0, CO2 12, Alkaline phos 172, AST 390, ALT 228, lactic 7.1, and hgb 10.5. CXR with left lung base opacities concerning for aspiration. PCCM consulted for further management and admission.  ? ?Pertinent  Medical History  ?WPW, Depression, anxiety, asthma and polysubstance abuse  ? ?Significant Hospital Events: ?Including procedures, antibiotic start and stop dates in addition to other pertinent events   ?5/6 admitted after out of hospital cardiac arrest  ?5/9 MRI c/w diffuse anoxic injury ?5/11 Heavy sedation ? ?Interim History / Subjective:  ?Myoclonic activity with minimal stimulation on lower versed dose. GoC discussion with family as detailed in separate progress note today. ? ?Objective   ?Blood pressure 126/85, pulse 100, temperature 99 ?F (37.2 ?C), temperature source Bladder, resp. rate 20, height 5\' 3"  (1.6 m), weight 85.6 kg, SpO2 100 %. ?   ?Vent Mode: PRVC ?FiO2 (%):  [40 %] 40 % ?Set Rate:  [16 bmp] 16 bmp ?Vt Set:  [410 mL] 410 mL ?PEEP:  [5 cmH20] 5 cmH20 ?Plateau Pressure:  [15 cmH20-19 cmH20] 16 cmH20  ? ?Intake/Output Summary  (Last 24 hours) at 08/23/2021 1220 ?Last data filed at 08/23/2021 1200 ?Gross per 24 hour  ?Intake 3258.22 ml  ?Output 6675 ml  ?Net -3416.78 ml  ? ?Filed Weights  ? 08/20/21 0149 08/22/21 0500 08/23/21 0651  ?Weight: 90.9 kg 89.6 kg 85.6 kg  ? ? ?Examination: ?General: adult female, critically ill appearing on vent ?HEENT: PERRL ?Neuro: sedated pupils reactive, +weak gag, myoclonus with minimal stimulation ?CV: tachy, RR, no m/r/g ?PULM: non-labored, equal chest rise ?GI: soft, bsx4 active ?GU: clear yellow urine  ?Extremities: warm/dry, no edema  ?Skin: no rashes or lesions ? ?Labs/imaging reviewed  ? ? ?Resolved Hospital Problem list   ? ? ?Assessment & Plan:  ? ?Prolonged OOH PEA arrest- echo benign.  Benzo overdose seems most likely cause. 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  History of abusing this. ?History of WPW- tele benign to date. Do not think this is cause of arrest. ?Acute liver injury post arrest ?Hypo K ?Moderate Protein Calorie Malnutrition ?Anemia  ? ?-fever avoidance, brain protective measures ?-PRVC 8cc/kg as rest mode ?-VAP prevention measures ?-completed ceftriaxone ?-complex social dynamics with patients parents, patients son's father, surrounding custody issues - see separate progress note today ? ? ?Best Practice (right click and "Reselect all SmartList Selections" daily)  ?Diet/type: TF ?DVT  prophylaxis: prophylactic heparin  ?GI prophylaxis: PPI ?Lines: N/A ?Foley:  Yes, and it is still needed ?Code Status:  DNR ?Last date of multidisciplinary goals of care discussion: 5/13 - see IPAL note today ? ?Critical Care Time: 35 minutes  ? ?[name]  ?[name] ? ?08/23/2021, 12:20  PM ? ? ?Please see [email or web address] for pager details.  ? ?From 7A-7P if no response, please call [phone] ?After hours, please call Warren Lacy [phone] ? ? ? ?

## 2021-08-23 NOTE — Progress Notes (Signed)
?  Interdisciplinary Goals of Care Family Meeting ? ? ?Date carried out:: 08/23/2021 ? ?Location of the meeting: Bedside ? ?Member's involved: Physician, Bedside Registered Nurse, and Family Member or next of kin ? ?Durable Power of Insurance risk surveyor: Pt's father and mother   ? ?Discussion: We discussed goals of care for [facility] .  Pts father, mother, and stepmother at bedside. Their hope is to establish guardianship so that they can then get passport for pt's son so that he can fly with her body to [place] where she wishes to be buried and have funeral. They are also hopeful she can complete organ donation evaluation. In the long run they are also hopeful that the patient's son can share guardianship wit pt's father/stepmother (they live in [place]) and pt's mother (lives in [place]). This is all best case scenario. They desperately wish to help him avoid foster system at a minimum though. We talked about how she could easily sustain another cardiac arrest however before any of this can be established. I made recommendation, and they agreed, that CPR/shocks would not meaningfully change her outcome and would only prolong suffering. She was made DNR. I spoke with SW on call who will attempt to discuss with DSS whether at least temporary custody could be granted more quickly for pt's child to pt's father and stepmother.  ? ?Code status: Full DNR ? ?Disposition: Continue current acute care ? ? ?Time spent for the meeting: 20 minutes ? ?Omar Person ?08/23/2021, 3:42 PM ? ?

## 2021-08-24 DIAGNOSIS — I469 Cardiac arrest, cause unspecified: Secondary | ICD-10-CM | POA: Diagnosis not present

## 2021-08-24 LAB — CBC
HCT: 29.5 % — ABNORMAL LOW (ref 36.0–46.0)
Hemoglobin: 9.2 g/dL — ABNORMAL LOW (ref 12.0–15.0)
MCH: 23.9 pg — ABNORMAL LOW (ref 26.0–34.0)
MCHC: 31.2 g/dL (ref 30.0–36.0)
MCV: 76.6 fL — ABNORMAL LOW (ref 80.0–100.0)
Platelets: 399 10*3/uL (ref 150–400)
RBC: 3.85 MIL/uL — ABNORMAL LOW (ref 3.87–5.11)
RDW: 19.4 % — ABNORMAL HIGH (ref 11.5–15.5)
WBC: 8.7 10*3/uL (ref 4.0–10.5)
nRBC: 0 % (ref 0.0–0.2)

## 2021-08-24 LAB — BASIC METABOLIC PANEL
Anion gap: 6 (ref 5–15)
BUN: 13 mg/dL (ref 6–20)
CO2: 29 mmol/L (ref 22–32)
Calcium: 8.7 mg/dL — ABNORMAL LOW (ref 8.9–10.3)
Chloride: 100 mmol/L (ref 98–111)
Creatinine, Ser: 0.35 mg/dL — ABNORMAL LOW (ref 0.44–1.00)
GFR, Estimated: >60 mL/min (ref >60)
Glucose, Bld: 110 mg/dL — ABNORMAL HIGH (ref 70–99)
Potassium: 4.1 mmol/L (ref 3.5–5.1)
Sodium: 135 mmol/L (ref 135–145)

## 2021-08-24 LAB — GLUCOSE, CAPILLARY
Glucose-Capillary: 108 mg/dL — ABNORMAL HIGH (ref 70–99)
Glucose-Capillary: 115 mg/dL — ABNORMAL HIGH (ref 70–99)
Glucose-Capillary: 116 mg/dL — ABNORMAL HIGH (ref 70–99)
Glucose-Capillary: 119 mg/dL — ABNORMAL HIGH (ref 70–99)
Glucose-Capillary: 95 mg/dL (ref 70–99)
Glucose-Capillary: 96 mg/dL (ref 70–99)

## 2021-08-24 LAB — MAGNESIUM: Magnesium: 1.9 mg/dL (ref 1.7–2.4)

## 2021-08-24 MED ORDER — MAGNESIUM SULFATE 2 GM/50ML IV SOLN
2.0000 g | Freq: Once | INTRAVENOUS | Status: AC
  Administered 2021-08-24: 2 g via INTRAVENOUS
  Filled 2021-08-24: qty 50

## 2021-08-24 NOTE — Progress Notes (Signed)
? ?NAME:  Vicki Moore, MRN:  [id number], DOB:  May 18, 1980, LOS: 8 ?ADMISSION DATE:  08/22/2021, CONSULTATION DATE:  08/17/2021 ?REFERRING MD:  Dr. Melina Copa, CHIEF COMPLAINT:  Cardiac arrest   ? ?History of Present Illness:  ?Vicki Moore is a 41 y.o. with a PMH WPW, Depression, anxiety, asthma and polysubstance abuse who presented to the ED via EMS after being found unresponsive by child. Per report patient was with 10yo son when she went to the bathroom and child heard a loud thud and patient was found unresponsive eon floor. On EMS arrival patient was found in PEA arrest, ACLS protocol ran and king airway placed. Unknown if patient received bystandard CPR. 75min time between call and EMS arrival.  ? ?On ED arrival patient was seen minimally responsive (non-purposeful movement to upper extremities) with mild hypothermia and mild tachycardia. Lab work significant for K 3.0, CO2 12, Alkaline phos 172, AST 390, ALT 228, lactic 7.1, and hgb 10.5. CXR with left lung base opacities concerning for aspiration. PCCM consulted for further management and admission.  ? ?Pertinent  Medical History  ?WPW, Depression, anxiety, asthma and polysubstance abuse  ? ?Significant Hospital Events: ?Including procedures, antibiotic start and stop dates in addition to other pertinent events   ?5/6 admitted after out of hospital cardiac arrest  ?5/9 MRI c/w diffuse anoxic injury ?5/11 Heavy sedation ? ?Interim History / Subjective:  ?More myoclonic activity last night, this morning. Lots of ETT secretions but no change in vent settings. Otherwise no acute issues. ? ?Objective   ?Blood pressure 128/89, pulse (!) 101, temperature 98.8 ?F (37.1 ?C), temperature source Oral, resp. rate 20, height 5\' 3"  (1.6 m), weight 85.6 kg, SpO2 100 %. ?   ?Vent Mode: PRVC ?FiO2 (%):  [40 %] 40 % ?Set Rate:  [16 bmp] 16 bmp ?Vt Set:  [410 mL] 410 mL ?PEEP:  [5 cmH20] 5 cmH20 ?Plateau Pressure:  [16 cmH20-20 cmH20] 17 cmH20  ? ?Intake/Output Summary  (Last 24 hours) at 08/24/2021 1118 ?Last data filed at 08/24/2021 0900 ?Gross per 24 hour  ?Intake 2433.45 ml  ?Output 4450 ml  ?Net -2016.55 ml  ? ?Filed Weights  ? 08/20/21 0149 08/22/21 0500 08/23/21 0651  ?Weight: 90.9 kg 89.6 kg 85.6 kg  ? ? ?Examination: ?General: adult female, critically ill appearing on vent ?HEENT: PERRL ?Neuro: sedated pupils reactive, +weak gag, myoclonus with minimal stimulation ?CV: tachy, RR, no m/r/g ?PULM: non-labored, equal chest rise ?GI: soft, bsx4 active ?GU: clear yellow urine  ?Extremities: warm/dry, no edema  ?Skin: no rashes or lesions ? ?Labs/imaging reviewed  ? ? ?Resolved Hospital Problem list   ? ? ?Assessment & Plan:  ? ?Prolonged OOH PEA arrest- echo benign.  Benzo overdose seems most likely cause. 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  History of abusing this. ?History of WPW- tele benign to date. Do not think this is cause of arrest. ?Acute liver injury post arrest ?Moderate Protein Calorie Malnutrition ?Anemia  ? ?-fever avoidance, brain protective measures ?-PRVC 8cc/kg as rest mode ?-VAP prevention measures ?-completed ceftriaxone ?-complex social dynamics with patients parents and surrounding custody issues - see separate progress note 5/13 ? ? ?Best Practice (right click and "Reselect all SmartList Selections" daily)  ?Diet/type: TF ?DVT prophylaxis: prophylactic  heparin  ?GI prophylaxis: PPI ?Lines: N/A ?Foley:  Yes, and it is still needed ?Code Status:  DNR ?Last date of multidisciplinary goals of care discussion: 5/13 - see IPAL note 5/13 ? ?Critical Care Time: 34 minutes  ? ?Walker Shadow  ?[place] ? ?08/24/2021, 11:18 AM ? ? ?Please see [email or web address] for pager  details.  ? ?From 7A-7P if no response, please call [phone] ?After hours, please call Warren Lacy [phone] ? ? ? ?

## 2021-08-24 NOTE — Progress Notes (Signed)
TTM therapy stopped by day shift RN.  ?

## 2021-08-24 NOTE — Progress Notes (Signed)
[facility] ADULT ICU REPLACEMENT PROTOCOL ? ? ?The patient does apply for the [facility] Adult ICU Electrolyte Replacment Protocol based on the criteria listed below:  ? ?1.Exclusion criteria: TCTS patients, ECMO patients, and Dialysis patients ?2. Is GFR >/= 30 ml/min? Yes.    ?Patient's GFR today is >60 ?3. Is SCr </= 2? Yes.   ?Patient's SCr is 0.35 mg/dL ?4. Did SCr increase >/= 0.5 in 24 hours? No. ?5.Pt's weight >40kg  Yes.   ?6. Abnormal electrolyte(s): Mag  ?7. Electrolytes replaced per protocol ?8.  Call MD STAT for K+ </= 2.5, Phos </= 1, or Mag </= 1 ?Physician:  Burnard Leigh ? ?Vicki Moore Vicki Moore 08/24/2021 4:59 AM  ?

## 2021-08-25 DIAGNOSIS — G931 Anoxic brain damage, not elsewhere classified: Secondary | ICD-10-CM

## 2021-08-25 LAB — CBC
HCT: 29.1 % — ABNORMAL LOW (ref 36.0–46.0)
Hemoglobin: 9.3 g/dL — ABNORMAL LOW (ref 12.0–15.0)
MCH: 24.2 pg — ABNORMAL LOW (ref 26.0–34.0)
MCHC: 32 g/dL (ref 30.0–36.0)
MCV: 75.8 fL — ABNORMAL LOW (ref 80.0–100.0)
Platelets: 425 10*3/uL — ABNORMAL HIGH (ref 150–400)
RBC: 3.84 MIL/uL — ABNORMAL LOW (ref 3.87–5.11)
RDW: 19.1 % — ABNORMAL HIGH (ref 11.5–15.5)
WBC: 11.2 10*3/uL — ABNORMAL HIGH (ref 4.0–10.5)
nRBC: 0 % (ref 0.0–0.2)

## 2021-08-25 LAB — BASIC METABOLIC PANEL
Anion gap: 7 (ref 5–15)
BUN: 14 mg/dL (ref 6–20)
CO2: 28 mmol/L (ref 22–32)
Calcium: 8.7 mg/dL — ABNORMAL LOW (ref 8.9–10.3)
Chloride: 97 mmol/L — ABNORMAL LOW (ref 98–111)
Creatinine, Ser: 0.4 mg/dL — ABNORMAL LOW (ref 0.44–1.00)
GFR, Estimated: >60 mL/min (ref >60)
Glucose, Bld: 110 mg/dL — ABNORMAL HIGH (ref 70–99)
Potassium: 3.9 mmol/L (ref 3.5–5.1)
Sodium: 132 mmol/L — ABNORMAL LOW (ref 135–145)

## 2021-08-25 LAB — GLUCOSE, CAPILLARY
Glucose-Capillary: 116 mg/dL — ABNORMAL HIGH (ref 70–99)
Glucose-Capillary: 119 mg/dL — ABNORMAL HIGH (ref 70–99)
Glucose-Capillary: 109 mg/dL — ABNORMAL HIGH (ref 70–99)
Glucose-Capillary: 111 mg/dL — ABNORMAL HIGH (ref 70–99)
Glucose-Capillary: 114 mg/dL — ABNORMAL HIGH (ref 70–99)

## 2021-08-25 NOTE — Progress Notes (Signed)
Nutrition Follow-up ? ?DOCUMENTATION CODES:  ? ?Not applicable ? ?INTERVENTION:  ? ?Continue Tube Feeding via OG:  ?Vital AF 1.2 at 60 ml/hr ?This provides 1728 kcals, 108 g of protein and 1166 mL of free water ? ?NUTRITION DIAGNOSIS:  ? ?Inadequate oral intake related to acute illness as evidenced by NPO status. ? ?Ongoing ? ?GOAL:  ? ?Patient will meet greater than or equal to 90% of their needs ? ?Met with TF ? ?MONITOR:  ? ?TF tolerance, Vent status, Labs, Weight trends ? ?REASON FOR ASSESSMENT:  ? ?Ventilator, Consult ?Enteral/tube feeding initiation and management ? ?ASSESSMENT:  ? ?41 yo female admitted post cardiac arrest, acute respiratory failure with aspiration pneumonia-intubated, anoxic myoclonus. UDS +benzos. PMH includes polysubstance abuse, depression, anxiety, WPW ? ?5/6 admitted after out of hospital cardiac arrest  ?5/9 MRI c/w diffuse anoxic injury ?5/11 Heavy sedation ?  ?Patient is currently intubated on ventilator support ?MV: 103 L/min ?Temp (24hrs), Avg:99.2 ?F (37.3 ?C), Min:98.6 ?F (37 ?C), Max:99.7 ?F (37.6 ?C) ? ?Propofol: 5.44 ml/hr  ? ?Reviewed I/O's: -1 L x 24 hours and -182 ml since admission ? ?UOP: 4.1 L x 24 hours (provides 144 kcals) ? ?Pt remains on deep sedation secondary to myoclonus. Pt failed SBT today.  ? ?Per CSW notes, family is planning to establish guardianship so pt's on can fly with pt's body to [place] where she wishes to be buried.  ? ?Pt continues to receive TF via OGT: Vital AF 1.2 @ 60 ml/hr.  ? ?Medications reviewed and include miralax, versed, levophed, and propofol.  ? ?Labs reviewed: Na: 132, CBGS: 95-119 (inpatient orders for glycemic control are 0-6 units insulin aspart every 4 hours).   ? ?Diet Order:   ?Diet Order   ? ? None  ? ?  ? ? ?EDUCATION NEEDS:  ? ?Not appropriate for education at this time ? ?Skin:  Skin Assessment: Reviewed RN Assessment ? ?Last BM:  08/22/21 ? ?Height:  ? ?Ht Readings from Last 1 Encounters:  ?09/04/2021 5' 3"  (1.6 m)   ? ? ?Weight:  ? ?Wt Readings from Last 1 Encounters:  ?08/25/21 (S) 82.3 kg  ? ?BMI:  Body mass index is 32.14 kg/m?. ? ?Estimated Nutritional Needs:  ? ?Kcal:  1600-1800 kcals ? ?Protein:  105-130 g ? ?Fluid:  >/= 1.8 L ? ? ? ?Loistine Chance, RD, LDN, CDCES ?Registered Dietitian II ?Certified Diabetes Care and Education Specialist ?Please refer to [facility] for RD and/or RD on-call/weekend/after hours pager  ?

## 2021-08-25 NOTE — Progress Notes (Signed)
RT NOTE: RT attempted SBT on patient this AM on CPAP/PSV of 10/5 at 0800.  Patient immediately began to have increased WOB.  Placed patient back on full support ventilator settings and is currently tolerating well.  Will continue to monitor.  ?

## 2021-08-25 NOTE — Progress Notes (Signed)
? ?NAME:  Vicki Moore, MRN:  [id number], DOB:  02/10/1981, LOS: 9 ?ADMISSION DATE:  09/04/2021, CONSULTATION DATE:  08/18/2021 ?REFERRING MD:  Dr. Melina Copa, CHIEF COMPLAINT:  Cardiac arrest   ? ?History of Present Illness:  ?Vicki Moore is a 41 y.o. with a PMH WPW, Depression, anxiety, asthma and polysubstance abuse who presented to the ED via EMS after being found unresponsive by child. Per report patient was with 10yo son when she went to the bathroom and child heard a loud thud and patient was found unresponsive eon floor. On EMS arrival patient was found in PEA arrest, ACLS protocol ran and king airway placed. Unknown if patient received bystandard CPR. 37min time between call and EMS arrival.  ? ?On ED arrival patient was seen minimally responsive (non-purposeful movement to upper extremities) with mild hypothermia and mild tachycardia. Lab work significant for K 3.0, CO2 12, Alkaline phos 172, AST 390, ALT 228, lactic 7.1, and hgb 10.5. CXR with left lung base opacities concerning for aspiration. PCCM consulted for further management and admission.  ? ?Pertinent  Medical History  ?WPW, Depression, anxiety, asthma and polysubstance abuse  ? ?Significant Hospital Events: ?Including procedures, antibiotic start and stop dates in addition to other pertinent events   ?5/6 admitted after out of hospital cardiac arrest  ?5/9 MRI c/w diffuse anoxic injury ?5/11 Heavy sedation ?5/13 ? ?Interim History / Subjective:  ?Overnight still on deep sedation due to myoclonus ? ?Objective   ?Blood pressure (!) 104/56, pulse (!) 111, temperature 99.5 ?F (37.5 ?C), resp. rate (!) 24, height 5\' 3"  (1.6 m), weight (S) 82.3 kg, SpO2 97 %. ?   ?Vent Mode: PRVC ?FiO2 (%):  [40 %] 40 % ?Set Rate:  [16 bmp] 16 bmp ?Vt Set:  [410 mL] 410 mL ?PEEP:  [5 cmH20] 5 cmH20 ?Plateau Pressure:  [17 cmH20-22 cmH20] 20 cmH20  ? ?Intake/Output Summary (Last 24 hours) at 08/25/2021 1030 ?Last data filed at 08/25/2021 0800 ?Gross per 24 hour   ?Intake 2983 ml  ?Output 3605 ml  ?Net -622 ml  ? ?Filed Weights  ? 08/22/21 0500 08/23/21 0651 08/25/21 0337  ?Weight: 89.6 kg 85.6 kg (S) 82.3 kg  ? ? ?Examination: ?Gen:      Intubated, sedated, acutely ill appearing ?HEENT:  ETT to vent ?Lungs:    sounds of mechanical ventilation auscultated  ?CV:         RRR no mrg ?Abd:      + bowel sounds; soft, non-tender; no palpable masses, no distension ?Ext:    Mildly edematous ?Skin:      Warm and dry; no rashes ?Neuro:   deeply sedated due to myoclonus on propofol, versed, fentanyl. Not responsive.  ? ? ? ?Resolved Hospital Problem list   ?Acute liver injury post arrest ? ?Assessment & Plan:  ? ?Prolonged OOH PEA arrest-  ?History of WPW ?Anoxic myoclonus ?Anoxic encephalopathy ?- VEEG neg and with MRI findings consistent with anoxic encephalopathy ?- s/p TTM ?-fever avoidance, brain protective measures ?-echo benign.  Benzo overdose seems most likely cause. ? ?Circulatory Shock ?- likely secondary to drugs from sedation ? ?Acute hypoxemic respiratory failure, aspiration pneumonia ?History of polysubstance abuse ?-PRVC 8cc/kg as rest mode ?-VAP prevention measures ?-completed ceftriaxone ? ?Moderate Protein Calorie Malnutrition ?Anemia  ? ?Goals of Care ?DNR. Per ipal meeting this weekend family had concerns about patient's son  being in foster care. Seems like this should be irrelevant if he has legal NOK to take care of  him. Will clarify with social work.  ? ? ?Best Practice (right click and "Reselect all SmartList Selections" daily)  ?Diet/type: TF ?DVT prophylaxis: prophylactic heparin  ?GI prophylaxis: PPI ?Lines: N/A ?Foley:  Yes, and it is still needed ?Code Status:  DNR ?Last date of multidisciplinary goals of care discussion: 5/13 - see IPAL note 5/13. Will update again today.  ? ?Critical Care Time: 39 minutes  ? ?The patient is critically ill due to cardiac arrest, shock, respiratory failure, encephalopathy.  Critical care was necessary to treat or prevent  imminent or life-threatening deterioration.  Critical care was time spent personally by me on the following activities: development of treatment plan with patient and/or surrogate as well as nursing, discussions with consultants, evaluation of patient's response to treatment, examination of patient, obtaining history from patient or surrogate, ordering and performing treatments and interventions, ordering and review of laboratory studies, ordering and review of radiographic studies, pulse oximetry, re-evaluation of patient's condition and participation in multidisciplinary rounds.  ? ?Critical Care Time devoted to patient care services described in this note is 39 minutes. This time reflects time of care of this [place] . This critical care time does not reflect separately billable procedures or procedure time, teaching time or supervisory time of PA/NP/Med student/Med Resident etc but could involve care discussion time.   ?   ? ?Spero Geralds ?[facility] ?08/25/2021 10:40 AM  ?Pager: see AMION ? ?If no response to pager , please call critical care on call (see AMION) until 7pm ?After 7:00 pm call Elink   ? ? ? ?

## 2021-08-26 LAB — BASIC METABOLIC PANEL
Anion gap: 7 (ref 5–15)
BUN: 16 mg/dL (ref 6–20)
CO2: 27 mmol/L (ref 22–32)
Calcium: 9.1 mg/dL (ref 8.9–10.3)
Chloride: 103 mmol/L (ref 98–111)
Creatinine, Ser: 0.43 mg/dL — ABNORMAL LOW (ref 0.44–1.00)
GFR, Estimated: >60 mL/min (ref >60)
Glucose, Bld: 84 mg/dL (ref 70–99)
Potassium: 3.9 mmol/L (ref 3.5–5.1)
Sodium: 137 mmol/L (ref 135–145)

## 2021-08-26 LAB — GLUCOSE, CAPILLARY
Glucose-Capillary: 108 mg/dL — ABNORMAL HIGH (ref 70–99)
Glucose-Capillary: 113 mg/dL — ABNORMAL HIGH (ref 70–99)
Glucose-Capillary: 114 mg/dL — ABNORMAL HIGH (ref 70–99)
Glucose-Capillary: 80 mg/dL (ref 70–99)
Glucose-Capillary: 91 mg/dL (ref 70–99)
Glucose-Capillary: 91 mg/dL (ref 70–99)

## 2021-08-26 LAB — TRIGLYCERIDES: Triglycerides: 335 mg/dL — ABNORMAL HIGH (ref <150)

## 2021-08-26 LAB — CBC
HCT: 28.5 % — ABNORMAL LOW (ref 36.0–46.0)
Hemoglobin: 8.6 g/dL — ABNORMAL LOW (ref 12.0–15.0)
MCH: 23.3 pg — ABNORMAL LOW (ref 26.0–34.0)
MCHC: 30.2 g/dL (ref 30.0–36.0)
MCV: 77.2 fL — ABNORMAL LOW (ref 80.0–100.0)
Platelets: 370 10*3/uL (ref 150–400)
RBC: 3.69 MIL/uL — ABNORMAL LOW (ref 3.87–5.11)
RDW: 19.3 % — ABNORMAL HIGH (ref 11.5–15.5)
WBC: 6.8 10*3/uL (ref 4.0–10.5)
nRBC: 0 % (ref 0.0–0.2)

## 2021-08-26 MED ORDER — IPRATROPIUM-ALBUTEROL 0.5-2.5 (3) MG/3ML IN SOLN: 3.0000 mL | Freq: Four times a day (QID) | RESPIRATORY_TRACT | Status: DC | PRN

## 2021-08-26 NOTE — Progress Notes (Signed)
?  08/26/21 1645  ?Clinical Encounter Type  ?Visited With Family ?(Patient's Mother)  ?Visit Type Follow-up;Critical Care  ?Referral From Chaplain  ?Consult/Referral To Chaplain ?Albertina Parr [place])  ?Spiritual Encounters  ?Spiritual Needs Prayer;Emotional;Grief support  ? ?Met with patient's mother in [facility] Main Lobby. ?Planning parental custody hearing tomorrow. ?Patient's father is home with illness. ?Will follow up on 08/27/21.[address] [place], [place]., [phone]  ?

## 2021-08-26 NOTE — Progress Notes (Addendum)
? ?NAME:  Vicki Moore, MRN:  [id number], DOB:  14-Jul-1980, LOS: 10 ?ADMISSION DATE:  21-Aug-2021, CONSULTATION DATE:  Aug 21, 2021 ?REFERRING MD:  Dr. Charm Barges, CHIEF COMPLAINT:  Cardiac arrest   ? ?History of Present Illness:  ?Vicki Moore is a 41 y.o. with a PMH WPW, Depression, anxiety, asthma and polysubstance abuse who presented to the ED via EMS after being found unresponsive by child. Per report patient was with 10yo son when she went to the bathroom and child heard a loud thud and patient was found unresponsive eon floor. On EMS arrival patient was found in PEA arrest, ACLS protocol ran and king airway placed. Unknown if patient received bystandard CPR. time between call and EMS arrival.  ? ?On ED arrival patient was seen minimally responsive (non-purposeful movement to upper extremities) with mild hypothermia and mild tachycardia. Lab work significant for K 3.0, CO2 12, Alkaline phos 172, AST 390, ALT 228, lactic 7.1, and hgb 10.5. CXR with left lung base opacities concerning for aspiration. PCCM consulted for further management and admission.  ? ?Pertinent  Medical History  ?WPW, Depression, anxiety, asthma and polysubstance abuse  ? ?Significant Hospital Events: ?Including procedures, antibiotic start and stop dates in addition to other pertinent events   ?5/6 admitted after out of hospital cardiac arrest  ?5/9 MRI c/w diffuse anoxic injury ?5/11 Heavy sedation ?5/13 ? ?Interim History / Subjective:  ?No overnight issues ?Remains deeply sedated ?Tube feeds at goal ? ?Objective   ?Blood pressure 112/62, pulse (!) 111, temperature 99.5 ?F (37.5 ?C), resp. rate 19, height 5\' 3"  (1.6 m), weight 81.6 kg, SpO2 96 %. ?   ?Vent Mode: PRVC ?FiO2 (%):  [40 %] 40 % ?Set Rate:  [16 bmp] 16 bmp ?Vt Set:  [410 mL] 410 mL ?PEEP:  [5 cmH20] 5 cmH20 ?Plateau Pressure:  [15 cmH20-19 cmH20] 15 cmH20  ? ?Intake/Output Summary (Last 24 hours) at 08/26/2021 1212 ?Last data filed at 08/26/2021 1135 ?Gross per 24 hour   ?Intake 3239.88 ml  ?Output 2515 ml  ?Net 724.88 ml  ? ?Filed Weights  ? 08/23/21 0651 08/25/21 0337 08/26/21 0500  ?Weight: 85.6 kg (S) 82.3 kg 81.6 kg  ? ? ?Examination: ?Intubated, sedated not responsive ? ? ? ?Resolved Hospital Problem list   ?Acute liver injury post arrest ? ?Assessment & Plan:  ? ?Prolonged OOH PEA arrest ?Anoxic encephalopathy ?- with refractory myoclonuse requiring propofol, versed and fentanyl to suppress.  ?- VEEG neg and with MRI findings consistent with anoxic encephalopathy ?- s/p TTM ? ?Circulatory Shock ?- likely secondary to drugs from sedation ? ?Acute hypoxemic respiratory failure, aspiration pneumonia ?History of polysubstance abuse ?-PRVC 8cc/kg as rest mode ?-VAP prevention measures ? ?Moderate Protein Calorie Malnutrition ?Anemia  ? ?Goals of Care ?Plan for transition to comfort measures Wednesday per family pending some guardianship meetings for the patient's son.  ? ? ?Best Practice (right click and "Reselect all SmartList Selections" daily)  ?Diet/type: TF ?DVT prophylaxis: prophylactic heparin  ?GI prophylaxis: PPI ?Lines: N/A ?Foley:  Yes, and it is still needed ?Code Status:  DNR ?Last date of multidisciplinary goals of care discussion: 5/15 discussed with patient's parents at bedside. Plan is for transition to comfort measures and proceed with organ donation.  ? ?Critical Care Time: 32 minutes  ? ?The patient is critically ill due to respiratory failure, anoxic encephalopathy.  Critical care was necessary to treat or prevent imminent or life-threatening deterioration.  Critical care was time spent personally by me on  the following activities: development of treatment plan with patient and/or surrogate as well as nursing, discussions with consultants, evaluation of patient's response to treatment, examination of patient, obtaining history from patient or surrogate, ordering and performing treatments and interventions, ordering and review of laboratory studies, ordering  and review of radiographic studies, pulse oximetry, re-evaluation of patient's condition and participation in multidisciplinary rounds.  ? ?Critical Care Time devoted to patient care services described in this note is 32 minutes. This time reflects time of care of this signee Charlott Holler . This critical care time does not reflect separately billable procedures or procedure time, teaching time or supervisory time of PA/NP/Med student/Med Resident etc but could involve care discussion time.   ?   ? ?Charlott Holler ? Pulmonary and Critical Care Medicine ?08/26/2021 12:12 PM  ?Pager: see AMION ? ?If no response to pager , please call critical care on call (see AMION) until 7pm ?After 7:00 pm call Elink   ? ? ? ?

## 2021-08-27 LAB — BASIC METABOLIC PANEL
Anion gap: 7 (ref 5–15)
BUN: 15 mg/dL (ref 6–20)
CO2: 29 mmol/L (ref 22–32)
Calcium: 9.3 mg/dL (ref 8.9–10.3)
Chloride: 101 mmol/L (ref 98–111)
Creatinine, Ser: 0.39 mg/dL — ABNORMAL LOW (ref 0.44–1.00)
GFR, Estimated: >60 mL/min (ref >60)
Glucose, Bld: 98 mg/dL (ref 70–99)
Potassium: 3.9 mmol/L (ref 3.5–5.1)
Sodium: 137 mmol/L (ref 135–145)

## 2021-08-27 LAB — CBC
HCT: 29.3 % — ABNORMAL LOW (ref 36.0–46.0)
Hemoglobin: 8.9 g/dL — ABNORMAL LOW (ref 12.0–15.0)
MCH: 23.3 pg — ABNORMAL LOW (ref 26.0–34.0)
MCHC: 30.4 g/dL (ref 30.0–36.0)
MCV: 76.7 fL — ABNORMAL LOW (ref 80.0–100.0)
Platelets: 389 10*3/uL (ref 150–400)
RBC: 3.82 MIL/uL — ABNORMAL LOW (ref 3.87–5.11)
RDW: 19.4 % — ABNORMAL HIGH (ref 11.5–15.5)
WBC: 8.3 10*3/uL (ref 4.0–10.5)
nRBC: 0 % (ref 0.0–0.2)

## 2021-08-27 LAB — GLUCOSE, CAPILLARY
Glucose-Capillary: 100 mg/dL — ABNORMAL HIGH (ref 70–99)
Glucose-Capillary: 101 mg/dL — ABNORMAL HIGH (ref 70–99)
Glucose-Capillary: 101 mg/dL — ABNORMAL HIGH (ref 70–99)
Glucose-Capillary: 108 mg/dL — ABNORMAL HIGH (ref 70–99)
Glucose-Capillary: 108 mg/dL — ABNORMAL HIGH (ref 70–99)
Glucose-Capillary: 113 mg/dL — ABNORMAL HIGH (ref 70–99)
Glucose-Capillary: 119 mg/dL — ABNORMAL HIGH (ref 70–99)

## 2021-08-27 NOTE — Progress Notes (Signed)
? ?NAME:  Vicki Moore, MRN:  [id number], DOB:  1980-07-28, LOS: 64 ?ADMISSION DATE:  09/07/2021, CONSULTATION DATE:  09/03/2021 ?REFERRING MD:  Dr. Melina Copa, CHIEF COMPLAINT:  Cardiac arrest   ? ?History of Present Illness:  ?Vicki Moore is a 41 y.o. with a PMH WPW, Depression, anxiety, asthma and polysubstance abuse who presented to the ED via EMS after being found unresponsive by child. Per report patient was with 10yo son when she went to the bathroom and child heard a loud thud and patient was found unresponsive eon floor. On EMS arrival patient was found in PEA arrest, ACLS protocol ran and king airway placed. Unknown if patient received bystandard CPR. 50min time between call and EMS arrival.  ? ?On ED arrival patient was seen minimally responsive (non-purposeful movement to upper extremities) with mild hypothermia and mild tachycardia. Lab work significant for K 3.0, CO2 12, Alkaline phos 172, AST 390, ALT 228, lactic 7.1, and hgb 10.5. CXR with left lung base opacities concerning for aspiration. PCCM consulted for further management and admission.  ? ?Pertinent  Medical History  ?WPW, Depression, anxiety, asthma and polysubstance abuse  ? ?Significant Hospital Events: ?Including procedures, antibiotic start and stop dates in addition to other pertinent events   ?5/6 admitted after out of hospital cardiac arrest  ?5/9 MRI c/w diffuse anoxic injury ?5/11 Heavy sedation ?5/13 ? ?Interim History / Subjective:  ?No overnight issues ?Remains deeply sedated ?Tube feeds at goal ? ?Objective   ?Blood pressure 113/69, pulse 98, temperature 99.5 ?F (37.5 ?C), resp. rate 18, height 5\' 3"  (1.6 m), weight 83.6 kg, SpO2 95 %. ?   ?Vent Mode: PRVC ?FiO2 (%):  [40 %] 40 % ?Set Rate:  [16 bmp] 16 bmp ?Vt Set:  [410 mL] 410 mL ?PEEP:  [5 cmH20] 5 cmH20 ?Plateau Pressure:  [9 cmH20-23 cmH20] 23 cmH20  ? ?Intake/Output Summary (Last 24 hours) at 08/27/2021 1025 ?Last data filed at 08/27/2021 0900 ?Gross per 24 hour  ?Intake  2742.6 ml  ?Output 2060 ml  ?Net 682.6 ml  ? ?Filed Weights  ? 08/25/21 0337 08/26/21 0500 08/27/21 0500  ?Weight: (S) 82.3 kg 81.6 kg 83.6 kg  ? ? ?Examination: ?Intubated, sedated not responsive ? ? ? ?Resolved Hospital Problem list   ?Acute liver injury post arrest ? ?Assessment & Plan:  ? ?Prolonged OOH PEA arrest ?Anoxic encephalopathy ?- with refractory myoclonuse requiring propofol, versed and fentanyl to suppress.  ?- VEEG neg and with MRI findings consistent with anoxic encephalopathy ?- s/p TTM ? ?Circulatory Shock ?- likely secondary to drugs from sedation ? ?Acute hypoxemic respiratory failure, aspiration pneumonia ?History of polysubstance abuse ?-PRVC 8cc/kg as rest mode ?-VAP prevention measures ? ?Moderate Protein Calorie Malnutrition ?Anemia  ? ?Goals of Care ?Plan for transition to comfort measures Wednesday per family pending some guardianship meetings for the patient's son.  ? ? ?Best Practice (right click and "Reselect all SmartList Selections" daily)  ?Diet/type: TF ?DVT prophylaxis: prophylactic heparin  ?GI prophylaxis: PPI ?Lines: N/A ?Foley:  Yes, and it is still needed ?Code Status:  DNR ?Last date of multidisciplinary goals of care discussion: 5/15 discussed with patient's parents at bedside. Plan is for transition to comfort measures and proceed with organ donation following a guardianship meeting on 5/17.  ? ?Critical Care Time: 33 minutes  ? ?The patient is critically ill due to respiratory failure, anoxic encephalopathy, cardiac arrest.  Critical care was necessary to treat or prevent imminent or life-threatening deterioration.  Critical care  was time spent personally by me on the following activities: development of treatment plan with patient and/or surrogate as well as nursing, discussions with consultants, evaluation of patient's response to treatment, examination of patient, obtaining history from patient or surrogate, ordering and performing treatments and interventions,  ordering and review of laboratory studies, ordering and review of radiographic studies, pulse oximetry, re-evaluation of patient's condition and participation in multidisciplinary rounds.  ? ?Critical Care Time devoted to patient care services described in this note is 33 minutes. This time reflects time of care of this Susan Moore . This critical care time does not reflect separately billable procedures or procedure time, teaching time or supervisory time of PA/NP/Med student/Med Resident etc but could involve care discussion time.   ?   ? ?Spero Geralds ?[place] [facility] ?08/27/2021 10:25 AM  ?Pager: see AMION ? ?If no response to pager , please call critical care on call (see AMION) until 7pm ?After 7:00 pm call Elink   ? ? ? ? ? ?

## 2021-08-27 NOTE — Progress Notes (Incomplete)
?   08/27/21 1455  ?Clinical Encounter Type  ?Visited With Patient  ?Visit Type Critical Care;Follow-up  ?Referral From Other (Comment) ?(Rounding)  ?Consult/Referral To Chaplain ?Gelene Mink [employer])  ? ? ?

## 2021-08-27 NOTE — Progress Notes (Signed)
?   08/27/21 1455  ?Clinical Encounter Type  ?Visited With Patient  ?Visit Type Critical Care;Follow-up  ?Referral From Other (Comment) ?(Rounding)  ?Consult/Referral To Chaplain ?Albertina Parr [place])  ? ?Attempted follow up visit with Murray Hodgkins and Burman Riis. Family not present at this time. ?

## 2021-08-28 ENCOUNTER — Inpatient Hospital Stay (HOSPITAL_COMMUNITY): Payer: Medicaid Other

## 2021-08-28 DIAGNOSIS — R0600 Dyspnea, unspecified: Secondary | ICD-10-CM | POA: Diagnosis not present

## 2021-08-28 DIAGNOSIS — K915 Postcholecystectomy syndrome: Secondary | ICD-10-CM | POA: Diagnosis not present

## 2021-08-28 DIAGNOSIS — Z4682 Encounter for fitting and adjustment of non-vascular catheter: Secondary | ICD-10-CM | POA: Diagnosis not present

## 2021-08-28 DIAGNOSIS — J90 Pleural effusion, not elsewhere classified: Secondary | ICD-10-CM | POA: Diagnosis not present

## 2021-08-28 DIAGNOSIS — R079 Chest pain, unspecified: Secondary | ICD-10-CM | POA: Diagnosis not present

## 2021-08-28 DIAGNOSIS — J9811 Atelectasis: Secondary | ICD-10-CM | POA: Diagnosis not present

## 2021-08-28 LAB — GLUCOSE, CAPILLARY
Glucose-Capillary: 97 mg/dL (ref 70–99)
Glucose-Capillary: 121 mg/dL — ABNORMAL HIGH (ref 70–99)
Glucose-Capillary: 108 mg/dL — ABNORMAL HIGH (ref 70–99)
Glucose-Capillary: 102 mg/dL — ABNORMAL HIGH (ref 70–99)
Glucose-Capillary: 100 mg/dL — ABNORMAL HIGH (ref 70–99)

## 2021-08-28 LAB — COMPREHENSIVE METABOLIC PANEL
ALT: 15 U/L (ref 0–44)
AST: 28 U/L (ref 15–41)
Albumin: 2.7 g/dL — ABNORMAL LOW (ref 3.5–5.0)
Alkaline Phosphatase: 114 U/L (ref 38–126)
Anion gap: 8 (ref 5–15)
BUN: 16 mg/dL (ref 6–20)
CO2: 28 mmol/L (ref 22–32)
Calcium: 9.1 mg/dL (ref 8.9–10.3)
Chloride: 99 mmol/L (ref 98–111)
Creatinine, Ser: 0.38 mg/dL — ABNORMAL LOW (ref 0.44–1.00)
GFR, Estimated: >60 mL/min (ref >60)
Glucose, Bld: 91 mg/dL (ref 70–99)
Potassium: 3.8 mmol/L (ref 3.5–5.1)
Sodium: 135 mmol/L (ref 135–145)
Total Bilirubin: 0.5 mg/dL (ref 0.3–1.2)
Total Protein: 6.8 g/dL (ref 6.5–8.1)

## 2021-08-28 LAB — TYPE AND SCREEN
ABO/RH(D): AB POS
Antibody Screen: NEGATIVE
PT AG Type: NEGATIVE

## 2021-08-28 LAB — LIPASE, BLOOD: Lipase: 45 U/L (ref 11–51)

## 2021-08-28 LAB — CK TOTAL AND CKMB (NOT AT ARMC)
CK, MB: 7.4 ng/mL — ABNORMAL HIGH (ref 0.5–5.0)
Relative Index: INVALID (ref 0.0–2.5)
Total CK: 56 U/L (ref 38–234)

## 2021-08-28 LAB — BASIC METABOLIC PANEL
Anion gap: 10 (ref 5–15)
BUN: 16 mg/dL (ref 6–20)
CO2: 27 mmol/L (ref 22–32)
Calcium: 9.4 mg/dL (ref 8.9–10.3)
Chloride: 102 mmol/L (ref 98–111)
Creatinine, Ser: 0.38 mg/dL — ABNORMAL LOW (ref 0.44–1.00)
GFR, Estimated: >60 mL/min (ref >60)
Glucose, Bld: 95 mg/dL (ref 70–99)
Potassium: 3.9 mmol/L (ref 3.5–5.1)
Sodium: 139 mmol/L (ref 135–145)

## 2021-08-28 LAB — HCG, SERUM, QUALITATIVE: Preg, Serum: NEGATIVE

## 2021-08-28 LAB — CBC
HCT: 30.9 % — ABNORMAL LOW (ref 36.0–46.0)
HCT: 29.2 % — ABNORMAL LOW (ref 36.0–46.0)
Hemoglobin: 9.3 g/dL — ABNORMAL LOW (ref 12.0–15.0)
Hemoglobin: 8.9 g/dL — ABNORMAL LOW (ref 12.0–15.0)
MCH: 23.4 pg — ABNORMAL LOW (ref 26.0–34.0)
MCH: 23.5 pg — ABNORMAL LOW (ref 26.0–34.0)
MCHC: 30.1 g/dL (ref 30.0–36.0)
MCHC: 30.5 g/dL (ref 30.0–36.0)
MCV: 77.6 fL — ABNORMAL LOW (ref 80.0–100.0)
MCV: 77.2 fL — ABNORMAL LOW (ref 80.0–100.0)
Platelets: 488 10*3/uL — ABNORMAL HIGH (ref 150–400)
Platelets: 451 10*3/uL — ABNORMAL HIGH (ref 150–400)
RBC: 3.98 MIL/uL (ref 3.87–5.11)
RBC: 3.78 MIL/uL — ABNORMAL LOW (ref 3.87–5.11)
RDW: 19.2 % — ABNORMAL HIGH (ref 11.5–15.5)
RDW: 19 % — ABNORMAL HIGH (ref 11.5–15.5)
WBC: 11.4 10*3/uL — ABNORMAL HIGH (ref 4.0–10.5)
WBC: 9.5 10*3/uL (ref 4.0–10.5)
nRBC: 0 % (ref 0.0–0.2)
nRBC: 0 % (ref 0.0–0.2)

## 2021-08-28 LAB — AMYLASE: Amylase: 34 U/L (ref 28–100)

## 2021-08-28 LAB — RESP PANEL BY RT-PCR (FLU A&B, COVID) ARPGX2
Influenza A by PCR: NEGATIVE
Influenza B by PCR: NEGATIVE
SARS Coronavirus 2 by RT PCR: NEGATIVE

## 2021-08-28 LAB — TROPONIN I (HIGH SENSITIVITY): Troponin I (High Sensitivity): 5 ng/L (ref <18)

## 2021-08-28 LAB — PROTIME-INR
INR: 1 (ref 0.8–1.2)
Prothrombin Time: 12.9 seconds (ref 11.4–15.2)

## 2021-08-28 LAB — BILIRUBIN, DIRECT: Bilirubin, Direct: <0.1 mg/dL (ref 0.0–0.2)

## 2021-08-28 LAB — APTT: aPTT: 28 seconds (ref 24–36)

## 2021-08-28 MED ORDER — SODIUM CHLORIDE 0.9 % IV SOLN: INTRAVENOUS | Status: DC | PRN

## 2021-08-28 NOTE — Progress Notes (Signed)
RT transported patient from 2H20 to CT and back with RN. No complications. RT will continue to monitor.

## 2021-08-28 NOTE — Procedures (Signed)
Arterial Catheter Insertion Procedure Note  Vicki Moore  [id number]  1981/02/05  Date:08/28/21  Time:9:24 PM    Provider Performing: Janett Billow C    Procedure: Insertion of Arterial Line (60109) without US guidance  Indication(s) Blood pressure monitoring and/or need for frequent ABGs  Consent Unable to obtain consent due to emergent nature of procedure.  Anesthesia None   Time Out Verified patient identification, verified procedure, site/side was marked, verified correct patient position, special equipment/implants available, medications/allergies/relevant history reviewed, required imaging and test results available.   Sterile Technique Maximal sterile technique including full sterile barrier drape, hand hygiene, sterile gown, sterile gloves, mask, hair covering, sterile ultrasound probe cover (if used).   Procedure Description Area of catheter insertion was cleaned with chlorhexidine and draped in sterile fashion. Without real-time ultrasound guidance an arterial catheter was placed into the left radial artery.  Appropriate arterial tracings confirmed on monitor.     Complications/Tolerance None; patient tolerated the procedure well.   EBL Minimal   Specimen(s) None

## 2021-08-28 NOTE — Progress Notes (Signed)
NAME:  Vicki Moore, MRN:  [id number], DOB:  March 10, 1981, LOS: 35 ADMISSION DATE:  08/15/2021, CONSULTATION DATE:  08/24/2021 REFERRING MD:  Dr. Melina Copa, CHIEF COMPLAINT:  Cardiac arrest    History of Present Illness:  Vicki Moore is a 41 y.o. with a PMH WPW, Depression, anxiety, asthma and polysubstance abuse who presented to the ED via EMS after being found unresponsive by child. Per report patient was with 10yo son when she went to the bathroom and child heard a loud thud and patient was found unresponsive eon floor. On EMS arrival patient was found in PEA arrest, ACLS protocol ran and king airway placed. Unknown if patient received bystandard CPR. 58mn time between call and EMS arrival.   On ED arrival patient was seen minimally responsive (non-purposeful movement to upper extremities) with mild hypothermia and mild tachycardia. Lab work significant for K 3.0, CO2 12, Alkaline phos 172, AST 390, ALT 228, lactic 7.1, and hgb 10.5. CXR with left lung base opacities concerning for aspiration. PCCM consulted for further management and admission.   Pertinent  Medical History  WPW, Depression, anxiety, asthma and polysubstance abuse   Significant Hospital Events: Including procedures, antibiotic start and stop dates in addition to other pertinent events   5/6 admitted after out of hospital cardiac arrest  5/9 MRI c/w diffuse anoxic injury 5/11 Heavy sedation 5/15 met with family who is asking for more time.   Interim History / Subjective:  No overnight issues.   Objective   Blood pressure 112/71, pulse (!) 103, temperature 99.5 F (37.5 C), resp. rate 19, height _0  (1.6 m), weight 80.6 kg, SpO2 98 %.    Vent Mode: PRVC FiO2 (%):  [40 %] 40 % Set Rate:  [16 bmp] 16 bmp Vt Set:  [410 mL] 410 mL PEEP:  [5 cmH20] 5 cmH20 Plateau Pressure:  [17 cmH20-24 cmH20] 17 cmH20   Intake/Output Summary (Last 24 hours) at 08/28/2021 0929 Last data filed at 08/28/2021 0910 Gross per 24 hour   Intake 2366.07 ml  Output 2150 ml  Net 216.07 ml   Filed Weights   08/26/21 0500 08/27/21 0500 08/28/21 0500  Weight: 81.6 kg 83.6 kg 80.6 kg    Examination: Intubated, sedated, not responsive Acutely ill Myoclonus suppressed with three sedating agents Clonus worsens with care per nursing.     Resolved Hospital Problem list   Acute liver injury post arrest  Assessment & Plan:   Prolonged OOH PEA arrest Anoxic encephalopathy - with refractory myoclonuse requiring propofol, versed and fentanyl to suppress.  - VEEG neg and with MRI findings consistent with anoxic encephalopathy - s/p TTM  Circulatory Shock - likely secondary to drugs from sedation  Acute hypoxemic respiratory failure, aspiration pneumonia History of polysubstance abuse -PRVC 8cc/kg as rest mode -VAP prevention measures  Moderate Protein Calorie Malnutrition Anemia   Goals of Care See below.    Best Practice (right click and "Reselect all SmartList Selections" daily)  Diet/type: TF DVT prophylaxis: prophylactic heparin  GI prophylaxis: PPI Lines: N/A Foley:  Yes, and it is still needed Code Status:  DNR Last date of multidisciplinary goals of care discussion: 5/18 I called the patient's parents Vicki Moore and Vicki Moore and expressed my concern that Louvinia is suffering and we should probably start focusing on her comfort. He will communicate to the rest of the family and they will come in.   Critical Care Time: 45 minutes   The patient is critically ill due to anoxic encephalopathy, respiratory  failure.  Critical care was necessary to treat or prevent imminent or life-threatening deterioration.  Critical care was time spent personally by me on the following activities: development of treatment plan with patient and/or surrogate as well as nursing, discussions with consultants, evaluation of patient's response to treatment, examination of patient, obtaining history from patient or surrogate, ordering and  performing treatments and interventions, ordering and review of laboratory studies, ordering and review of radiographic studies, pulse oximetry, re-evaluation of patient's condition and participation in multidisciplinary rounds.   Critical Care Time devoted to patient care services described in this note is 45 minutes. This time reflects time of care of this [place] . This critical care time does not reflect separately billable procedures or procedure time, teaching time or supervisory time of PA/NP/Med student/Med Resident etc but could involve care discussion time.       Spero Geralds [place] [facility] 08/28/2021 9:30 AM  Pager: see AMION  If no response to pager , please call critical care on call (see AMION) until 7pm After 7:00 pm call Elink

## 2021-08-28 NOTE — Progress Notes (Signed)
This chaplain responded to RN-Katie's page to update the Pt. family on the priest visit and to provide on going spiritual care.    The chaplain shared the Pt. family priest from [place]. [employer] was present for the [employer] [facility] Sick" also called  "Last Rites".  The family accepted the chaplain's redirection for contacting the priest back to the family.  The family joined the chaplain in the sharing of  Psalm 23 with the Pt. at the bedside. The family was appreciative of the visit and the chaplain's willingness to talk to the Pt. son.  Chaplain Stephanie Acre [phone]

## 2021-08-28 NOTE — Plan of Care (Signed)
  Problem: Clinical Measurements: Goal: Will remain free from infection 08/28/2021 2035 by Clovis Fredrickson, RN Outcome: Progressing 08/28/2021 2034 by Clovis Fredrickson, RN Outcome: Progressing   Problem: Nutrition: Goal: Adequate nutrition will be maintained 08/28/2021 2035 by Clovis Fredrickson, RN Outcome: Progressing 08/28/2021 2034 by Clovis Fredrickson, RN Outcome: Progressing   Problem: Pain Managment: Goal: General experience of comfort will improve 08/28/2021 2035 by Clovis Fredrickson, RN Outcome: Progressing 08/28/2021 2034 by Clovis Fredrickson, RN Outcome: Progressing   Problem: Skin Integrity: Goal: Risk for impaired skin integrity will decrease 08/28/2021 2035 by Clovis Fredrickson, RN Outcome: Progressing 08/28/2021 2034 by Clovis Fredrickson, RN Outcome: Progressing

## 2021-08-28 NOTE — Progress Notes (Signed)
CH received page from [facility] secretary sharing that pt.'s RN had called requesting assistance coordinating Last Rites on family's behalf.  CH visited pt.'s room to further evaluate situation and needs; pt. lying in bed intubated and not responsive to verbal stimuli at this time; RN briefly shared the history of pt.'s current admission to [facility]; CH called pt.'s parents and confirmed they would like a priest to come administer last rites; family are members of [employer] in [place] [facility] called Architect requesting priest's visit and was awaiting promised return call before updating family; CH received call from Diplomatic [employer] operational officer approx. 1hr later sharing that a priest had come to [facility] to pray for pt. and that family were not present at this time.  Chaplains are available via page to support family today as needed.  Elpidio Anis, Chaplain Pager: [phone]

## 2021-08-29 LAB — BLOOD CULTURE ID PANEL (REFLEXED) - BCID2

## 2021-08-29 LAB — COMPREHENSIVE METABOLIC PANEL
ALT: 17 U/L (ref 0–44)
ALT: 15 U/L (ref 0–44)
AST: 27 U/L (ref 15–41)
AST: 26 U/L (ref 15–41)
Albumin: 2.8 g/dL — ABNORMAL LOW (ref 3.5–5.0)
Albumin: 2.7 g/dL — ABNORMAL LOW (ref 3.5–5.0)
Alkaline Phosphatase: 123 U/L (ref 38–126)
Alkaline Phosphatase: 116 U/L (ref 38–126)
Anion gap: 9 (ref 5–15)
Anion gap: 10 (ref 5–15)
BUN: 15 mg/dL (ref 6–20)
BUN: 13 mg/dL (ref 6–20)
CO2: 28 mmol/L (ref 22–32)
CO2: 28 mmol/L (ref 22–32)
Calcium: 9.8 mg/dL (ref 8.9–10.3)
Calcium: 9 mg/dL (ref 8.9–10.3)
Chloride: 99 mmol/L (ref 98–111)
Chloride: 98 mmol/L (ref 98–111)
Creatinine, Ser: 0.41 mg/dL — ABNORMAL LOW (ref 0.44–1.00)
Creatinine, Ser: 0.39 mg/dL — ABNORMAL LOW (ref 0.44–1.00)
GFR, Estimated: >60 mL/min (ref >60)
GFR, Estimated: >60 mL/min (ref >60)
Glucose, Bld: 101 mg/dL — ABNORMAL HIGH (ref 70–99)
Glucose, Bld: 92 mg/dL (ref 70–99)
Potassium: 3.9 mmol/L (ref 3.5–5.1)
Potassium: 3.9 mmol/L (ref 3.5–5.1)
Sodium: 136 mmol/L (ref 135–145)
Sodium: 136 mmol/L (ref 135–145)
Total Bilirubin: 0.2 mg/dL — ABNORMAL LOW (ref 0.3–1.2)
Total Bilirubin: 0.4 mg/dL (ref 0.3–1.2)
Total Protein: 7.3 g/dL (ref 6.5–8.1)
Total Protein: 6.8 g/dL (ref 6.5–8.1)

## 2021-08-29 LAB — URINALYSIS, ROUTINE W REFLEX MICROSCOPIC
Bilirubin Urine: NEGATIVE
Bilirubin Urine: NEGATIVE
Glucose, UA: NEGATIVE mg/dL
Glucose, UA: NEGATIVE mg/dL
Hgb urine dipstick: NEGATIVE
Hgb urine dipstick: NEGATIVE
Ketones, ur: NEGATIVE mg/dL
Ketones, ur: NEGATIVE mg/dL
Leukocytes,Ua: NEGATIVE
Nitrite: NEGATIVE
Nitrite: NEGATIVE
Protein, ur: NEGATIVE mg/dL
Protein, ur: NEGATIVE mg/dL
Specific Gravity, Urine: 1.015 (ref 1.005–1.030)
Specific Gravity, Urine: 1.016 (ref 1.005–1.030)
pH: 5 (ref 5.0–8.0)
pH: 6 (ref 5.0–8.0)

## 2021-08-29 LAB — GLUCOSE, CAPILLARY
Glucose-Capillary: 107 mg/dL — ABNORMAL HIGH (ref 70–99)
Glucose-Capillary: 111 mg/dL — ABNORMAL HIGH (ref 70–99)
Glucose-Capillary: 84 mg/dL (ref 70–99)
Glucose-Capillary: 87 mg/dL (ref 70–99)
Glucose-Capillary: 93 mg/dL (ref 70–99)
Glucose-Capillary: 93 mg/dL (ref 70–99)
Glucose-Capillary: 99 mg/dL (ref 70–99)

## 2021-08-29 LAB — CBC
HCT: 30.5 % — ABNORMAL LOW (ref 36.0–46.0)
HCT: 29.3 % — ABNORMAL LOW (ref 36.0–46.0)
Hemoglobin: 9.2 g/dL — ABNORMAL LOW (ref 12.0–15.0)
Hemoglobin: 9.2 g/dL — ABNORMAL LOW (ref 12.0–15.0)
MCH: 23 pg — ABNORMAL LOW (ref 26.0–34.0)
MCH: 23.8 pg — ABNORMAL LOW (ref 26.0–34.0)
MCHC: 30.2 g/dL (ref 30.0–36.0)
MCHC: 31.4 g/dL (ref 30.0–36.0)
MCV: 76.3 fL — ABNORMAL LOW (ref 80.0–100.0)
MCV: 75.9 fL — ABNORMAL LOW (ref 80.0–100.0)
Platelets: 495 10*3/uL — ABNORMAL HIGH (ref 150–400)
Platelets: 480 10*3/uL — ABNORMAL HIGH (ref 150–400)
RBC: 4 MIL/uL (ref 3.87–5.11)
RBC: 3.86 MIL/uL — ABNORMAL LOW (ref 3.87–5.11)
RDW: 18.8 % — ABNORMAL HIGH (ref 11.5–15.5)
RDW: 18.9 % — ABNORMAL HIGH (ref 11.5–15.5)
WBC: 11.2 10*3/uL — ABNORMAL HIGH (ref 4.0–10.5)
WBC: 9.9 10*3/uL (ref 4.0–10.5)
nRBC: 0 % (ref 0.0–0.2)
nRBC: 0 % (ref 0.0–0.2)

## 2021-08-29 LAB — LIPASE, BLOOD: Lipase: 40 U/L (ref 11–51)

## 2021-08-29 LAB — BILIRUBIN, DIRECT
Bilirubin, Direct: <0.1 mg/dL (ref 0.0–0.2)
Bilirubin, Direct: <0.1 mg/dL (ref 0.0–0.2)

## 2021-08-29 LAB — TRIGLYCERIDES: Triglycerides: 102 mg/dL (ref <150)

## 2021-08-29 LAB — AMYLASE: Amylase: 41 U/L (ref 28–100)

## 2021-08-29 LAB — PROTIME-INR
INR: 0.9 (ref 0.8–1.2)
INR: 0.9 (ref 0.8–1.2)
Prothrombin Time: 12.2 seconds (ref 11.4–15.2)
Prothrombin Time: 12.4 seconds (ref 11.4–15.2)

## 2021-08-29 LAB — APTT
aPTT: 28 seconds (ref 24–36)
aPTT: 30 seconds (ref 24–36)

## 2021-08-29 LAB — CK TOTAL AND CKMB (NOT AT ARMC)
CK, MB: 1.5 ng/mL (ref 0.5–5.0)
Relative Index: INVALID (ref 0.0–2.5)
Total CK: 52 U/L (ref 38–234)

## 2021-08-29 LAB — TROPONIN I (HIGH SENSITIVITY): Troponin I (High Sensitivity): 5 ng/L (ref <18)

## 2021-08-29 NOTE — Progress Notes (Signed)
PHARMACY - PHYSICIAN COMMUNICATION CRITICAL VALUE ALERT - BLOOD CULTURE IDENTIFICATION (BCID)  Vicki Moore is an 41 y.o. female who presented to [facility] on 09-08-2021 with a chief complaint of cardiac arrest.  Assessment:  One of 2 blood cultures + for GPC - BCID identified as staph epi.   Name of physician (or Provider) ContactedCeline Mans, MD  Current antibiotics: none  Changes to prescribed antibiotics recommended:  Likely contaminant, no action needed.  Results for orders placed or performed during the hospital encounter of 08-Sep-2021  Blood Culture ID Panel (Reflexed) (Collected: 08/28/2021  7:07 PM)  Result Value Ref Range   Enterococcus faecalis NOT DETECTED NOT DETECTED   Enterococcus Faecium NOT DETECTED NOT DETECTED   Listeria monocytogenes NOT DETECTED NOT DETECTED   Staphylococcus species DETECTED (A) NOT DETECTED   Staphylococcus aureus (BCID) NOT DETECTED NOT DETECTED   Staphylococcus epidermidis DETECTED (A) NOT DETECTED   Staphylococcus lugdunensis NOT DETECTED NOT DETECTED   Streptococcus species NOT DETECTED NOT DETECTED   Streptococcus agalactiae NOT DETECTED NOT DETECTED   Streptococcus pneumoniae NOT DETECTED NOT DETECTED   Streptococcus pyogenes NOT DETECTED NOT DETECTED   A.calcoaceticus-baumannii NOT DETECTED NOT DETECTED   Bacteroides fragilis NOT DETECTED NOT DETECTED   Enterobacterales NOT DETECTED NOT DETECTED   Enterobacter cloacae complex NOT DETECTED NOT DETECTED   Escherichia coli NOT DETECTED NOT DETECTED   Klebsiella aerogenes NOT DETECTED NOT DETECTED   Klebsiella oxytoca NOT DETECTED NOT DETECTED   Klebsiella pneumoniae NOT DETECTED NOT DETECTED   Proteus species NOT DETECTED NOT DETECTED   Salmonella species NOT DETECTED NOT DETECTED   Serratia marcescens NOT DETECTED NOT DETECTED   Haemophilus influenzae NOT DETECTED NOT DETECTED   Neisseria meningitidis NOT DETECTED NOT DETECTED   Pseudomonas aeruginosa NOT DETECTED NOT DETECTED    Stenotrophomonas maltophilia NOT DETECTED NOT DETECTED   Candida albicans NOT DETECTED NOT DETECTED   Candida auris NOT DETECTED NOT DETECTED   Candida glabrata NOT DETECTED NOT DETECTED   Candida krusei NOT DETECTED NOT DETECTED   Candida parapsilosis NOT DETECTED NOT DETECTED   Candida tropicalis NOT DETECTED NOT DETECTED   Cryptococcus neoformans/gattii NOT DETECTED NOT DETECTED   Methicillin resistance mecA/C DETECTED (A) NOT DETECTED    Tad Moore, BCPS, BCCP Clinical Pharmacist  08/29/2021 5:11 PM   [facility] pharmacy phone numbers are listed on [email or web address]

## 2021-08-29 NOTE — Progress Notes (Signed)
NAME:  Vicki Moore, MRN:  [id number], DOB:  12/09/1980, LOS: 42 ADMISSION DATE:  08/15/2021, CONSULTATION DATE:  09/02/2021 REFERRING MD:  Dr. Melina Copa, CHIEF COMPLAINT:  Cardiac arrest    History of Present Illness:  Vicki Moore is a 41 y.o. with a PMH WPW, Depression, anxiety, asthma and polysubstance abuse who presented to the ED via EMS after being found unresponsive by child. Per report patient was with 10yo son when she went to the bathroom and child heard a loud thud and patient was found unresponsive eon floor. On EMS arrival patient was found in PEA arrest, ACLS protocol ran and king airway placed. Unknown if patient received bystandard CPR. 67mn time between call and EMS arrival.   On ED arrival patient was seen minimally responsive (non-purposeful movement to upper extremities) with mild hypothermia and mild tachycardia. Lab work significant for K 3.0, CO2 12, Alkaline phos 172, AST 390, ALT 228, lactic 7.1, and hgb 10.5. CXR with left lung base opacities concerning for aspiration. PCCM consulted for further management and admission.   Pertinent  Medical History  WPW, Depression, anxiety, asthma and polysubstance abuse   Significant Hospital Events: Including procedures, antibiotic start and stop dates in addition to other pertinent events   5/6 admitted after out of hospital cardiac arrest  5/9 MRI c/w diffuse anoxic injury 5/11 Heavy sedation 5/15 met with family who is asking for more time.   Interim History / Subjective:  No overnight issues. [facility] has taken over donor work up.   Objective   Blood pressure 110/67, pulse 99, temperature 99.7 F (37.6 C), temperature source Bladder, resp. rate (!) 22, height _0  (1.6 m), weight 79.8 kg, SpO2 100 %. CVP:  [6 mmHg] 6 mmHg  Vent Mode: PRVC FiO2 (%):  [40 %] 40 % Set Rate:  [16 bmp] 16 bmp Vt Set:  [410 mL] 410 mL PEEP:  [5 cmH20] 5 cmH20 Plateau Pressure:  [16 cmH20-19 cmH20] 19 cmH20   Intake/Output  Summary (Last 24 hours) at 08/29/2021 1024 Last data filed at 08/29/2021 1000 Gross per 24 hour  Intake 2864.71 ml  Output 2070 ml  Net 794.71 ml   Filed Weights   08/27/21 0500 08/28/21 0500 08/29/21 0500  Weight: 83.6 kg 80.6 kg 79.8 kg    Examination: Intubated, deeply sedated Not responsive   Resolved Hospital Problem list   Acute liver injury post arrest  Assessment & Plan:   Prolonged OOH PEA arrest Anoxic encephalopathy - with refractory myoclonuse requiring propofol, versed and fentanyl to suppress.  - VEEG neg and with MRI findings consistent with anoxic encephalopathy - s/p TTM  Circulatory Shock - likely secondary to drugs from sedation  Acute hypoxemic respiratory failure, aspiration pneumonia History of polysubstance abuse -PRVC 8cc/kg as rest mode -VAP prevention measures  Moderate Protein Calorie Malnutrition Anemia   Goals of Care See below.    Best Practice (right click and "Reselect all SmartList Selections" daily)  Diet/type: TF DVT prophylaxis: prophylactic heparin  GI prophylaxis: PPI Lines: N/A Foley:  Yes, and it is still needed Code Status:  DNR Last date of multidisciplinary goals of care discussion: 5/18 discussed with family at bedside they would like to proceed with transitions of care to comfort measures and have opted for organ donation. Work up in progress.   Critical Care Time: 32 minutes   The patient is critically ill due to respiratory failure.  Critical care was necessary to treat or prevent imminent or life-threatening deterioration.  Critical care was time spent personally by me on the following activities: development of treatment plan with patient and/or surrogate as well as nursing, discussions with consultants, evaluation of patient's response to treatment, examination of patient, obtaining history from patient or surrogate, ordering and performing treatments and interventions, ordering and review of laboratory studies,  ordering and review of radiographic studies, pulse oximetry, re-evaluation of patient's condition and participation in multidisciplinary rounds.   Critical Care Time devoted to patient care services described in this note is 32 minutes. This time reflects time of care of this [place] . This critical care time does not reflect separately billable procedures or procedure time, teaching time or supervisory time of PA/NP/Med student/Med Resident etc but could involve care discussion time.       Spero Geralds [place] [facility] 08/29/2021 10:24 AM  Pager: see AMION  If no response to pager , please call critical care on call (see AMION) until 7pm After 7:00 pm call Elink

## 2021-08-29 NOTE — Procedures (Signed)
Arterial Catheter Insertion Procedure Note  Vicki Moore  [id number]  09-17-1980  Date:08/29/21  Time:5:27 PM    Provider Performing: Dewain Penning T    Procedure: Insertion of Arterial Line (22025) without US guidance  Indication(s) Blood pressure monitoring and/or need for frequent ABGs  Consent Risks of the procedure as well as the alternatives and risks of each were explained to the patient and/or caregiver.  Consent for the procedure was obtained and is signed in the bedside chart  Anesthesia None   Time Out Verified patient identification, verified procedure, site/side was marked, verified correct patient position, special equipment/implants available, medications/allergies/relevant history reviewed, required imaging and test results available.   Sterile Technique Maximal sterile technique including full sterile barrier drape, hand hygiene, sterile gown, sterile gloves, mask, hair covering, sterile ultrasound probe cover (if used).   Procedure Description Area of catheter insertion was cleaned with chlorhexidine and draped in sterile fashion. Without real-time ultrasound guidance an arterial catheter was placed into the left radial artery.  Appropriate arterial tracings confirmed on monitor.     Complications/Tolerance None; patient tolerated the procedure well.   EBL Minimal   Specimen(s) None

## 2021-08-30 ENCOUNTER — Encounter (HOSPITAL_COMMUNITY): Payer: Self-pay | Admitting: Anesthesiology

## 2021-08-30 LAB — LIPASE, BLOOD
Lipase: 42 U/L (ref 11–51)
Lipase: 48 U/L (ref 11–51)

## 2021-08-30 LAB — CBC
HCT: 30 % — ABNORMAL LOW (ref 36.0–46.0)
HCT: 28.3 % — ABNORMAL LOW (ref 36.0–46.0)
Hemoglobin: 9.1 g/dL — ABNORMAL LOW (ref 12.0–15.0)
Hemoglobin: 8.4 g/dL — ABNORMAL LOW (ref 12.0–15.0)
MCH: 23.2 pg — ABNORMAL LOW (ref 26.0–34.0)
MCH: 23.1 pg — ABNORMAL LOW (ref 26.0–34.0)
MCHC: 30.3 g/dL (ref 30.0–36.0)
MCHC: 29.7 g/dL — ABNORMAL LOW (ref 30.0–36.0)
MCV: 76.3 fL — ABNORMAL LOW (ref 80.0–100.0)
MCV: 78 fL — ABNORMAL LOW (ref 80.0–100.0)
Platelets: 476 10*3/uL — ABNORMAL HIGH (ref 150–400)
Platelets: 474 10*3/uL — ABNORMAL HIGH (ref 150–400)
RBC: 3.93 MIL/uL (ref 3.87–5.11)
RBC: 3.63 MIL/uL — ABNORMAL LOW (ref 3.87–5.11)
RDW: 18.8 % — ABNORMAL HIGH (ref 11.5–15.5)
RDW: 18.6 % — ABNORMAL HIGH (ref 11.5–15.5)
WBC: 9.2 10*3/uL (ref 4.0–10.5)
WBC: 7.3 10*3/uL (ref 4.0–10.5)
nRBC: 0 % (ref 0.0–0.2)
nRBC: 0 % (ref 0.0–0.2)

## 2021-08-30 LAB — APTT
aPTT: 30 seconds (ref 24–36)
aPTT: 31 seconds (ref 24–36)

## 2021-08-30 LAB — COMPREHENSIVE METABOLIC PANEL
ALT: 17 U/L (ref 0–44)
ALT: 14 U/L (ref 0–44)
AST: 27 U/L (ref 15–41)
AST: 26 U/L (ref 15–41)
Albumin: 2.8 g/dL — ABNORMAL LOW (ref 3.5–5.0)
Albumin: 2.5 g/dL — ABNORMAL LOW (ref 3.5–5.0)
Alkaline Phosphatase: 122 U/L (ref 38–126)
Alkaline Phosphatase: 123 U/L (ref 38–126)
Anion gap: 12 (ref 5–15)
Anion gap: 10 (ref 5–15)
BUN: 16 mg/dL (ref 6–20)
BUN: 15 mg/dL (ref 6–20)
CO2: 28 mmol/L (ref 22–32)
CO2: 28 mmol/L (ref 22–32)
Calcium: 9.8 mg/dL (ref 8.9–10.3)
Calcium: 9.4 mg/dL (ref 8.9–10.3)
Chloride: 98 mmol/L (ref 98–111)
Chloride: 97 mmol/L — ABNORMAL LOW (ref 98–111)
Creatinine, Ser: 0.39 mg/dL — ABNORMAL LOW (ref 0.44–1.00)
Creatinine, Ser: 0.44 mg/dL (ref 0.44–1.00)
GFR, Estimated: >60 mL/min (ref >60)
GFR, Estimated: >60 mL/min (ref >60)
Glucose, Bld: 89 mg/dL (ref 70–99)
Glucose, Bld: 96 mg/dL (ref 70–99)
Potassium: 3.7 mmol/L (ref 3.5–5.1)
Potassium: 3.7 mmol/L (ref 3.5–5.1)
Sodium: 138 mmol/L (ref 135–145)
Sodium: 135 mmol/L (ref 135–145)
Total Bilirubin: 0.3 mg/dL (ref 0.3–1.2)
Total Bilirubin: 0.5 mg/dL (ref 0.3–1.2)
Total Protein: 7.4 g/dL (ref 6.5–8.1)
Total Protein: 6.7 g/dL (ref 6.5–8.1)

## 2021-08-30 LAB — CK TOTAL AND CKMB (NOT AT ARMC)
CK, MB: 1.2 ng/mL (ref 0.5–5.0)
CK, MB: 0.7 ng/mL (ref 0.5–5.0)
Relative Index: INVALID (ref 0.0–2.5)
Relative Index: INVALID (ref 0.0–2.5)
Total CK: 35 U/L — ABNORMAL LOW (ref 38–234)
Total CK: 29 U/L — ABNORMAL LOW (ref 38–234)

## 2021-08-30 LAB — TROPONIN I (HIGH SENSITIVITY)
Troponin I (High Sensitivity): 3 ng/L (ref <18)
Troponin I (High Sensitivity): 2 ng/L (ref <18)

## 2021-08-30 LAB — SARS CORONAVIRUS 2 BY RT PCR: SARS Coronavirus 2 by RT PCR: NEGATIVE

## 2021-08-30 LAB — GLUCOSE, CAPILLARY
Glucose-Capillary: 104 mg/dL — ABNORMAL HIGH (ref 70–99)
Glucose-Capillary: 112 mg/dL — ABNORMAL HIGH (ref 70–99)
Glucose-Capillary: 67 mg/dL — ABNORMAL LOW (ref 70–99)
Glucose-Capillary: 71 mg/dL (ref 70–99)
Glucose-Capillary: 76 mg/dL (ref 70–99)
Glucose-Capillary: 78 mg/dL (ref 70–99)
Glucose-Capillary: 80 mg/dL (ref 70–99)
Glucose-Capillary: 81 mg/dL (ref 70–99)

## 2021-08-30 LAB — URINALYSIS, ROUTINE W REFLEX MICROSCOPIC
Bilirubin Urine: NEGATIVE
Glucose, UA: NEGATIVE mg/dL
Hgb urine dipstick: NEGATIVE
Ketones, ur: NEGATIVE mg/dL
Nitrite: NEGATIVE
Protein, ur: NEGATIVE mg/dL
Specific Gravity, Urine: 1.023 (ref 1.005–1.030)
pH: 5 (ref 5.0–8.0)

## 2021-08-30 LAB — PROTIME-INR
INR: 1 (ref 0.8–1.2)
INR: 1 (ref 0.8–1.2)
Prothrombin Time: 12.9 seconds (ref 11.4–15.2)
Prothrombin Time: 13.4 seconds (ref 11.4–15.2)

## 2021-08-30 LAB — AMYLASE
Amylase: 47 U/L (ref 28–100)
Amylase: 49 U/L (ref 28–100)

## 2021-08-30 LAB — BILIRUBIN, DIRECT
Bilirubin, Direct: <0.1 mg/dL (ref 0.0–0.2)
Bilirubin, Direct: <0.1 mg/dL (ref 0.0–0.2)

## 2021-08-30 LAB — TRIGLYCERIDES: Triglycerides: 824 mg/dL — ABNORMAL HIGH (ref <150)

## 2021-08-30 MED ORDER — VANCOMYCIN HCL IN DEXTROSE 1-5 GM/200ML-% IV SOLN
1000.0000 mg | Freq: Three times a day (TID) | INTRAVENOUS | Status: DC
  Administered 2021-08-30 – 2021-08-31 (×3): 1000 mg via INTRAVENOUS
  Filled 2021-08-30 (×3): qty 200

## 2021-08-30 MED ORDER — SODIUM CHLORIDE 0.9 % IV SOLN: INTRAVENOUS | Status: DC

## 2021-08-30 MED ORDER — DEXTROSE 50 % IV SOLN
12.5000 g | INTRAVENOUS | Status: AC
  Administered 2021-08-30: 12.5 g via INTRAVENOUS
  Filled 2021-08-30: qty 50

## 2021-08-30 MED ORDER — PANTOPRAZOLE SODIUM 40 MG IV SOLR
40.0000 mg | INTRAVENOUS | Status: DC
  Administered 2021-08-30 – 2021-08-31 (×2): 40 mg via INTRAVENOUS
  Filled 2021-08-30 (×2): qty 10

## 2021-08-30 NOTE — Progress Notes (Signed)
NAME:  Vicki Moore, MRN:  [id number], DOB:  1980/05/01, LOS: 13 ADMISSION DATE:  08/30/2021, CONSULTATION DATE:  09/04/2021 REFERRING MD:  Dr. Melina Copa, CHIEF COMPLAINT:  Cardiac arrest    History of Present Illness:  Vicki Moore is a 41 y.o. with a PMH WPW, Depression, anxiety, asthma and polysubstance abuse who presented to the ED via EMS after being found unresponsive by child. Per report patient was with 10yo son when she went to the bathroom and child heard a loud thud and patient was found unresponsive eon floor. On EMS arrival patient was found in PEA arrest, ACLS protocol ran and king airway placed. Unknown if patient received bystandard CPR. 62mn time between call and EMS arrival.   On ED arrival patient was seen minimally responsive (non-purposeful movement to upper extremities) with mild hypothermia and mild tachycardia. Lab work significant for K 3.0, CO2 12, Alkaline phos 172, AST 390, ALT 228, lactic 7.1, and hgb 10.5. CXR with left lung base opacities concerning for aspiration. PCCM consulted for further management and admission.   Pertinent  Medical History  WPW, Depression, anxiety, asthma and polysubstance abuse   Significant Hospital Events: Including procedures, antibiotic start and stop dates in addition to other pertinent events   5/6 admitted after out of hospital cardiac arrest  5/9 MRI c/w diffuse anoxic injury 5/11 Heavy sedation 5/15 met with family who is asking for more time.   Interim History / Subjective:  No overnight issues. [facility] work up in progress.   Objective   Blood pressure (!) 100/57, pulse 88, temperature 99.3 F (37.4 C), resp. rate 17, height _0  (1.6 m), weight 80.3 kg, SpO2 96 %. CVP:  [3 mmHg-18 mmHg] 3 mmHg  Vent Mode: PRVC FiO2 (%):  [40 %] 40 % Set Rate:  [16 bmp] 16 bmp Vt Set:  [410 mL] 410 mL PEEP:  [5 cmH20] 5 cmH20 Plateau Pressure:  [17 cmH20-21 cmH20] 17 cmH20   Intake/Output Summary (Last 24 hours) at  08/30/2021 0919 Last data filed at 08/30/2021 [id number] per 24 hour  Intake 1493.77 ml  Output 2200 ml  Net -706.23 ml   Filed Weights   08/28/21 0500 08/29/21 0500 08/30/21 0500  Weight: 80.6 kg 79.8 kg 80.3 kg    Examination: Intubated, deeply sedated Not responsive Myoclonus with stimulation   Resolved Hospital Problem list   Acute liver injury post arrest  Assessment & Plan:   Prolonged OOH PEA arrest Anoxic encephalopathy - with refractory myoclonuse requiring propofol, versed and fentanyl to suppress.  - VEEG neg and with MRI findings consistent with anoxic encephalopathy - s/p TTM - [facility] managing for organ donation  Circulatory Shock - likely secondary to drugs from sedation  Acute hypoxemic respiratory failure, aspiration pneumonia History of polysubstance abuse -PRVC 8cc/kg as rest mode -VAP prevention measures  Moderate Protein Calorie Malnutrition Anemia   Goals of Care See below.    Best Practice (right click and "Reselect all SmartList Selections" daily)  Diet/type: TF DVT prophylaxis: prophylactic heparin  GI prophylaxis: PPI Lines: N/A Foley:  Yes, and it is still needed Code Status:  DNR Last date of multidisciplinary goals of care discussion: 5/18 discussed with family at bedside they would like to proceed with transitions of care to comfort measures and have opted for organ donation. Work up in progress.   Critical Care Time: 31 minutes   The patient is critically ill due to anoxic encephalopathy, respiratory failure.  Critical care was necessary to  treat or prevent imminent or life-threatening deterioration.  Critical care was time spent personally by me on the following activities: development of treatment plan with patient and/or surrogate as well as nursing, discussions with consultants, evaluation of patient's response to treatment, examination of patient, obtaining history from patient or surrogate, ordering and performing  treatments and interventions, ordering and review of laboratory studies, ordering and review of radiographic studies, pulse oximetry, re-evaluation of patient's condition and participation in multidisciplinary rounds.   Critical Care Time devoted to patient care services described in this note is 31 minutes. This time reflects time of care of this Payne . This critical care time does not reflect separately billable procedures or procedure time, teaching time or supervisory time of PA/NP/Med student/Med Resident etc but could involve care discussion time.       Spero Geralds [facility] 08/30/2021 9:20 AM  Pager: see AMION  If no response to pager , please call critical care on call (see AMION) until 7pm After 7:00 pm call Elink

## 2021-08-31 ENCOUNTER — Encounter (HOSPITAL_COMMUNITY): Admission: EM | Disposition: E | Payer: Self-pay | Source: Home / Self Care | Attending: Internal Medicine

## 2021-08-31 ENCOUNTER — Other Ambulatory Visit: Payer: Self-pay

## 2021-08-31 DIAGNOSIS — G931 Anoxic brain damage, not elsewhere classified: Secondary | ICD-10-CM

## 2021-08-31 DIAGNOSIS — R092 Respiratory arrest: Secondary | ICD-10-CM

## 2021-08-31 LAB — CULTURE, BLOOD (ROUTINE X 2)

## 2021-08-31 LAB — APTT: aPTT: 31 seconds (ref 24–36)

## 2021-08-31 LAB — GLUCOSE, CAPILLARY
Glucose-Capillary: 80 mg/dL (ref 70–99)
Glucose-Capillary: 111 mg/dL — ABNORMAL HIGH (ref 70–99)
Glucose-Capillary: 82 mg/dL (ref 70–99)

## 2021-08-31 LAB — CBC
HCT: 29.2 % — ABNORMAL LOW (ref 36.0–46.0)
Hemoglobin: 8.8 g/dL — ABNORMAL LOW (ref 12.0–15.0)
MCH: 23.2 pg — ABNORMAL LOW (ref 26.0–34.0)
MCHC: 30.1 g/dL (ref 30.0–36.0)
MCV: 77 fL — ABNORMAL LOW (ref 80.0–100.0)
Platelets: 474 10*3/uL — ABNORMAL HIGH (ref 150–400)
RBC: 3.79 MIL/uL — ABNORMAL LOW (ref 3.87–5.11)
RDW: 18.5 % — ABNORMAL HIGH (ref 11.5–15.5)
WBC: 8.8 10*3/uL (ref 4.0–10.5)
nRBC: 0 % (ref 0.0–0.2)

## 2021-08-31 LAB — URINALYSIS, ROUTINE W REFLEX MICROSCOPIC
Bilirubin Urine: NEGATIVE
Glucose, UA: NEGATIVE mg/dL
Hgb urine dipstick: NEGATIVE
Ketones, ur: NEGATIVE mg/dL
Nitrite: NEGATIVE
Protein, ur: NEGATIVE mg/dL
Specific Gravity, Urine: 1.024 (ref 1.005–1.030)
pH: 5 (ref 5.0–8.0)

## 2021-08-31 LAB — CK TOTAL AND CKMB (NOT AT ARMC)
CK, MB: 0.8 ng/mL (ref 0.5–5.0)
Relative Index: INVALID (ref 0.0–2.5)
Total CK: 29 U/L — ABNORMAL LOW (ref 38–234)

## 2021-08-31 LAB — COMPREHENSIVE METABOLIC PANEL
ALT: 15 U/L (ref 0–44)
AST: 27 U/L (ref 15–41)
Albumin: 2.6 g/dL — ABNORMAL LOW (ref 3.5–5.0)
Alkaline Phosphatase: 137 U/L — ABNORMAL HIGH (ref 38–126)
Anion gap: 8 (ref 5–15)
BUN: 13 mg/dL (ref 6–20)
CO2: 28 mmol/L (ref 22–32)
Calcium: 9.4 mg/dL (ref 8.9–10.3)
Chloride: 102 mmol/L (ref 98–111)
Creatinine, Ser: 0.41 mg/dL — ABNORMAL LOW (ref 0.44–1.00)
GFR, Estimated: >60 mL/min (ref >60)
Glucose, Bld: 91 mg/dL (ref 70–99)
Potassium: 3.6 mmol/L (ref 3.5–5.1)
Sodium: 138 mmol/L (ref 135–145)
Total Bilirubin: 0.4 mg/dL (ref 0.3–1.2)
Total Protein: 7.1 g/dL (ref 6.5–8.1)

## 2021-08-31 LAB — URINE CULTURE: Culture: >=100000 — AB

## 2021-08-31 LAB — TRIGLYCERIDES: Triglycerides: 153 mg/dL — ABNORMAL HIGH (ref <150)

## 2021-08-31 LAB — TROPONIN I (HIGH SENSITIVITY): Troponin I (High Sensitivity): <2 ng/L (ref <18)

## 2021-08-31 LAB — PROTIME-INR
INR: 1 (ref 0.8–1.2)
Prothrombin Time: 13.1 seconds (ref 11.4–15.2)

## 2021-08-31 LAB — AMYLASE: Amylase: 56 U/L (ref 28–100)

## 2021-08-31 LAB — BILIRUBIN, DIRECT: Bilirubin, Direct: <0.1 mg/dL (ref 0.0–0.2)

## 2021-08-31 LAB — MAGNESIUM: Magnesium: 1.7 mg/dL (ref 1.7–2.4)

## 2021-08-31 LAB — LIPASE, BLOOD: Lipase: 48 U/L (ref 11–51)

## 2021-08-31 SURGERY — CANCELLED PROCEDURE
Anesthesia: General

## 2021-08-31 MED ORDER — POTASSIUM CHLORIDE 10 MEQ/50ML IV SOLN
10.0000 meq | INTRAVENOUS | Status: AC
  Administered 2021-08-31 (×4): 10 meq via INTRAVENOUS
  Filled 2021-08-31 (×4): qty 50

## 2021-08-31 MED ORDER — POTASSIUM CHLORIDE 20 MEQ PO PACK: 40.0000 meq | PACK | Freq: Once | ORAL | Status: DC

## 2021-08-31 MED ORDER — ACETAMINOPHEN 325 MG PO TABS
650.0000 mg | ORAL_TABLET | Freq: Four times a day (QID) | ORAL | Status: DC | PRN
  Filled 2021-08-31: qty 2

## 2021-08-31 MED ORDER — LORAZEPAM 2 MG/ML IJ SOLN
INTRAMUSCULAR | Status: AC
  Administered 2021-08-31: 2 mg via INTRAVENOUS
  Filled 2021-08-31: qty 1

## 2021-08-31 MED ORDER — DIPHENHYDRAMINE HCL 50 MG/ML IJ SOLN
25.0000 mg | INTRAMUSCULAR | Status: DC | PRN
  Filled 2021-08-31: qty 0.5

## 2021-08-31 MED ORDER — ACETAMINOPHEN 650 MG RE SUPP
650.0000 mg | Freq: Four times a day (QID) | RECTAL | Status: DC | PRN
  Filled 2021-08-31: qty 1

## 2021-08-31 MED ORDER — HEPARIN SODIUM 10000 UNIT/ML FOR ORGAN DONATION
300.0000 [IU]/kg | INTRAMUSCULAR | Status: DC
  Filled 2021-08-31: qty 3

## 2021-08-31 MED ORDER — POLYVINYL ALCOHOL 1.4 % OP SOLN: 1.0000 [drp] | Freq: Four times a day (QID) | OPHTHALMIC | Status: DC | PRN

## 2021-08-31 MED ORDER — GLYCOPYRROLATE 0.2 MG/ML IJ SOLN
INTRAMUSCULAR | Status: AC
  Administered 2021-08-31: 0.2 mg via INTRAVENOUS
  Filled 2021-08-31: qty 1

## 2021-08-31 MED ORDER — GLYCOPYRROLATE 1 MG PO TABS: 1.0000 mg | ORAL_TABLET | ORAL | Status: DC | PRN

## 2021-08-31 MED ORDER — 0.9 % SODIUM CHLORIDE (POUR BTL) OPTIME
TOPICAL | Status: DC | PRN
  Administered 2021-08-31: 8000 mL

## 2021-08-31 MED ORDER — GLYCOPYRROLATE 0.2 MG/ML IJ SOLN
0.2000 mg | INTRAMUSCULAR | Status: DC | PRN
  Filled 2021-08-31: qty 1

## 2021-08-31 MED ORDER — DEXTROSE 5 % IV SOLN: INTRAVENOUS | Status: DC

## 2021-08-31 MED ORDER — GLYCOPYRROLATE 0.2 MG/ML IJ SOLN
0.2000 mg | INTRAMUSCULAR | Status: DC | PRN
Start: 2021-08-31 — End: 2021-09-01

## 2021-08-31 MED ORDER — MORPHINE 100MG IN NS 100ML (1MG/ML) PREMIX INFUSION
INTRAVENOUS | Status: AC
  Administered 2021-08-31: 20 mg/h via INTRAVENOUS
  Filled 2021-08-31: qty 100

## 2021-08-31 MED ORDER — LORAZEPAM 2 MG/ML IJ SOLN
0.5000 mg/h | INTRAVENOUS | Status: DC
  Administered 2021-08-31: 5 mg/h via INTRAVENOUS
  Filled 2021-08-31 (×2): qty 25

## 2021-08-31 MED ORDER — MAGNESIUM SULFATE 4 GM/100ML IV SOLN
4.0000 g | Freq: Once | INTRAVENOUS | Status: AC
  Administered 2021-08-31: 4 g via INTRAVENOUS
  Filled 2021-08-31: qty 100

## 2021-08-31 MED ORDER — MORPHINE 100MG IN NS 100ML (1MG/ML) PREMIX INFUSION
1.0000 mg/h | INTRAVENOUS | Status: DC
  Administered 2021-08-31 (×2): 80 mg/h via INTRAVENOUS
  Filled 2021-08-31 (×3): qty 100

## 2021-08-31 MED ORDER — LORAZEPAM 2 MG/ML IJ SOLN
2.0000 mg | INTRAMUSCULAR | Status: DC | PRN
  Administered 2021-08-31: 4 mg via INTRAVENOUS
  Filled 2021-08-31: qty 2

## 2021-08-31 SURGICAL SUPPLY — 89 items
APPLIER CLIP 11 MED OPEN (CLIP)
APR CLP MED 11 20 MLT OPN (CLIP)
BAG COUNTER SPONGE SURGICOUNT (BAG) ×1 IMPLANT
BAG SPNG CNTER NS LX DISP (BAG)
BLADE CLIPPER SURG (BLADE) IMPLANT
BLADE SAW STERNAL (BLADE) ×1 IMPLANT
BLADE SURG 10 STRL SS (BLADE) IMPLANT
CLIP APPLIE 11 MED OPEN (CLIP) ×1 IMPLANT
CLIP VESOCCLUDE MED 24/CT (CLIP) IMPLANT
CLIP VESOCCLUDE SM WIDE 24/CT (CLIP) IMPLANT
CNTNR URN SCR LID CUP LEK RST (MISCELLANEOUS) ×1 IMPLANT
CONT SPEC 4OZ STRL OR WHT (MISCELLANEOUS)
COVER BACK TABLE 60X90IN (DRAPES) IMPLANT
COVER MAYO STAND STRL (DRAPES) IMPLANT
COVER SURGICAL LIGHT HANDLE (MISCELLANEOUS) ×1 IMPLANT
DRAPE HALF SHEET 40X57 (DRAPES) IMPLANT
DRAPE SLUSH MACHINE 52X66 (DRAPES) ×1 IMPLANT
DRSG COVADERM 4X10 (GAUZE/BANDAGES/DRESSINGS) IMPLANT
DRSG TELFA 3X8 NADH (GAUZE/BANDAGES/DRESSINGS) IMPLANT
DURAPREP 26ML APPLICATOR (WOUND CARE) ×4 IMPLANT
ELECT BLADE 6.5 EXT (BLADE) IMPLANT
ELECT REM PT RETURN 9FT ADLT (ELECTROSURGICAL)
ELECTRODE REM PT RTRN 9FT ADLT (ELECTROSURGICAL) ×2 IMPLANT
GAUZE 4X4 16PLY ~~LOC~~+RFID DBL (SPONGE) IMPLANT
GLOVE BIO SURGEON STRL SZ7 (GLOVE) IMPLANT
GLOVE BIO SURGEON STRL SZ7.5 (GLOVE) IMPLANT
GLOVE BIO SURGEON STRL SZ8 (GLOVE) IMPLANT
GLOVE BIO SURGEON STRL SZ8.5 (GLOVE) IMPLANT
GLOVE BIOGEL PI IND STRL 7.0 (GLOVE) IMPLANT
GLOVE BIOGEL PI IND STRL 7.5 (GLOVE) IMPLANT
GLOVE BIOGEL PI IND STRL 8 (GLOVE) IMPLANT
GLOVE BIOGEL PI IND STRL 8.5 (GLOVE) IMPLANT
GLOVE BIOGEL PI INDICATOR 7.0 (GLOVE)
GLOVE BIOGEL PI INDICATOR 7.5 (GLOVE)
GLOVE BIOGEL PI INDICATOR 8 (GLOVE)
GLOVE BIOGEL PI INDICATOR 8.5 (GLOVE)
GLOVE SURG SS PI 7.0 STRL IVOR (GLOVE) IMPLANT
GLOVE SURG SS PI 7.5 STRL IVOR (GLOVE) IMPLANT
GLOVE SURG SS PI 8.0 STRL IVOR (GLOVE) IMPLANT
GOWN STRL REUS W/ TWL LRG LVL3 (GOWN DISPOSABLE) ×4 IMPLANT
GOWN STRL REUS W/ TWL XL LVL3 (GOWN DISPOSABLE) ×2 IMPLANT
GOWN STRL REUS W/TWL LRG LVL3 (GOWN DISPOSABLE)
GOWN STRL REUS W/TWL XL LVL3 (GOWN DISPOSABLE)
HANDLE SUCTION POOLE (INSTRUMENTS) IMPLANT
KIT POST MORTEM ADULT 36X90 (BAG) ×1 IMPLANT
KIT TURNOVER KIT B (KITS) ×3 IMPLANT
LOOP VESSEL MAXI BLUE (MISCELLANEOUS) IMPLANT
LOOP VESSEL MINI RED (MISCELLANEOUS) IMPLANT
MANIFOLD NEPTUNE II (INSTRUMENTS) ×1 IMPLANT
NDL BIOPSY 14X6 SOFT TISS (NEEDLE) IMPLANT
NEEDLE BIOPSY 14X6 SOFT TISS (NEEDLE) IMPLANT
NS IRRIG 1000ML POUR BTL (IV SOLUTION) IMPLANT
PACK AORTA (CUSTOM PROCEDURE TRAY) ×1 IMPLANT
PAD ARMBOARD 7.5X6 YLW CONV (MISCELLANEOUS) ×2 IMPLANT
PAD DRESSING TELFA 3X8 NADH (GAUZE/BANDAGES/DRESSINGS) ×1 IMPLANT
PENCIL BUTTON HOLSTER BLD 10FT (ELECTRODE) ×1 IMPLANT
SOL PREP POV-IOD 4OZ 10% (MISCELLANEOUS) ×2 IMPLANT
SPONGE INTESTINAL PEANUT (DISPOSABLE) IMPLANT
SPONGE T-LAP 18X18 ~~LOC~~+RFID (SPONGE) IMPLANT
STAPLER VISISTAT 35W (STAPLE) ×1 IMPLANT
SUCTION POOLE HANDLE (INSTRUMENTS)
SUT BONE WAX W31G (SUTURE) IMPLANT
SUT ETHIBOND 5 LR DA (SUTURE) IMPLANT
SUT ETHILON 1 LR 30 (SUTURE) ×2 IMPLANT
SUT ETHILON 2 LR (SUTURE) IMPLANT
SUT PROLENE 3 0 RB 1 (SUTURE) IMPLANT
SUT PROLENE 3 0 SH 1 (SUTURE) IMPLANT
SUT PROLENE 4 0 RB 1 (SUTURE)
SUT PROLENE 4-0 RB1 .5 CRCL 36 (SUTURE) IMPLANT
SUT PROLENE 5 0 C 1 24 (SUTURE) IMPLANT
SUT PROLENE 6 0 BV (SUTURE) IMPLANT
SUT SILK 0 TIES 10X30 (SUTURE) IMPLANT
SUT SILK 1 SH (SUTURE) IMPLANT
SUT SILK 1 TIES 10X30 (SUTURE) IMPLANT
SUT SILK 2 0 (SUTURE)
SUT SILK 2 0 SH (SUTURE) IMPLANT
SUT SILK 2 0 SH CR/8 (SUTURE) IMPLANT
SUT SILK 2 0 TIES 10X30 (SUTURE) IMPLANT
SUT SILK 2-0 18XBRD TIE 12 (SUTURE) IMPLANT
SUT SILK 3 0 SH CR/8 (SUTURE) IMPLANT
SUT SILK 3 0 TIES 10X30 (SUTURE) IMPLANT
SWAB COLLECTION DEVICE MRSA (MISCELLANEOUS) IMPLANT
SWAB CULTURE ESWAB REG 1ML (MISCELLANEOUS) IMPLANT
SYR 50ML LL SCALE MARK (SYRINGE) IMPLANT
SYRINGE TOOMEY DISP (SYRINGE) IMPLANT
TAPE UMBILICAL 1/8 X36 TWILL (MISCELLANEOUS) IMPLANT
TUBE CONNECTING 12X1/4 (SUCTIONS) ×1 IMPLANT
WATER STERILE IRR 1000ML POUR (IV SOLUTION) IMPLANT
YANKAUER SUCT BULB TIP NO VENT (SUCTIONS) ×1 IMPLANT

## 2021-09-02 LAB — CULTURE, BLOOD (ROUTINE X 2): Culture: NO GROWTH

## 2021-09-02 MED FILL — Sodium Chloride IV Soln 0.9%: INTRAVENOUS | Qty: 250 | Status: AC

## 2021-09-02 MED FILL — Fentanyl Citrate Preservative Free (PF) Inj 2500 MCG/50ML: INTRAMUSCULAR | Qty: 50 | Status: AC

## 2021-09-11 NOTE — Progress Notes (Signed)
While rounding in family waiting area,Chaplain was reunited with pt's 40 year old son who said his mother had died. And there was an honor walk.  He took me to his mother's room where his family was.  Chaplain again offered [employer] [facility] with family at bedside.   Vernell Morgans Chaplain

## 2021-09-11 NOTE — Discharge Summary (Signed)
DEATH SUMMARY   Patient Details  Name: Vicki Moore MRN: [id number] DOB: [birth year]  Admission/Discharge Information   Admit Date:  2021/08/21  Date of Death: Date of Death: Sep 05, 2021  Time of Death: Time of Death: 07/25/2121  Length of Stay: 22  Referring Physician: Charlott Rakes, MD   Reason(s) for Hospitalization  Cardiac arrest  Diagnoses  Preliminary cause of death: cardiac arrest, unknown etiology. Suspected drug overdose.  Secondary Diagnoses (including complications and co-morbidities):  Principal Problem:   Cardiac arrest [facility]) Opioid dependence disorder   Brief Hospital Course (including significant findings, care, treatment, and services provided and events leading to death)  Vicki Moore was a 41 y.o. year old female with a PMH WPW, Depression, anxiety, asthma and polysubstance abuse who presented to the ED via EMS after being found unresponsive by child on 08-21-2021.  Per report patient was with 10yo son when she went to the bathroom and child heard a loud thud and patient was found unresponsive eon floor. On EMS arrival patient was found in PEA arrest, ACLS protocol ran and king airway placed. Unknown if patient received bystandard CPR. 40min time between call and EMS arrival.   On ED arrival patient was seen minimally responsive (non-purposeful movement to upper extremities) with mild hypothermia and mild tachycardia. Subsequent work up included an MRI which showed diffuse anoxic injury. She did receive target temperature management without improvement in her neurologic function. She had significant myoclonus requiring large amounts of sedation to suppress. The patient's family elected to transition her to comfort measures and hoped to donate her organs. [facility] was consulted and the patient's family proceeded with organ donation. Unfortunately she did not experience a cardiac death in the expected time frame and organ procurement did not occur. She was returned to  the ICU and comfort measures were escalated. She expired 07/25/2121 on Sep 05, 2021 with family at bedside.    Pertinent Labs and Studies  Significant Diagnostic Studies DG Abdomen 1 View  Result Date: Aug 21, 2021 CLINICAL DATA:  OG tube placement. EXAM: ABDOMEN - 1 VIEW COMPARISON:  CT 03/09/2021 FINDINGS: Tip and side port of the enteric tube below the diaphragm in the stomach. There is a generalized paucity of bowel gas in the abdomen. Cholecystectomy clips in the right upper quadrant. IMPRESSION: Tip and side port of the enteric tube below the diaphragm in the stomach. Electronically Signed   By: Keith Rake M.D.   On: 21-Aug-2021 17:04   CT Head Wo Contrast  Result Date: Aug 21, 2021 CLINICAL DATA:  41 year old female with altered mental status and unresponsive. EXAM: CT HEAD WITHOUT CONTRAST TECHNIQUE: Contiguous axial images were obtained from the base of the skull through the vertex without intravenous contrast. RADIATION DOSE REDUCTION: This exam was performed according to the departmental dose-optimization program which includes automated exposure control, adjustment of the mA and/or kV according to patient size and/or use of iterative reconstruction technique. COMPARISON:  04/23/2017 CT FINDINGS: Brain: No evidence of acute infarction, hemorrhage, hydrocephalus, extra-axial collection or mass lesion/mass effect. Vascular: No hyperdense vessel or unexpected calcification. Skull: Normal. Negative for fracture or focal lesion. Sinuses/Orbits: No acute finding. Mucosal thickening in scattered ethmoid air cells noted. Other: Oral intubation noted. IMPRESSION: No evidence of intracranial abnormality. Electronically Signed   By: Margarette Canada M.D.   On: 2021-08-21 20:14   CT CHEST WO CONTRAST  Result Date: Aug 21, 2021 CLINICAL DATA:  Adenopathy, abnormal x-ray. EXAM: CT CHEST WITHOUT CONTRAST TECHNIQUE: Multidetector CT imaging of the chest was performed following the  standard protocol without IV contrast.  RADIATION DOSE REDUCTION: This exam was performed according to the departmental dose-optimization program which includes automated exposure control, adjustment of the mA and/or kV according to patient size and/or use of iterative reconstruction technique. COMPARISON:  Chest x-ray 08/18/2021. CT angiogram chest 08/05/2020. FINDINGS: Cardiovascular: No significant vascular findings. Normal heart size. No pericardial effusion. Mediastinum/Nodes: No enlarged mediastinal or axillary lymph nodes. Thyroid gland, trachea, and esophagus demonstrate no significant findings. Enteric tube present. Lungs/Pleura: Endotracheal tube tip is just above the level of the carina projecting toward the right mainstem bronchus. There is new dense airspace opacity throughout the medial aspect of the right lower lobe. This abuts the hilum and there is soft tissue prominence in the region of the hilum. Air bronchograms are present. There is also new airspace opacity in the medial right upper lobe abutting the mediastinum. Patchy airspace disease in the left upper lobe and lingula also identified. There are atelectatic changes in the lung bases. There is no pleural effusion or pneumothorax. Upper Abdomen: No acute abnormality. Musculoskeletal: No fracture is seen. IMPRESSION: 1. Right upper lobe and left lower airspace consolidation seen medially worrisome for multifocal pneumonia. Additional patchy airspace disease in the left upper lobe. Note is made that this is an on usual distribution for airspace disease abutting the mediastinum. Other etiologies/underlying mass can not be excluded. 2. There is prominence of the right hilum which may represent adenopathy. Underlying focal mass would be difficult to exclude given lack of contrast and adjacent airspace disease. 3. Endotracheal tube tip is just above the level of the carina projecting toward the right mainstem bronchus. Consider repositioning. Electronically Signed   By: Ronney Asters M.D.    On: 09/09/2021 20:19   MR BRAIN WO CONTRAST  Result Date: 08/20/2021 CLINICAL DATA:  Initial evaluation for anoxic brain damage. EXAM: MRI HEAD WITHOUT CONTRAST TECHNIQUE: Multiplanar, multiecho pulse sequences of the brain and surrounding structures were obtained without intravenous contrast. COMPARISON:  Prior CT from 09/06/2021. FINDINGS: Brain: Cerebral volume within normal limits. Diffusely increased diffusion signal abnormality seen involving the cortical gray matter of both cerebral hemispheres. Involvement is fairly diffuse and symmetric in nature, with involvement most pronounced at the perirolandic regions. Symmetric diffusion signal abnormality seen involving the deep gray nuclei and cerebellum as well. Corresponding increased T2/FLAIR signal intensity within these regions. Findings consistent with diffuse anoxic brain injury. Associated no more than mild gyral swelling and edema. Cortical sulcation and basilar cisterns are preserved without midline shift or herniation. No hydrocephalus or trapping. No acute or subacute vascular infarct. No acute or chronic intracranial blood products. No mass lesion or extra-axial fluid collection. Pituitary gland and suprasellar region normal. Midline structures intact and normally formed. Vascular: Major intracranial vascular flow voids are maintained. Skull and upper cervical spine: Craniocervical junction normal. Bone marrow signal intensity diffusely decreased on T1 weighted sequence, nonspecific, but most commonly related to anemia, smoking or obesity. No scalp soft tissue abnormality. Sinuses/Orbits: Globes orbital soft tissues demonstrate no acute finding. Moderate mucosal thickening seen throughout the sphenoethmoidal sinuses. Fluid present within the nasopharynx. Patient is intubated. Trace bilateral mastoid effusions noted. Other: None. IMPRESSION: 1. Findings consistent with diffuse anoxic brain injury as detailed above. 2. No other acute intracranial  abnormality. Electronically Signed   By: Jeannine Boga M.D.   On: 08/20/2021 03:21   DG CHEST PORT 1 VIEW  Result Date: 08/28/2021 CLINICAL DATA:  Difficulty breathing endotracheal tube placement EXAM: PORTABLE CHEST 1 VIEW COMPARISON:  08/18/2021  FINDINGS: Transverse diameter of heart is within normal limits. Tip of endotracheal tube is 3.8 cm above the carina. Enteric tube is noted traversing the esophagus. Tip of right PICC line is seen in the region of superior vena cava close to the right atrium. Linear densities seen in the right parahilar region and left lower lung fields. There is minimal blunting of left lateral CP angle. There is interval decrease in amount of left pleural effusion. There is no pneumothorax. IMPRESSION: Linear densities in the lower lung fields may suggest subsegmental atelectasis. There is interval decrease in the amount of left pleural effusion. There are no signs of alveolar pulmonary edema or new focal infiltrates. Electronically Signed   By: Elmer Picker M.D.   On: 08/28/2021 11:55   DG Chest Port 1 View  Result Date: 08/18/2021 CLINICAL DATA:  ETT placement EXAM: PORTABLE CHEST 1 VIEW COMPARISON:  08/19/2021 FINDINGS: Endotracheal tube with the tip 4.2 above the carina. Nasogastric tube coursing below the diaphragm. Right-sided PICC line with the tip projecting over the SVC. Mild bibasilar atelectasis. Trace left pleural effusion. No pneumothorax. Stable cardiomediastinal silhouette. No acute osseous abnormality. IMPRESSION: 1. Endotracheal tube with the tip 4.2 above the carina. 2. Nasogastric tube coursing below the diaphragm. Electronically Signed   By: Kathreen Devoid M.D.   On: 08/18/2021 08:08   DG Chest Portable 1 View  Result Date: 09/08/2021 CLINICAL DATA:  Intubation.  OG-tube placement. EXAM: PORTABLE CHEST 1 VIEW COMPARISON:  Radiograph 04/08/2021 FINDINGS: Endotracheal tube tip 2.6 cm from the carina. Enteric tube in place with tip below the  diaphragm not included in the field of view. Patchy and confluent left lung base opacity. Lesser left perihilar opacity. Overall low lung volumes. No pulmonary edema or pneumothorax. No large pleural effusion. No acute osseous abnormalities are seen. IMPRESSION: 1. Endotracheal tube tip 2.6 cm from the carina. Enteric tube in place with tip below the diaphragm not included in the field of view. 2. Left lung opacities most prominent at the left lung base, may represent pneumonia, including aspiration. Electronically Signed   By: Keith Rake M.D.   On: 08/15/2021 17:03   EEG adult  Result Date: 08/17/2021 Lora Havens, MD     08/17/2021  8:45 AM Patient Name: RAFAEL MCDILL MRN: [id number] Epilepsy Attending: Lora Havens Referring Physician/Provider: Candee Furbish, MD Date: 08/17/2021 Duration: 25.12 mins Patient history: 41yo F s/p cardiac arrest. EEG to evaluate for seizure Level of alertness:  comatose AEDs during EEG study: LEV, propofol Technical aspects: This EEG study was done with scalp electrodes positioned according to the 10-20 International system of electrode placement. Electrical activity was acquired at a sampling rate of 500Hz  and reviewed with a high frequency filter of 70Hz  and a low frequency filter of 1Hz . EEG data were recorded continuously and digitally stored. Description: EEG showed continuous generalized background suppression which was not reactive to noxious stimulation. Hyperventilation and photic stimulation were not performed.   ABNORMALITY - Background suppression , generalized IMPRESSION: This study is suggestive of profound diffuse encephalopathy, nonspecific etiology but likely related to sedation, anoxic/hypoxic brain injury. No seizures or epileptiform discharges were seen throughout the recording. Priyanka Barbra Sarks   Overnight EEG with video  Result Date: 08/18/2021 Lora Havens, MD     08/25/2021 11:41 AM Patient Name: LILLIANNA EPHRAIM MRN: [id number]  Epilepsy Attending: Lora Havens Referring Physician/Provider: Candee Furbish, MD Duration: 08/17/2021 1423 to 08/18/2021 1423  Patient history: 41yo F s/p  cardiac arrest. EEG to evaluate for seizure  Level of alertness:  comatose  AEDs during EEG study: LEV, propofol  Technical aspects: This EEG study was done with scalp electrodes positioned according to the 10-20 International system of electrode placement. Electrical activity was acquired at a sampling rate of 500Hz  and reviewed with a high frequency filter of 70Hz  and a low frequency filter of 1Hz . EEG data were recorded continuously and digitally stored.  Description: EEG showed continuous generalized 3-5Hz  theta-delta slowing which at times appear sharply contoured. Hyperventilation and photic stimulation were not performed.    ABNORMALITY - Continuous slow, generalized  IMPRESSION: This study is suggestive of severe diffuse encephalopathy, nonspecific etiology but likely related to sedation, anoxic/hypoxic brain injury. No seizures or definite epileptiform discharges were seen throughout the recording.  Lora Havens   ECHOCARDIOGRAM COMPLETE  Result Date: 08/17/2021    ECHOCARDIOGRAM REPORT   Patient Name:   SHALETA PRESTAGE Date of Exam: 08/17/2021 Medical Rec #:  [id number]          Height:       63.0 in Accession #:    [id number]         Weight:       200.0 lb Date of Birth:  [birth year]           BSA:          1.934 m Patient Age:    54 years           BP:           159/102 mmHg Patient Gender: F                  HR:           68 bpm. Exam Location:  Inpatient Procedure: 2D Echo, Color Doppler and Cardiac Doppler Indications:    Cardiac Arrest i46.9  History:        Patient has prior history of Echocardiogram examinations, most                 recent 11/23/2020. Arrythmias:WPW; Risk Factors:Polysub.  Sonographer:    Raquel Sarna Senior RDCS Referring Phys: [id number] Candee Furbish  Sonographer Comments: Scanned supine on artificial respirator IMPRESSIONS  1.  Left ventricular ejection fraction, by estimation, is 50 to 55%. The left ventricle has low normal function. The left ventricle has no definite regional wall motion abnormalities in comparison with prior study. Left ventricular diastolic parameters are indeterminate.  2. Right ventricular systolic function is normal. The right ventricular size is normal. Tricuspid regurgitation signal is inadequate for assessing PA pressure.  3. The mitral valve is grossly normal. Mild to moderate mitral valve regurgitation.  4. The aortic valve is tricuspid. Aortic valve regurgitation is not visualized.  5. IVC is dilated but unable to estimate CVP on ventilator. Comparison(s): Prior images reviewed side by side. LVEF low normal range at this time. FINDINGS  Left Ventricle: Left ventricular ejection fraction, by estimation, is 50 to 55%. The left ventricle has low normal function. The left ventricle has no regional wall motion abnormalities. The left ventricular internal cavity size was normal in size. There is no left ventricular hypertrophy. Left ventricular diastolic parameters are indeterminate. Right Ventricle: The right ventricular size is normal. No increase in right ventricular wall thickness. Right ventricular systolic function is normal. Tricuspid regurgitation signal is inadequate for assessing PA pressure. Left Atrium: Left atrial size was normal in size. Right Atrium: Right atrial size was normal in size. Pericardium:  There is no evidence of pericardial effusion. Mitral Valve: The mitral valve is grossly normal. Mild to moderate mitral valve regurgitation, with posteriorly-directed jet. Tricuspid Valve: The tricuspid valve is grossly normal. Tricuspid valve regurgitation is trivial. Aortic Valve: The aortic valve is tricuspid. Aortic valve regurgitation is not visualized. Pulmonic Valve: The pulmonic valve was grossly normal. Pulmonic valve regurgitation is trivial. Aorta: The aortic root is normal in size and  structure. Venous: IVC is dilated but unable to estimate CVP on ventilator. IVC assessment for right atrial pressure unable to be performed due to mechanical ventilation. IAS/Shunts: No atrial level shunt detected by color flow Doppler.  LEFT VENTRICLE PLAX 2D LVIDd:         4.70 cm      Diastology LVIDs:         3.70 cm      LV e' medial:    5.98 cm/s LV PW:         0.70 cm      LV E/e' medial:  11.0 LV IVS:        0.70 cm      LV e' lateral:   7.83 cm/s LVOT diam:     1.90 cm      LV E/e' lateral: 8.4 LV SV:         45 LV SV Index:   23 LVOT Area:     2.84 cm  LV Volumes (MOD) LV vol d, MOD A2C: 109.0 ml LV vol d, MOD A4C: 99.1 ml LV vol s, MOD A2C: 52.0 ml LV vol s, MOD A4C: 53.9 ml LV SV MOD A2C:     57.0 ml LV SV MOD A4C:     99.1 ml LV SV MOD BP:      52.2 ml RIGHT VENTRICLE RV S prime:     10.10 cm/s TAPSE (M-mode): 1.9 cm LEFT ATRIUM             Index        RIGHT ATRIUM           Index LA diam:        2.70 cm 1.40 cm/m   RA Area:     16.70 cm LA Vol (A2C):   46.0 ml 23.79 ml/m  RA Volume:   46.80 ml  24.20 ml/m LA Vol (A4C):   28.5 ml 14.74 ml/m LA Biplane Vol: 37.3 ml 19.29 ml/m  AORTIC VALVE LVOT Vmax:   78.30 cm/s LVOT Vmean:  50.300 cm/s LVOT VTI:    0.157 m  AORTA Ao Root diam: 2.90 cm Ao Asc diam:  3.30 cm MITRAL VALVE MV Area (PHT): 3.42 cm    SHUNTS MV Decel Time: 222 msec    Systemic VTI:  0.16 m MR Peak grad: 131.3 mmHg   Systemic Diam: 1.90 cm MR Vmax:      573.00 cm/s MV E velocity: 65.60 cm/s MV A velocity: 60.80 cm/s MV E/A ratio:  1.08 Rozann Lesches MD Electronically signed by Rozann Lesches MD Signature Date/Time: 08/17/2021/11:12:47 AM    Final    [place] EKG SITE RITE  Result Date: 08/17/2021 If Site Rite image not attached, placement could not be confirmed due to current cardiac rhythm.  CT CHEST ABDOMEN PELVIS WO CONTRAST  Result Date: 09-05-2021 CLINICAL DATA:  Deceased, transplant evaluation EXAM: CT CHEST, ABDOMEN AND PELVIS WITHOUT CONTRAST TECHNIQUE: Multidetector CT  imaging of the chest, abdomen and pelvis was performed following the standard protocol without IV contrast. RADIATION DOSE REDUCTION: This exam  was performed according to the departmental dose-optimization program which includes automated exposure control, adjustment of the mA and/or kV according to patient size and/or use of iterative reconstruction technique. COMPARISON:  CT abdomen pelvis 03/09/2021, CT chest 08/21/2021 FINDINGS: CT CHEST FINDINGS Cardiovascular: Right upper extremity PICC line tip seen within the superior vena cava. Cardiac size within normal limits. No significant coronary artery calcification. No pericardial effusion. Central pulmonary arteries are of normal caliber. Thoracic aorta is unremarkable on this noncontrast examination. Mediastinum/Nodes: Endotracheal tube tip at the level of sternal notch. Nasogastric tube extends into the gastric antrum. Visualized thyroid is unremarkable. No pathologic thoracic adenopathy. Esophagus is unremarkable. Lungs/Pleura: Scattered areas of subsegmental atelectasis is noted within the upper lobes and left lower lobe. Minimal peribronchial nodularity within the superior segment the right lower lobe Musculoskeletal: Healed left second rib fracture. No acute bone abnormality. Apparent sternal fracture artifactual and related to respiratory motion. CT ABDOMEN PELVIS FINDINGS Hepatobiliary: No focal liver abnormality is seen. Status post cholecystectomy. No biliary dilatation. Pancreas: Unremarkable Spleen: Unremarkable Adrenals/Urinary Tract: The adrenal glands are unremarkable. The kidneys are unremarkable on this noncontrast examination. Foley catheter balloon seen within a decompressed bladder lumen. Stomach/Bowel: Moderate stool burden without evidence of obstruction. Mild presacral edema is nonspecific. Stomach, small bowel, and large bowel are otherwise unremarkable. Appendix normal. No free intraperitoneal gas or fluid. Vascular/Lymphatic: No  significant vascular findings are present. No enlarged abdominal or pelvic lymph nodes. Reproductive: Uterus and bilateral adnexa are unremarkable. Other: Small fat containing umbilical hernia. Subcutaneous infiltration within the anterior abdominal wall likely relates to subcutaneous injection. Musculoskeletal: No acute bone abnormality. No lytic or blastic bone lesion. IMPRESSION: No acute intrathoracic or intra-abdominal pathology identified. Incidental findings as noted above. Electronically Signed   By: Fidela Salisbury M.D.   On: 08/28/2021 19:18    Microbiology Recent Results (from the past 240 hour(s))  Resp Panel by RT-PCR (Flu A&B, Covid) Nasopharyngeal Swab     Status: None   Collection Time: 08/28/21  6:21 PM   Specimen: Nasopharyngeal Swab; Nasopharyngeal(NP) swabs in vial transport medium  Result Value Ref Range Status   SARS Coronavirus 2 by RT PCR NEGATIVE NEGATIVE Final    Comment: (NOTE) SARS-CoV-2 target nucleic acids are NOT DETECTED.  The SARS-CoV-2 RNA is generally detectable in upper respiratory specimens during the acute phase of infection. The lowest concentration of SARS-CoV-2 viral copies this assay can detect is 138 copies/mL. A negative result does not preclude SARS-Cov-2 infection and should not be used as the sole basis for treatment or other patient management decisions. A negative result may occur with  improper specimen collection/handling, submission of specimen other than nasopharyngeal swab, presence of viral mutation(s) within the areas targeted by this assay, and inadequate number of viral copies(<138 copies/mL). A negative result must be combined with clinical observations, patient history, and epidemiological information. The expected result is Negative.  Fact Sheet for Patients:  EntrepreneurPulse.com.au  Fact Sheet for Healthcare Providers:  IncredibleEmployment.be  This test is no t yet approved or cleared by  the [place] FDA and  has been authorized for detection and/or diagnosis of SARS-CoV-2 by FDA under an Emergency Use Authorization (EUA). This EUA will remain  in effect (meaning this test can be used) for the duration of the COVID-19 declaration under Section 564(b)(1) of the Act, 21 U.S.C.section 360bbb-3(b)(1), unless the authorization is terminated  or revoked sooner.       Influenza A by PCR NEGATIVE NEGATIVE Final   Influenza B by  PCR NEGATIVE NEGATIVE Final    Comment: (NOTE) The Xpert Xpress SARS-CoV-2/FLU/RSV plus assay is intended as an aid in the diagnosis of influenza from Nasopharyngeal swab specimens and should not be used as a sole basis for treatment. Nasal washings and aspirates are unacceptable for Xpert Xpress SARS-CoV-2/FLU/RSV testing.  Fact Sheet for Patients: EntrepreneurPulse.com.au  Fact Sheet for Healthcare Providers: IncredibleEmployment.be  This test is not yet approved or cleared by the [place] FDA and has been authorized for detection and/or diagnosis of SARS-CoV-2 by FDA under an Emergency Use Authorization (EUA). This EUA will remain in effect (meaning this test can be used) for the duration of the COVID-19 declaration under Section 564(b)(1) of the Act, 21 U.S.C. section 360bbb-3(b)(1), unless the authorization is terminated or revoked.  Performed at [facility], [place] [address] [place], [place] [address]   Culture, blood (Routine X 2) w Reflex to ID Panel     Status: None   Collection Time: 08/28/21  7:07 PM   Specimen: BLOOD  Result Value Ref Range Status   Specimen Description BLOOD BLOOD LEFT HAND  Final   Special Requests   Final    AEROBIC BOTTLE ONLY Blood Culture results may not be optimal due to an inadequate volume of blood received in culture bottles   Culture   Final    NO GROWTH 5 DAYS Performed at [facility], [place] [address] [place], [place] [address]    Report  Status 09/02/2021 FINAL  Final  Culture, blood (Routine X 2) w Reflex to ID Panel     Status: Abnormal   Collection Time: 08/28/21  7:07 PM   Specimen: BLOOD  Result Value Ref Range Status   Specimen Description BLOOD BLOOD LEFT HAND  Final   Special Requests   Final    BOTTLES DRAWN AEROBIC ONLY Blood Culture results may not be optimal due to an inadequate volume of blood received in culture bottles   Culture  Setup Time   Final    GRAM POSITIVE COCCI AEROBIC BOTTLE ONLY CRITICAL RESULT CALLED TO, READ BACK BY AND VERIFIED WITH: Bertram Savin [id number] @1706  FH    Culture (A)  Final    STAPHYLOCOCCUS EPIDERMIDIS THE SIGNIFICANCE OF ISOLATING THIS ORGANISM FROM A SINGLE SET OF BLOOD CULTURES WHEN MULTIPLE SETS ARE DRAWN IS UNCERTAIN. PLEASE NOTIFY THE MICROBIOLOGY DEPARTMENT WITHIN ONE WEEK IF SPECIATION AND SENSITIVITIES ARE REQUIRED. Performed at [facility], [place] [address], [place], [place] [address]    Report Status 19-Sep-2021 FINAL  Final  Blood Culture ID Panel (Reflexed)     Status: Abnormal   Collection Time: 08/28/21  7:07 PM  Result Value Ref Range Status   Enterococcus faecalis NOT DETECTED NOT DETECTED Final   Enterococcus Faecium NOT DETECTED NOT DETECTED Final   Listeria monocytogenes NOT DETECTED NOT DETECTED Final   Staphylococcus species DETECTED (A) NOT DETECTED Final    Comment: CRITICAL RESULT CALLED TO, READ BACK BY AND VERIFIED WITH: Bertram Savin [id number] @1706  FH    Staphylococcus aureus (BCID) NOT DETECTED NOT DETECTED Final   Staphylococcus epidermidis DETECTED (A) NOT DETECTED Final    Comment: Methicillin (oxacillin) resistant coagulase negative staphylococcus. Possible blood culture contaminant (unless isolated from more than one blood culture draw or clinical case suggests pathogenicity). No antibiotic treatment is indicated for blood  culture contaminants. CRITICAL RESULT CALLED TO, READ BACK BY AND VERIFIED WITH: Bertram Savin [id number] @1706  FH      Staphylococcus  lugdunensis NOT DETECTED NOT DETECTED Final   Streptococcus species NOT DETECTED NOT DETECTED Final   Streptococcus agalactiae NOT DETECTED NOT DETECTED Final   Streptococcus pneumoniae NOT DETECTED NOT DETECTED Final   Streptococcus pyogenes NOT DETECTED NOT DETECTED Final   A.calcoaceticus-baumannii NOT DETECTED NOT DETECTED Final   Bacteroides fragilis NOT DETECTED NOT DETECTED Final   Enterobacterales NOT DETECTED NOT DETECTED Final   Enterobacter cloacae complex NOT DETECTED NOT DETECTED Final   Escherichia coli NOT DETECTED NOT DETECTED Final   Klebsiella aerogenes NOT DETECTED NOT DETECTED Final   Klebsiella oxytoca NOT DETECTED NOT DETECTED Final   Klebsiella pneumoniae NOT DETECTED NOT DETECTED Final   Proteus species NOT DETECTED NOT DETECTED Final   Salmonella species NOT DETECTED NOT DETECTED Final   Serratia marcescens NOT DETECTED NOT DETECTED Final   Haemophilus influenzae NOT DETECTED NOT DETECTED Final   Neisseria meningitidis NOT DETECTED NOT DETECTED Final   Pseudomonas aeruginosa NOT DETECTED NOT DETECTED Final   Stenotrophomonas maltophilia NOT DETECTED NOT DETECTED Final   Candida albicans NOT DETECTED NOT DETECTED Final   Candida auris NOT DETECTED NOT DETECTED Final   Candida glabrata NOT DETECTED NOT DETECTED Final   Candida krusei NOT DETECTED NOT DETECTED Final   Candida parapsilosis NOT DETECTED NOT DETECTED Final   Candida tropicalis NOT DETECTED NOT DETECTED Final   Cryptococcus neoformans/gattii NOT DETECTED NOT DETECTED Final   Methicillin resistance mecA/C DETECTED (A) NOT DETECTED Final    Comment: CRITICAL RESULT CALLED TO, READ BACK BY AND VERIFIED WITH: Bertram Savin [id number] @1706  FH Performed at [facility], [address], [place], [place] [address]   Urine Culture     Status: Abnormal   Collection Time: 08/28/21 10:57 PM   Specimen: Urine, Catheterized  Result Value Ref Range Status   Specimen Description URINE,  CATHETERIZED  Final   Special Requests   Final    NONE Performed at [facility], [place] [address], [place], [place] [address]    Culture >=100,000 COLONIES/mL ENTEROCOCCUS FAECALIS (A)  Final   Report Status September 14, 2021 FINAL  Final   Organism ID, Bacteria ENTEROCOCCUS FAECALIS (A)  Final      Susceptibility   Enterococcus faecalis - MIC*    AMPICILLIN <=2 SENSITIVE Sensitive     NITROFURANTOIN <=16 SENSITIVE Sensitive     VANCOMYCIN 1 SENSITIVE Sensitive     * >=100,000 COLONIES/mL ENTEROCOCCUS FAECALIS  SARS Coronavirus 2 by RT PCR (hospital order, performed in [place] hospital lab) *cepheid single result test* Nasopharyngeal Nasopharyngeal Swab     Status: None   Collection Time: 08/30/21  3:43 PM   Specimen: Nasopharyngeal Swab  Result Value Ref Range Status   SARS Coronavirus 2 by RT PCR NEGATIVE NEGATIVE Final    Comment: (NOTE) SARS-CoV-2 target nucleic acids are NOT DETECTED.  The SARS-CoV-2 RNA is generally detectable in upper and lower respiratory specimens during the acute phase of infection. The lowest concentration of SARS-CoV-2 viral copies this assay can detect is 250 copies / mL. A negative result does not preclude SARS-CoV-2 infection and should not be used as the sole basis for treatment or other patient management decisions.  A negative result may occur with improper specimen collection / handling, submission of specimen other than nasopharyngeal swab, presence of viral mutation(s) within the areas targeted by this assay, and inadequate number of viral copies (<250 copies / mL). A negative result must be combined with clinical observations, patient history, and epidemiological information.  Fact Sheet for Patients:   https://www.patel.info/  Fact Sheet for Healthcare Providers: https://hall.com/  This test is not yet approved or  cleared by the [place] FDA and has been authorized for detection and/or  diagnosis of SARS-CoV-2 by FDA under an Emergency Use Authorization (EUA).  This EUA will remain in effect (meaning this test can be used) for the duration of the COVID-19 declaration under Section 564(b)(1) of the Act, 21 U.S.C. section 360bbb-3(b)(1), unless the authorization is terminated or revoked sooner.  Performed at [facility], [place] [address] [place], [place] [address]     Lab Basic Metabolic Panel: Recent Labs  Lab 08/29/21 0559 08/29/21 1703 08/30/21 0515 08/30/21 1700 09-01-2021 0521  NA 136 136 138 135 138  K 3.9 3.9 3.7 3.7 3.6  CL 99 98 98 97* 102  CO2 28 28 28 28 28   GLUCOSE 101* 92 89 96 91  BUN 15 13 16 15 13   CREATININE 0.41* 0.39* 0.39* 0.44 0.41*  CALCIUM 9.8 9.0 9.8 9.4 9.4  MG  --   --   --   --  1.7   Liver Function Tests: Recent Labs  Lab 08/29/21 0559 08/29/21 1703 08/30/21 0515 08/30/21 1700 Sep 01, 2021 0521  AST 27 26 27 26 27   ALT 17 15 17 14 15   ALKPHOS 123 116 122 123 137*  BILITOT 0.2* 0.4 0.3 0.5 0.4  PROT 7.3 6.8 7.4 6.7 7.1  ALBUMIN 2.8* 2.7* 2.8* 2.5* 2.6*   Recent Labs  Lab 08/28/21 1811 08/29/21 0559 08/30/21 0515 08/30/21 1700 01-Sep-2021 0521  LIPASE 45 40 42 48 48  AMYLASE 34 41 47 49 56   No results for input(s): AMMONIA in the last 168 hours. CBC: Recent Labs  Lab 08/29/21 0559 08/29/21 1703 08/30/21 0515 08/30/21 1700 09-01-21 0521  WBC 11.2* 9.9 9.2 7.3 8.8  HGB 9.2* 9.2* 9.1* 8.4* 8.8*  HCT 30.5* 29.3* 30.0* 28.3* 29.2*  MCV 76.3* 75.9* 76.3* 78.0* 77.0*  PLT 495* 480* 476* 474* 474*   Cardiac Enzymes: Recent Labs  Lab 08/28/21 1811 08/29/21 0559 08/30/21 0515 08/30/21 1700 Sep 01, 2021 0521  CKTOTAL 56 52 35* 29* 29*  CKMB 7.4* 1.5 1.2 0.7 0.8   Sepsis Labs: Recent Labs  Lab 08/29/21 1703 08/30/21 0515 08/30/21 1700 09/01/2021 0521  WBC 9.9 9.2 7.3 8.8

## 2021-09-11 NOTE — Progress Notes (Signed)
   09-22-21 1340  Clinical Encounter Type  Visited With Family  Visit Type Initial;Other (Comment) ([facility])  Referral From Nurse  Consult/Referral To Chaplain   Chaplain responded to a call from the nurse for [facility], to accompany family of patient on [facility].  I remained with family in consult room as they waited for the surgery to take place.They spoke of their fond memories of Shailey over her years of life.  Her son shared that his mother had been in the hospital several times over his life and while she struggled she always was ok in the end. He did not exhibit an understanding what was happening was a final act.   Family wrestled with the decisions made and went through the process of letting go of their loved one however the patient was not ready to let go and did not die when equipment was removed. Family is now reconnecting and trying to understand what is next.   Valerie Roys [facility]  [phone]

## 2021-09-11 NOTE — Progress Notes (Signed)
NAME:  Vicki Moore, MRN:  [id number], DOB:  01-14-1981, LOS: 36 ADMISSION DATE:  08/28/2021, CONSULTATION DATE:  09/05/2021 REFERRING MD:  Dr. Melina Copa, CHIEF COMPLAINT:  Cardiac arrest    History of Present Illness:  Vicki Moore is a 41 y.o. with a PMH WPW, Depression, anxiety, asthma and polysubstance abuse who presented to the ED via EMS after being found unresponsive by child. Per report patient was with 10yo son when she went to the bathroom and child heard a loud thud and patient was found unresponsive eon floor. On EMS arrival patient was found in PEA arrest, ACLS protocol ran and king airway placed. Unknown if patient received bystandard CPR. 25mn time between call and EMS arrival.   On ED arrival patient was seen minimally responsive (non-purposeful movement to upper extremities) with mild hypothermia and mild tachycardia. Lab work significant for K 3.0, CO2 12, Alkaline phos 172, AST 390, ALT 228, lactic 7.1, and hgb 10.5. CXR with left lung base opacities concerning for aspiration. PCCM consulted for further management and admission.   Pertinent  Medical History  WPW, Depression, anxiety, asthma and polysubstance abuse   Significant Hospital Events: Including procedures, antibiotic start and stop dates in addition to other pertinent events   5/6 admitted after out of hospital cardiac arrest  5/9 MRI c/w diffuse anoxic injury 5/11 Heavy sedation 5/15 met with family who is asking for more time.   Interim History / Subjective:  No overnight issues. Hypoglycemic and Dextrose given  Objective   Blood pressure 99/64, pulse 75, temperature 98.2 F (36.8 C), resp. rate 16, height _0  (1.6 m), weight 80.6 kg, SpO2 100 %. CVP:  [4 mmHg-6 mmHg] 6 mmHg  Vent Mode: PRVC FiO2 (%):  [40 %] 40 % Set Rate:  [16 bmp] 16 bmp Vt Set:  [410 mL] 410 mL PEEP:  [5 cmH20] 5 cmH20 Plateau Pressure:  [16 cmH20-22 cmH20] 22 cmH20   Intake/Output Summary (Last 24 hours) at 505/27/2023 1012 Last data filed at 505/27/231000 Gross per 24 hour  Intake 3785.92 ml  Output 2070 ml  Net 1715.92 ml   Filed Weights   08/29/21 0500 08/30/21 0500 [id number]  Weight: 79.8 kg 80.3 kg 80.6 kg    Examination: Intubated, sedated not responsive.  Myoclonus with stimulation   Resolved Hospital Problem list   Acute liver injury post arrest  Assessment & Plan:   Prolonged OOH PEA arrest Anoxic encephalopathy Circulatory Shock Acute hypoxemic respiratory failure, aspiration pneumonia History of polysubstance abuse Moderate Protein Calorie Malnutrition Anemia  Goals of Care  Maintain full vent and hemodyamic support.  To OR today for organ procurement.   Best Practice (right click and "Reselect all SmartList Selections" daily)  Diet/type: TF DVT prophylaxis: prophylactic heparin  GI prophylaxis: PPI Lines: N/A Foley:  Yes, and it is still needed Code Status:  DNR Last date of multidisciplinary goals of care discussion: DNR. Organ donation evaluation in process. Likely to OR today.   Critical Care Time: 3=31 minutes   The patient is critically ill due to anoxic encephalopathy, respiratory failure.  Critical care was necessary to treat or prevent imminent or life-threatening deterioration.  Critical care was time spent personally by me on the following activities: development of treatment plan with patient and/or surrogate as well as nursing, discussions with consultants, evaluation of patient's response to treatment, examination of patient, obtaining history from patient or surrogate, ordering and performing treatments and interventions, ordering and review of laboratory studies,  ordering and review of radiographic studies, pulse oximetry, re-evaluation of patient's condition and participation in multidisciplinary rounds.   Critical Care Time devoted to patient care services described in this note is 31 minutes. This time reflects time of care of this [place] . This critical care time does not reflect separately billable procedures or procedure time, teaching time or supervisory time of PA/NP/Med student/Med Resident etc but could involve care discussion time.       Spero Geralds [facility] 2021/09/20 10:12 AM  Pager: see AMION  If no response to pager , please call critical care on call (see AMION) until 7pm After 7:00 pm call Elink

## 2021-09-11 NOTE — Progress Notes (Signed)
Hypoglycemic Event  CBG: 67  Treatment: D50 25 mL (12.5 gm)  Symptoms: None  Follow-up CBG: Time:2201 CBG Result:112  Possible Reasons for Event: Unknown  Comments/MD notified: Provider will be notified    Vicki Moore

## 2021-09-11 NOTE — OR Nursing (Signed)
Patient extubated at 1526.

## 2021-09-11 NOTE — OR Nursing (Signed)
Patient did not expire within 90 minute time frame so the patient was taken back to ICU.

## 2021-09-11 DEATH — deceased

## 2022-05-12 IMAGING — CT CT ABD-PELV W/O CM
2 of 4 series · 17 of 46 positions shown, 19 images · non-contrast
Comparison: 07/02/2009

CLINICAL DATA: History of cholelithiasis with right upper quadrant
pain

EXAM:
CT ABDOMEN AND PELVIS WITHOUT CONTRAST
TECHNIQUE: Multidetector CT imaging of the abdomen and pelvis was performed
following the standard protocol without IV contrast.

[Series 2: axial st · axial · 0.80mm/px · z∈[-133,+262]mm · 14 of 87 slices shown, 16 images]
[im 4/87  soft-tissue]
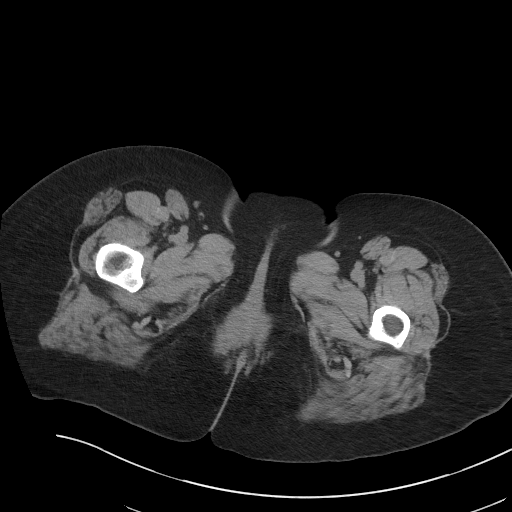
[im 4/87  bone]
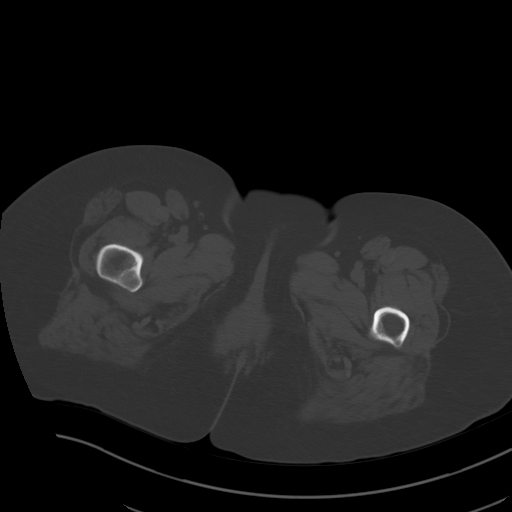
[im 11/87  soft-tissue]
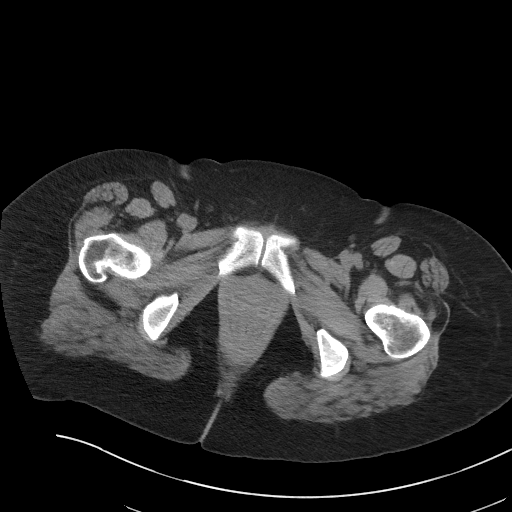
[im 18/87  soft-tissue]
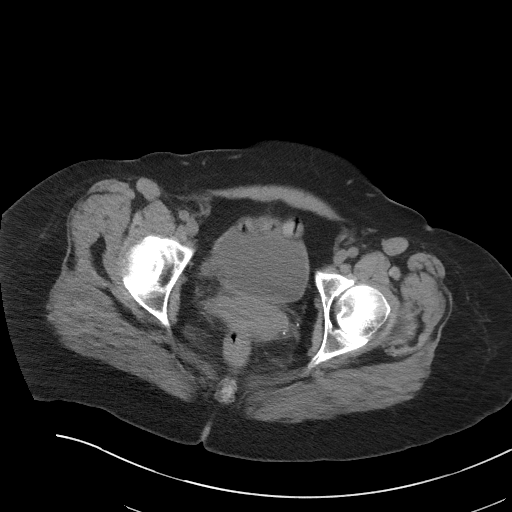
[im 22/87  soft-tissue]
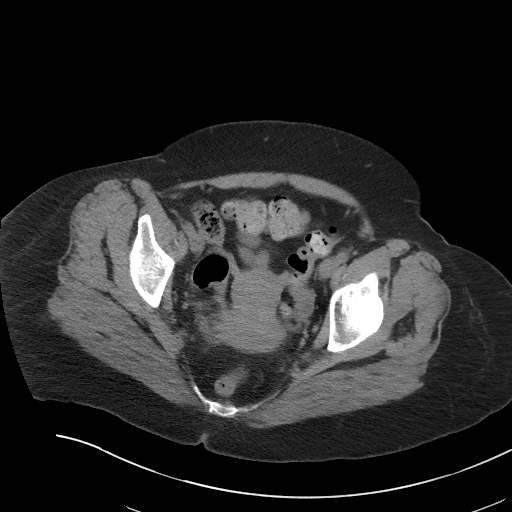
[im 29/87  soft-tissue]
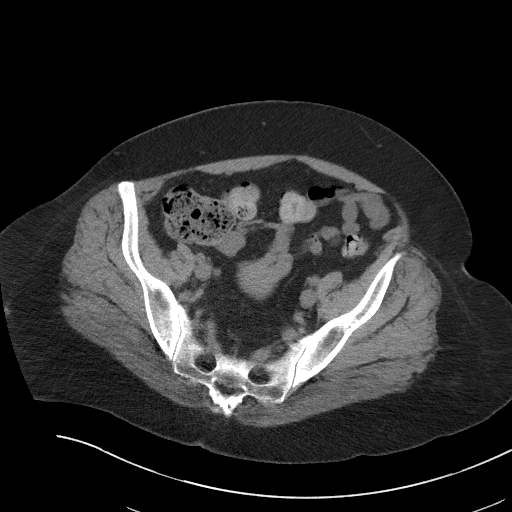
[im 36/87  soft-tissue]
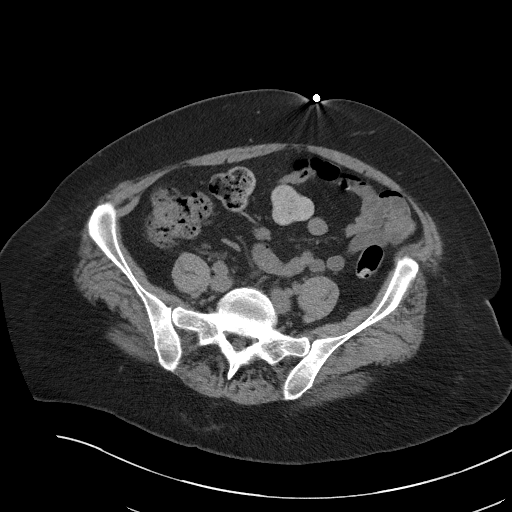
[im 40/87  soft-tissue]
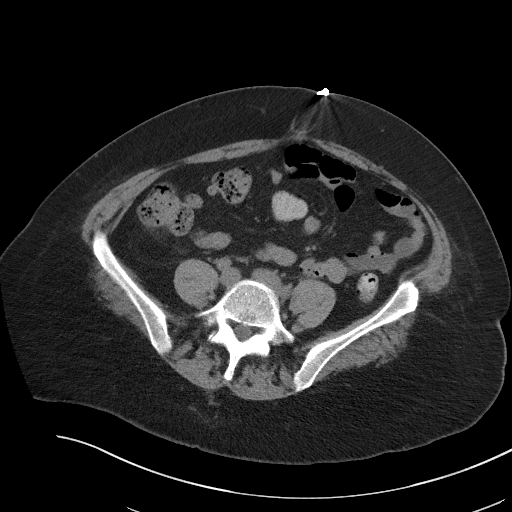
[im 47/87  soft-tissue]
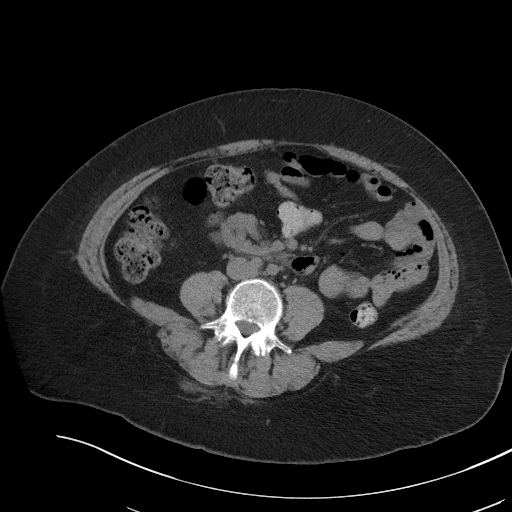
[im 51/87  soft-tissue]
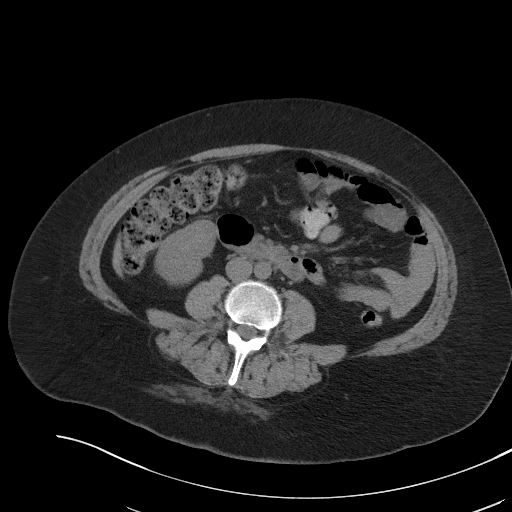
[im 51/87  bone]
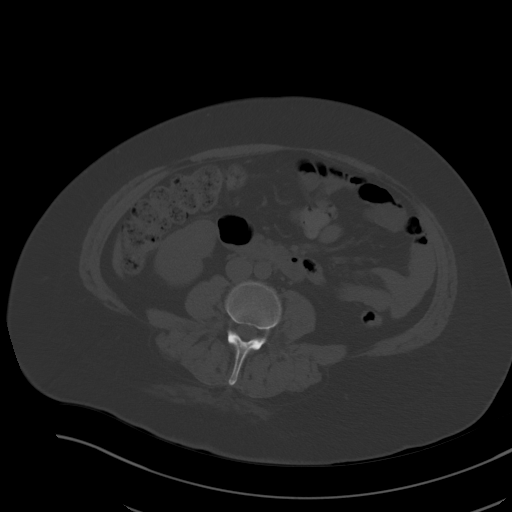
[im 58/87  soft-tissue]
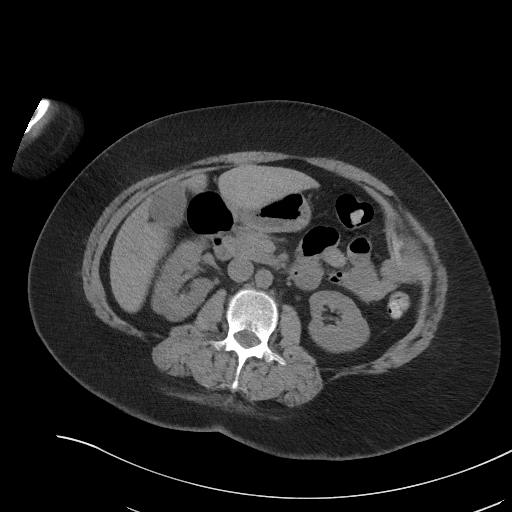
[im 65/87  soft-tissue]
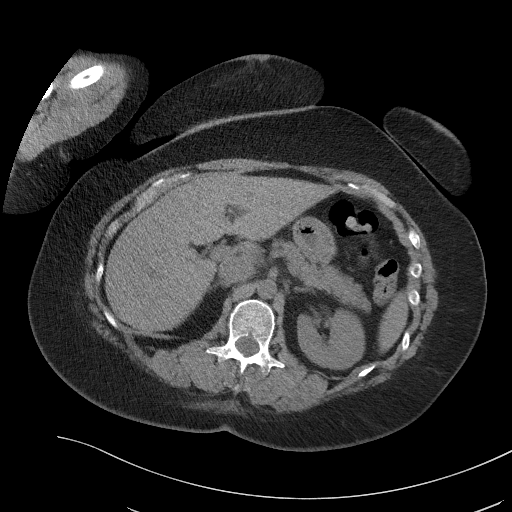
[im 69/87  soft-tissue]
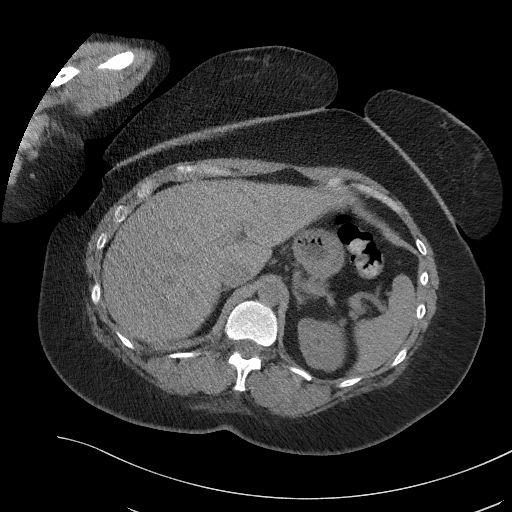
[im 76/87  soft-tissue]
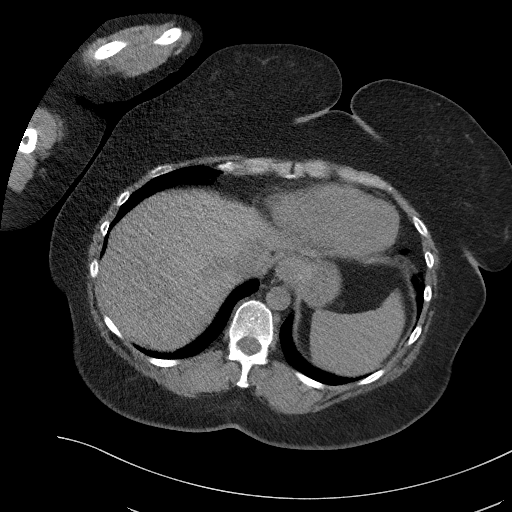
[im 83/87  soft-tissue]
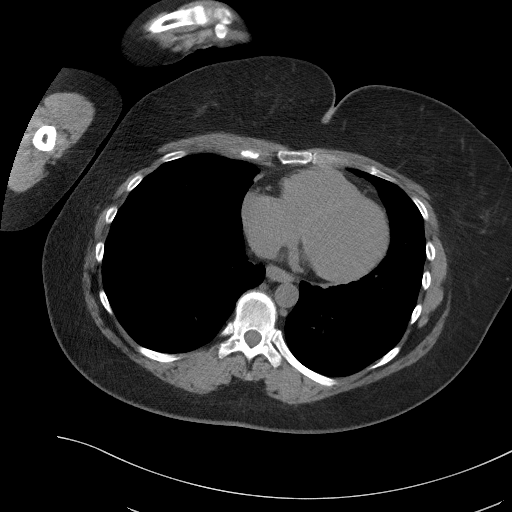

[Series 5: coronal st · coronal · 0.78mm/px · 3 of 92 slices shown]
[im 31/92  soft-tissue]
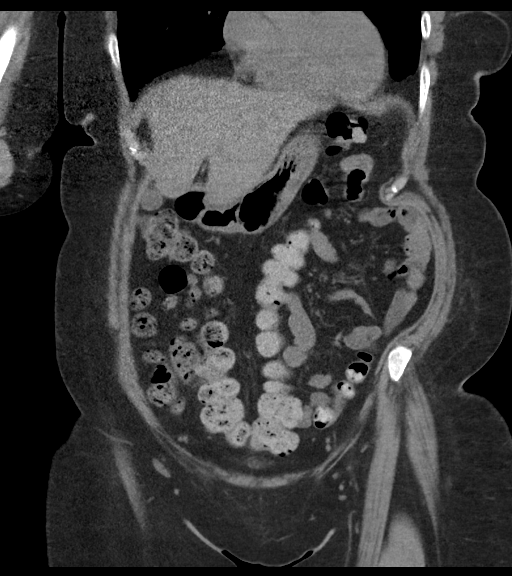
[im 41/92  soft-tissue]
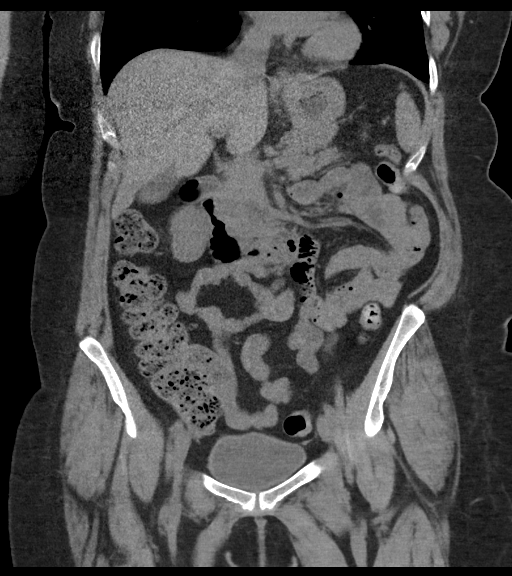
[im 51/92  soft-tissue]
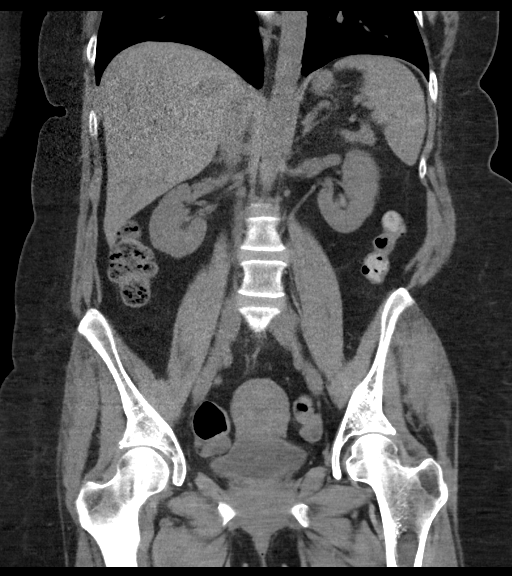

[17 of 46 positions shown; findings below may reference images not displayed]

FINDINGS: Lower chest: No acute abnormality.

Hepatobiliary: No focal liver abnormality is seen. No gallstones,
gallbladder wall thickening, or biliary dilatation.

Pancreas: Unremarkable. No pancreatic ductal dilatation or
surrounding inflammatory changes.

Spleen: Normal in size without focal abnormality.

Adrenals/Urinary Tract: Tiny nonobstructing stones are noted
bilaterally. No obstructive changes are seen. Ureters are within
normal limits. Bladder is partially distended. Adrenal glands are
within normal limits.

Stomach/Bowel: The appendix is within normal limits. No obstructive
or inflammatory changes of the colon or small bowel are seen. The
stomach is within normal limits.

Vascular/Lymphatic: No significant vascular findings are present. No
enlarged abdominal or pelvic lymph nodes.

Reproductive: Uterus and bilateral adnexa are unremarkable.

Other: No abdominal wall hernia or abnormality. No abdominopelvic
ascites.

Musculoskeletal: No acute or significant osseous findings.
IMPRESSION: Tiny nonobstructing stones bilaterally. No acute abnormality is
noted.
